# Patient Record
Sex: Female | Born: 1955
Health system: Southern US, Community
[De-identification: ages and names within clinical notes are randomized; demographics above are authoritative.]

## PROBLEM LIST (undated history)

## (undated) DIAGNOSIS — J309 Allergic rhinitis, unspecified: Secondary | ICD-10-CM

## (undated) DIAGNOSIS — E785 Hyperlipidemia, unspecified: Secondary | ICD-10-CM

## (undated) DIAGNOSIS — I213 ST elevation (STEMI) myocardial infarction of unspecified site: Secondary | ICD-10-CM

## (undated) DIAGNOSIS — K219 Gastro-esophageal reflux disease without esophagitis: Secondary | ICD-10-CM

## (undated) DIAGNOSIS — R011 Cardiac murmur, unspecified: Secondary | ICD-10-CM

## (undated) DIAGNOSIS — N390 Urinary tract infection, site not specified: Secondary | ICD-10-CM

## (undated) DIAGNOSIS — I10 Essential (primary) hypertension: Secondary | ICD-10-CM

## (undated) DIAGNOSIS — Z8489 Family history of other specified conditions: Secondary | ICD-10-CM

## (undated) DIAGNOSIS — K760 Fatty (change of) liver, not elsewhere classified: Secondary | ICD-10-CM

## (undated) DIAGNOSIS — E210 Primary hyperparathyroidism: Secondary | ICD-10-CM

## (undated) DIAGNOSIS — J45909 Unspecified asthma, uncomplicated: Secondary | ICD-10-CM

## (undated) DIAGNOSIS — I2510 Atherosclerotic heart disease of native coronary artery without angina pectoris: Secondary | ICD-10-CM

## (undated) DIAGNOSIS — E559 Vitamin D deficiency, unspecified: Secondary | ICD-10-CM

## (undated) DIAGNOSIS — J189 Pneumonia, unspecified organism: Secondary | ICD-10-CM

## (undated) DIAGNOSIS — M1990 Unspecified osteoarthritis, unspecified site: Secondary | ICD-10-CM

## (undated) DIAGNOSIS — Z87442 Personal history of urinary calculi: Secondary | ICD-10-CM

## (undated) HISTORY — DX: Hyperlipidemia, unspecified: E78.5

## (undated) HISTORY — PX: CARDIAC CATHETERIZATION: SHX172

## (undated) HISTORY — PX: TUBAL LIGATION: SHX77

## (undated) HISTORY — PX: UMBILICAL HERNIA REPAIR: SHX196

## (undated) HISTORY — PX: ROOT CANAL: SHX2363

## (undated) HISTORY — PX: CHOLECYSTECTOMY: SHX55

## (undated) HISTORY — DX: Morbid (severe) obesity due to excess calories: E66.01

## (undated) HISTORY — DX: Allergic rhinitis, unspecified: J30.9

## (undated) HISTORY — DX: Fatty (change of) liver, not elsewhere classified: K76.0

## (undated) HISTORY — DX: ST elevation (STEMI) myocardial infarction of unspecified site: I21.3

## (undated) HISTORY — DX: Urinary tract infection, site not specified: N39.0

---

## 1988-04-17 HISTORY — PX: OTHER SURGICAL HISTORY: SHX169

## 2008-03-05 DIAGNOSIS — K760 Fatty (change of) liver, not elsewhere classified: Secondary | ICD-10-CM

## 2008-03-05 HISTORY — DX: Fatty (change of) liver, not elsewhere classified: K76.0

## 2008-04-13 ENCOUNTER — Ambulatory Visit (HOSPITAL_COMMUNITY): Admission: RE | Admit: 2008-04-13 | Discharge: 2008-04-13 | Payer: Self-pay | Admitting: General Surgery

## 2008-04-13 ENCOUNTER — Encounter (INDEPENDENT_AMBULATORY_CARE_PROVIDER_SITE_OTHER): Payer: Self-pay | Admitting: General Surgery

## 2010-08-30 NOTE — Op Note (Signed)
NAME:  Alyssa Phillips, Alyssa Phillips              ACCOUNT NO.:  [id number]   MEDICAL RECORD NO.:  [id number]          PATIENT TYPE:  AMB   LOCATION:  DAY                          FACILITY:  [facility]   PHYSICIAN:  Lennie Muckle, MD      DATE OF BIRTH:  [birth year]   DATE OF PROCEDURE:  04/13/2008  DATE OF DISCHARGE:                               OPERATIVE REPORT   PREOPERATIVE DIAGNOSIS:  Cholelithiasis with history of cholecystitis.   POSTOPERATIVE DIAGNOSIS:  Cholelithiasis with history of cholecystitis.   PROCEDURE:  Laparoscopic cholecystectomy with intraoperative  cholangiogram.   SURGEON:  Amber L. Freida Busman, M.D.   No assistants.   General endotracheal anesthesia.   FINDINGS:  Patent common bile duct, right and left hepatic ducts, flow  went easily into the duodenum.  Gallbladder was sent to pathology.  Minimal amount of blood loss.  No immediate complications.   INDICATIONS FOR PROCEDURE:  Alyssa Phillips is a 55 year old female who was  seen by Dr. Almond Lint due to epigastric right upper quadrant pain.  Symptoms did seem consistent with biliary colic.  She had also had what  sounded like an acute cholecystitis episode.  She was placed on  antibiotics.  Due to Dr. Arita Miss pregnancy and delivery, I was asked to  see the patient for cholecystectomy.  Informed consent was obtained  prior to the procedure.   DETAILS OF THE PROCEDURE:  Alyssa Phillips was identified in the  preoperative holding area.  She received 2 grams of cefoxitin and was  taken to the operating room.  Once in the operating room, placed in  supine position.  After administration of general endotracheal  anesthesia, her abdomen was prepped and draped in the usual sterile  fashion.  Time-out procedure indicating the patient and procedure was  performed.  I placed an incision above the umbilicus.  Using the #10-mm  camera and 11-mm trocar, I placed the camera into the abdominal cavity  using the Optiview.  All layers of the  abdominal wall were visualized  upon entry.  After obtaining adequate pneumoperitoneum, I inspected the  abdominal cavity.  There appeared to be no evidence of injury upon  placement of the trocar.  The patient was placed in reverse  Trendelenburg, right side up.  I placed a trocar at the epigastric  region.  This was placed under visualization with the camera.  Two  additional 5-mm trocars were placed in the right side of the abdomen  under visualization with the camera.  The gallbladder was identified in  normal anatomic position.  There were omental adhesions to the  gallbladder.  This was bluntly dissected with the Maryland forceps as  well as electrocautery.  The gallbladder was grasped at the fundus and  retracted to the head of the patient.  I continued dissecting the  omentum away from the peritoneum.  I was able to fully dissect down in  the infundibulum of the gallbladder.  I identified the cystic node.  I  then carefully dissected peritoneum around the infundibulum and  identified the cystic duct and the cystic artery which was immediately  posterior medial to the cystic duct.  There appeared to be a posterior  branch of the cystic artery as well.  I clipped and divided the cystic  artery to have further length on the cystic duct.  I placed a clip  proximally on the cystic duct and partially transected with laparoscopic  scissors.  I placed a cholangiogram catheter into the cystic duct.  This  was secured with a clip.  Cholangiogram was reviewed and revealed flow  into the duodenum, right and left hepatic ducts, no evidence of  obstruction.  I then clipped the cystic duct proximally and I also  placed an Endoloop PDS around the cystic duct due to the size.  The  remaining peritoneal attachments were divided with electrocautery.  The  gallbladder was placed in an EndoCatch bag and removed through the  umbilical area.  I irrigated the abdomen with 1 liter of saline.  A  minimal  amount of oozing from the peritoneum was controlled with  electrocautery.  Final inspection revealed no bleeding.  Clips were in  place and no evidence of injury.  I closed the fascial defect above the  umbilicus with 0 Vicryl suture using a figure-of-eight fashion.  Palpation revealed good closure.  Pneumoperitoneum was released.  Trocars were removed.  Skin was closed with 4-0 Monocryl.  Dermabond was  placed as final dressing.  The patient was extubated and transferred to  the postanesthesia care unit in stable condition.  She will be  discharged home if her pain is under control and if she is keeping oral  intake.  She will follow up with me in approximately 2-3 weeks.      Lennie Muckle, MD  Electronically Signed     ALA/MEDQ  D:  04/13/2008  T:  04/14/2008  Job:  [id number]   cc:   Almond Lint, MD  [address]  [place] [address]

## 2010-10-16 HISTORY — PX: REPLACEMENT TOTAL KNEE: SUR1224

## 2010-10-25 ENCOUNTER — Other Ambulatory Visit: Payer: Self-pay | Admitting: Orthopedic Surgery

## 2010-10-25 ENCOUNTER — Other Ambulatory Visit (HOSPITAL_COMMUNITY): Payer: Self-pay | Admitting: Orthopedic Surgery

## 2010-10-25 ENCOUNTER — Encounter (HOSPITAL_COMMUNITY): Payer: Managed Care, Other (non HMO)

## 2010-10-25 ENCOUNTER — Ambulatory Visit (HOSPITAL_COMMUNITY)
Admission: RE | Admit: 2010-10-25 | Discharge: 2010-10-25 | Disposition: A | Discharge status: Home / Self Care | Payer: Managed Care, Other (non HMO) | Source: Ambulatory Visit | Attending: Orthopedic Surgery | Admitting: Orthopedic Surgery

## 2010-10-25 DIAGNOSIS — M1710 Unilateral primary osteoarthritis, unspecified knee: Secondary | ICD-10-CM | POA: Insufficient documentation

## 2010-10-25 DIAGNOSIS — IMO0002 Reserved for concepts with insufficient information to code with codable children: Secondary | ICD-10-CM | POA: Insufficient documentation

## 2010-10-25 DIAGNOSIS — J45909 Unspecified asthma, uncomplicated: Secondary | ICD-10-CM | POA: Insufficient documentation

## 2010-10-25 DIAGNOSIS — M1711 Unilateral primary osteoarthritis, right knee: Secondary | ICD-10-CM

## 2010-10-25 DIAGNOSIS — I10 Essential (primary) hypertension: Secondary | ICD-10-CM | POA: Insufficient documentation

## 2010-10-25 DIAGNOSIS — Z01810 Encounter for preprocedural cardiovascular examination: Secondary | ICD-10-CM | POA: Insufficient documentation

## 2010-10-25 DIAGNOSIS — Z01811 Encounter for preprocedural respiratory examination: Secondary | ICD-10-CM | POA: Insufficient documentation

## 2010-10-25 DIAGNOSIS — Z01812 Encounter for preprocedural laboratory examination: Secondary | ICD-10-CM | POA: Insufficient documentation

## 2010-10-25 LAB — COMPREHENSIVE METABOLIC PANEL
AST: 16 U/L (ref 0–37)
Albumin: 4 g/dL (ref 3.5–5.2)
BUN: 16 mg/dL (ref 6–23)
Calcium: 9.5 mg/dL (ref 8.4–10.5)
Creatinine, Ser: 0.65 mg/dL (ref 0.50–1.10)
Total Protein: 7.4 g/dL (ref 6.0–8.3)

## 2010-10-25 LAB — URINE MICROSCOPIC-ADD ON

## 2010-10-25 LAB — CBC
HCT: 45.1 % (ref 36.0–46.0)
MCH: 29.4 pg (ref 26.0–34.0)
MCV: 90.9 fL (ref 78.0–100.0)
Platelets: 250 10*3/uL (ref 150–400)
RDW: 13.3 % (ref 11.5–15.5)
WBC: 7.2 10*3/uL (ref 4.0–10.5)

## 2010-10-25 LAB — URINALYSIS, ROUTINE W REFLEX MICROSCOPIC
Bilirubin Urine: NEGATIVE
Glucose, UA: NEGATIVE mg/dL
Ketones, ur: NEGATIVE mg/dL
Nitrite: NEGATIVE
Specific Gravity, Urine: 1.021 (ref 1.005–1.030)
pH: 6 (ref 5.0–8.0)

## 2010-10-25 LAB — SURGICAL PCR SCREEN
MRSA, PCR: NEGATIVE
Staphylococcus aureus: POSITIVE — AB

## 2010-10-25 LAB — APTT: aPTT: 31 seconds (ref 24–37)

## 2010-10-31 ENCOUNTER — Inpatient Hospital Stay (HOSPITAL_COMMUNITY)
Admission: RE | Admit: 2010-10-31 | Discharge: 2010-11-02 | DRG: 470 | Disposition: A | Discharge status: Home Health | Payer: Managed Care, Other (non HMO) | Source: Ambulatory Visit | Attending: Orthopedic Surgery | Admitting: Orthopedic Surgery

## 2010-10-31 DIAGNOSIS — I10 Essential (primary) hypertension: Secondary | ICD-10-CM | POA: Diagnosis present

## 2010-10-31 DIAGNOSIS — J45909 Unspecified asthma, uncomplicated: Secondary | ICD-10-CM | POA: Diagnosis present

## 2010-10-31 DIAGNOSIS — Z01812 Encounter for preprocedural laboratory examination: Secondary | ICD-10-CM

## 2010-10-31 DIAGNOSIS — M1710 Unilateral primary osteoarthritis, unspecified knee: Principal | ICD-10-CM | POA: Diagnosis present

## 2010-10-31 LAB — ABO/RH: ABO/RH(D): A POS

## 2010-10-31 LAB — TYPE AND SCREEN
ABO/RH(D): A POS
Antibody Screen: NEGATIVE

## 2010-11-01 LAB — CBC
Hemoglobin: 12.7 g/dL (ref 12.0–15.0)
MCH: 30 pg (ref 26.0–34.0)
MCHC: 32.6 g/dL (ref 30.0–36.0)
Platelets: 237 10*3/uL (ref 150–400)
RDW: 13.6 % (ref 11.5–15.5)

## 2010-11-01 LAB — BASIC METABOLIC PANEL
Calcium: 8.8 mg/dL (ref 8.4–10.5)
GFR calc Af Amer: >60 mL/min (ref >60)
GFR calc non Af Amer: >60 mL/min (ref >60)
Glucose, Bld: 118 mg/dL — ABNORMAL HIGH (ref 70–99)
Sodium: 135 mEq/L (ref 135–145)

## 2010-11-02 LAB — BASIC METABOLIC PANEL
CO2: 27 mEq/L (ref 19–32)
Calcium: 9.2 mg/dL (ref 8.4–10.5)
GFR calc non Af Amer: >60 mL/min (ref >60)
Potassium: 3.7 mEq/L (ref 3.5–5.1)
Sodium: 131 mEq/L — ABNORMAL LOW (ref 135–145)

## 2010-11-02 LAB — CBC
MCH: 29.9 pg (ref 26.0–34.0)
Platelets: 215 10*3/uL (ref 150–400)
RBC: 4.11 MIL/uL (ref 3.87–5.11)

## 2010-11-04 NOTE — H&P (Signed)
NAME:  Alyssa Phillips, Alyssa Phillips              ACCOUNT NO.:  [id number]  MEDICAL RECORD NO.:  [id number]  LOCATION:                                 FACILITY:  PHYSICIAN:  Ollen Gross, M.D.    DATE OF BIRTH:  Jun 03, 1955  DATE OF ADMISSION:  10/31/2010 DATE OF DISCHARGE:                             HISTORY & PHYSICAL   CHIEF COMPLAINT:  Right knee pain.  HISTORY OF PRESENT ILLNESS:  The patient is a 55 year old female who has been seen by Dr. Lequita Halt for ongoing right knee pain.  She actually has bilateral knee arthritis, but the right is more problematic and symptomatic.  She has been treated in the past with injections including cortisone.  The right knee continues to be progressive in nature.  It is felt she would benefit from undergoing surgical intervention.  Risks and benefits have been discussed.  She elected to proceed with surgery.  She has been seen preoperatively by Dr. Foy Guadalajara and felt to be stable for surgery, did recommend monitoring her for exacerbations of her underlying hypertension and/or her asthma.  ALLERGIES/INTOLERANCES: 1. ADVIL causes hives. 2. ASPIRIN causes GI upset. 3. COMPAZINE caused her to "eyes rolled in the back of my head".  CURRENT MEDICATIONS:  Vitamin D, Tylenol, and seasonal allergy medication.  PAST MEDICAL HISTORY: 1. Asthma. 2. Past history of bronchitis. 3. Past history of pneumonia x1. 4. Hypertension. 5. Mitral valve prolapse. 6. Hiatal hernia. 7. Chronic allergies. 8. Hemorrhoids. 9. Varicose veins. 10.Urinary incontinence. 11.Osteoarthritis. 12.Postmenopausal.  PAST SURGICAL HISTORY:  Knee arthroscopy x3, gallbladder surgery, right small finger surgery, hernia repair, tubal ligation.  FAMILY HISTORY:  Father living at 31, mother living at 12, and 4 sisters all in good health.  SOCIAL HISTORY:  Married, Associate Professor.  Nonsmoker.  One glass of wine, possibly 1-2 times a month.  She does have caregivers lined up. She has 10  steps entering her home.  She does have a living will.  REVIEW OF SYSTEMS:  GENERAL:  No fever, chills, night sweats. NEUROLOGIC:  No seizures, syncope, or paralysis.  RESPIRATORY:  She gets little shortness of breath on exertion, but no shortness of breath at rest.  She does have some chronic allergies.  CARDIOVASCULAR:  No chest pain, orthopnea.  GI:  Little bit of constipation.  No nausea, vomiting, diarrhea.  GU:  Little bit of frequency, nocturia.  She does have some incontinence.  No dysuria, hematuria.  MUSCULOSKELETAL:  Knee pain.  PHYSICAL EXAMINATION:  VITAL SIGNS:  Pulse 84, respirations 14, blood pressure 158/82. GENERAL:  A 55 year old white female well nourished, well developed, overweight, obese; alert, oriented, cooperative, pleasant.  HEENT: Normocephalic, atraumatic.  Pupils round and reactive.  EOMs intact. NECK:  Supple.  No carotid bruits. CHEST:  Clear anterior and posterior chest wall.  No rhonchi, rales, or wheezing. HEART:  Regular rate and rhythm without murmur, S1 and S2 noted. ABDOMEN:  Soft, protuberant abdomen, bowel sounds present. RECTAL/BREAST/GENITALIA:  Not done, not pertinent to present illness. EXTREMITIES:  Right knee medial joint line tenderness.  There is 20 degrees, motor function intact.  Left knee, marked crepitus, no instability, no effusion.  IMPRESSION:  Osteoarthritis, right knee greater  than left knee.  PLAN:  The patient will be admitted to [facility] to undergo a right total knee replacement arthroplasty.  Surgery will be performed by Dr. Ollen Gross.     Alexzandrew L. Julien Girt, P.A.C.   ______________________________ Ollen Gross, M.D.    ALP/MEDQ  D:  10/30/2010  T:  10/31/2010  Job:  [id number]  cc:   Dr. Lenard Forth L. Foy Guadalajara, M.D. Fax: [phone]  Electronically Signed by Patrica Duel P.A.C. on 11/02/2010 10:44:17 AM Electronically Signed by Ollen Gross M.D. on 11/04/2010 06:04:42 PM

## 2010-11-04 NOTE — Op Note (Signed)
NAME:  Alyssa Phillips, Alyssa Phillips              ACCOUNT NO.:  [id number]  MEDICAL RECORD NO.:  [id number]  LOCATION:  0004                         FACILITY:  [facility]  PHYSICIAN:  Ollen Gross, M.D.    DATE OF BIRTH:  10-Mar-1956  DATE OF PROCEDURE:  10/31/2010 DATE OF DISCHARGE:                              OPERATIVE REPORT   PREOPERATIVE DIAGNOSIS:  Osteoarthritis, right knee.  POSTOPERATIVE DIAGNOSIS:  Osteoarthritis, right knee.  PROCEDURE:  Right total knee arthroplasty.  SURGEON:  Ollen Gross, M.D.  ASSISTANT:  Alexzandrew L. Perkins, P.A.C.  ANESTHESIA:  Spinal.  ESTIMATED BLOOD LOSS:  Minimal.  DRAINS:  Hemovac x1.  TOURNIQUET TIME:  39 minutes at 300 mmHg.  COMPLICATIONS:  None.  CONDITION:  Stable to recovery.  BRIEF CLINICAL NOTE:  Alyssa Phillips is a 55 year old female with advanced end- stage arthritis of the right knee with progressively worsening pain and dysfunction.  She has failed nonoperative management and presents now for right total knee arthroplasty.  PROCEDURE IN DETAIL:  After successful administration of spinal anesthetic, a tourniquet is placed high on her right thigh and her right lower extremity prepped and draped in the usual sterile fashion. Extremities wrapped in Esmarch, knee flexed, tourniquet inflated to 300 mmHg.  Midline incision was made with a 10 blade through subcutaneous tissue to the level of the extensor mechanism.  A fresh blade is used to make a medial parapatellar arthrotomy.  Soft tissue over the proximal medial tibia is subperiosteally elevated to the joint line with the knife and into the semimembranosus bursa with a Cobb elevator.  Soft tissue laterally is elevated with attention being paid to avoiding patellar tendon on tibial tubercle.  The patella was everted, knee flexed to 90 degrees and ACL and PCL removed.  Drill was used to create a starting hole in the distal femur.  The canal was thoroughly irrigated.  The 5-degree right valgus  alignment guide was placed and the distal femoral cutting block pinned to remove 11 mm off the distal femur.  Resection is made with an oscillating saw.  The tibia subluxed forward and the menisci are removed.  Extramedullary tibial alignment guide is placed, referencing proximally at the medial aspect of the tibial tubercle and distally along the second metatarsal axis and tibial crest.  Block is pinned to remove 2 mm off the more deficient medial side.  Tibial resection is made with an oscillating saw.  Size 3 is most appropriate tibial component and the proximal tibia is prepared to modular drill and keel punch for the size 3.  Femoral sizing guide is placed and size 3 is the most appropriate femoral component.  The rotation was marked off the epicondylar axis and confirmed by creating rectangular flexion gap at 90 degrees.  Size 3 cutting block is placed in this rotation and the anterior, posterior and chamfer cuts were made.  The intercondylar block is placed and that cut is made.  Trial size 3 posterior stabilized femur was placed.  10-mm posterior stabilized rotating platform insert trial was placed.  With a 10, full extension was achieved with excellent varus-valgus and anterior- posterior balance throughout.  Full range of motion.  The patella was everted and  thickness measured to be 22 mm.  Freehand resection was taken to 12 mm, 38 template is placed, lug holes were drilled, trial patella was placed and it tracks normally.  Osteophytes were removed off the posterior femur with the trial in place.  All trials are removed and the cut bone surfaces are prepared with pulsatile lavage.  Cement was mixed and once ready for implantation, the size 3 mobile bearing tibial tray, size 3 posterior stabilized femur and 38 patella are cemented into place.  The patella was held with a clamp.  Trial 10-mm inserts placed, knee held in full extension and all extruded cement removed.  When  the cement is fully hardened, then the permanent 10-mm posterior stabilized rotating platform insert was placed in the tibial tray.  Wound was copiously irrigated with saline solution and the arthrotomy closed over Hemovac drain with interrupted #1 PDS.  Flexion against gravity is 135 degrees and patella tracks normally.  The tourniquet was then released for a total time of 39 minutes.  Subcu was then closed with interrupted 2-0 Vicryl and subcuticular running 4-0 Monocryl.  The catheter for Marcaine pain pump is placed and pumps initiated.  The incisions cleaned and dried and Steri-Strips and a bulky sterile dressing were applied. She is then placed into a knee immobilizer, awakened and transported to recovery in stable condition.     Ollen Gross, M.D.     FA/MEDQ  D:  10/31/2010  T:  10/31/2010  Job:  [id number]  Electronically Signed by Ollen Gross M.D. on 11/04/2010 06:04:40 PM

## 2010-11-24 NOTE — Discharge Summary (Addendum)
Alyssa Phillips, Alyssa Phillips              ACCOUNT NO.:  [id number]  MEDICAL RECORD NO.:  [id number]  LOCATION:  1618                         FACILITY:  [facility]  PHYSICIAN:  Ollen Gross, M.D.    DATE OF BIRTH:  Sep 15, 1955  DATE OF ADMISSION:  10/31/2010 DATE OF DISCHARGE:  11/02/2010                              DISCHARGE SUMMARY   DATE OF ADMISSION:  October 31, 2010  DATE OF DISCHARGE:  November 02, 2010  ADMITTING DIAGNOSES: 1. Osteoarthritis, right knee greater than left knee. 2. Asthma. 3. History of bronchitis. 4. History of pneumonia. 5. Hypertension. 6. Mitral valve prolapse. 7. Hiatal hernia. 8. Chronic allergies. 9. Hemorrhoids. 10.Varicose veins. 11.Urinary incontinence. 12.Osteoarthritis. 13.Postmenopausal.  DISCHARGE DIAGNOSIS: 1. Osteoarthritis, right knee status post right total knee replacement     arthroplasty. 2. Asthma. 3. History of bronchitis. 4. History of pneumonia. 5. Hypertension. 6. Mitral valve prolapse. 7. Hiatal hernia. 8. Chronic allergies. 9. Hemorrhoids. 10.Varicose veins. 11.Urinary incontinence. 12.Osteoarthritis. 13.Postmenopausal. 14.Hyponatremia.  PROCEDURE:  October 31, 2010, right total knee.  SURGEON:  Ollen Gross, M.D.  ASSISTANT:  Alexzandrew L. Perkins, P.A.C.  ANESTHESIA:  Spinal anesthesia.  TOURNIQUET TIME:  39 minutes.  CONSULTS:  None.  BRIEF HISTORY:  The patient is a 55 year old female with advanced arthritis of the right knee with progressive worsening pain, dysfunction, failed nonoperative management and now presents for total knee arthroplasty.  LABORATORY DATA:  The admission CBC not scanned into the chart but preop hemoglobin was 12.3.  Serial CBCs were followed.  Hemoglobin was stable, 12.3 at time of discharge.  Chem panel on admission, all within normal limits.  Serial BMPs followed, drop in sodium from 135-131.  Admission Chem panel not scanned in the chart, but the follow-up BMET showed a drop in sodium  from 135-131.  Blood group type A+.  HOSPITAL COURSE:  The patient was admitted to the hospital, taken to OR, underwent above-stated procedure without complication.  The patient tolerated the procedure well, later transferred to the recovery room, orthopedic floor.  She had a rough night during surgery or following surgery, had some indigestion, but it was relieved with oral meds. Doing a little bit better on the morning of day one, encouraged to p.m. meds, decent output.  Hemovac drain was pulled.  Hemoglobin looked good. Encouraged incentive spirometer with a history of bronchitis, history of pneumonia in the past.  Started doing better by day two.  Back was sore, but encouraged her to get up out of bed.  Incision looked good.  She actually progressed very well with her therapy and she was seen later that afternoon and since she was meeting her goals, Dr. Lequita Halt saw her and it was decided that she would be discharged at that time.  DISCHARGE/PLAN: 1. Discharge home on 11/02/2010. 2. Discharge diagnoses, please see above. 3. Discharge meds, Robaxin, OxyIR, Xarelto.  Continue Bepreve, Colace,     hydrochlorothiazide, Nasonex Singulair, Symbicort.  And Xopenex.  DIET:  Heart-healthy diet.  ACTIVITY:  Weight-bear as tolerated, total knee protocol.  Home health PT.  FOLLOWUP:  Follow-up 2 weeks.  DISPOSITION:  Home.  CONDITION ON DISCHARGE:  Improved.     Alexzandrew L. Julien Girt, P.A.C.  ______________________________ Ollen Gross, M.D.    ALP/MEDQ  D:  11/17/2010  T:  11/18/2010  Job:  [id number]  cc:   Dr. Lenard Forth L. Foy Guadalajara, M.D. Fax: [phone]  Electronically Signed by Patrica Duel P.A.C. on 11/24/2010 07:40:34 AM Electronically Signed by Ollen Gross M.D. on 11/28/2010 11:35:09 AM

## 2011-01-20 LAB — COMPREHENSIVE METABOLIC PANEL
ALT: 20 U/L (ref 0–35)
AST: 17 U/L (ref 0–37)
Alkaline Phosphatase: 53 U/L (ref 39–117)
CO2: 29 mEq/L (ref 19–32)
Chloride: 106 mEq/L (ref 96–112)
GFR calc Af Amer: >60 mL/min (ref >60)
GFR calc non Af Amer: >60 mL/min (ref >60)
Sodium: 141 mEq/L (ref 135–145)
Total Bilirubin: 0.7 mg/dL (ref 0.3–1.2)

## 2011-01-20 LAB — DIFFERENTIAL
Basophils Absolute: 0.1 10*3/uL (ref 0.0–0.1)
Eosinophils Absolute: 0.2 10*3/uL (ref 0.0–0.7)
Eosinophils Relative: 3 % (ref 0–5)

## 2011-01-20 LAB — CBC
MCV: 88.6 fL (ref 78.0–100.0)
RBC: 4.98 MIL/uL (ref 3.87–5.11)
WBC: 8.1 10*3/uL (ref 4.0–10.5)

## 2011-09-12 ENCOUNTER — Other Ambulatory Visit (HOSPITAL_COMMUNITY)
Admission: RE | Admit: 2011-09-12 | Discharge: 2011-09-12 | Disposition: A | Discharge status: Home / Self Care | Payer: Managed Care, Other (non HMO) | Source: Ambulatory Visit | Attending: Obstetrics and Gynecology | Admitting: Obstetrics and Gynecology

## 2011-09-12 ENCOUNTER — Other Ambulatory Visit: Payer: Self-pay | Admitting: Obstetrics and Gynecology

## 2011-09-12 DIAGNOSIS — Z01419 Encounter for gynecological examination (general) (routine) without abnormal findings: Secondary | ICD-10-CM | POA: Insufficient documentation

## 2011-09-12 DIAGNOSIS — Z1159 Encounter for screening for other viral diseases: Secondary | ICD-10-CM | POA: Insufficient documentation

## 2011-09-20 ENCOUNTER — Other Ambulatory Visit: Payer: Self-pay | Admitting: Obstetrics and Gynecology

## 2011-09-20 DIAGNOSIS — Z1231 Encounter for screening mammogram for malignant neoplasm of breast: Secondary | ICD-10-CM

## 2011-10-02 ENCOUNTER — Ambulatory Visit
Admission: RE | Admit: 2011-10-02 | Discharge: 2011-10-02 | Disposition: A | Discharge status: Home / Self Care | Payer: Managed Care, Other (non HMO) | Source: Ambulatory Visit | Attending: Obstetrics and Gynecology | Admitting: Obstetrics and Gynecology

## 2011-10-02 DIAGNOSIS — Z1231 Encounter for screening mammogram for malignant neoplasm of breast: Secondary | ICD-10-CM

## 2012-04-08 ENCOUNTER — Other Ambulatory Visit: Payer: Self-pay | Admitting: Orthopedic Surgery

## 2012-04-08 MED ORDER — BUPIVACAINE LIPOSOME 1.3 % IJ SUSP: 20.0000 mL | Freq: Once | INTRAMUSCULAR | Status: DC

## 2012-04-08 MED ORDER — DEXAMETHASONE SODIUM PHOSPHATE 10 MG/ML IJ SOLN: 10.0000 mg | Freq: Once | INTRAMUSCULAR | Status: DC

## 2012-04-08 NOTE — Progress Notes (Signed)
Preoperative surgical orders have been place into the Epic hospital system for Alyssa Phillips on 04/08/2012, 1:07 PM  by Patrica Duel for surgery on 05/10/2012.  Preop Total Knee orders including Experal, IV Tylenol, and IV Decadron as long as there are no contraindications to the above medications. Avel Peace, PA-C

## 2012-04-29 ENCOUNTER — Encounter (HOSPITAL_COMMUNITY): Payer: Self-pay | Admitting: Pharmacy Technician

## 2012-05-02 ENCOUNTER — Encounter (HOSPITAL_COMMUNITY)
Admission: RE | Admit: 2012-05-02 | Discharge: 2012-05-02 | Disposition: A | Discharge status: Home / Self Care | Payer: Managed Care, Other (non HMO) | Source: Ambulatory Visit | Attending: Orthopedic Surgery | Admitting: Orthopedic Surgery

## 2012-05-02 ENCOUNTER — Encounter (HOSPITAL_COMMUNITY): Payer: Self-pay

## 2012-05-02 ENCOUNTER — Ambulatory Visit (HOSPITAL_COMMUNITY)
Admission: RE | Admit: 2012-05-02 | Discharge: 2012-05-02 | Disposition: A | Discharge status: Home / Self Care | Payer: Managed Care, Other (non HMO) | Source: Ambulatory Visit | Attending: Orthopedic Surgery | Admitting: Orthopedic Surgery

## 2012-05-02 DIAGNOSIS — Z01810 Encounter for preprocedural cardiovascular examination: Secondary | ICD-10-CM | POA: Insufficient documentation

## 2012-05-02 DIAGNOSIS — Z01812 Encounter for preprocedural laboratory examination: Secondary | ICD-10-CM | POA: Insufficient documentation

## 2012-05-02 DIAGNOSIS — M1710 Unilateral primary osteoarthritis, unspecified knee: Secondary | ICD-10-CM | POA: Insufficient documentation

## 2012-05-02 DIAGNOSIS — J45909 Unspecified asthma, uncomplicated: Secondary | ICD-10-CM | POA: Insufficient documentation

## 2012-05-02 HISTORY — PX: KNEE ARTHROSCOPY: SHX127

## 2012-05-02 HISTORY — DX: Unspecified osteoarthritis, unspecified site: M19.90

## 2012-05-02 HISTORY — DX: Cardiac murmur, unspecified: R01.1

## 2012-05-02 HISTORY — DX: Essential (primary) hypertension: I10

## 2012-05-02 HISTORY — DX: Unspecified asthma, uncomplicated: J45.909

## 2012-05-02 LAB — URINALYSIS, ROUTINE W REFLEX MICROSCOPIC
Bilirubin Urine: NEGATIVE
Glucose, UA: NEGATIVE mg/dL
Hgb urine dipstick: NEGATIVE
Ketones, ur: NEGATIVE mg/dL
Protein, ur: NEGATIVE mg/dL

## 2012-05-02 LAB — CBC
HCT: 45 % (ref 36.0–46.0)
Hemoglobin: 15 g/dL (ref 12.0–15.0)
RBC: 4.98 MIL/uL (ref 3.87–5.11)

## 2012-05-02 LAB — APTT: aPTT: 29 seconds (ref 24–37)

## 2012-05-02 LAB — COMPREHENSIVE METABOLIC PANEL
ALT: 15 U/L (ref 0–35)
Alkaline Phosphatase: 61 U/L (ref 39–117)
CO2: 27 mEq/L (ref 19–32)
GFR calc Af Amer: >90 mL/min (ref >90)
GFR calc non Af Amer: >90 mL/min (ref >90)
Glucose, Bld: 85 mg/dL (ref 70–99)
Potassium: 3.6 mEq/L (ref 3.5–5.1)
Sodium: 139 mEq/L (ref 135–145)
Total Bilirubin: 0.3 mg/dL (ref 0.3–1.2)

## 2012-05-02 LAB — SURGICAL PCR SCREEN: Staphylococcus aureus: POSITIVE — AB

## 2012-05-02 NOTE — Pre-Procedure Instructions (Signed)
1`-16-14 EKG/ CXR done today. 05-02-12 1615 Pt. Notified per phone of Positive Staph aureus PCR screen, will use Mupirocin as directed. W. Kennon Portela

## 2012-05-02 NOTE — Patient Instructions (Addendum)
[address]  05/02/2012   Your procedure is scheduled on: 1-24  -2014  Report to [facility] at  1130      AM.  Call this number if you have problems the morning of surgery: [phone]  Or Presurgical Testing [phone](Adwoa Axe)   Remember: Follow any bowel prep instructions per MD office. For Cpap use: Bring mask and tubing only.   Do not eat food:After Midnight.  May have clear liquids:up to 6 Hours before arrival. Nothing after : 0700 AM  Clear liquids include soda, tea, black coffee, apple or grape juice, broth.  Take these medicines the morning of surgery with A SIP OF WATER: Tylenol. Tramadol. Use and bring Symbicort, Nasonex. Bepreve eye drops.   Do not wear jewelry, make-up or nail polish.  Do not wear lotions, powders, or perfumes. You may wear deodorant.  Do not shave 12 hours prior to first CHG shower(legs and under arms).(face and neck okay.)  Do not bring valuables to the hospital.  Contacts, dentures or bridgework,body piercing,  may not be worn into surgery.  Leave suitcase in the car. After surgery it may be brought to your room.  For patients admitted to the hospital, checkout time is 11:00 AM the day of discharge.   Patients discharged the day of surgery will not be allowed to drive home. Must have responsible person with you x 24 hours once discharged.  Name and phone number of your driver: Miliyah Luper, spouse [phone] cell  Special Instructions: CHG Shower Use Special Wash: see special instructions.(avoid face and genitals)   Please read over the following fact sheets that you were given: MRSA Information, Blood Transfusion fact sheet, Incentive Spirometry Instruction.    Failure to follow these instructions may result in Cancellation of your surgery.   Patient signature_______________________________________________________

## 2012-05-09 ENCOUNTER — Other Ambulatory Visit: Payer: Self-pay | Admitting: Orthopedic Surgery

## 2012-05-09 NOTE — H&P (Signed)
Alyssa Phillips  DOB: 06-Feb-1956 Married / Language: English / Race: White Female  Date of Admission:  05/10/2012  Chief Complaint:  Left Knee Pain  History of Present Illness The patient is a 57 year old female who comes in for a preoperative History and Physical. The patient is scheduled for a left total knee arthroplasty to be performed by Dr. Gus Rankin. Aluisio, MD at [facility] on 05/10/2012. The patient is a 57 year old female who presents for follow up of their knee. The patient is being followed for their left knee pain. They are out from a cortisone injection. Symptoms reported today include: pain (on the anterior side. She also has burning.). The patient feels that they are doing poorly (Patient did not get any relief from the injection. The patient states the left knee is getting progressively worse.The right knee continues to do great. She is not having any problems with that unless she bumps it against something. She is now ready to get the knee replaced. They have been treated conservatively in the past for the above stated problem and despite conservative measures, they continue to have progressive pain and severe functional limitations and dysfunction. They have failed non-operative management including home exercise, medications, and injections. It is felt that they would benefit from undergoing total joint replacement. Risks and benefits of the procedure have been discussed with the patient and they elect to proceed with surgery. There are no active contraindications to surgery such as ongoing infection or rapidly progressive neurological disease.  Problem List Primary osteoarthritis of one knee (715.16) S/P Right total knee arthroplasty (V43.65)   Allergies COMPAZINE. 02/02/2009 Aspirin Low Dose *ANALGESICS - NonNarcotic*. GI Upset Advil *ANALGESICS - ANTI-INFLAMMATORY*. Hives.   Family History Osteoarthritis. grandmother mothers side and  grandfather mothers side Hypertension. mother Severe allergy. child Cerebrovascular Accident. grandfather mothers side Cancer. mother, grandmother mothers side and grandfather mothers side Diabetes Mellitus. child Heart Disease. grandfather mothers side Drug / Alcohol Addiction. father Father. Living, Dementia. age 69 Mother. Living, In stable health. 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 married Number of flights of stairs before winded. 2-3 Previously in rehab. no Pain Contract. no Drug/Alcohol Rehab (Previously). no Drug/Alcohol Rehab (Currently). no Exercise. Exercises daily; does running / walking, other and gym / weights Living situation. live with spouse Illicit drug use. no Tobacco / smoke exposure. no Tobacco use. Never smoker. never smoker Current work status. working full time Children. 3 Alcohol use. current drinker; drinks wine; only occasionally per week Post-Surgical Plans. Plan is to go home.   Medication History Montelukast Sodium ( Oral) Specific dose unknown - Active. TraMADol HCl (50MG  Tablet, Oral) Active. Methocarbamol ( Oral) Specific dose unknown - Active. Tylenol Extra Strength (500MG  Tablet, Oral) Active. Hydrochlorothiazide ( Oral) Specific dose unknown - Active. Vitamin D (50000UNIT Capsule, Oral) Active. Symbicort ( Inhalation) Specific dose unknown - Active. Nasonex (50MCG/ACT Suspension, Nasal) Active. ZyrTEC Childrens Allergy (5MG  Tablet Chewable, Oral) Active.   Past Surgical History Arthroscopy of Knee. bilateral Inguinal Hernia Repair. laparoscopic: left Gallbladder Surgery. laporoscopic Tubal Ligation Total Knee Replacement. 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 Past History Hiatal Hernia Hemorrhoids Varicose veins Urinary  Incontinence Menopause Mitral Valve Prolapse   Review of Systems General:Not Present- Chills, Fever, Night Sweats, Fatigue, Weight Gain, Weight Loss and Memory Loss. Skin:Not Present- Hives, Itching, Rash, Eczema and Lesions. HEENT:Not  Present- Tinnitus, Headache, Double Vision, Visual Loss, Hearing Loss and Dentures. Respiratory:Not Present- Shortness of breath with exertion, Shortness of breath at rest, Allergies, Coughing up blood and Chronic Cough. Cardiovascular:Not Present- Chest Pain, Racing/skipping heartbeats, Difficulty Breathing Lying Down, Murmur, Swelling and Palpitations. Gastrointestinal:Not Present- Bloody Stool, Heartburn, Abdominal Pain, Vomiting, Nausea, Constipation, Diarrhea, Difficulty Swallowing, Jaundice and Loss of appetitie. Female Genitourinary:Present- Urinating at Night. Not Present- Blood in Urine, Urinary frequency, Weak urinary stream, Discharge, Flank Pain, Incontinence, Painful Urination, Urgency and Urinary Retention. Musculoskeletal:Present- Joint Pain. Not Present- Muscle Weakness, Muscle Pain, Joint Swelling, Back Pain, Morning Stiffness and Spasms. Neurological:Not Present- Tremor, Dizziness, Blackout spells, Paralysis, Difficulty with balance and Weakness. Psychiatric:Not Present- Insomnia.   Vitals Weight: 300 lb Height: 69 in Weight was reported by patient. Height was reported by patient. Body Surface Area: 2.57 m Body Mass Index: 44.3 kg/m Pulse: 68 (Regular) Resp.: 16 (Unlabored) BP: 132/84 (Sitting, Left Arm, Standard)   Physical Exam The physical exam findings are as follows:  Note: Patient is a a 57 year old female with continued knee pain.   General Mental Status - Alert, cooperative and good historian. General Appearance- pleasant. Not in acute distress. Orientation- Oriented X3. Build & Nutrition- Well nourished and Well developed.   Head and Neck Head- normocephalic, atraumatic . Neck Global  Assessment- supple. no bruit auscultated on the right and no bruit auscultated on the left.   Eye Pupil- Bilateral- Regular and Round. Motion- Bilateral- EOMI.   Chest and Lung Exam Auscultation: Breath sounds:- clear at anterior chest wall and - clear at posterior chest wall. Adventitious sounds:- No Adventitious sounds.   Cardiovascular Auscultation:Rhythm- Regular rate and rhythm. Heart Sounds- S1 WNL and S2 WNL. Murmurs & Other Heart Sounds:Auscultation of the heart reveals - No Murmurs.   Abdomen Inspection:Contour- Generalized moderate distention. Palpation/Percussion:Tenderness- Abdomen is non-tender to palpation. Rigidity (guarding)- Abdomen is soft. Auscultation:Auscultation of the abdomen reveals - Bowel sounds normal.   Female Genitourinary Not done, not pertinent to present illness  Musculoskeletal  On exam well developed female, alert and oriented in no apparent distress. Evaluation of her left knee shows range about 5 to 125. Marked crepitus on range of motion. Tender medial greater than lateral. No instability. The right knee looks fantastic. Range 0 to 130. No swelling, tenderness, or instability.  RADIOGRAPHS: Radiographs show the right total knee is in excellent position. No periprosthetic abnormalities. The left knee shows bone on bone arthritis of the medial and patellofemoral compartments.  Assessment & Plan Primary osteoarthritis of one knee (715.16) Impression: Left Knee  Note: Plan is for a Left Total Knee Replacement by Dr. Lequita Halt.  Plan is to go home.  PCP - Dr. Marinda Elk - Patient has been seen preoperatively and felt to be stable for surgery. Preop clearance letter stated that airway management my be complicated by her allergies.  Signed electronically by Roberts Gaudy, PA-C

## 2012-05-10 ENCOUNTER — Encounter (HOSPITAL_COMMUNITY)
Admission: RE | Disposition: A | Discharge status: Home Health | Payer: Self-pay | Source: Ambulatory Visit | Attending: Orthopedic Surgery

## 2012-05-10 ENCOUNTER — Inpatient Hospital Stay (HOSPITAL_COMMUNITY)
Admission: RE | Admit: 2012-05-10 | Discharge: 2012-05-12 | DRG: 470 | Disposition: A | Discharge status: Home Health | Payer: Managed Care, Other (non HMO) | Source: Ambulatory Visit | Attending: Orthopedic Surgery | Admitting: Orthopedic Surgery

## 2012-05-10 ENCOUNTER — Encounter (HOSPITAL_COMMUNITY): Payer: Self-pay | Admitting: Anesthesiology

## 2012-05-10 ENCOUNTER — Ambulatory Visit (HOSPITAL_COMMUNITY): Payer: Managed Care, Other (non HMO) | Admitting: Anesthesiology

## 2012-05-10 ENCOUNTER — Encounter (HOSPITAL_COMMUNITY): Payer: Self-pay | Admitting: *Deleted

## 2012-05-10 DIAGNOSIS — Z8719 Personal history of other diseases of the digestive system: Secondary | ICD-10-CM

## 2012-05-10 DIAGNOSIS — Z886 Allergy status to analgesic agent status: Secondary | ICD-10-CM

## 2012-05-10 DIAGNOSIS — Z96659 Presence of unspecified artificial knee joint: Secondary | ICD-10-CM

## 2012-05-10 DIAGNOSIS — M1710 Unilateral primary osteoarthritis, unspecified knee: Principal | ICD-10-CM | POA: Diagnosis present

## 2012-05-10 DIAGNOSIS — Z6841 Body Mass Index (BMI) 40.0 and over, adult: Secondary | ICD-10-CM

## 2012-05-10 DIAGNOSIS — Z888 Allergy status to other drugs, medicaments and biological substances status: Secondary | ICD-10-CM

## 2012-05-10 DIAGNOSIS — IMO0002 Reserved for concepts with insufficient information to code with codable children: Secondary | ICD-10-CM | POA: Diagnosis present

## 2012-05-10 DIAGNOSIS — J45909 Unspecified asthma, uncomplicated: Secondary | ICD-10-CM | POA: Diagnosis present

## 2012-05-10 DIAGNOSIS — I059 Rheumatic mitral valve disease, unspecified: Secondary | ICD-10-CM | POA: Diagnosis present

## 2012-05-10 DIAGNOSIS — Z9089 Acquired absence of other organs: Secondary | ICD-10-CM

## 2012-05-10 DIAGNOSIS — K449 Diaphragmatic hernia without obstruction or gangrene: Secondary | ICD-10-CM | POA: Diagnosis present

## 2012-05-10 DIAGNOSIS — I10 Essential (primary) hypertension: Secondary | ICD-10-CM | POA: Diagnosis present

## 2012-05-10 HISTORY — PX: TOTAL KNEE ARTHROPLASTY: SHX125

## 2012-05-10 LAB — TYPE AND SCREEN: ABO/RH(D): A POS

## 2012-05-10 SURGERY — ARTHROPLASTY, KNEE, TOTAL
Anesthesia: Spinal | Site: Knee | Laterality: Left | Wound class: Clean

## 2012-05-10 MED ORDER — ACETAMINOPHEN 10 MG/ML IV SOLN
1000.0000 mg | Freq: Four times a day (QID) | INTRAVENOUS | Status: AC
  Administered 2012-05-10 – 2012-05-11 (×4): 1000 mg via INTRAVENOUS
  Filled 2012-05-10 (×6): qty 100

## 2012-05-10 MED ORDER — BISACODYL 10 MG RE SUPP: 10.0000 mg | Freq: Every day | RECTAL | Status: DC | PRN

## 2012-05-10 MED ORDER — ACETAMINOPHEN 10 MG/ML IV SOLN
INTRAVENOUS | Status: AC
  Filled 2012-05-10: qty 100

## 2012-05-10 MED ORDER — HYDROCHLOROTHIAZIDE 25 MG PO TABS
25.0000 mg | ORAL_TABLET | Freq: Every day | ORAL | Status: DC
  Administered 2012-05-10 – 2012-05-12 (×3): 25 mg via ORAL
  Filled 2012-05-10 (×3): qty 1

## 2012-05-10 MED ORDER — DOCUSATE SODIUM 100 MG PO CAPS
100.0000 mg | ORAL_CAPSULE | Freq: Two times a day (BID) | ORAL | Status: DC
  Administered 2012-05-10 – 2012-05-12 (×4): 100 mg via ORAL

## 2012-05-10 MED ORDER — PHENOL 1.4 % MT LIQD: 1.0000 | OROMUCOSAL | Status: DC | PRN

## 2012-05-10 MED ORDER — BUPIVACAINE LIPOSOME 1.3 % IJ SUSP
20.0000 mL | Freq: Once | INTRAMUSCULAR | Status: DC
  Filled 2012-05-10: qty 20

## 2012-05-10 MED ORDER — ACETAMINOPHEN 325 MG PO TABS: 650.0000 mg | ORAL_TABLET | Freq: Four times a day (QID) | ORAL | Status: DC | PRN

## 2012-05-10 MED ORDER — METOCLOPRAMIDE HCL 5 MG/ML IJ SOLN: 5.0000 mg | Freq: Three times a day (TID) | INTRAMUSCULAR | Status: DC | PRN

## 2012-05-10 MED ORDER — FLUTICASONE PROPIONATE 50 MCG/ACT NA SUSP
2.0000 | Freq: Every day | NASAL | Status: DC
  Administered 2012-05-11 – 2012-05-12 (×2): 2 via NASAL
  Filled 2012-05-10: qty 16

## 2012-05-10 MED ORDER — LACTATED RINGERS IV SOLN
INTRAVENOUS | Status: DC
  Administered 2012-05-10: 15:00:00 via INTRAVENOUS
  Administered 2012-05-10: 1000 mL via INTRAVENOUS
  Administered 2012-05-10: 13:00:00 via INTRAVENOUS

## 2012-05-10 MED ORDER — TRAMADOL HCL 50 MG PO TABS: 50.0000 mg | ORAL_TABLET | Freq: Four times a day (QID) | ORAL | Status: DC | PRN

## 2012-05-10 MED ORDER — SODIUM CHLORIDE 0.9 % IJ SOLN
INTRAMUSCULAR | Status: DC | PRN
  Administered 2012-05-10: 14:00:00

## 2012-05-10 MED ORDER — LORATADINE 10 MG PO TABS
10.0000 mg | ORAL_TABLET | Freq: Every day | ORAL | Status: DC
  Administered 2012-05-11: 10 mg via ORAL
  Filled 2012-05-10 (×2): qty 1

## 2012-05-10 MED ORDER — PHENYLEPHRINE HCL 10 MG/ML IJ SOLN
10.0000 mg | INTRAVENOUS | Status: DC | PRN
  Administered 2012-05-10: 15 ug/min via INTRAVENOUS

## 2012-05-10 MED ORDER — DEXAMETHASONE SODIUM PHOSPHATE 10 MG/ML IJ SOLN: 10.0000 mg | Freq: Once | INTRAMUSCULAR | Status: AC

## 2012-05-10 MED ORDER — BEPOTASTINE BESILATE 1.5 % OP SOLN: 1.0000 [drp] | Freq: Every day | OPHTHALMIC | Status: DC | PRN

## 2012-05-10 MED ORDER — RIVAROXABAN 10 MG PO TABS
10.0000 mg | ORAL_TABLET | Freq: Every day | ORAL | Status: DC
  Administered 2012-05-11 – 2012-05-12 (×2): 10 mg via ORAL
  Filled 2012-05-10 (×3): qty 1

## 2012-05-10 MED ORDER — FLEET ENEMA 7-19 GM/118ML RE ENEM: 1.0000 | ENEMA | Freq: Once | RECTAL | Status: AC | PRN

## 2012-05-10 MED ORDER — FENTANYL CITRATE 0.05 MG/ML IJ SOLN
INTRAMUSCULAR | Status: DC | PRN
  Administered 2012-05-10: 100 ug via INTRAVENOUS

## 2012-05-10 MED ORDER — DEXTROSE 5 % IV SOLN
3.0000 g | INTRAVENOUS | Status: AC
  Administered 2012-05-10: 3 g via INTRAVENOUS
  Filled 2012-05-10: qty 3000

## 2012-05-10 MED ORDER — ONDANSETRON HCL 4 MG PO TABS: 4.0000 mg | ORAL_TABLET | Freq: Four times a day (QID) | ORAL | Status: DC | PRN

## 2012-05-10 MED ORDER — PROPOFOL 10 MG/ML IV BOLUS
INTRAVENOUS | Status: DC | PRN
  Administered 2012-05-10: 50 mg via INTRAVENOUS
  Administered 2012-05-10: 30 mg via INTRAVENOUS

## 2012-05-10 MED ORDER — LEVOCETIRIZINE DIHYDROCHLORIDE 5 MG PO TABS: 5.0000 mg | ORAL_TABLET | Freq: Every evening | ORAL | Status: DC

## 2012-05-10 MED ORDER — DEXAMETHASONE 6 MG PO TABS
10.0000 mg | ORAL_TABLET | Freq: Once | ORAL | Status: AC
  Administered 2012-05-11: 10 mg via ORAL
  Filled 2012-05-10: qty 1

## 2012-05-10 MED ORDER — CEFAZOLIN SODIUM 1-5 GM-% IV SOLN
INTRAVENOUS | Status: AC
  Filled 2012-05-10: qty 50

## 2012-05-10 MED ORDER — PROPOFOL 10 MG/ML IV EMUL
INTRAVENOUS | Status: DC | PRN
  Administered 2012-05-10: 75 ug/kg/min via INTRAVENOUS

## 2012-05-10 MED ORDER — SODIUM CHLORIDE 0.9 % IV SOLN: INTRAVENOUS | Status: DC

## 2012-05-10 MED ORDER — CHLORHEXIDINE GLUCONATE 4 % EX LIQD: 60.0000 mL | Freq: Once | CUTANEOUS | Status: DC

## 2012-05-10 MED ORDER — METOCLOPRAMIDE HCL 5 MG/ML IJ SOLN: 10.0000 mg | Freq: Once | INTRAMUSCULAR | Status: DC | PRN

## 2012-05-10 MED ORDER — CEFAZOLIN SODIUM-DEXTROSE 2-3 GM-% IV SOLR
INTRAVENOUS | Status: AC
  Filled 2012-05-10: qty 50

## 2012-05-10 MED ORDER — BUDESONIDE-FORMOTEROL FUMARATE 160-4.5 MCG/ACT IN AERO
2.0000 | INHALATION_SPRAY | Freq: Two times a day (BID) | RESPIRATORY_TRACT | Status: DC
  Administered 2012-05-10 – 2012-05-12 (×4): 2 via RESPIRATORY_TRACT
  Filled 2012-05-10: qty 6

## 2012-05-10 MED ORDER — KCL IN DEXTROSE-NACL 20-5-0.9 MEQ/L-%-% IV SOLN
INTRAVENOUS | Status: DC
  Administered 2012-05-10: 20:00:00 via INTRAVENOUS
  Filled 2012-05-10 (×5): qty 1000

## 2012-05-10 MED ORDER — MEPERIDINE HCL 50 MG/ML IJ SOLN: 6.2500 mg | INTRAMUSCULAR | Status: DC | PRN

## 2012-05-10 MED ORDER — METHOCARBAMOL 500 MG PO TABS
500.0000 mg | ORAL_TABLET | Freq: Four times a day (QID) | ORAL | Status: DC | PRN
  Administered 2012-05-11 – 2012-05-12 (×5): 500 mg via ORAL
  Filled 2012-05-10 (×5): qty 1

## 2012-05-10 MED ORDER — ALBUTEROL SULFATE HFA 108 (90 BASE) MCG/ACT IN AERS
INHALATION_SPRAY | RESPIRATORY_TRACT | Status: AC
  Filled 2012-05-10: qty 6.7

## 2012-05-10 MED ORDER — MIDAZOLAM HCL 5 MG/5ML IJ SOLN
INTRAMUSCULAR | Status: DC | PRN
  Administered 2012-05-10: 2 mg via INTRAVENOUS

## 2012-05-10 MED ORDER — HYDROMORPHONE HCL PF 1 MG/ML IJ SOLN: 0.2500 mg | INTRAMUSCULAR | Status: DC | PRN

## 2012-05-10 MED ORDER — OXYCODONE HCL 5 MG PO TABS
5.0000 mg | ORAL_TABLET | ORAL | Status: DC | PRN
Start: 2012-05-10 — End: 2012-05-12
  Administered 2012-05-10 (×2): 5 mg via ORAL
  Administered 2012-05-10 – 2012-05-12 (×13): 10 mg via ORAL
  Filled 2012-05-10 (×6): qty 2
  Filled 2012-05-10 (×2): qty 1
  Filled 2012-05-10 (×7): qty 2

## 2012-05-10 MED ORDER — ACETAMINOPHEN 650 MG RE SUPP: 650.0000 mg | Freq: Four times a day (QID) | RECTAL | Status: DC | PRN

## 2012-05-10 MED ORDER — ONDANSETRON HCL 4 MG/2ML IJ SOLN
4.0000 mg | Freq: Four times a day (QID) | INTRAMUSCULAR | Status: DC | PRN
  Administered 2012-05-10 – 2012-05-11 (×2): 4 mg via INTRAVENOUS
  Filled 2012-05-10 (×2): qty 2

## 2012-05-10 MED ORDER — STERILE WATER FOR IRRIGATION IR SOLN
Status: DC | PRN
  Administered 2012-05-10: 3000 mL

## 2012-05-10 MED ORDER — MONTELUKAST SODIUM 10 MG PO TABS
10.0000 mg | ORAL_TABLET | Freq: Every day | ORAL | Status: DC
  Administered 2012-05-10 – 2012-05-11 (×2): 10 mg via ORAL
  Filled 2012-05-10 (×3): qty 1

## 2012-05-10 MED ORDER — DIPHENHYDRAMINE HCL 12.5 MG/5ML PO ELIX: 12.5000 mg | ORAL_SOLUTION | ORAL | Status: DC | PRN

## 2012-05-10 MED ORDER — LACTATED RINGERS IV SOLN: INTRAVENOUS | Status: DC

## 2012-05-10 MED ORDER — 0.9 % SODIUM CHLORIDE (POUR BTL) OPTIME
TOPICAL | Status: DC | PRN
  Administered 2012-05-10: 1000 mL

## 2012-05-10 MED ORDER — MENTHOL 3 MG MT LOZG: 1.0000 | LOZENGE | OROMUCOSAL | Status: DC | PRN

## 2012-05-10 MED ORDER — MORPHINE SULFATE 2 MG/ML IJ SOLN
1.0000 mg | INTRAMUSCULAR | Status: DC | PRN
  Administered 2012-05-10 (×4): 2 mg via INTRAVENOUS
  Administered 2012-05-10: 1 mg via INTRAVENOUS
  Administered 2012-05-10 – 2012-05-11 (×7): 2 mg via INTRAVENOUS
  Filled 2012-05-10 (×12): qty 1

## 2012-05-10 MED ORDER — CEFAZOLIN SODIUM-DEXTROSE 2-3 GM-% IV SOLR
2.0000 g | Freq: Four times a day (QID) | INTRAVENOUS | Status: AC
  Administered 2012-05-10 – 2012-05-11 (×2): 2 g via INTRAVENOUS
  Filled 2012-05-10 (×2): qty 50

## 2012-05-10 MED ORDER — ACETAMINOPHEN 10 MG/ML IV SOLN
1000.0000 mg | Freq: Once | INTRAVENOUS | Status: AC
  Administered 2012-05-10: 1000 mg via INTRAVENOUS

## 2012-05-10 MED ORDER — SODIUM CHLORIDE 0.9 % IR SOLN
Status: DC | PRN
  Administered 2012-05-10: 1000 mL

## 2012-05-10 MED ORDER — METHOCARBAMOL 100 MG/ML IJ SOLN
500.0000 mg | Freq: Four times a day (QID) | INTRAMUSCULAR | Status: DC | PRN
  Administered 2012-05-10 (×2): 500 mg via INTRAVENOUS
  Filled 2012-05-10 (×3): qty 5

## 2012-05-10 MED ORDER — POLYETHYLENE GLYCOL 3350 17 G PO PACK: 17.0000 g | PACK | Freq: Every day | ORAL | Status: DC | PRN

## 2012-05-10 MED ORDER — METOCLOPRAMIDE HCL 10 MG PO TABS: 5.0000 mg | ORAL_TABLET | Freq: Three times a day (TID) | ORAL | Status: DC | PRN

## 2012-05-10 MED ORDER — BUPIVACAINE ON-Q PAIN PUMP (FOR ORDER SET NO CHG)
INJECTION | Status: DC
  Filled 2012-05-10: qty 1

## 2012-05-10 SURGICAL SUPPLY — 56 items
BAG SPEC THK2 15X12 ZIP CLS (MISCELLANEOUS) ×1
BAG ZIPLOCK 12X15 (MISCELLANEOUS) ×2 IMPLANT
BANDAGE ELASTIC 6 VELCRO ST LF (GAUZE/BANDAGES/DRESSINGS) ×2 IMPLANT
BANDAGE ESMARK 6X9 LF (GAUZE/BANDAGES/DRESSINGS) ×1 IMPLANT
BLADE SAG 18X100X1.27 (BLADE) ×2 IMPLANT
BLADE SAW SGTL 11.0X1.19X90.0M (BLADE) ×2 IMPLANT
BNDG CMPR 9X6 STRL LF SNTH (GAUZE/BANDAGES/DRESSINGS) ×1
BNDG ESMARK 6X9 LF (GAUZE/BANDAGES/DRESSINGS) ×2
BOWL SMART MIX CTS (DISPOSABLE) ×2 IMPLANT
CATH KIT ON-Q SILVERSOAK 5 (CATHETERS) ×1 IMPLANT
CATH KIT ON-Q SILVERSOAK 5IN (CATHETERS) ×2 IMPLANT
CEMENT HV SMART SET (Cement) ×3 IMPLANT
CLOTH BEACON ORANGE TIMEOUT ST (SAFETY) ×2 IMPLANT
CUFF TOURN SGL QUICK 34 (TOURNIQUET CUFF) ×2
CUFF TRNQT CYL 34X4X40X1 (TOURNIQUET CUFF) ×1 IMPLANT
DRAPE EXTREMITY T 121X128X90 (DRAPE) ×2 IMPLANT
DRAPE POUCH INSTRU U-SHP 10X18 (DRAPES) ×2 IMPLANT
DRAPE U-SHAPE 47X51 STRL (DRAPES) ×2 IMPLANT
DRSG ADAPTIC 3X8 NADH LF (GAUZE/BANDAGES/DRESSINGS) ×2 IMPLANT
DRSG PAD ABDOMINAL 8X10 ST (GAUZE/BANDAGES/DRESSINGS) ×2 IMPLANT
DURAPREP 26ML APPLICATOR (WOUND CARE) ×2 IMPLANT
ELECT REM PT RETURN 9FT ADLT (ELECTROSURGICAL) ×2
ELECTRODE REM PT RTRN 9FT ADLT (ELECTROSURGICAL) ×1 IMPLANT
EVACUATOR 1/8 PVC DRAIN (DRAIN) ×2 IMPLANT
FACESHIELD LNG OPTICON STERILE (SAFETY) ×10 IMPLANT
GLOVE BIO SURGEON STRL SZ8 (GLOVE) ×2 IMPLANT
GLOVE BIOGEL PI IND STRL 8 (GLOVE) ×2 IMPLANT
GLOVE BIOGEL PI INDICATOR 8 (GLOVE) ×2
GLOVE ECLIPSE 8.0 STRL XLNG CF (GLOVE) ×2 IMPLANT
GLOVE SURG SS PI 6.5 STRL IVOR (GLOVE) ×4 IMPLANT
GOWN STRL NON-REIN LRG LVL3 (GOWN DISPOSABLE) ×4 IMPLANT
GOWN STRL REIN XL XLG (GOWN DISPOSABLE) ×2 IMPLANT
HANDPIECE INTERPULSE COAX TIP (DISPOSABLE) ×2
IMMOBILIZER KNEE 20 (SOFTGOODS) ×2
IMMOBILIZER KNEE 20 THIGH 36 (SOFTGOODS) ×1 IMPLANT
KIT BASIN OR (CUSTOM PROCEDURE TRAY) ×2 IMPLANT
MANIFOLD NEPTUNE II (INSTRUMENTS) ×2 IMPLANT
NS IRRIG 1000ML POUR BTL (IV SOLUTION) ×2 IMPLANT
PACK TOTAL JOINT (CUSTOM PROCEDURE TRAY) ×2 IMPLANT
PAD ABD 7.5X8 STRL (GAUZE/BANDAGES/DRESSINGS) ×2 IMPLANT
PADDING CAST ABS 6INX4YD NS (CAST SUPPLIES) ×1
PADDING CAST ABS COTTON 6X4 NS (CAST SUPPLIES) IMPLANT
PADDING CAST COTTON 6X4 STRL (CAST SUPPLIES) ×6 IMPLANT
POSITIONER SURGICAL ARM (MISCELLANEOUS) ×2 IMPLANT
SET HNDPC FAN SPRY TIP SCT (DISPOSABLE) ×1 IMPLANT
SPONGE GAUZE 4X4 12PLY (GAUZE/BANDAGES/DRESSINGS) ×2 IMPLANT
STRIP CLOSURE SKIN 1/2X4 (GAUZE/BANDAGES/DRESSINGS) ×4 IMPLANT
SUCTION FRAZIER 12FR DISP (SUCTIONS) ×2 IMPLANT
SUT MNCRL AB 4-0 PS2 18 (SUTURE) ×2 IMPLANT
SUT VIC AB 2-0 CT1 27 (SUTURE) ×6
SUT VIC AB 2-0 CT1 TAPERPNT 27 (SUTURE) ×3 IMPLANT
SUT VLOC 180 0 24IN GS25 (SUTURE) ×2 IMPLANT
TOWEL OR 17X26 10 PK STRL BLUE (TOWEL DISPOSABLE) ×4 IMPLANT
TRAY FOLEY CATH 14FRSI W/METER (CATHETERS) ×2 IMPLANT
WATER STERILE IRR 1500ML POUR (IV SOLUTION) ×2 IMPLANT
WRAP KNEE MAXI GEL POST OP (GAUZE/BANDAGES/DRESSINGS) ×4 IMPLANT

## 2012-05-10 NOTE — H&P (View-Only) (Signed)
Alyssa Phillips  DOB: 12/06/1955 Married / Language: English / Race: White Female  Date of Admission:  05/10/2012  Chief Complaint:  Left Knee Pain  History of Present Illness The patient is a 57 year old female who comes in for a preoperative History and Physical. The patient is scheduled for a left total knee arthroplasty to be performed by Dr. Frank V. Aluisio, MD at [facility] on 05/10/2012. The patient is a 57 year old female who presents for follow up of their knee. The patient is being followed for their left knee pain. They are out from a cortisone injection. Symptoms reported today include: pain (on the anterior side. She also has burning.). The patient feels that they are doing poorly (Patient did not get any relief from the injection. The patient states the left knee is getting progressively worse.The right knee continues to do great. She is not having any problems with that unless she bumps it against something. She is now ready to get the knee replaced. They have been treated conservatively in the past for the above stated problem and despite conservative measures, they continue to have progressive pain and severe functional limitations and dysfunction. They have failed non-operative management including home exercise, medications, and injections. It is felt that they would benefit from undergoing total joint replacement. Risks and benefits of the procedure have been discussed with the patient and they elect to proceed with surgery. There are no active contraindications to surgery such as ongoing infection or rapidly progressive neurological disease.  Problem List Primary osteoarthritis of one knee (715.16) S/P Right total knee arthroplasty (V43.65)   Allergies COMPAZINE. 02/02/2009 Aspirin Low Dose *ANALGESICS - NonNarcotic*. GI Upset Advil *ANALGESICS - ANTI-INFLAMMATORY*. Hives.   Family History Osteoarthritis. grandmother mothers side and  grandfather mothers side Hypertension. mother Severe allergy. child Cerebrovascular Accident. grandfather mothers side Cancer. mother, grandmother mothers side and grandfather mothers side Diabetes Mellitus. child Heart Disease. grandfather mothers side Drug / Alcohol Addiction. father Father. Living, Dementia. age 78 Mother. Living, In stable health. 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 married Number of flights of stairs before winded. 2-3 Previously in rehab. no Pain Contract. no Drug/Alcohol Rehab (Previously). no Drug/Alcohol Rehab (Currently). no Exercise. Exercises daily; does running / walking, other and gym / weights Living situation. live with spouse Illicit drug use. no Tobacco / smoke exposure. no Tobacco use. Never smoker. never smoker Current work status. working full time Children. 3 Alcohol use. current drinker; drinks wine; only occasionally per week Post-Surgical Plans. Plan is to go home.   Medication History Montelukast Sodium ( Oral) Specific dose unknown - Active. TraMADol HCl (50MG Tablet, Oral) Active. Methocarbamol ( Oral) Specific dose unknown - Active. Tylenol Extra Strength (500MG Tablet, Oral) Active. Hydrochlorothiazide ( Oral) Specific dose unknown - Active. Vitamin D (50000UNIT Capsule, Oral) Active. Symbicort ( Inhalation) Specific dose unknown - Active. Nasonex (50MCG/ACT Suspension, Nasal) Active. ZyrTEC Childrens Allergy (5MG Tablet Chewable, Oral) Active.   Past Surgical History Arthroscopy of Knee. bilateral Inguinal Hernia Repair. laparoscopic: left Gallbladder Surgery. laporoscopic Tubal Ligation Total Knee Replacement. 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 Past History Hiatal Hernia Hemorrhoids Varicose veins Urinary  Incontinence Menopause Mitral Valve Prolapse   Review of Systems General:Not Present- Chills, Fever, Night Sweats, Fatigue, Weight Gain, Weight Loss and Memory Loss. Skin:Not Present- Hives, Itching, Rash, Eczema and Lesions. HEENT:Not   Present- Tinnitus, Headache, Double Vision, Visual Loss, Hearing Loss and Dentures. Respiratory:Not Present- Shortness of breath with exertion, Shortness of breath at rest, Allergies, Coughing up blood and Chronic Cough. Cardiovascular:Not Present- Chest Pain, Racing/skipping heartbeats, Difficulty Breathing Lying Down, Murmur, Swelling and Palpitations. Gastrointestinal:Not Present- Bloody Stool, Heartburn, Abdominal Pain, Vomiting, Nausea, Constipation, Diarrhea, Difficulty Swallowing, Jaundice and Loss of appetitie. Female Genitourinary:Present- Urinating at Night. Not Present- Blood in Urine, Urinary frequency, Weak urinary stream, Discharge, Flank Pain, Incontinence, Painful Urination, Urgency and Urinary Retention. Musculoskeletal:Present- Joint Pain. Not Present- Muscle Weakness, Muscle Pain, Joint Swelling, Back Pain, Morning Stiffness and Spasms. Neurological:Not Present- Tremor, Dizziness, Blackout spells, Paralysis, Difficulty with balance and Weakness. Psychiatric:Not Present- Insomnia.   Vitals Weight: 300 lb Height: 69 in Weight was reported by patient. Height was reported by patient. Body Surface Area: 2.57 m Body Mass Index: 44.3 kg/m Pulse: 68 (Regular) Resp.: 16 (Unlabored) BP: 132/84 (Sitting, Left Arm, Standard)   Physical Exam The physical exam findings are as follows:  Note: Patient is a a 57 year old female with continued knee pain.   General Mental Status - Alert, cooperative and good historian. General Appearance- pleasant. Not in acute distress. Orientation- Oriented X3. Build & Nutrition- Well nourished and Well developed.   Head and Neck Head- normocephalic, atraumatic . Neck Global  Assessment- supple. no bruit auscultated on the right and no bruit auscultated on the left.   Eye Pupil- Bilateral- Regular and Round. Motion- Bilateral- EOMI.   Chest and Lung Exam Auscultation: Breath sounds:- clear at anterior chest wall and - clear at posterior chest wall. Adventitious sounds:- No Adventitious sounds.   Cardiovascular Auscultation:Rhythm- Regular rate and rhythm. Heart Sounds- S1 WNL and S2 WNL. Murmurs & Other Heart Sounds:Auscultation of the heart reveals - No Murmurs.   Abdomen Inspection:Contour- Generalized moderate distention. Palpation/Percussion:Tenderness- Abdomen is non-tender to palpation. Rigidity (guarding)- Abdomen is soft. Auscultation:Auscultation of the abdomen reveals - Bowel sounds normal.   Female Genitourinary Not done, not pertinent to present illness  Musculoskeletal  On exam well developed female, alert and oriented in no apparent distress. Evaluation of her left knee shows range about 5 to 125. Marked crepitus on range of motion. Tender medial greater than lateral. No instability. The right knee looks fantastic. Range 0 to 130. No swelling, tenderness, or instability.  RADIOGRAPHS: Radiographs show the right total knee is in excellent position. No periprosthetic abnormalities. The left knee shows bone on bone arthritis of the medial and patellofemoral compartments.  Assessment & Plan Primary osteoarthritis of one knee (715.16) Impression: Left Knee  Note: Plan is for a Left Total Knee Replacement by Dr. Aluisio.  Plan is to go home.  PCP - Dr. Robert Fried - Patient has been seen preoperatively and felt to be stable for surgery. Preop clearance letter stated that airway management my be complicated by her allergies.  Signed electronically by DREW L PERKINS, PA-C  

## 2012-05-10 NOTE — Op Note (Signed)
Pre-operative diagnosis- Osteoarthritis  Left knee(s)  Post-operative diagnosis- Osteoarthritis Left knee(s)  Procedure-  Left Total Knee Arthroplasty  Surgeon- Gus Rankin. Alysa Duca, MD  Assistant- Avel Peace, PA-C   Anesthesia-  Spinal EBL-* No blood loss amount entered *  Drains Hemovac  Tourniquet time- 34 minutes @ 300 mm Hg  Complications- None  Condition-PACU - hemodynamically stable.   Brief Clinical Note  Alyssa Phillips is a 57 y.o. year old female with end stage OA of her left knee with progressively worsening pain and dysfunction. She has constant pain, with activity and at rest and significant functional deficits with difficulties even with ADLs. She has had extensive non-op management including analgesics, injections of cortisone and viscosupplements, and home exercise program, but remains in significant pain with significant dysfunction. Radiographs show bone on bone arthritis medial and patellofemoral. She presents now for left Total Knee Arthroplasty.    Procedure in detail---   The patient is brought into the operating room and positioned supine on the operating table. After successful administration of  Spinal,   a tourniquet is placed high on the  Left thigh(s) and the lower extremity is prepped and draped in the usual sterile fashion. Time out is performed by the operating team and then the  Left lower extremity is wrapped in Esmarch, knee flexed and the tourniquet inflated to 300 mmHg.       A midline incision is made with a ten blade through the subcutaneous tissue to the level of the extensor mechanism. A fresh blade is used to make a medial parapatellar arthrotomy. Soft tissue over the proximal medial tibia is subperiosteally elevated to the joint line with a knife and into the semimembranosus bursa with a Cobb elevator. Soft tissue over the proximal lateral tibia is elevated with attention being paid to avoiding the patellar tendon on the tibial tubercle. The patella  is everted, knee flexed 90 degrees and the ACL and PCL are removed. Findings are bone on bone medial and patellofemoral with large medial osteophytes.        The drill is used to create a starting hole in the distal femur and the canal is thoroughly irrigated with sterile saline to remove the fatty contents. The 5 degree Left  valgus alignment guide is placed into the femoral canal and the distal femoral cutting block is pinned to remove 10 mm off the distal femur. Resection is made with an oscillating saw.      The tibia is subluxed forward and the menisci are removed. The extramedullary alignment guide is placed referencing proximally at the medial aspect of the tibial tubercle and distally along the second metatarsal axis and tibial crest. The block is pinned to remove 2mm off the more deficient meial  side. Resection is made with an oscillating saw. Size 3is the most appropriate size for the tibia and the proximal tibia is prepared with the modular drill and keel punch for that size.      The femoral sizing guide is placed and size 3 is most appropriate. Rotation is marked off the epicondylar axis and confirmed by creating a rectangular flexion gap at 90 degrees. The size 3 cutting block is pinned in this rotation and the anterior, posterior and chamfer cuts are made with the oscillating saw. The intercondylar block is then placed and that cut is made.      Trial size 3 tibial component, trial size 3 posterior stabilized femur and a 15  mm posterior stabilized rotating platform insert  trial is placed. Full extension is achieved with excellent varus/valgus and anterior/posterior balance throughout full range of motion. The patella is everted and thickness measured to be 24  mm. Free hand resection is taken to 14 mm, a 38 template is placed, lug holes are drilled, trial patella is placed, and it tracks normally. Osteophytes are removed off the posterior femur with the trial in place. All trials are removed and  the cut bone surfaces prepared with pulsatile lavage. Cement is mixed and once ready for implantation, the size 3 tibial implant, size  3 posterior stabilized femoral component, and the size 38 patella are cemented in place and the patella is held with the clamp. The trial insert is placed and the knee held in full extension. The Exparel (20 ml mixed with 50 ml saline) is injected into the extensor mechanism, posterior capsule, medial and lateral gutters and subcutaneous tissues.  All extruded cement is removed and once the cement is hard the permanent 15 mm posterior stabilized rotating platform insert is placed into the tibial tray.      The wound is copiously irrigated with saline solution and the extensor mechanism closed over a hemovac drain with #1 PDS suture. The tourniquet is released for a total tourniquet time of 34  minutes. Flexion against gravity is 140 degrees and the patella tracks normally. Subcutaneous tissue is closed with 2.0 vicryl and subcuticular with running 4.0 Monocryl. The incision is cleaned and dried and steri-strips and a bulky sterile dressing are applied. The limb is placed into a knee immobilizer and the patient is awakened and transported to recovery in stable condition.      Please note that a surgical assistant was a medical necessity for this procedure in order to perform it in a safe and expeditious manner. Surgical assistant was necessary to retract the ligaments and vital neurovascular structures to prevent injury to them and also necessary for proper positioning of the limb to allow for anatomic placement of the prosthesis.   Gus Rankin Ledford Goodson, MD    05/10/2012, 2:13 PM

## 2012-05-10 NOTE — Anesthesia Preprocedure Evaluation (Addendum)
Anesthesia Evaluation  Patient identified by MRN, date of birth, ID band Patient awake    Reviewed: Allergy & Precautions, H&P , NPO status , Patient's Chart, lab work & pertinent test results  Airway Mallampati: I TM Distance: >3 FB Neck ROM: Full    Dental No notable dental hx.    Pulmonary asthma ,  breath sounds clear to auscultation  Pulmonary exam normal       Cardiovascular hypertension, Pt. on medications + Valvular Problems/Murmurs MVP Rhythm:Regular Rate:Normal     Neuro/Psych negative neurological ROS  negative psych ROS   GI/Hepatic negative GI ROS, Neg liver ROS,   Endo/Other  Morbid obesity  Renal/GU negative Renal ROS  negative genitourinary   Musculoskeletal negative musculoskeletal ROS (+)   Abdominal   Peds negative pediatric ROS (+)  Hematology negative hematology ROS (+)   Anesthesia Other Findings   Reproductive/Obstetrics negative OB ROS                           Anesthesia Physical Anesthesia Plan  ASA: III  Anesthesia Plan: Spinal   Post-op Pain Management:    Induction:   Airway Management Planned: Simple Face Mask  Additional Equipment:   Intra-op Plan:   Post-operative Plan:   Informed Consent: I have reviewed the patients History and Physical, chart, labs and discussed the procedure including the risks, benefits and alternatives for the proposed anesthesia with the patient or authorized representative who has indicated his/her understanding and acceptance.   Dental advisory given  Plan Discussed with: CRNA  Anesthesia Plan Comments:         Anesthesia Quick Evaluation

## 2012-05-10 NOTE — Transfer of Care (Signed)
Immediate Anesthesia Transfer of Care Note  Patient: Alyssa Phillips  Procedure(s) Performed: Procedure(s) (LRB) with comments: TOTAL KNEE ARTHROPLASTY (Left)  Patient Location: PACU  Anesthesia Type:Spinal  Level of Consciousness: awake, alert , oriented and patient cooperative  Airway & Oxygen Therapy: Patient Spontanous Breathing and Patient connected to face mask oxygen  Post-op Assessment: Report given to PACU RN and Post -op Vital signs reviewed and stable  Post vital signs: stable  Complications: No apparent anesthesia complications  Left side L2, right side moving slightly

## 2012-05-10 NOTE — Interval H&P Note (Signed)
History and Physical Interval Note:  05/10/2012 12:23 PM  Alyssa Phillips  has presented today for surgery, with the diagnosis of oa left knee   The various methods of treatment have been discussed with the patient and family. After consideration of risks, benefits and other options for treatment, the patient has consented to  Procedure(s) (LRB) with comments: TOTAL KNEE ARTHROPLASTY (Left) as a surgical intervention .  The patient's history has been reviewed, patient examined, no change in status, stable for surgery.  I have reviewed the patient's chart and labs.  Questions were answered to the patient's satisfaction.     Loanne Drilling

## 2012-05-10 NOTE — Anesthesia Procedure Notes (Addendum)
Spinal  Patient location during procedure: OR Start time: 05/10/2012 1:10 PM End time: 05/10/2012 1:15 PM Staffing CRNA/Resident: Sincere Liuzzi E Performed by: resident/CRNA  Preanesthetic Checklist Completed: patient identified, site marked, surgical consent, pre-op evaluation, IV checked, risks and benefits discussed and monitors and equipment checked Spinal Block Patient position: sitting Prep: Betadine Patient monitoring: continuous pulse ox Approach: midline Location: L3-4 Injection technique: single-shot Needle Needle gauge: 22 G Additional Notes CSF x 3, negative heme and negative paresthesia. Pt tolerated well Bupivacine .75%. 2cc's. Attempted with 24 sprotte x1 but successful with #22

## 2012-05-10 NOTE — Anesthesia Postprocedure Evaluation (Signed)
  Anesthesia Post-op Note  Patient: Alyssa Phillips  Procedure(s) Performed: Procedure(s) (LRB): TOTAL KNEE ARTHROPLASTY (Left)  Patient Location: PACU  Anesthesia Type: Spinal  Level of Consciousness: awake and alert   Airway and Oxygen Therapy: Patient Spontanous Breathing  Post-op Pain: mild  Post-op Assessment: Post-op Vital signs reviewed, Patient's Cardiovascular Status Stable, Respiratory Function Stable, Patent Airway and No signs of Nausea or vomiting  Last Vitals:  Filed Vitals:   05/10/12 1640  BP: 119/78  Pulse: 66  Temp: 36.8 C  Resp: 16    Post-op Vital Signs: stable   Complications: No apparent anesthesia complications

## 2012-05-10 NOTE — Plan of Care (Signed)
Problem: Consults Goal: Diagnosis- Total Joint Replacement Primary Total Knee     

## 2012-05-11 LAB — BASIC METABOLIC PANEL
BUN: 9 mg/dL (ref 6–23)
Calcium: 8.4 mg/dL (ref 8.4–10.5)
GFR calc Af Amer: >90 mL/min (ref >90)
GFR calc non Af Amer: >90 mL/min (ref >90)
Glucose, Bld: 114 mg/dL — ABNORMAL HIGH (ref 70–99)
Potassium: 3.6 mEq/L (ref 3.5–5.1)
Sodium: 135 mEq/L (ref 135–145)

## 2012-05-11 LAB — CBC
Hemoglobin: 12.7 g/dL (ref 12.0–15.0)
MCH: 29.7 pg (ref 26.0–34.0)
MCHC: 32.2 g/dL (ref 30.0–36.0)
RDW: 13.1 % (ref 11.5–15.5)

## 2012-05-11 MED ORDER — TRAMADOL HCL 50 MG PO TABS: 50.0000 mg | ORAL_TABLET | Freq: Four times a day (QID) | ORAL | Status: DC | PRN

## 2012-05-11 MED ORDER — RIVAROXABAN 10 MG PO TABS: 10.0000 mg | ORAL_TABLET | Freq: Every day | ORAL | Status: DC

## 2012-05-11 MED ORDER — OXYCODONE HCL 5 MG PO TABS: 5.0000 mg | ORAL_TABLET | ORAL | Status: DC | PRN

## 2012-05-11 NOTE — Progress Notes (Signed)
   Subjective: 1 Day Post-Op Procedure(s) (LRB): TOTAL KNEE ARTHROPLASTY (Left) Patient reports pain as moderate. Had significant pain last night knee and lower back but it is improved this AM . We will start therapy today.  Plan is to go Home after hospital stay.  Objective: Vital signs in last 24 hours: Temp:  [97.5 F (36.4 C)-99 F (37.2 C)] 98.6 F (37 C) (01/25 0981) Pulse Rate:  [59-88] 81  (01/25 0638) Resp:  [10-20] 16  (01/25 0638) BP: (112-164)/(65-98) 133/84 mmHg (01/25 0638) SpO2:  [94 %-100 %] 97 % (01/25 1914) Weight:  [315 lb 4.1 oz (143 kg)] 315 lb 4.1 oz (143 kg) (01/25 0203)  Intake/Output from previous day:  Intake/Output Summary (Last 24 hours) at 05/11/12 0818 Last data filed at 05/11/12 0640  Gross per 24 hour  Intake 3692.5 ml  Output   2380 ml  Net 1312.5 ml    Intake/Output this shift:    Labs:  Basename 05/11/12 0502  HGB 12.7    Basename 05/11/12 0502  WBC 8.9  RBC 4.27  HCT 39.5  PLT 229    Basename 05/11/12 0502  NA 135  K 3.6  CL 102  CO2 28  BUN 9  CREATININE 0.67  GLUCOSE 114*  CALCIUM 8.4   No results found for this basename: LABPT:2,INR:2 in the last 72 hours  EXAM General - Patient is Alert, Appropriate and Oriented Extremity - Neurologically intact Neurovascular intact No cellulitis present Compartment soft Dressing - dressing C/D/I Motor Function - intact, moving foot and toes well on exam.  Hemovac pulled without difficulty.  Past Medical History  Diagnosis Date  . Hypertension   . Heart murmur     hx. mitral valve proplapse-mostly asymptomatic  . Asthma     environmental agents induced asthma  . Arthritis     Osteoarthrits-knees-hx. RTKA    Assessment/Plan: 1 Day Post-Op Procedure(s) (LRB): TOTAL KNEE ARTHROPLASTY (Left) Principal Problem:  *OA (osteoarthritis) of knee   Advance diet Up with therapy D/C IV fluids Plan for discharge tomorrow  DVT Prophylaxis - Xarelto Weight-Bearing as  tolerated to left leg No vaccines. D/C O2 and Pulse OX and try on Room Air  Tristyn Pharris V 05/11/2012, 8:18 AM

## 2012-05-11 NOTE — Evaluation (Signed)
Occupational Therapy Evaluation Patient Details Name: Alyssa Phillips MRN: [id number] DOB: Oct 08, 1955 Today's Date: 05/11/2012 Time: 4098-1191 OT Time Calculation (min): 15 min  OT Assessment / Plan / Recommendation Clinical Impression  Pt is recovering from L TKA.  Pt underwent a R TKA a year and a half ago.  Pt to stay on one level at home with assist of her family.  Will not immediately have access to shower.  Educated pt in use of AE for LB ADL.  Pt will rely on family to assist until she is able to perform independently.  Pt is moving well post op day one.  No further OT needs.    OT Assessment  Patient does not need any further OT services    Follow Up Recommendations  No OT follow up;Supervision/Assistance - 24 hour    Barriers to Discharge      Equipment Recommendations  None recommended by OT    Recommendations for Other Services    Frequency       Precautions / Restrictions Precautions Precautions: Knee;Fall Required Braces or Orthoses: Knee Immobilizer - Left Knee Immobilizer - Left: Discontinue once straight leg raise with < 10 degree lag   Pertinent Vitals/Pain     ADL  Eating/Feeding: Independent Where Assessed - Eating/Feeding: Chair Grooming: Wash/dry hands;Min guard Where Assessed - Grooming: Unsupported standing Upper Body Bathing: Set up Where Assessed - Upper Body Bathing: Unsupported sitting Lower Body Bathing: Moderate assistance Where Assessed - Lower Body Bathing: Unsupported sitting;Supported sit to stand Upper Body Dressing: Set up Where Assessed - Upper Body Dressing: Unsupported sitting Lower Body Dressing: Moderate assistance Where Assessed - Lower Body Dressing: Unsupported sitting;Supported sit to stand Toilet Transfer: Minimal assistance Toilet Transfer Method: Sit to stand Toilet Transfer Equipment: Extra wide bedside commode Toileting - Clothing Manipulation and Hygiene: Min guard Where Assessed - Toileting Clothing Manipulation and  Hygiene: Sit to stand from 3-in-1 or toilet Equipment Used: Long-handled shoe horn;Long-handled sponge;Knee Immobilizer;Rolling walker;Reacher;Sock aid Transfers/Ambulation Related to ADLs: min guard assist with RW to and from bathroom ADL Comments: Educated pt in availability and use of AE.      OT Diagnosis:    OT Problem List:   OT Treatment Interventions:     OT Goals    Visit Information  Last OT Received On: 05/11/12 Assistance Needed: +1    Subjective Data  Subjective: "My sister helped me last time I had a knee replacement." Patient Stated Goal: Home with help from her family   Prior Functioning     Home Living Lives With: Spouse Available Help at Discharge: Family Type of Home: House Home Access: Stairs to enter Secretary/administrator of Steps: 6 + 5 Entrance Stairs-Rails: Right Home Layout: Two level;1/2 bath on main level Alternate Level Stairs-Number of Steps: 5 Bathroom Shower/Tub:  (no tub or shower on level in which pt will stay) Bathroom Toilet: Standard Bathroom Accessibility: Yes How Accessible: Accessible via walker Home Adaptive Equipment: Wheelchair - manual;Walker - rolling;Bedside commode/3-in-1 Prior Function Level of Independence: Independent Able to Take Stairs?: Yes 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 for tasks assessed   Mobility Bed Mobility Bed Mobility: Not assessed  Transfers Sit to Stand: 4: Min  assist;With upper extremity assist;From chair/3-in-1 Stand to Sit: 4: Min guard;To chair/3-in-1 Details for Transfer Assistance: cues to place LLE forward prior to sitting.     Shoulder Instructions     Exercise   Balance     End of Session OT - End of Session Activity Tolerance: Patient tolerated treatment well Patient left: in chair;with call bell/phone within reach;with nursing in room  GO     Evern Bio 05/11/2012, 4:08 PM

## 2012-05-11 NOTE — Evaluation (Signed)
Physical Therapy Evaluation Patient Details Name: Alyssa Phillips MRN: [id number] DOB: August 03, 1955 Today's Date: 05/11/2012 Time: 4098-1191 PT Time Calculation (min): 35 min  PT Assessment / Plan / Recommendation Clinical Impression  Pt. is s/p L tka., had had RTK. Pt tolerated well will benefit fromPT. Pt. plans to DC to home tomorrow and should certainly be able to as well as she did first time OOB>    PT Assessment  Patient needs continued PT services    Follow Up Recommendations  Home health PT    Does the patient have the potential to tolerate intense rehabilitation      Barriers to Discharge        Equipment Recommendations  None recommended by PT    Recommendations for Other Services     Frequency 7X/week    Precautions / Restrictions Precautions Precautions: Knee Required Braces or Orthoses: Knee Immobilizer - Left Knee Immobilizer - Left: Discontinue once straight leg raise with < 10 degree lag   Pertinent Vitals/Pain 5/10 pain L knee, had meds.ice applied      Mobility  Bed Mobility Bed Mobility: Supine to Sit;Sit to Supine Supine to Sit: 4: Min assist;With rails;HOB elevated Details for Bed Mobility Assistance: support of LLE to lower tofloor. Transfers Transfers: Sit to Stand;Stand to [employer] Transfers Sit to Stand: 4: Min assist;From bed;With upper extremity assist Stand to Sit: To chair/3-in-1;With upper extremity assist;4: Min assist Stand Pivot Transfers: 3: Mod assist Details for Transfer Assistance: cues to place LLE forwar, reach to recliner/ BSC. pivot bed to [facility]. Ambulation/Gait Ambulation/Gait Assistance: 4: Min assist Ambulation Distance (Feet): 160 Feet Assistive device: Rolling walker Ambulation/Gait Assistance Details: cues for sequence, although pt knows  from previousTK Gait Pattern: Step-to pattern;Decreased stance time - left;Antalgic Gait velocity: decr    Shoulder Instructions     Exercises Total Joint Exercises Ankle  Circles/Pumps: AROM;Left;10 reps;Supine Quad Sets: AROM;Left;10 reps;Supine Heel Slides: AAROM;Left;10 reps;Supine Hip ABduction/ADduction: AAROM;Left;10 reps;Supine Straight Leg Raises: AAROM;Left;Supine   PT Diagnosis: Difficulty walking;Acute pain  PT Problem List: Decreased strength;Decreased range of motion;Decreased activity tolerance;Decreased balance;Decreased mobility;Decreased knowledge of use of DME;Decreased knowledge of precautions;Pain PT Treatment Interventions: DME instruction;Gait training;Stair training;Functional mobility training;Therapeutic activities;Therapeutic exercise;Patient/family education   PT Goals Acute Rehab PT Goals PT Goal Formulation: With patient Time For Goal Achievement: 05/18/12 Potential to Achieve Goals: Good Pt will go Supine/Side to Sit: with supervision Pt will go Sit to Supine/Side: with supervision PT Goal: Sit to Supine/Side - Progress: Goal set today Pt will go Sit to Stand: with supervision PT Goal: Sit to Stand - Progress: Goal set today Pt will go Stand to Sit: with supervision PT Goal: Stand to Sit - Progress: Goal set today Pt will Ambulate: with rolling walker;with supervision PT Goal: Ambulate - Progress: Goal set today Pt will Go Up / Down Stairs: 3-5 stairs;with min assist;with least restrictive assistive device PT Goal: Up/Down Stairs - Progress: Goal set today Pt will Perform Home Exercise Program: with supervision, verbal cues required/provided PT Goal: Perform Home Exercise Program - Progress: Goal set today  Visit Information  Last PT Received On: 05/11/12 Assistance Needed: +2    Subjective Data  Subjective: I want to walk Patient Stated Goal:  to go home tomorrow.   Prior Functioning  Home Living Lives With: Spouse Available Help at Discharge: Family Type of Home: House Home Access: Stairs to enter [employer] of Steps: 6 + 5 Entrance Stairs-Rails: Right Home Layout: Two level;1/2 bath on main  level Alternate  Level Stairs-Number of Steps: 5 Home Adaptive Equipment: Wheelchair - manual;Walker - rolling;Bedside commode/3-in-1 Prior Function Level of Independence: Independent Able to Take Stairs?: Yes Vocation: Full time employment Communication Communication: No difficulties    Cognition  Overall Cognitive Status: Appears within functional limits for tasks assessed/performed    Extremity/Trunk Assessment Right Upper Extremity Assessment RUE ROM/Strength/Tone: [facility] for tasks assessed Left Upper Extremity Assessment LUE ROM/Strength/Tone: WFL for tasks assessed Right Lower Extremity Assessment RLE ROM/Strength/Tone: WFL for tasks assessed RLE Sensation: WFL - Light Touch Left Lower Extremity Assessment LLE ROM/Strength/Tone: Deficits LLE ROM/Strength/Tone Deficits: knee flexed to 40 degrees. SLR with min assist, LLE Sensation: WFL - Light Touch;Deficits LLE Sensation Deficits: states top of foot is painful.   Balance    End of Session PT - End of Session Equipment Utilized During Treatment: Left knee immobilizer Activity Tolerance: Patient tolerated treatment well Patient left: with call bell/phone within reach;with family/visitor present Nurse Communication: Mobility status  GP     Rada Hay 05/11/2012, 3:57 PM

## 2012-05-11 NOTE — Progress Notes (Signed)
Physical Therapy Treatment Patient Details Name: Alyssa Phillips MRN: [id number] DOB: 10-18-1955 Today's Date: 05/11/2012 Time: 1191-4782 PT Time Calculation (min): 24 min  PT Assessment / Plan / Recommendation Comments on Treatment Session  Pt. is improving well. PLan for DC  after practice of steps w/ PT if MD oK's.    Follow Up Recommendations  Home health PT     Does the patient have the potential to tolerate intense rehabilitation     Barriers to Discharge        Equipment Recommendations  None recommended by PT    Recommendations for Other Services    Frequency 7X/week   Plan Discharge plan remains appropriate;Frequency remains appropriate    Precautions / Restrictions Precautions Precautions: Knee Required Braces or Orthoses: Knee Immobilizer - Left Knee Immobilizer - Left: Discontinue once straight leg raise with < 10 degree lag   Pertinent Vitals/Pain 5/10 pain.    Mobility  Bed Mobility Bed Mobility: Supine to Sit;Sit to Supine Sit to Supine: 4: Min assist Details for Bed Mobility Assistance: support LLE onto bed. Transfers Transfers: Sit to Stand;Stand to [employer] Transfers Sit to Stand: 4: Min assist;From bed;With upper extremity assist Stand to Sit: To bed;4: Min guard Stand Pivot Transfers: 3: Mod assist Details for Transfer Assistance: cues to place LLE forward prior to sitting. Ambulation/Gait Ambulation/Gait Assistance: 4: Min guard Ambulation Distance (Feet): 200 Feet Assistive device: Rolling walker Ambulation/Gait Assistance Details: pt is improving, has been up to BR several times. Gait Pattern: Step-through pattern;Antalgic Gait velocity: decr    Exercises Total Joint Exercises Ankle Circles/Pumps: AROM;Left;10 reps;Supine Quad Sets: AROM;Left;10 reps;Supine Heel Slides: AAROM;Left;10 reps;Supine Hip ABduction/ADduction: AAROM;Left;10 reps;Supine Straight Leg Raises: AAROM;Left;Supine   PT Diagnosis: Difficulty walking;Acute  pain  PT Problem List: Decreased strength;Decreased range of motion;Decreased activity tolerance;Decreased balance;Decreased mobility;Decreased knowledge of use of DME;Decreased knowledge of precautions;Pain PT Treatment Interventions: DME instruction;Gait training;Stair training;Functional mobility training;Therapeutic activities;Therapeutic exercise;Patient/family education   PT Goals Acute Rehab PT Goals PT Goal Formulation: With patient Time For Goal Achievement: 05/18/12 Potential to Achieve Goals: Good Pt will go Supine/Side to Sit: with supervision Pt will go Sit to Supine/Side: with supervision PT Goal: Sit to Supine/Side - Progress: Progressing toward goal Pt will go Sit to Stand: with supervision PT Goal: Sit to Stand - Progress: Goal set today Pt will go Stand to Sit: with supervision PT Goal: Stand to Sit - Progress: Progressing toward goal Pt will Ambulate: >150 feet;with supervision;with rolling walker PT Goal: Ambulate - Progress: Progressing toward goal Pt will Go Up / Down Stairs: 3-5 stairs;with min assist;with least restrictive assistive device PT Goal: Up/Down Stairs - Progress: Goal set today Pt will Perform Home Exercise Program: with supervision, verbal cues required/provided PT Goal: Perform Home Exercise Program - Progress: Progressing toward goal  Visit Information  Last PT Received On: 05/11/12 Assistance Needed: +1    Subjective Data  Subjective: I am doing good. Patient Stated Goal:  to go home tomorrow.   Cognition  Overall Cognitive Status: Appears within functional limits for tasks assessed/performed    Balance     End of Session PT - End of Session Equipment Utilized During Treatment: Left knee immobilizer Activity Tolerance: Patient tolerated treatment well Patient left: with call bell/phone within reach;with family/visitor present Nurse Communication: Mobility status   GP     Rada Hay 05/11/2012, 4:02 PM

## 2012-05-12 LAB — BASIC METABOLIC PANEL
Calcium: 9 mg/dL (ref 8.4–10.5)
GFR calc Af Amer: >90 mL/min (ref >90)
GFR calc non Af Amer: >90 mL/min (ref >90)
Potassium: 3.4 mEq/L — ABNORMAL LOW (ref 3.5–5.1)
Sodium: 136 mEq/L (ref 135–145)

## 2012-05-12 LAB — CBC
Hemoglobin: 12.6 g/dL (ref 12.0–15.0)
MCH: 29.5 pg (ref 26.0–34.0)
MCHC: 32.5 g/dL (ref 30.0–36.0)
Platelets: 230 10*3/uL (ref 150–400)

## 2012-05-12 NOTE — Progress Notes (Signed)
Physical Therapy Treatment Patient Details Name: ALEINA BURGIO MRN: [id number] DOB: 04-13-1956 Today's Date: 05/12/2012 Time: 8657-8469 PT Time Calculation (min): 10 min  PT Assessment / Plan / Recommendation Comments on Treatment Session  PA in to remove dressing. Pt. is mobilizing well.    Follow Up Recommendations  Home health PT     Does the patient have the potential to tolerate intense rehabilitation     Barriers to Discharge        Equipment Recommendations  None recommended by PT    Recommendations for Other Services    Frequency 7X/week   Plan Discharge plan remains appropriate;Frequency remains appropriate    Precautions / Restrictions Precautions Precautions: Knee   Pertinent Vitals/Pain    Mobility  Transfers Sit to Stand: 5: Supervision Stand to Sit: To chair/3-in-1;With upper extremity assist;5: Supervision Ambulation/Gait Ambulation/Gait Assistance: 5: Supervision Ambulation Distance (Feet): 20 Feet (x2) Assistive device: Rolling walker Ambulation/Gait Assistance Details: safe with no KI Gait Pattern: Step-through pattern;Antalgic    Exercises    PT Diagnosis:    PT Problem List:   PT Treatment Interventions:     PT Goals Acute Rehab PT Goals Pt will go Sit to Stand: with supervision PT Goal: Sit to Stand - Progress: Met Pt will go Stand to Sit: with supervision PT Goal: Stand to Sit - Progress: Met Pt will Ambulate: >150 feet;with supervision;with rolling walker PT Goal: Ambulate - Progress: Progressing toward goal  Visit Information  Last PT Received On: 05/12/12 Assistance Needed: +1    Subjective Data  Subjective: I am ready to go.   Cognition  Overall Cognitive Status: Appears within functional limits for tasks assessed/performed    Balance     End of Session     GP     Rada Hay 05/12/2012, 3:52 PM

## 2012-05-12 NOTE — Progress Notes (Signed)
Discharged from floor via w/c, spouse with pt. No changes in assessment. Alyssa Phillips   

## 2012-05-12 NOTE — Progress Notes (Addendum)
Pt plan for d/c today and [facility] liaison Donna is in the hospital helping the pt to get set up with disposition needs. No further needs from CM at this time. Thanks Graves-Bigelow, Melicia Esqueda Kaye, RN,BSN [phone]   

## 2012-05-12 NOTE — Plan of Care (Signed)
Problem: Discharge Progression Outcomes Goal: Anticoagulant follow-up in place Outcome: Not Applicable Date Met:  05/12/12 xarelto     

## 2012-05-12 NOTE — Progress Notes (Signed)
Physical Therapy Treatment Patient Details Name: Alyssa Phillips MRN: [id number] DOB: [birth year] Today's Date: 05/12/2012 Time: 4098-1191 PT Time Calculation (min): 19 min  PT Assessment / Plan / Recommendation Comments on Treatment Session  will practice steps when spouse arrives.    Follow Up Recommendations  Home health PT     Does the patient have the potential to tolerate intense rehabilitation     Barriers to Discharge        Equipment Recommendations  None recommended by PT    Recommendations for Other Services    Frequency 7X/week   Plan Discharge plan remains appropriate;Frequency remains appropriate    Precautions / Restrictions Precautions Precautions: Knee   Pertinent Vitals/Pain     Mobility  Transfers Sit to Stand: 5: Supervision Stand to Sit: To chair/3-in-1;With upper extremity assist;5: Supervision Ambulation/Gait Ambulation/Gait Assistance: 5: Supervision Ambulation Distance (Feet): 200 Feet Assistive device: Rolling walker Ambulation/Gait Assistance Details: safe with no KI Gait Pattern: Step-through pattern;Antalgic    Exercises Total Joint Exercises Quad Sets: AROM;10 reps;Left Heel Slides: AROM;Left;10 reps Straight Leg Raises: AAROM;Left;10 reps   PT Diagnosis:    PT Problem List:   PT Treatment Interventions:     PT Goals Acute Rehab PT Goals Pt will go Sit to Stand: with supervision PT Goal: Sit to Stand - Progress: Met Pt will go Stand to Sit: with supervision PT Goal: Stand to Sit - Progress: Met Pt will Ambulate: >150 feet;with supervision;with rolling walker PT Goal: Ambulate - Progress: Progressing toward goal Pt will Perform Home Exercise Program: with supervision, verbal cues required/provided PT Goal: Perform Home Exercise Program - Progress: Progressing toward goal  Visit Information  Last PT Received On: 05/12/12 Assistance Needed: +1    Subjective Data  Subjective: i'll walk   Cognition  Overall Cognitive  Status: Appears within functional limits for tasks assessed/performed    Balance     End of Session PT - End of Session Activity Tolerance: Patient tolerated treatment well Patient left: in chair Nurse Communication: Mobility status   GP     Rada Hay 05/12/2012, 3:55 PM

## 2012-05-12 NOTE — Care Management Note (Signed)
    Page 1 of 1   05/12/2012     12:31:43 PM   CARE MANAGEMENT NOTE 05/12/2012  Patient:  Alyssa Phillips, Alyssa Phillips   Account Number:  [id number]  Date Initiated:  05/12/2012  Documentation initiated by:  GRAVES-BIGELOW,Eladio [place]  Subjective/Objective Assessment:   Pt. is s/p L tka. Plan for d/c today.     Action/Plan:   Pt needs HH PT servcies.   Anticipated DC Date:     Anticipated DC Plan:        DC Planning Services  CM consult      [facility] Choice  HOME HEALTH   Choice offered to / List presented to:  C-1 Patient        HH arranged  HH-2 PT      [facility] agency  [facility]   Status of service:  Completed, signed off Medicare Important Message given?   (If response is "NO", the following Medicare IM given date fields will be blank) Date Medicare IM given:   Date Additional Medicare IM given:    Discharge Disposition:  HOME W HOME HEALTH SERVICES  Per UR Regulation:  Reviewed for med. necessity/level of care/duration of stay  If discussed at Long Length of Stay Meetings, dates discussed:    Comments:  Pt plan for d/c today and [facility] liaison Lupita Leash is in the hospital helping the pt to get set up with disposition needs. No further needs from CM at this time. Thanks Gala Lewandowsky. [phone]

## 2012-05-12 NOTE — Progress Notes (Signed)
Physical Therapy Treatment Patient Details Name: Alyssa Phillips MRN: [id number] DOB: 06-15-1955 Today's Date: 05/12/2012 Time: 1100-1113 PT Time Calculation (min): 13 min  PT Assessment / Plan / Recommendation Comments on Treatment Session  pt is ready for DC    Follow Up Recommendations  Home health PT     Does the patient have the potential to tolerate intense rehabilitation     Barriers to Discharge        Equipment Recommendations  None recommended by PT    Recommendations for Other Services    Frequency 7X/week   Plan Discharge plan remains appropriate;Frequency remains appropriate    Precautions / Restrictions Precautions Precautions: Knee   Pertinent Vitals/Pain     Mobility  Bed Mobility Supine to Sit: 6: Modified independent (Device/Increase time) Sit to Supine: 6: Modified independent (Device/Increase time) Details for Bed Mobility Assistance: pt used sheet Transfers Sit to Stand: 5: Supervision Stand to Sit: To chair/3-in-1;With upper extremity assist;5: Supervision Ambulation/Gait Ambulation/Gait Assistance: 5: Supervision Ambulation Distance (Feet): 50 Feet Assistive device: Rolling walker Ambulation/Gait Assistance Details: safe with no KI Gait Pattern: Step-through pattern;Antalgic Stairs: Yes Stairs Assistance: 4: Min assist Stairs Assistance Details (indicate cue type and reason): crutch and rail. Pt will need KI to negotiate multiple steps at home. Souse aware. Stair Management Technique: One rail Right;With crutches;Forwards Number of Stairs: 2     Exercises Total Joint Exercises Quad Sets: AROM;10 reps;Left Heel Slides: AROM;Left;10 reps Straight Leg Raises: AAROM;Left;10 reps   PT Diagnosis:    PT Problem List:   PT Treatment Interventions:     PT Goals Acute Rehab PT Goals Pt will go Supine/Side to Sit: with supervision PT Goal: Supine/Side to Sit - Progress: Met Pt will go Sit to Supine/Side: with supervision PT Goal: Sit to  Supine/Side - Progress: Met Pt will go Sit to Stand: with supervision PT Goal: Sit to Stand - Progress: Met Pt will go Stand to Sit: with supervision PT Goal: Stand to Sit - Progress: Met Pt will Ambulate: >150 feet;with supervision;with rolling walker PT Goal: Ambulate - Progress: Progressing toward goal Pt will Go Up / Down Stairs: 3-5 stairs;with min assist;with least restrictive assistive device PT Goal: Up/Down Stairs - Progress: Met Pt will Perform Home Exercise Program: with supervision, verbal cues required/provided PT Goal: Perform Home Exercise Program - Progress: Progressing toward goal  Visit Information  Last PT Received On: 05/12/12 Assistance Needed: +1    Subjective Data  Subjective: i'll walk   Cognition  Overall Cognitive Status: Appears within functional limits for tasks assessed/performed    Balance     End of Session PT - End of Session Activity Tolerance: Patient tolerated treatment well Patient left: in chair Nurse Communication: Mobility status   GP     Rada Hay 05/12/2012, 3:58 PM

## 2012-05-12 NOTE — Progress Notes (Signed)
   Subjective: 2 Days Post-Op Procedure(s) (LRB): TOTAL KNEE ARTHROPLASTY (Left)   Patient reports pain as mild, pain well controlled. Doing really well with PT. No events throughout the night. Ready to be discharged home.  Objective:   VITALS:   Filed Vitals:   05/12/12 0800  BP: 128/80  Pulse: 83  Temp: 99.2 F (37.3 C)   Resp: 18    Neurovascular intact Dorsiflexion/Plantar flexion intact Incision: dressing C/D/I No cellulitis present Compartment soft  LABS  Basename 05/12/12 0423 05/11/12 0502  HGB 12.6 12.7  HCT 38.8 39.5  WBC 11.0* 8.9  PLT 230 229     Basename 05/12/12 0423 05/11/12 0502  NA 136 135  K 3.4* 3.6  BUN 10 9  CREATININE 0.71 0.67  GLUCOSE 121* 114*     Assessment/Plan: 2 Days Post-Op Procedure(s) (LRB): TOTAL KNEE ARTHROPLASTY (Left)  Dressing changed Up with therapy Discharge home with home health   Anastasio Auerbach. Orrin Yurkovich   PAC  05/12/2012, 9:12 AM

## 2012-05-13 ENCOUNTER — Encounter (HOSPITAL_COMMUNITY): Payer: Self-pay | Admitting: Orthopedic Surgery

## 2012-05-16 NOTE — Discharge Summary (Signed)
Physician Discharge Summary   Patient ID: Alyssa Phillips MRN: [id number] DOB/AGE: [birth year] 57 y.o.  Admit date: 05/10/2012 Discharge date: 05/12/2012  Primary Diagnosis:  Osteoarthritis Left knee  Admission Diagnoses:  Past Medical History  Diagnosis Date  . Hypertension   . Heart murmur     hx. mitral valve proplapse-mostly asymptomatic  . Asthma     environmental agents induced asthma  . Arthritis     Osteoarthrits-knees-hx. RTKA   Discharge Diagnoses:   Principal Problem:  *OA (osteoarthritis) of knee  Estimated Body mass index is 46.56 kg/(m^2) as calculated from the following:   Height as of this encounter: 5\' 9" (1.753 m).   Weight as of this encounter: 315 lb 4.1 oz(143 kg).  Classification of overweight in adults according to BMI (WHO, 1998)   Procedure:  Procedure(s) (LRB): TOTAL KNEE ARTHROPLASTY (Left)   Consults: None  HPI: Alyssa Phillips is a 57 y.o. year old female with end stage OA of her left knee with progressively worsening pain and dysfunction. She has constant pain, with activity and at rest and significant functional deficits with difficulties even with ADLs. She has had extensive non-op management including analgesics, injections of cortisone and viscosupplements, and home exercise program, but remains in significant pain with significant dysfunction. Radiographs show bone on bone arthritis medial and patellofemoral. She presents now for left Total Knee Arthroplasty.   Laboratory Data: Admission on 05/10/2012, Discharged on 05/12/2012  Component Date Value Range Status  . ABO/RH(D) 05/10/2012 A POS   Final  . Antibody Screen 05/10/2012 NEG   Final  . Sample Expiration 05/10/2012 05/13/2012   Final  . WBC 05/11/2012 8.9  4.0 - 10.5 K/uL Final  . RBC 05/11/2012 4.27  3.87 - 5.11 MIL/uL Final  . Hemoglobin 05/11/2012 12.7  12.0 - 15.0 g/dL Final  . HCT 40/98/1191 39.5  36.0 - 46.0 % Final  . MCV 05/11/2012 92.5  78.0 - 100.0 fL Final  . MCH  05/11/2012 29.7  26.0 - 34.0 pg Final  . MCHC 05/11/2012 32.2  30.0 - 36.0 g/dL Final  . RDW 47/82/9562 13.1  11.5 - 15.5 % Final  . Platelets 05/11/2012 229  150 - 400 K/uL Final  . Sodium 05/11/2012 135  135 - 145 mEq/L Final  . Potassium 05/11/2012 3.6  3.5 - 5.1 mEq/L Final  . Chloride 05/11/2012 102  96 - 112 mEq/L Final  . CO2 05/11/2012 28  19 - 32 mEq/L Final  . Glucose, Bld 05/11/2012 114* 70 - 99 mg/dL Final  . BUN 13/11/6576 9  6 - 23 mg/dL Final  . Creatinine, Ser 05/11/2012 0.67  0.50 - 1.10 mg/dL Final  . Calcium 46/96/2952 8.4  8.4 - 10.5 mg/dL Final  . GFR calc non Af Amer 05/11/2012 >90  >90 mL/min Final  . GFR calc Af Amer 05/11/2012 >90  >90 mL/min Final   Comment:                                 The eGFR has been calculated                          using the CKD EPI equation.                          This calculation has not been  validated in all clinical                          situations.                          eGFR's persistently                          <90 mL/min signify                          possible Chronic Kidney Disease.  . WBC 05/12/2012 11.0* 4.0 - 10.5 K/uL Final  . RBC 05/12/2012 4.27  3.87 - 5.11 MIL/uL Final  . Hemoglobin 05/12/2012 12.6  12.0 - 15.0 g/dL Final  . HCT 84/69/6295 38.8  36.0 - 46.0 % Final  . MCV 05/12/2012 90.9  78.0 - 100.0 fL Final  . MCH 05/12/2012 29.5  26.0 - 34.0 pg Final  . MCHC 05/12/2012 32.5  30.0 - 36.0 g/dL Final  . RDW 28/41/3244 12.6  11.5 - 15.5 % Final  . Platelets 05/12/2012 230  150 - 400 K/uL Final  . Sodium 05/12/2012 136  135 - 145 mEq/L Final  . Potassium 05/12/2012 3.4* 3.5 - 5.1 mEq/L Final  . Chloride 05/12/2012 100  96 - 112 mEq/L Final  . CO2 05/12/2012 27  19 - 32 mEq/L Final  . Glucose, Bld 05/12/2012 121* 70 - 99 mg/dL Final  . BUN 04/19/7251 10  6 - 23 mg/dL Final  . Creatinine, Ser 05/12/2012 0.71  0.50 - 1.10 mg/dL Final  . Calcium 66/44/0347 9.0  8.4 - 10.5 mg/dL  Final  . GFR calc non Af Amer 05/12/2012 >90  >90 mL/min Final  . GFR calc Af Amer 05/12/2012 >90  >90 mL/min Final   Comment:                                 The eGFR has been calculated                          using the CKD EPI equation.                          This calculation has not been                          validated in all clinical                          situations.                          eGFR's persistently                          <90 mL/min signify                          possible Chronic Kidney Disease.  Hospital Outpatient Visit on 05/02/2012  Component Date Value Range Status  . MRSA, PCR 05/02/2012 NEGATIVE  NEGATIVE Final  . Staphylococcus aureus 05/02/2012 POSITIVE* NEGATIVE Final   Comment:  The Xpert SA Assay (FDA                          approved for NASAL specimens                          in patients over 60 years of age),                          is one component of                          a comprehensive surveillance                          program.  Test performance has                          been validated by [employer] for patients greater                          than or equal to 57 year old.                          It is not intended                          to diagnose infection nor to                          guide or monitor treatment.  Marland Kitchen aPTT 05/02/2012 29  24 - 37 seconds Final  . WBC 05/02/2012 7.0  4.0 - 10.5 K/uL Final  . RBC 05/02/2012 4.98  3.87 - 5.11 MIL/uL Final  . Hemoglobin 05/02/2012 15.0  12.0 - 15.0 g/dL Final  . HCT 16/01/9603 45.0  36.0 - 46.0 % Final  . MCV 05/02/2012 90.4  78.0 - 100.0 fL Final  . MCH 05/02/2012 30.1  26.0 - 34.0 pg Final  . MCHC 05/02/2012 33.3  30.0 - 36.0 g/dL Final  . RDW 54/12/8117 12.4  11.5 - 15.5 % Final  . Platelets 05/02/2012 261  150 - 400 K/uL Final  . Sodium 05/02/2012 139  135 - 145 mEq/L Final  . Potassium 05/02/2012 3.6   3.5 - 5.1 mEq/L Final  . Chloride 05/02/2012 102  96 - 112 mEq/L Final  . CO2 05/02/2012 27  19 - 32 mEq/L Final  . Glucose, Bld 05/02/2012 85  70 - 99 mg/dL Final  . BUN 14/78/2956 15  6 - 23 mg/dL Final  . Creatinine, Ser 05/02/2012 0.66  0.50 - 1.10 mg/dL Final  . Calcium 21/30/8657 9.6  8.4 - 10.5 mg/dL Final  . Total Protein 05/02/2012 7.2  6.0 - 8.3 g/dL Final  . Albumin 84/69/6295 3.8  3.5 - 5.2 g/dL Final  . AST 28/41/3244 12  0 - 37 U/L Final  . ALT 05/02/2012 15  0 - 35 U/L Final  . Alkaline Phosphatase 05/02/2012 61  39 - 117 U/L Final  . Total Bilirubin 05/02/2012 0.3  0.3 - 1.2 mg/dL  Final  . GFR calc non Af Amer 05/02/2012 >90  >90 mL/min Final  . GFR calc Af Amer 05/02/2012 >90  >90 mL/min Final   Comment:                                 The eGFR has been calculated                          using the CKD EPI equation.                          This calculation has not been                          validated in all clinical                          situations.                          eGFR's persistently                          <90 mL/min signify                          possible Chronic Kidney Disease.  Marland Kitchen Prothrombin Time 05/02/2012 12.6  11.6 - 15.2 seconds Final  . INR 05/02/2012 0.95  0.00 - 1.49 Final  . Color, Urine 05/02/2012 YELLOW  YELLOW Final  . APPearance 05/02/2012 CLEAR  CLEAR Final  . Specific Gravity, Urine 05/02/2012 1.011  1.005 - 1.030 Final  . pH 05/02/2012 6.0  5.0 - 8.0 Final  . Glucose, UA 05/02/2012 NEGATIVE  NEGATIVE mg/dL Final  . Hgb urine dipstick 05/02/2012 NEGATIVE  NEGATIVE Final  . Bilirubin Urine 05/02/2012 NEGATIVE  NEGATIVE Final  . Ketones, ur 05/02/2012 NEGATIVE  NEGATIVE mg/dL Final  . Protein, ur 16/01/9603 NEGATIVE  NEGATIVE mg/dL Final  . Urobilinogen, UA 05/02/2012 0.2  0.0 - 1.0 mg/dL Final  . Nitrite 54/12/8117 NEGATIVE  NEGATIVE Final  . Leukocytes, UA 05/02/2012 NEGATIVE  NEGATIVE Final   MICROSCOPIC NOT DONE ON URINES  WITH NEGATIVE PROTEIN, BLOOD, LEUKOCYTES, NITRITE, OR GLUCOSE <1000 mg/dL.     X-Rays:Dg Chest 2 View  05/02/2012  *RADIOLOGY REPORT*  Clinical Data: Preop for left total knee replacement, history of asthma  CHEST - 2 VIEW  Comparison: Chest x-ray of 10/25/2010  Findings: No active infiltrate or effusion is seen.  Mild linear scarring remains at the left lung base.  Mediastinal contours are stable.  The heart is within upper limits normal.  There does appear to be a small hiatal hernia present.  No acute bony abnormality is seen.  IMPRESSION: Stable chest x-ray.  No active lung disease.  Probable small hiatal hernia.   Original Report Authenticated By: Dwyane Dee, M.D.     EKG: Orders placed during the hospital encounter of 05/10/12  . EKG     Hospital Course: Alyssa Phillips is a 57 y.o. who was admitted to [facility]. They were brought to the operating room on 05/10/2012 and underwent Procedure(s): TOTAL KNEE ARTHROPLASTY.  Patient tolerated the procedure well and was later transferred to the recovery room and then to  the orthopaedic floor for postoperative care.  They were given PO and IV analgesics for pain control following their surgery.  They were given 24 hours of postoperative antibiotics of  Anti-infectives     Start     Dose/Rate Route Frequency Ordered Stop   05/10/12 1900   ceFAZolin (ANCEF) IVPB 2 g/50 mL premix        2 g 100 mL/hr over 30 Minutes Intravenous Every 6 hours 05/10/12 1554 05/11/12 0054   05/10/12 1105   ceFAZolin (ANCEF) 3 g in dextrose 5 % 50 mL IVPB        3 g 160 mL/hr over 30 Minutes Intravenous 60 min pre-op 05/10/12 1105 05/10/12 1258         and started on DVT prophylaxis in the form of Xarelto.   PT and OT were ordered for total joint protocol.  Discharge planning consulted to help with postop disposition and equipment needs.  Patient had a rough night on the evening of surgery due to pain but started to get up OOB with therapy on day one and  walked 160 and then 200 feet. Hemovac drain was pulled without difficulty.  Continued to work with therapy into day two.  Dressing was changed on day two and the incision was healing well.  Patient was seen in rounds and was ready to go home later that same day.   Discharge Medications: Prior to Admission medications   Medication Sig Start Date End Date Taking? Authorizing Provider  Bepotastine Besilate (BEPREVE) 1.5 % SOLN Place 1 drop into both eyes daily as needed. For itchy eyes   Yes Historical Provider, MD  budesonide-formoterol (SYMBICORT) 160-4.5 MCG/ACT inhaler Inhale 2 puffs into the lungs 2 (two) times daily.   Yes Historical Provider, MD  hydrochlorothiazide (HYDRODIURIL) 25 MG tablet Take 25 mg by mouth daily.   Yes Historical Provider, MD  levocetirizine (XYZAL) 5 MG tablet Take 5 mg by mouth every evening.   Yes Historical Provider, MD  methocarbamol (ROBAXIN) 500 MG tablet Take 500 mg by mouth 3 (three) times daily.   Yes Historical Provider, MD  mometasone (NASONEX) 50 MCG/ACT nasal spray Place 2 sprays into the nose every morning.   Yes Historical Provider, MD  montelukast (SINGULAIR) 10 MG tablet Take 10 mg by mouth at bedtime.   Yes Historical Provider, MD  traMADol (ULTRAM) 50 MG tablet Take 50-100 mg by mouth every 6 (six) hours as needed. For pain   Yes Historical Provider, MD  oxyCODONE (OXY IR/ROXICODONE) 5 MG immediate release tablet Take 1-2 tablets (5-10 mg total) by mouth every 3 (three) hours as needed. 05/11/12   Loanne Drilling, MD  rivaroxaban (XARELTO) 10 MG TABS tablet Take 1 tablet (10 mg total) by mouth daily with breakfast. 05/11/12   Loanne Drilling, MD  traMADol (ULTRAM) 50 MG tablet Take 1-2 tablets (50-100 mg total) by mouth every 6 (six) hours as needed. 05/11/12   Loanne Drilling, MD  Vitamin D, Ergocalciferol, (DRISDOL) 50000 UNITS CAPS Take 50,000 Units by mouth every Saturday at 6 PM.    Historical Provider, MD    Diet: Cardiac  diet Activity:WBAT Follow-up:in 2 weeks Disposition - Home Discharged Condition: good      Medication List     As of 05/16/2012 10:01 AM    STOP taking these medications         acetaminophen 500 MG tablet   Commonly known as: TYLENOL      TAKE these medications  BEPREVE 1.5 % Soln   Generic drug: Bepotastine Besilate   Place 1 drop into both eyes daily as needed. For itchy eyes      budesonide-formoterol 160-4.5 MCG/ACT inhaler   Commonly known as: SYMBICORT   Inhale 2 puffs into the lungs 2 (two) times daily.      hydrochlorothiazide 25 MG tablet   Commonly known as: HYDRODIURIL   Take 25 mg by mouth daily.      levocetirizine 5 MG tablet   Commonly known as: XYZAL   Take 5 mg by mouth every evening.      methocarbamol 500 MG tablet   Commonly known as: ROBAXIN   Take 500 mg by mouth 3 (three) times daily.      mometasone 50 MCG/ACT nasal spray   Commonly known as: NASONEX   Place 2 sprays into the nose every morning.      montelukast 10 MG tablet   Commonly known as: SINGULAIR   Take 10 mg by mouth at bedtime.      oxyCODONE 5 MG immediate release tablet   Commonly known as: Oxy IR/ROXICODONE   Take 1-2 tablets (5-10 mg total) by mouth every 3 (three) hours as needed.      rivaroxaban 10 MG Tabs tablet   Commonly known as: XARELTO   Take 1 tablet (10 mg total) by mouth daily with breakfast.      traMADol 50 MG tablet   Commonly known as: ULTRAM   Take 1-2 tablets (50-100 mg total) by mouth every 6 (six) hours as needed.      traMADol 50 MG tablet   Commonly known as: ULTRAM   Take 50-100 mg by mouth every 6 (six) hours as needed. For pain      Vitamin D (Ergocalciferol) 50000 UNITS Caps   Commonly known as: DRISDOL   Take 50,000 Units by mouth every Saturday at 6 PM.           Follow-up Information    Follow up with Loanne Drilling, MD. Schedule an appointment as soon as possible for a visit on 05/23/2012. (Call [phone] [place] to make  the appointment)    Contact information:   [address] [place] [address] [phone]          Signed: Patrica Duel 05/16/2012, 10:01 AM

## 2012-11-15 ENCOUNTER — Other Ambulatory Visit: Payer: Self-pay | Admitting: Obstetrics and Gynecology

## 2012-11-15 ENCOUNTER — Other Ambulatory Visit (HOSPITAL_COMMUNITY)
Admission: RE | Admit: 2012-11-15 | Discharge: 2012-11-15 | Disposition: A | Discharge status: Home / Self Care | Payer: Managed Care, Other (non HMO) | Source: Ambulatory Visit | Attending: Obstetrics and Gynecology | Admitting: Obstetrics and Gynecology

## 2012-11-15 DIAGNOSIS — Z01419 Encounter for gynecological examination (general) (routine) without abnormal findings: Secondary | ICD-10-CM | POA: Insufficient documentation

## 2012-11-25 ENCOUNTER — Other Ambulatory Visit: Payer: Self-pay

## 2012-11-25 DIAGNOSIS — Z1231 Encounter for screening mammogram for malignant neoplasm of breast: Secondary | ICD-10-CM

## 2012-12-05 ENCOUNTER — Ambulatory Visit
Admission: RE | Admit: 2012-12-05 | Discharge: 2012-12-05 | Disposition: A | Discharge status: Home / Self Care | Payer: Managed Care, Other (non HMO) | Source: Ambulatory Visit

## 2012-12-05 DIAGNOSIS — Z1231 Encounter for screening mammogram for malignant neoplasm of breast: Secondary | ICD-10-CM

## 2013-10-19 ENCOUNTER — Encounter (HOSPITAL_BASED_OUTPATIENT_CLINIC_OR_DEPARTMENT_OTHER): Payer: Self-pay | Admitting: Emergency Medicine

## 2013-10-19 ENCOUNTER — Emergency Department (HOSPITAL_BASED_OUTPATIENT_CLINIC_OR_DEPARTMENT_OTHER)
Admission: EM | Admit: 2013-10-19 | Discharge: 2013-10-19 | Disposition: A | Discharge status: Home / Self Care | Payer: Managed Care, Other (non HMO) | Attending: Emergency Medicine | Admitting: Emergency Medicine

## 2013-10-19 ENCOUNTER — Emergency Department (HOSPITAL_BASED_OUTPATIENT_CLINIC_OR_DEPARTMENT_OTHER): Payer: Managed Care, Other (non HMO)

## 2013-10-19 DIAGNOSIS — I10 Essential (primary) hypertension: Secondary | ICD-10-CM | POA: Insufficient documentation

## 2013-10-19 DIAGNOSIS — Y9289 Other specified places as the place of occurrence of the external cause: Secondary | ICD-10-CM | POA: Insufficient documentation

## 2013-10-19 DIAGNOSIS — J45909 Unspecified asthma, uncomplicated: Secondary | ICD-10-CM | POA: Insufficient documentation

## 2013-10-19 DIAGNOSIS — M1710 Unilateral primary osteoarthritis, unspecified knee: Secondary | ICD-10-CM | POA: Insufficient documentation

## 2013-10-19 DIAGNOSIS — S82899A Other fracture of unspecified lower leg, initial encounter for closed fracture: Secondary | ICD-10-CM | POA: Insufficient documentation

## 2013-10-19 DIAGNOSIS — Y9389 Activity, other specified: Secondary | ICD-10-CM | POA: Insufficient documentation

## 2013-10-19 DIAGNOSIS — Z79899 Other long term (current) drug therapy: Secondary | ICD-10-CM | POA: Insufficient documentation

## 2013-10-19 DIAGNOSIS — R011 Cardiac murmur, unspecified: Secondary | ICD-10-CM | POA: Insufficient documentation

## 2013-10-19 DIAGNOSIS — W010XXA Fall on same level from slipping, tripping and stumbling without subsequent striking against object, initial encounter: Secondary | ICD-10-CM | POA: Insufficient documentation

## 2013-10-19 DIAGNOSIS — IMO0002 Reserved for concepts with insufficient information to code with codable children: Secondary | ICD-10-CM | POA: Insufficient documentation

## 2013-10-19 DIAGNOSIS — Z96659 Presence of unspecified artificial knee joint: Secondary | ICD-10-CM | POA: Insufficient documentation

## 2013-10-19 DIAGNOSIS — S82402A Unspecified fracture of shaft of left fibula, initial encounter for closed fracture: Secondary | ICD-10-CM

## 2013-10-19 MED ORDER — HYDROCODONE-ACETAMINOPHEN 5-325 MG PO TABS: 2.0000 | ORAL_TABLET | ORAL | Status: DC | PRN

## 2013-10-19 MED ORDER — TRAMADOL HCL 50 MG PO TABS: 50.0000 mg | ORAL_TABLET | Freq: Four times a day (QID) | ORAL | Status: DC | PRN

## 2013-10-19 NOTE — Discharge Instructions (Signed)
Fibular Fracture, Ankle, Adult, Undisplaced, Treated With Immobilization °A simple fracture of the bone below the knee on the outside of your leg (fibula) usually heals without problems. °CAUSES °Typically, a fibular fracture occurs as a result of trauma. A blow to the side of your leg or a powerful twisting movement can cause a fracture. Fibular fractures are often seen as a result of football, soccer, or skiing injuries. °SYMPTOMS °Symptoms of a fibular fracture can include: °· Pain. °· Shortening or abnormal alignment of your lower leg (angulation). °DIAGNOSIS °A health care provider will need to examine the leg. X-ray exams will be ordered for further to confirm the fracture and evaluate the extent and of the injury. °TREATMENT  °Typically, a cast or immobilizer is applied. Sometimes a splint is placed on these fractures if it is needed for comfort or if the bones are badly out of place. Crutches may be needed to help you get around.  °HOME CARE INSTRUCTIONS  °· Apply ice to the injured area: °¨ Put ice in a plastic bag. °¨ Place a towel between your skin and the bag. °¨ Leave the ice on for 20 minutes, 2-3 times a day. °· Use crutches as directed. Resume walking without crutches as directed by your health care provider or when comfortable doing so. °· Only take over-the-counter or prescription medicines for pain, discomfort, or fever as directed by your health care provider. °· Keeping your leg raised may lessen swelling. °· If you have a removable splint or boot, do not remove the boot unless directed by your health care provider. °· Do not not drive a car or operate a motor vehicle until your health care provider specifically tells you it is safe to do so. °SEEK IMMEDIATE MEDICAL CARE IF:  °· Your cast gets damaged or breaks. °· You have continued severe pain or more swelling than you did before the cast was put on, or the pain is not controlled with medications. °· Your skin or nails below the injury turn  blue or grey, or feel cold or numb. °· There is a bad smell or pus coming from under the cast. °· You develop severe pain in ankle or foot. °MAKE SURE YOU:  °· Understand these instructions. °· Will watch your condition. °· Will get help right away if you are not doing well or get worse. °Document Released: 12/24/2001 Document Revised: 01/22/2013 Document Reviewed: 11/13/2012 °[employer]® Patient Information ©2015 [employer]. This information is not intended to replace advice given to you by your health care provider. Make sure you discuss any questions you have with your health care provider. ° °

## 2013-10-19 NOTE — ED Provider Notes (Signed)
CSN: [id number]     Arrival date & time 10/19/13  [id number] History   First MD Initiated Contact with Patient 10/19/13 1032     Chief Complaint  Patient presents with  . Fall      HPI Patient slipped on the floor while getting out of the bathtub this morning.  His chief complaint pain and swelling to the left ankle and left lateral limits.  The patient in with complaints. Past Medical History  Diagnosis Date  . Hypertension   . Heart murmur     hx. mitral valve proplapse-mostly asymptomatic  . Asthma     environmental agents induced asthma  . Arthritis     Osteoarthrits-knees-hx. RTKA   Past Surgical History  Procedure Laterality Date  . Joint replacement  05-02-12    7'12 -s/p RTKA  . Cholecystectomy    . Tubal ligation    . Umbilical hernia repair    . Knee arthroscopy  05-02-12    x2 right/ x1 left  . Total knee arthroplasty  05/10/2012    Procedure: TOTAL KNEE ARTHROPLASTY;  Surgeon: Loanne DrillingFrank V Aluisio, MD;  Location: [facility];  Service: Orthopedics;  Laterality: Left;   No family history on file. History  Substance Use Topics  . Smoking status: Never Smoker   . Smokeless tobacco: Not on file  . Alcohol Use: Yes     Comment: occ. rare   OB History   Grav Para Term Preterm Abortions TAB SAB Ect Mult Living                 Review of Systems  All other systems reviewed and are negative  Allergies  Advil; Aspirin; and Compazine  Home Medications   Prior to Admission medications   Medication Sig Start Date End Date Taking? Authorizing Provider  Bepotastine Besilate (BEPREVE) 1.5 % SOLN Place 1 drop into both eyes daily as needed. For itchy eyes    Historical Provider, MD  budesonide-formoterol (SYMBICORT) 160-4.5 MCG/ACT inhaler Inhale 2 puffs into the lungs 2 (two) times daily.    Historical Provider, MD  hydrochlorothiazide (HYDRODIURIL) 25 MG tablet Take 25 mg by mouth daily.    Historical Provider, MD  HYDROcodone-acetaminophen (NORCO/VICODIN) 5-325 MG per tablet Take  2 tablets by mouth every 4 (four) hours as needed. 10/19/13   Nelia Shiobert L Jonah Gingras, MD  levocetirizine (XYZAL) 5 MG tablet Take 5 mg by mouth every evening.    Historical Provider, MD  methocarbamol (ROBAXIN) 500 MG tablet Take 500 mg by mouth 3 (three) times daily.    Historical Provider, MD  mometasone (NASONEX) 50 MCG/ACT nasal spray Place 2 sprays into the nose every morning.    Historical Provider, MD  montelukast (SINGULAIR) 10 MG tablet Take 10 mg by mouth at bedtime.    Historical Provider, MD  traMADol (ULTRAM) 50 MG tablet Take 50-100 mg by mouth every 6 (six) hours as needed. For pain    Historical Provider, MD  Vitamin D, Ergocalciferol, (DRISDOL) 50000 UNITS CAPS Take 50,000 Units by mouth every Saturday at 6 PM.    Historical Provider, MD   BP 133/112  Pulse 92  Temp(Src) 97.8 F (36.6 C) (Oral)  Resp 20  SpO2 94% Physical Exam  Nursing note and vitals reviewed. Constitutional: She is oriented to person, place, and time. She appears well-developed and well-nourished. No distress.  HENT:  Head: Normocephalic and atraumatic.  Eyes: Pupils are equal, round, and reactive to light.  Neck: Normal range of motion.  Cardiovascular: Normal  rate and intact distal pulses.   Pulmonary/Chest: No respiratory distress.  Abdominal: Normal appearance. She exhibits no distension.  Musculoskeletal:       Left ankle: She exhibits decreased range of motion and swelling. She exhibits no deformity. Tenderness. Lateral malleolus tenderness found. No medial malleolus tenderness found.  Neurological: She is alert and oriented to person, place, and time. No cranial nerve deficit.  Skin: Skin is warm and dry. No rash noted.  Psychiatric: She has a normal mood and affect. Her behavior is normal.    ED Course  Procedures (including critical care time) Plan is to place the patient in a Cam Walker boot she has crutches at home and has an orthopedic doctor for followup. Labs Review Labs Reviewed - No data  to display  Imaging Review Dg Ankle Complete Left  10/19/2013   CLINICAL DATA:  Status post fall now with ankle pain  EXAM: LEFT ANKLE COMPLETE - 3+ VIEW  COMPARISON:  None.  FINDINGS: There is a subtle minimally displaced spiral fracture of the distal left fibular meta diaphysis. No adjacent tibial fracture is demonstrated. The ankle joint mortise is preserved. The talar dome is intact. There is no acute malleolar fracture. Minimal degenerative spurring of the tips of the medial and lateral malleoli is demonstrated. There is no talar or calcaneal fracture. There is a plantar calcaneal spur. There is mild soft tissue swelling diffusely.  IMPRESSION: There is a minimally displaced spiral fracture of the distal left fibular metadiaphysis. There is no acute bony abnormality of the left ankle.   Electronically Signed   By: David  [place]   On: 10/19/2013 10:28   Dg Knee Complete 4 Views Left  10/19/2013   CLINICAL DATA:  Status post fall. Now with lateral knee pain ; history of left knee replacement 18 months ago  EXAM: LEFT KNEE - COMPLETE 4+ VIEW  COMPARISON:  None.  FINDINGS: The native bone is adequately mineralized for age. The prosthesis is in normal position. The interface with the native bone is normal. There is no joint effusion.  IMPRESSION: There is no acute bony abnormality nor abnormality of the prosthetic left knee joint.   Electronically Signed   By: David  [place]   On: 10/19/2013 10:26     EKG Interpretation None      MDM   Final diagnoses:  Closed fibular fracture, left, initial encounter        Nelia Shiobert L Detria Cummings, MD 10/19/13 1043

## 2013-10-19 NOTE — ED Notes (Signed)
Patient here with left ankle and knee pain after slipping and falling out of shower. No loc. Pain with ambulation, hx of knee replacement

## 2014-01-29 ENCOUNTER — Other Ambulatory Visit: Payer: Self-pay | Admitting: Obstetrics and Gynecology

## 2014-01-29 ENCOUNTER — Other Ambulatory Visit (HOSPITAL_COMMUNITY)
Admission: RE | Admit: 2014-01-29 | Discharge: 2014-01-29 | Disposition: A | Discharge status: Home / Self Care | Payer: Managed Care, Other (non HMO) | Source: Ambulatory Visit | Attending: Obstetrics and Gynecology | Admitting: Obstetrics and Gynecology

## 2014-01-29 DIAGNOSIS — Z01419 Encounter for gynecological examination (general) (routine) without abnormal findings: Secondary | ICD-10-CM | POA: Insufficient documentation

## 2014-02-03 LAB — CYTOLOGY - PAP

## 2015-03-15 ENCOUNTER — Other Ambulatory Visit (HOSPITAL_COMMUNITY)
Admission: RE | Admit: 2015-03-15 | Discharge: 2015-03-15 | Disposition: A | Discharge status: Home / Self Care | Payer: Managed Care, Other (non HMO) | Source: Ambulatory Visit | Attending: Obstetrics and Gynecology | Admitting: Obstetrics and Gynecology

## 2015-03-15 ENCOUNTER — Other Ambulatory Visit: Payer: Self-pay | Admitting: Obstetrics and Gynecology

## 2015-03-15 DIAGNOSIS — Z1151 Encounter for screening for human papillomavirus (HPV): Secondary | ICD-10-CM | POA: Insufficient documentation

## 2015-03-15 DIAGNOSIS — Z01419 Encounter for gynecological examination (general) (routine) without abnormal findings: Secondary | ICD-10-CM | POA: Diagnosis present

## 2015-03-16 LAB — CYTOLOGY - PAP

## 2015-03-17 LAB — HM PAP SMEAR: HM Pap smear: NORMAL

## 2015-09-15 ENCOUNTER — Ambulatory Visit
Admission: RE | Admit: 2015-09-15 | Discharge: 2015-09-15 | Disposition: A | Discharge status: Home / Self Care | Payer: BLUE CROSS/BLUE SHIELD | Source: Ambulatory Visit | Attending: Allergy | Admitting: Allergy

## 2015-09-15 ENCOUNTER — Other Ambulatory Visit: Payer: Self-pay | Admitting: Allergy

## 2015-09-15 DIAGNOSIS — J209 Acute bronchitis, unspecified: Secondary | ICD-10-CM

## 2015-11-29 ENCOUNTER — Other Ambulatory Visit: Payer: Self-pay | Admitting: Obstetrics and Gynecology

## 2015-11-29 DIAGNOSIS — Z1231 Encounter for screening mammogram for malignant neoplasm of breast: Secondary | ICD-10-CM

## 2015-12-13 ENCOUNTER — Ambulatory Visit: Payer: BLUE CROSS/BLUE SHIELD

## 2016-04-07 LAB — HEPATIC FUNCTION PANEL
ALT: 18 (ref 7–35)
AST: 15 (ref 13–35)
Alkaline Phosphatase: 56 (ref 25–125)
Bilirubin, Total: 0.5

## 2016-04-07 LAB — BASIC METABOLIC PANEL
BUN: 14 (ref 4–21)
CREATININE: 0.8 (ref 0.5–1.1)
Glucose: 99
POTASSIUM: 4 (ref 3.4–5.3)
SODIUM: 140 (ref 137–147)

## 2016-04-07 LAB — LIPID PANEL
CHOLESTEROL: 206 — AB (ref 0–200)
HDL: 44 (ref 35–70)
LDL Cholesterol: 139
TRIGLYCERIDES: 115 (ref 40–160)

## 2016-04-07 LAB — VITAMIN D 25 HYDROXY (VIT D DEFICIENCY, FRACTURES): Vit D, 25-Hydroxy: 21.1

## 2016-04-11 ENCOUNTER — Other Ambulatory Visit: Payer: Self-pay | Admitting: Physician Assistant

## 2016-04-11 DIAGNOSIS — Z1231 Encounter for screening mammogram for malignant neoplasm of breast: Secondary | ICD-10-CM

## 2016-05-02 ENCOUNTER — Ambulatory Visit
Admission: RE | Admit: 2016-05-02 | Discharge: 2016-05-02 | Disposition: A | Discharge status: Home / Self Care | Payer: Managed Care, Other (non HMO) | Source: Ambulatory Visit | Attending: Physician Assistant | Admitting: Physician Assistant

## 2016-05-02 DIAGNOSIS — Z1231 Encounter for screening mammogram for malignant neoplasm of breast: Secondary | ICD-10-CM

## 2016-05-02 LAB — HM MAMMOGRAPHY

## 2016-06-02 ENCOUNTER — Other Ambulatory Visit: Payer: Self-pay | Admitting: Gastroenterology

## 2016-06-07 ENCOUNTER — Encounter (HOSPITAL_COMMUNITY): Payer: Self-pay | Admitting: *Deleted

## 2016-06-07 NOTE — Anesthesia Preprocedure Evaluation (Addendum)
Anesthesia Evaluation  Patient identified by MRN, date of birth, ID band Patient awake    Reviewed: Allergy & Precautions, H&P , NPO status , Patient's Chart, lab work & pertinent test results  Airway Mallampati: III  TM Distance: >3 FB Neck ROM: Full    Dental no notable dental hx. (+) Teeth Intact, Dental Advisory Given   Pulmonary asthma ,    Pulmonary exam normal breath sounds clear to auscultation       Cardiovascular Exercise Tolerance: Good hypertension, Pt. on medications  Rhythm:Regular Rate:Normal     Neuro/Psych negative neurological ROS  negative psych ROS   GI/Hepatic Neg liver ROS, GERD  Medicated and Controlled,  Endo/Other  Morbid obesity  Renal/GU negative Renal ROS  negative genitourinary   Musculoskeletal  (+) Arthritis , Osteoarthritis,    Abdominal   Peds  Hematology negative hematology ROS (+)   Anesthesia Other Findings   Reproductive/Obstetrics negative OB ROS                           Anesthesia Physical Anesthesia Plan  ASA: III  Anesthesia Plan: MAC   Post-op Pain Management:    Induction: Intravenous  Airway Management Planned: Simple Face Mask  Additional Equipment:   Intra-op Plan:   Post-operative Plan:   Informed Consent: I have reviewed the patients History and Physical, chart, labs and discussed the procedure including the risks, benefits and alternatives for the proposed anesthesia with the patient or authorized representative who has indicated his/her understanding and acceptance.   Dental advisory given  Plan Discussed with: CRNA  Anesthesia Plan Comments:         Anesthesia Quick Evaluation

## 2016-06-07 NOTE — Progress Notes (Signed)
Spoke with pt for pre-op call. Pt has hx of Mitral valve prolapse, but states she's never had any problems with it. Denies chest pain. Hx of asthma, does have sob at times.   EKG - 05/02/2012 - in EPIC

## 2016-06-08 ENCOUNTER — Encounter (HOSPITAL_COMMUNITY)
Admission: RE | Disposition: A | Discharge status: Home / Self Care | Payer: Self-pay | Source: Ambulatory Visit | Attending: Gastroenterology

## 2016-06-08 ENCOUNTER — Ambulatory Visit (HOSPITAL_COMMUNITY): Payer: Managed Care, Other (non HMO) | Admitting: Anesthesiology

## 2016-06-08 ENCOUNTER — Encounter (HOSPITAL_COMMUNITY): Payer: Self-pay | Admitting: *Deleted

## 2016-06-08 ENCOUNTER — Ambulatory Visit (HOSPITAL_COMMUNITY)
Admission: RE | Admit: 2016-06-08 | Discharge: 2016-06-08 | Disposition: A | Discharge status: Home / Self Care | Payer: Managed Care, Other (non HMO) | Source: Ambulatory Visit | Attending: Gastroenterology | Admitting: Gastroenterology

## 2016-06-08 DIAGNOSIS — I10 Essential (primary) hypertension: Secondary | ICD-10-CM | POA: Diagnosis not present

## 2016-06-08 DIAGNOSIS — K219 Gastro-esophageal reflux disease without esophagitis: Secondary | ICD-10-CM | POA: Diagnosis not present

## 2016-06-08 DIAGNOSIS — J45909 Unspecified asthma, uncomplicated: Secondary | ICD-10-CM | POA: Insufficient documentation

## 2016-06-08 DIAGNOSIS — Z79899 Other long term (current) drug therapy: Secondary | ICD-10-CM | POA: Insufficient documentation

## 2016-06-08 DIAGNOSIS — E669 Obesity, unspecified: Secondary | ICD-10-CM | POA: Insufficient documentation

## 2016-06-08 DIAGNOSIS — M1990 Unspecified osteoarthritis, unspecified site: Secondary | ICD-10-CM | POA: Insufficient documentation

## 2016-06-08 DIAGNOSIS — Z1211 Encounter for screening for malignant neoplasm of colon: Secondary | ICD-10-CM | POA: Diagnosis present

## 2016-06-08 HISTORY — DX: Gastro-esophageal reflux disease without esophagitis: K21.9

## 2016-06-08 HISTORY — DX: Family history of other specified conditions: Z84.89

## 2016-06-08 HISTORY — PX: COLONOSCOPY WITH PROPOFOL: SHX5780

## 2016-06-08 HISTORY — DX: Vitamin D deficiency, unspecified: E55.9

## 2016-06-08 LAB — POCT I-STAT 4, (NA,K, GLUC, HGB,HCT)
GLUCOSE: 93 mg/dL (ref 65–99)
HEMATOCRIT: 44 % (ref 36.0–46.0)
HEMOGLOBIN: 15 g/dL (ref 12.0–15.0)
POTASSIUM: 3.8 mmol/L (ref 3.5–5.1)
Sodium: 142 mmol/L (ref 135–145)

## 2016-06-08 LAB — HM COLONOSCOPY

## 2016-06-08 SURGERY — COLONOSCOPY WITH PROPOFOL
Anesthesia: Monitor Anesthesia Care

## 2016-06-08 MED ORDER — PROPOFOL 500 MG/50ML IV EMUL
INTRAVENOUS | Status: DC | PRN
  Administered 2016-06-08: 100 ug/kg/min via INTRAVENOUS

## 2016-06-08 MED ORDER — SODIUM CHLORIDE 0.9 % IV SOLN: INTRAVENOUS | Status: DC

## 2016-06-08 MED ORDER — LACTATED RINGERS IV SOLN
INTRAVENOUS | Status: DC | PRN
  Administered 2016-06-08: 09:00:00 via INTRAVENOUS

## 2016-06-08 MED ORDER — LACTATED RINGERS IV SOLN
INTRAVENOUS | Status: DC
  Administered 2016-06-08: 09:00:00 via INTRAVENOUS

## 2016-06-08 MED ORDER — LIDOCAINE 2% (20 MG/ML) 5 ML SYRINGE
INTRAMUSCULAR | Status: DC | PRN
  Administered 2016-06-08: 30 mg via INTRAVENOUS

## 2016-06-08 NOTE — Anesthesia Procedure Notes (Signed)
Procedure Name: MAC Performed by: Eligha Bridegroom Pre-anesthesia Checklist: Patient identified Oxygen Delivery Method: Nasal cannula Preoxygenation: Pre-oxygenation with 100% oxygen

## 2016-06-08 NOTE — H&P (View-Only) (Signed)
Spoke with pt for pre-op call. Pt has hx of Mitral valve prolapse, but states she's never had any problems with it. Denies chest pain. Hx of asthma, does have sob at times.   EKG - 05/02/2012 - in EPIC 

## 2016-06-08 NOTE — Interval H&P Note (Signed)
History and Physical Interval Note:  06/08/2016 9:17 AM  Alyssa Phillips  has presented today for surgery, with the diagnosis of screening  The various methods of treatment have been discussed with the patient and family. After consideration of risks, benefits and other options for treatment, the patient has consented to  Procedure(s): COLONOSCOPY WITH PROPOFOL (N/A) as a surgical intervention .  The patient's history has been reviewed, patient examined, no change in status, stable for surgery.  I have reviewed the patient's chart and labs.  Questions were answered to the patient's satisfaction.     Alyssa Phillips

## 2016-06-08 NOTE — Transfer of Care (Signed)
Immediate Anesthesia Transfer of Care Note  Patient: Alyssa Phillips  Procedure(s) Performed: Procedure(s): COLONOSCOPY WITH PROPOFOL (N/A)  Patient Location: PACU  Anesthesia Type:MAC  Level of Consciousness: awake, alert  and oriented  Airway & Oxygen Therapy: Patient Spontanous Breathing  Post-op Assessment: Report given to RN and Post -op Vital signs reviewed and stable  Post vital signs: Reviewed and stable  Last Vitals:  Vitals:   06/08/16 0830 06/08/16 1009  BP: (!) 169/91 (!) 153/75  Pulse: 80 74  Resp: 15 12  Temp: 36.9 C 36.4 C    Last Pain:  Vitals:   06/08/16 1009  TempSrc: Oral         Complications: No apparent anesthesia complications

## 2016-06-08 NOTE — Anesthesia Postprocedure Evaluation (Signed)
Anesthesia Post Note  Patient: Alyssa Phillips  Procedure(s) Performed: Procedure(s) (LRB): COLONOSCOPY WITH PROPOFOL (N/A)  Patient location during evaluation: PACU Anesthesia Type: MAC Level of consciousness: awake and alert Pain management: pain level controlled Vital Signs Assessment: post-procedure vital signs reviewed and stable Respiratory status: spontaneous breathing, nonlabored ventilation and respiratory function stable Cardiovascular status: stable and blood pressure returned to baseline Anesthetic complications: no       Last Vitals:  Vitals:   06/08/16 1020 06/08/16 1030  BP: 136/78 (!) 119/95  Pulse: 68 72  Resp: 18 18  Temp:      Last Pain:  Vitals:   06/08/16 1009  TempSrc: Oral                 Carleigh Buccieri,W. EDMOND

## 2016-06-08 NOTE — Op Note (Signed)
[facility] Patient Name: Alyssa ChockMitzi Caudle Procedure Date : 06/08/2016 MRN: [id number] Attending MD: Barrie FolkJohn C Alonzo Loving , MD Date of Birth: [birth year] CSN: [id number] Age: [id number] Admit Type: Outpatient Procedure:                Colonoscopy Indications:              Screening for colorectal malignant neoplasm Providers:                Everardo AllJohn C. Madilyn FiremanHayes, MD, Dwain SarnaPatricia Ford, RN, Lorenda IshiharaSam Tetteh,                            Technician, Gwenyth Allegraichard Adami CRNA, CRNA Referring MD:              Medicines:                Propofol per Anesthesia Complications:            No immediate complications. Estimated Blood Loss:     Estimated blood loss: none. Procedure:                Pre-Anesthesia Assessment:                           - Prior to the procedure, a History and Physical                            was performed, and patient medications and                            allergies were reviewed. The patient's tolerance of                            previous anesthesia was also reviewed. The risks                            and benefits of the procedure and the sedation                            options and risks were discussed with the patient.                            All questions were answered, and informed consent                            was obtained. Prior Anticoagulants: The patient has                            taken no previous anticoagulant or antiplatelet                            agents. ASA Grade Assessment: III - A patient with                            severe systemic disease. After reviewing the risks  and benefits, the patient was deemed in                            satisfactory condition to undergo the procedure.                           After obtaining informed consent, the colonoscope                            was passed under direct vision. Throughout the                            procedure, the patient's blood pressure, pulse, and               oxygen saturations were monitored continuously. The                            EC-[id number] ([id number]) scope was introduced through                            the anus and advanced to the the cecum, identified                            by appendiceal orifice and ileocecal valve. The                            colonoscopy was performed without difficulty. The                            patient tolerated the procedure well. The quality                            of the bowel preparation was good. The ileocecal                            valve, appendiceal orifice, and rectum were                            photographed. Scope In: 9:35:42 AM Scope Out: 9:56:19 AM Scope Withdrawal Time: 0 hours 10 minutes 28 seconds  Total Procedure Duration: 0 hours 20 minutes 37 seconds  Findings:      The entire examined colon appeared normal. Impression:               - The entire examined colon is normal.                           - No specimens collected. Moderate Sedation:      no moderate sedation Recommendation:           - Patient has a contact number available for                            emergencies. The signs and symptoms of potential  delayed complications were discussed with the                            patient. Return to normal activities tomorrow.                            Written discharge instructions were provided to the                            patient.                           - Resume previous diet.                           - Continue present medications.                           - Repeat colonoscopy in 10 years for screening                            purposes. Procedure Code(s):        --- Professional ---                           [phone], Colonoscopy, flexible; diagnostic, including                            collection of specimen(s) by brushing or washing,                            when performed (separate procedure) Diagnosis  Code(s):        --- Professional ---                           Z12.11, Encounter for screening for malignant                            neoplasm of colon CPT copyright 2016 [employer]. All rights reserved. The codes documented in this report are preliminary and upon coder review may  be revised to meet current compliance requirements. Barrie Folk, MD 06/08/2016 10:03:18 AM This report has been signed electronically. Number of Addenda: 0

## 2016-06-08 NOTE — Discharge Instructions (Signed)

## 2016-06-12 ENCOUNTER — Encounter (HOSPITAL_COMMUNITY): Payer: Self-pay | Admitting: Gastroenterology

## 2016-10-31 LAB — BASIC METABOLIC PANEL
BUN: 15 (ref 4–21)
CREATININE: 0.8 (ref 0.5–1.1)
Glucose: 93
Potassium: 4.4 (ref 3.4–5.3)
Sodium: 140 (ref 137–147)

## 2016-10-31 LAB — LIPID PANEL
HDL: 47 (ref 35–70)
LDL Cholesterol: 155
TRIGLYCERIDES: 141 (ref 40–160)

## 2016-10-31 LAB — HEPATIC FUNCTION PANEL
ALT: 37 — AB (ref 7–35)
AST: 25 (ref 13–35)
Alkaline Phosphatase: 54 (ref 25–125)
Bilirubin, Total: 0.4

## 2016-10-31 LAB — CBC AND DIFFERENTIAL
HEMATOCRIT: 48 — AB (ref 36–46)
HEMOGLOBIN: 15.6 (ref 12.0–16.0)
Neutrophils Absolute: 4
PLATELETS: 236 (ref 150–399)
WBC: 7.5

## 2016-10-31 LAB — VITAMIN D 25 HYDROXY (VIT D DEFICIENCY, FRACTURES): Vit D, 25-Hydroxy: 37.5

## 2017-03-16 ENCOUNTER — Other Ambulatory Visit: Payer: Self-pay

## 2017-03-16 ENCOUNTER — Encounter (HOSPITAL_BASED_OUTPATIENT_CLINIC_OR_DEPARTMENT_OTHER): Payer: Self-pay | Admitting: Emergency Medicine

## 2017-03-16 ENCOUNTER — Emergency Department (HOSPITAL_BASED_OUTPATIENT_CLINIC_OR_DEPARTMENT_OTHER)
Admission: EM | Admit: 2017-03-16 | Discharge: 2017-03-16 | Disposition: A | Discharge status: Home / Self Care | Payer: 59 | Attending: Emergency Medicine | Admitting: Emergency Medicine

## 2017-03-16 DIAGNOSIS — I10 Essential (primary) hypertension: Secondary | ICD-10-CM | POA: Diagnosis not present

## 2017-03-16 DIAGNOSIS — I8002 Phlebitis and thrombophlebitis of superficial vessels of left lower extremity: Secondary | ICD-10-CM | POA: Diagnosis not present

## 2017-03-16 DIAGNOSIS — I8000 Phlebitis and thrombophlebitis of superficial vessels of unspecified lower extremity: Secondary | ICD-10-CM

## 2017-03-16 DIAGNOSIS — J45909 Unspecified asthma, uncomplicated: Secondary | ICD-10-CM | POA: Diagnosis not present

## 2017-03-16 DIAGNOSIS — Z79899 Other long term (current) drug therapy: Secondary | ICD-10-CM | POA: Insufficient documentation

## 2017-03-16 DIAGNOSIS — M79662 Pain in left lower leg: Secondary | ICD-10-CM | POA: Diagnosis present

## 2017-03-16 MED ORDER — CEPHALEXIN 500 MG PO CAPS: 500.0000 mg | ORAL_CAPSULE | Freq: Four times a day (QID) | ORAL | 0 refills | Status: DC

## 2017-03-16 MED ORDER — CEPHALEXIN 250 MG PO CAPS
500.0000 mg | ORAL_CAPSULE | Freq: Once | ORAL | Status: AC
  Administered 2017-03-16: 500 mg via ORAL
  Filled 2017-03-16: qty 2

## 2017-03-16 MED FILL — CEPHALEXIN 500 MG CAPSULE: 500 | 7 days supply | Qty: 28 | Fill #0

## 2017-03-16 NOTE — ED Triage Notes (Signed)
Left lower leg redness, swelling and tenderness.  [place]  No sob.

## 2017-03-16 NOTE — ED Notes (Signed)
Pt refused wheelchair. Ambulatory unassisted in NAD.

## 2017-03-16 NOTE — ED Provider Notes (Addendum)
[facility] EMERGENCY DEPARTMENT Provider Note   CSN: [id number] Arrival date & time: 03/16/17  [id number]     History   Chief Complaint Chief Complaint  Patient presents with  . Leg Pain    HPI Alyssa Phillips is a 61 y.o. female.  Patient c/o small area of redness and soreness to lateral aspect of left lower leg for past couple days. Symptoms mild, persistent, gradual onset, worse this AM.  Denies fever or chills. No hx same symptoms. Denies leg swelling. No chest pain or sob. No hx dvt or pe. Denies trauma/injury to area.    The history is provided by the patient.    Past Medical History:  Diagnosis Date  . Arthritis    Osteoarthrits-knees-hx. RTKA  . Asthma    environmental agents induced asthma  . Family history of adverse reaction to anesthesia    daughter very sensitive to anesthesia  . GERD (gastroesophageal reflux disease)   . Heart murmur    hx. mitral valve proplapse-mostly asymptomatic  . Hypertension   . Mitral valve prolapse   . Vitamin D deficiency     Patient Active Problem List   Diagnosis Date Noted  . OA (osteoarthritis) of knee 05/10/2012    Past Surgical History:  Procedure Laterality Date  . CHOLECYSTECTOMY    . COLONOSCOPY WITH PROPOFOL N/A 06/08/2016   Procedure: COLONOSCOPY WITH PROPOFOL;  Surgeon: Dorena CookeyJohn Hayes, MD;  [facility];  Service: Endoscopy;  Laterality: N/A;  . HERNIA REPAIR     umbilical  . JOINT REPLACEMENT  05-02-12   7'12 -s/p RTKA  . KNEE ARTHROSCOPY  05-02-12   x2 right/ x1 left  . TOTAL KNEE ARTHROPLASTY  05/10/2012   Procedure: TOTAL KNEE ARTHROPLASTY;  Surgeon: Loanne DrillingFrank V Aluisio, MD;  Location: [facility];  Service: Orthopedics;  Laterality: Left;  . TUBAL LIGATION    . UMBILICAL HERNIA REPAIR      OB History    No data available       Home Medications    Prior to Admission medications   Medication Sig Start Date End Date Taking? Authorizing Provider  acetaminophen (TYLENOL) 325 MG tablet Take  325 mg by mouth every 6 (six) hours as needed.    [provider]  albuterol (PROVENTIL HFA;VENTOLIN HFA) 108 (90 Base) MCG/ACT inhaler Inhale 2 puffs into the lungs every 6 (six) hours as needed for wheezing or shortness of breath.    [provider]  Bepotastine Besilate (BEPREVE) 1.5 % SOLN Place 1 drop into both eyes daily as needed. For itchy eyes    [provider]  budesonide-formoterol (SYMBICORT) 160-4.5 MCG/ACT inhaler Inhale 2 puffs into the lungs 2 (two) times daily.    [provider]  cephALEXin (KEFLEX) 500 MG capsule Take 1 capsule (500 mg total) by mouth 4 (four) times daily. 03/16/17   Cathren LaineSteinl, Kessie Croston, MD  cholecalciferol (VITAMIN D) 1000 units tablet Take 1,000 Units by mouth 2 (two) times daily.    [provider]  hydrochlorothiazide (HYDRODIURIL) 25 MG tablet Take 25 mg by mouth daily.    [provider]  levalbuterol Pauline Aus(XOPENEX HFA) 45 MCG/ACT inhaler Inhale 2 puffs into the lungs every 4 (four) hours as needed for wheezing.    [provider]  levalbuterol Pauline Aus(XOPENEX) 0.63 MG/3ML nebulizer solution Take 0.63 mg by nebulization every 4 (four) hours as needed for wheezing or shortness of breath.    [provider]  levocetirizine (XYZAL) 5 MG tablet Take 5 mg  by mouth every evening.    [provider]  methocarbamol (ROBAXIN) 500 MG tablet Take 500 mg by mouth 3 (three) times daily.    [provider]  mometasone (NASONEX) 50 MCG/ACT nasal spray Place 2 sprays into the nose every morning.    [provider]  mometasone-formoterol (DULERA) 100-5 MCG/ACT AERO Inhale 2 puffs into the lungs 2 (two) times daily.    [provider]  montelukast (SINGULAIR) 10 MG tablet Take 10 mg by mouth at bedtime.    [provider]  Multiple Vitamin (MULTIVITAMIN) tablet Take 1 tablet by mouth daily.    [provider]  naproxen sodium (ANAPROX) 220 MG tablet Take 220 mg by mouth  2 (two) times daily with a meal. Pt has been taking 1 a day for past week.    [provider]  traMADol (ULTRAM) 50 MG tablet Take 50-100 mg by mouth every 6 (six) hours as needed. For pain    [provider]  traMADol (ULTRAM) 50 MG tablet Take 1 tablet (50 mg total) by mouth every 6 (six) hours as needed. 10/19/13   Nelva NayBeaton, Robert, MD  Vitamin D, Ergocalciferol, (DRISDOL) 50000 UNITS CAPS Take 50,000 Units by mouth every Saturday at 6 PM.    [provider]    Family History Family History  Problem Relation Age of Onset  . Hypertension Mother   . Arthritis Mother   . Lymphoma Mother   . COPD Father     Social History Social History   Tobacco Use  . Smoking status: Never Smoker  . Smokeless tobacco: Never Used  Substance Use Topics  . Alcohol use: Yes    Comment: occ. rare  . Drug use: No     Allergies   Advil [ibuprofen]; Aspirin; and Compazine [prochlorperazine edisylate]   Review of Systems Review of Systems  Constitutional: Negative for chills and fever.  Respiratory: Negative for shortness of breath.   Cardiovascular: Negative for chest pain and leg swelling.  Skin: Negative for wound.     Physical Exam Updated Vital Signs BP (!) 170/118 (BP Location: Left Arm)   Pulse 78   Temp 98 F (36.7 C) (Oral)   Resp 20   Ht 1.753 m (5\' 9" )   Wt (!) 142.9 kg (315 lb)   SpO2 100%   BMI 46.52 kg/m   Physical Exam  Constitutional: She appears well-developed and well-nourished. No distress.  Eyes: Conjunctivae are normal. No scleral icterus.  Neck: Neck supple. No tracheal deviation present.  Cardiovascular: Normal rate and intact distal pulses.  Pulmonary/Chest: Effort normal. No respiratory distress.  Abdominal: Normal appearance.  Musculoskeletal: She exhibits no edema.  Patient with focal area redness, increased warmth, mild tenderness to lateral aspect of left lower leg, ?small cord - area appears c/w superficial thrombophlebitis  with possible early/mild cellulitis. No lower leg/leg edema. Distal pulses palp.   Neurological: She is alert.  Skin: Skin is warm and dry. No rash noted.  Psychiatric: She has a normal mood and affect.  Nursing note and vitals reviewed.    ED Treatments / Results  Labs (all labs ordered are listed, but only abnormal results are displayed) Labs Reviewed - No data to display  EKG  EKG Interpretation None       Radiology No results found.  Procedures Procedures (including critical care time)  Medications Ordered in ED Medications  cephALEXin (KEFLEX) capsule 500 mg (not administered)     Initial Impression / Assessment and Plan /  ED Course  I have reviewed the triage vital signs and the nursing notes.  Pertinent labs & imaging results that were available during my care of the patient were reviewed by me and considered in my medical decision making (see chart for details).  Area most c/w superficial thrombophlebitis w possible superimposed mild cellulitis.   bp is high today - will f/u pcp recheck.   Keflex po.   Asa. Heat/warm compresses.  Pt appears stable for d/c.     Final Clinical Impressions(s) / ED Diagnoses   Final diagnoses:  Superficial thrombophlebitis of lower extremity, unspecified laterality    ED Discharge Orders        Ordered    cephALEXin (KEFLEX) 500 MG capsule  4 times daily     03/16/17 0935           Cathren Laine, MD 03/16/17 [phone]

## 2017-03-16 NOTE — Discharge Instructions (Addendum)
It was our pleasure to provide your ER care today - we hope that you feel better.  Warm compresses/heat to sore area.   Take antibiotic as prescribed.  Take an enteric coated aspirin a day.    Your blood pressure is high today - take your blood pressure medication as prescribed, limit salt intake, and follow up with your doctor for recheck in the coming week.   Return to ER if worse, new symptoms, fevers, spreading redness, increased leg swelling, other concern.

## 2017-06-12 DIAGNOSIS — M25512 Pain in left shoulder: Secondary | ICD-10-CM

## 2017-06-12 HISTORY — DX: Pain in left shoulder: M25.512

## 2017-07-12 DIAGNOSIS — I10 Essential (primary) hypertension: Secondary | ICD-10-CM | POA: Diagnosis not present

## 2017-07-12 DIAGNOSIS — E559 Vitamin D deficiency, unspecified: Secondary | ICD-10-CM | POA: Diagnosis not present

## 2017-10-15 DIAGNOSIS — I213 ST elevation (STEMI) myocardial infarction of unspecified site: Secondary | ICD-10-CM

## 2017-10-15 HISTORY — DX: ST elevation (STEMI) myocardial infarction of unspecified site: I21.3

## 2017-10-27 DIAGNOSIS — J45909 Unspecified asthma, uncomplicated: Secondary | ICD-10-CM | POA: Diagnosis not present

## 2017-11-01 ENCOUNTER — Encounter (HOSPITAL_COMMUNITY): Payer: Self-pay

## 2017-11-01 ENCOUNTER — Ambulatory Visit (HOSPITAL_COMMUNITY): Payer: BLUE CROSS/BLUE SHIELD

## 2017-11-01 ENCOUNTER — Other Ambulatory Visit: Payer: Self-pay

## 2017-11-01 ENCOUNTER — Inpatient Hospital Stay (HOSPITAL_COMMUNITY)
Admission: EM | Admit: 2017-11-01 | Discharge: 2017-11-02 | DRG: 246 | Disposition: A | Discharge status: Home / Self Care | Payer: BLUE CROSS/BLUE SHIELD | Attending: Cardiology | Admitting: Cardiology

## 2017-11-01 ENCOUNTER — Inpatient Hospital Stay (HOSPITAL_COMMUNITY)
Admission: EM | Disposition: A | Discharge status: Home / Self Care | Payer: Self-pay | Source: Home / Self Care | Attending: Cardiology

## 2017-11-01 DIAGNOSIS — Z886 Allergy status to analgesic agent status: Secondary | ICD-10-CM

## 2017-11-01 DIAGNOSIS — I341 Nonrheumatic mitral (valve) prolapse: Secondary | ICD-10-CM | POA: Diagnosis present

## 2017-11-01 DIAGNOSIS — Z8249 Family history of ischemic heart disease and other diseases of the circulatory system: Secondary | ICD-10-CM | POA: Diagnosis not present

## 2017-11-01 DIAGNOSIS — I2510 Atherosclerotic heart disease of native coronary artery without angina pectoris: Secondary | ICD-10-CM

## 2017-11-01 DIAGNOSIS — I110 Hypertensive heart disease with heart failure: Secondary | ICD-10-CM | POA: Diagnosis present

## 2017-11-01 DIAGNOSIS — Z7951 Long term (current) use of inhaled steroids: Secondary | ICD-10-CM | POA: Diagnosis not present

## 2017-11-01 DIAGNOSIS — R0789 Other chest pain: Secondary | ICD-10-CM | POA: Diagnosis not present

## 2017-11-01 DIAGNOSIS — Z825 Family history of asthma and other chronic lower respiratory diseases: Secondary | ICD-10-CM

## 2017-11-01 DIAGNOSIS — I2102 ST elevation (STEMI) myocardial infarction involving left anterior descending coronary artery: Secondary | ICD-10-CM | POA: Diagnosis not present

## 2017-11-01 DIAGNOSIS — Z9049 Acquired absence of other specified parts of digestive tract: Secondary | ICD-10-CM

## 2017-11-01 DIAGNOSIS — R61 Generalized hyperhidrosis: Secondary | ICD-10-CM | POA: Diagnosis not present

## 2017-11-01 DIAGNOSIS — E785 Hyperlipidemia, unspecified: Secondary | ICD-10-CM | POA: Diagnosis not present

## 2017-11-01 DIAGNOSIS — Z9851 Tubal ligation status: Secondary | ICD-10-CM | POA: Diagnosis not present

## 2017-11-01 DIAGNOSIS — I213 ST elevation (STEMI) myocardial infarction of unspecified site: Secondary | ICD-10-CM | POA: Diagnosis not present

## 2017-11-01 DIAGNOSIS — Z96653 Presence of artificial knee joint, bilateral: Secondary | ICD-10-CM | POA: Diagnosis not present

## 2017-11-01 DIAGNOSIS — Z6841 Body Mass Index (BMI) 40.0 and over, adult: Secondary | ICD-10-CM

## 2017-11-01 DIAGNOSIS — Z791 Long term (current) use of non-steroidal anti-inflammatories (NSAID): Secondary | ICD-10-CM | POA: Diagnosis not present

## 2017-11-01 DIAGNOSIS — J45909 Unspecified asthma, uncomplicated: Secondary | ICD-10-CM | POA: Diagnosis not present

## 2017-11-01 DIAGNOSIS — I5021 Acute systolic (congestive) heart failure: Secondary | ICD-10-CM | POA: Diagnosis present

## 2017-11-01 DIAGNOSIS — Z807 Family history of other malignant neoplasms of lymphoid, hematopoietic and related tissues: Secondary | ICD-10-CM

## 2017-11-01 DIAGNOSIS — Z888 Allergy status to other drugs, medicaments and biological substances status: Secondary | ICD-10-CM

## 2017-11-01 DIAGNOSIS — E782 Mixed hyperlipidemia: Secondary | ICD-10-CM | POA: Diagnosis not present

## 2017-11-01 DIAGNOSIS — R079 Chest pain, unspecified: Secondary | ICD-10-CM | POA: Diagnosis not present

## 2017-11-01 DIAGNOSIS — Z955 Presence of coronary angioplasty implant and graft: Secondary | ICD-10-CM

## 2017-11-01 HISTORY — PX: LEFT HEART CATH AND CORONARY ANGIOGRAPHY: CATH118249

## 2017-11-01 HISTORY — PX: CORONARY STENT INTERVENTION: CATH118234

## 2017-11-01 HISTORY — PX: CORONARY/GRAFT ACUTE MI REVASCULARIZATION: CATH118305

## 2017-11-01 LAB — TROPONIN I: Troponin I: 0.1 ng/mL (ref <0.03)

## 2017-11-01 LAB — COMPREHENSIVE METABOLIC PANEL
ALBUMIN: 3.8 g/dL (ref 3.5–5.0)
ALK PHOS: 61 U/L (ref 38–126)
ALT: 30 U/L (ref 0–44)
AST: 20 U/L (ref 15–41)
Anion gap: 12 (ref 5–15)
BUN: 22 mg/dL (ref 8–23)
CALCIUM: 10.3 mg/dL (ref 8.9–10.3)
CHLORIDE: 109 mmol/L (ref 98–111)
CO2: 21 mmol/L — AB (ref 22–32)
CREATININE: 0.79 mg/dL (ref 0.44–1.00)
GFR calc Af Amer: >60 mL/min (ref >60)
GFR calc non Af Amer: >60 mL/min (ref >60)
GLUCOSE: 136 mg/dL — AB (ref 70–99)
Potassium: 4 mmol/L (ref 3.5–5.1)
SODIUM: 142 mmol/L (ref 135–145)
Total Bilirubin: 0.5 mg/dL (ref 0.3–1.2)
Total Protein: 6.7 g/dL (ref 6.5–8.1)

## 2017-11-01 LAB — CBC WITH DIFFERENTIAL/PLATELET
ABS IMMATURE GRANULOCYTES: 0.1 10*3/uL (ref 0.0–0.1)
BASOS ABS: 0.1 10*3/uL (ref 0.0–0.1)
BASOS PCT: 1 %
EOS PCT: 0 %
Eosinophils Absolute: 0 10*3/uL (ref 0.0–0.7)
HCT: 50.3 % — ABNORMAL HIGH (ref 36.0–46.0)
Hemoglobin: 15.5 g/dL — ABNORMAL HIGH (ref 12.0–15.0)
Immature Granulocytes: 1 %
LYMPHS PCT: 20 %
Lymphs Abs: 2 10*3/uL (ref 0.7–4.0)
MCH: 29.4 pg (ref 26.0–34.0)
MCHC: 30.8 g/dL (ref 30.0–36.0)
MCV: 95.3 fL (ref 78.0–100.0)
Monocytes Absolute: 0.5 10*3/uL (ref 0.1–1.0)
Monocytes Relative: 5 %
NEUTROS ABS: 7.6 10*3/uL (ref 1.7–7.7)
Neutrophils Relative %: 73 %
PLATELETS: 262 10*3/uL (ref 150–400)
RBC: 5.28 MIL/uL — AB (ref 3.87–5.11)
RDW: 13.2 % (ref 11.5–15.5)
WBC: 10.3 10*3/uL (ref 4.0–10.5)

## 2017-11-01 LAB — BASIC METABOLIC PANEL
ANION GAP: 8 (ref 5–15)
BUN: 16 mg/dL (ref 8–23)
CALCIUM: 9.9 mg/dL (ref 8.9–10.3)
CO2: 23 mmol/L (ref 22–32)
CREATININE: 0.69 mg/dL (ref 0.44–1.00)
Chloride: 112 mmol/L — ABNORMAL HIGH (ref 98–111)
Glucose, Bld: 96 mg/dL (ref 70–99)
Potassium: 3.8 mmol/L (ref 3.5–5.1)
SODIUM: 143 mmol/L (ref 135–145)

## 2017-11-01 LAB — CBC
HCT: 44.6 % (ref 36.0–46.0)
HEMOGLOBIN: 14.3 g/dL (ref 12.0–15.0)
MCH: 29.9 pg (ref 26.0–34.0)
MCHC: 32.1 g/dL (ref 30.0–36.0)
MCV: 93.1 fL (ref 78.0–100.0)
PLATELETS: 261 10*3/uL (ref 150–400)
RBC: 4.79 MIL/uL (ref 3.87–5.11)
RDW: 13.1 % (ref 11.5–15.5)
WBC: 11.2 10*3/uL — AB (ref 4.0–10.5)

## 2017-11-01 LAB — LIPID PANEL
CHOL/HDL RATIO: 4.9 ratio
Cholesterol: 240 mg/dL — ABNORMAL HIGH (ref 0–200)
HDL: 49 mg/dL (ref >40)
LDL CALC: 166 mg/dL — AB (ref 0–99)
Triglycerides: 126 mg/dL (ref <150)
VLDL: 25 mg/dL (ref 0–40)

## 2017-11-01 LAB — ECHOCARDIOGRAM COMPLETE
Height: 70 in
WEIGHTICAEL: 5440 [oz_av]

## 2017-11-01 LAB — PROTIME-INR
INR: 1.04
Prothrombin Time: 13.5 seconds (ref 11.4–15.2)

## 2017-11-01 LAB — MRSA PCR SCREENING: MRSA BY PCR: NEGATIVE

## 2017-11-01 LAB — APTT: aPTT: 108 seconds — ABNORMAL HIGH (ref 24–36)

## 2017-11-01 SURGERY — CORONARY/GRAFT ACUTE MI REVASCULARIZATION
Anesthesia: LOCAL

## 2017-11-01 MED ORDER — HEPARIN SODIUM (PORCINE) 5000 UNIT/ML IJ SOLN: 60.0000 [IU]/kg | Freq: Once | INTRAMUSCULAR | Status: DC

## 2017-11-01 MED ORDER — TICAGRELOR 90 MG PO TABS
ORAL_TABLET | ORAL | Status: DC | PRN
  Administered 2017-11-01: 180 mg via ORAL

## 2017-11-01 MED ORDER — SODIUM CHLORIDE 0.9% FLUSH
3.0000 mL | Freq: Two times a day (BID) | INTRAVENOUS | Status: DC
  Administered 2017-11-01 – 2017-11-02 (×3): 3 mL via INTRAVENOUS

## 2017-11-01 MED ORDER — HEPARIN SODIUM (PORCINE) 5000 UNIT/ML IJ SOLN
4000.0000 [IU] | Freq: Once | INTRAMUSCULAR | Status: AC
  Administered 2017-11-01: 4000 [IU] via INTRAVENOUS

## 2017-11-01 MED ORDER — LIDOCAINE HCL (PF) 1 % IJ SOLN
INTRAMUSCULAR | Status: DC | PRN
  Administered 2017-11-01: 2 mL

## 2017-11-01 MED ORDER — VITAMIN D 1000 UNITS PO TABS
1000.0000 [IU] | ORAL_TABLET | Freq: Two times a day (BID) | ORAL | Status: DC
  Administered 2017-11-01 – 2017-11-02 (×3): 1000 [IU] via ORAL
  Filled 2017-11-01 (×3): qty 1

## 2017-11-01 MED ORDER — ATORVASTATIN CALCIUM 80 MG PO TABS
80.0000 mg | ORAL_TABLET | Freq: Every day | ORAL | Status: DC
  Administered 2017-11-01: 80 mg via ORAL
  Filled 2017-11-01: qty 1

## 2017-11-01 MED ORDER — PERFLUTREN LIPID MICROSPHERE
INTRAVENOUS | Status: AC
  Administered 2017-11-01: 3 mL via INTRAVENOUS
  Filled 2017-11-01: qty 10

## 2017-11-01 MED ORDER — VERAPAMIL HCL 2.5 MG/ML IV SOLN
INTRAVENOUS | Status: AC
  Filled 2017-11-01: qty 2

## 2017-11-01 MED ORDER — SODIUM CHLORIDE 0.9% FLUSH: 3.0000 mL | INTRAVENOUS | Status: DC | PRN

## 2017-11-01 MED ORDER — METOPROLOL TARTRATE 25 MG PO TABS
25.0000 mg | ORAL_TABLET | Freq: Two times a day (BID) | ORAL | Status: DC
  Administered 2017-11-01 (×3): 25 mg via ORAL
  Filled 2017-11-01 (×4): qty 1

## 2017-11-01 MED ORDER — MONTELUKAST SODIUM 10 MG PO TABS
10.0000 mg | ORAL_TABLET | Freq: Every day | ORAL | Status: DC
  Administered 2017-11-01: 10 mg via ORAL
  Filled 2017-11-01: qty 1

## 2017-11-01 MED ORDER — SODIUM CHLORIDE 0.9 % IV SOLN: 250.0000 mL | INTRAVENOUS | Status: DC | PRN

## 2017-11-01 MED ORDER — HYDRALAZINE HCL 20 MG/ML IJ SOLN: 5.0000 mg | INTRAMUSCULAR | Status: AC | PRN

## 2017-11-01 MED ORDER — CETIRIZINE HCL 10 MG PO TABS
10.0000 mg | ORAL_TABLET | Freq: Every evening | ORAL | Status: DC
  Administered 2017-11-01: 10 mg via ORAL
  Filled 2017-11-01 (×2): qty 1

## 2017-11-01 MED ORDER — LIDOCAINE HCL (PF) 1 % IJ SOLN
INTRAMUSCULAR | Status: AC
  Filled 2017-11-01: qty 30

## 2017-11-01 MED ORDER — NITROGLYCERIN 1 MG/10 ML FOR IR/CATH LAB
INTRA_ARTERIAL | Status: AC
  Filled 2017-11-01: qty 10

## 2017-11-01 MED ORDER — ADULT MULTIVITAMIN W/MINERALS CH
1.0000 | ORAL_TABLET | Freq: Every day | ORAL | Status: DC
Start: 2017-11-01 — End: 2017-11-02
  Administered 2017-11-01 – 2017-11-02 (×2): 1 via ORAL
  Filled 2017-11-01 (×2): qty 1

## 2017-11-01 MED ORDER — LABETALOL HCL 5 MG/ML IV SOLN: 10.0000 mg | INTRAVENOUS | Status: AC | PRN

## 2017-11-01 MED ORDER — ASPIRIN EC 81 MG PO TBEC
81.0000 mg | DELAYED_RELEASE_TABLET | Freq: Every day | ORAL | Status: DC
  Administered 2017-11-01 – 2017-11-02 (×2): 81 mg via ORAL
  Filled 2017-11-01 (×2): qty 1

## 2017-11-01 MED ORDER — MOMETASONE FURO-FORMOTEROL FUM 200-5 MCG/ACT IN AERO
2.0000 | INHALATION_SPRAY | Freq: Two times a day (BID) | RESPIRATORY_TRACT | Status: DC
  Administered 2017-11-01 – 2017-11-02 (×3): 2 via RESPIRATORY_TRACT
  Filled 2017-11-01: qty 8.8

## 2017-11-01 MED ORDER — IOPAMIDOL (ISOVUE-370) INJECTION 76%
INTRAVENOUS | Status: DC | PRN
  Administered 2017-11-01: 125 mL via INTRA_ARTERIAL

## 2017-11-01 MED ORDER — HEPARIN (PORCINE) IN NACL 1000-0.9 UT/500ML-% IV SOLN
INTRAVENOUS | Status: DC | PRN
  Administered 2017-11-01 (×2): 500 mL

## 2017-11-01 MED ORDER — SODIUM CHLORIDE 0.9 % WEIGHT BASED INFUSION
1.0000 mL/kg/h | INTRAVENOUS | Status: AC
  Administered 2017-11-01: 1 mL/kg/h via INTRAVENOUS

## 2017-11-01 MED ORDER — HEPARIN (PORCINE) IN NACL 1000-0.9 UT/500ML-% IV SOLN
INTRAVENOUS | Status: AC
  Filled 2017-11-01: qty 1500

## 2017-11-01 MED ORDER — HEPARIN SODIUM (PORCINE) 1000 UNIT/ML IJ SOLN
INTRAMUSCULAR | Status: AC
  Filled 2017-11-01: qty 2

## 2017-11-01 MED ORDER — SODIUM CHLORIDE 0.9 % IV SOLN: INTRAVENOUS | Status: DC

## 2017-11-01 MED ORDER — TICAGRELOR 90 MG PO TABS
90.0000 mg | ORAL_TABLET | Freq: Two times a day (BID) | ORAL | Status: DC
  Administered 2017-11-01 – 2017-11-02 (×3): 90 mg via ORAL
  Filled 2017-11-01 (×3): qty 1

## 2017-11-01 MED ORDER — HEPARIN SODIUM (PORCINE) 5000 UNIT/ML IJ SOLN
INTRAMUSCULAR | Status: AC
  Filled 2017-11-01: qty 1

## 2017-11-01 MED ORDER — HEPARIN (PORCINE) IN NACL 2000-0.9 UNIT/L-% IV SOLN: INTRAVENOUS | Status: DC | PRN

## 2017-11-01 MED ORDER — VERAPAMIL HCL 2.5 MG/ML IV SOLN
INTRAVENOUS | Status: DC | PRN
  Administered 2017-11-01: 10 mL via INTRA_ARTERIAL

## 2017-11-01 MED ORDER — LISINOPRIL 5 MG PO TABS
5.0000 mg | ORAL_TABLET | Freq: Every day | ORAL | Status: DC
  Administered 2017-11-01 – 2017-11-02 (×2): 5 mg via ORAL
  Filled 2017-11-01 (×2): qty 1

## 2017-11-01 MED ORDER — IOHEXOL 350 MG/ML SOLN
INTRAVENOUS | Status: DC | PRN
  Administered 2017-11-01: 30 mL via INTRA_ARTERIAL

## 2017-11-01 MED ORDER — ENOXAPARIN SODIUM 40 MG/0.4ML ~~LOC~~ SOLN
40.0000 mg | SUBCUTANEOUS | Status: DC
  Administered 2017-11-02: 40 mg via SUBCUTANEOUS
  Filled 2017-11-01: qty 0.4

## 2017-11-01 MED ORDER — IOPAMIDOL (ISOVUE-370) INJECTION 76%
INTRAVENOUS | Status: AC
  Filled 2017-11-01: qty 125

## 2017-11-01 MED ORDER — PERFLUTREN LIPID MICROSPHERE
1.0000 mL | INTRAVENOUS | Status: AC | PRN
  Administered 2017-11-01: 3 mL via INTRAVENOUS
  Filled 2017-11-01: qty 10

## 2017-11-01 MED ORDER — ONDANSETRON HCL 4 MG/2ML IJ SOLN: 4.0000 mg | Freq: Four times a day (QID) | INTRAMUSCULAR | Status: DC | PRN

## 2017-11-01 MED ORDER — NITROGLYCERIN 1 MG/10 ML FOR IR/CATH LAB
INTRA_ARTERIAL | Status: DC | PRN
  Administered 2017-11-01: 200 ug via INTRACORONARY

## 2017-11-01 MED ORDER — ACETAMINOPHEN 325 MG PO TABS
650.0000 mg | ORAL_TABLET | ORAL | Status: DC | PRN
  Administered 2017-11-01 (×2): 650 mg via ORAL
  Filled 2017-11-01 (×2): qty 2

## 2017-11-01 MED ORDER — HEPARIN SODIUM (PORCINE) 1000 UNIT/ML IJ SOLN
INTRAMUSCULAR | Status: DC | PRN
  Administered 2017-11-01: 12000 [IU] via INTRAVENOUS

## 2017-11-01 SURGICAL SUPPLY — 24 items
BALLN SAPPHIRE 2.5X12 (BALLOONS) ×2
BALLN ~~LOC~~ EUPHORA RX 4.5X8 (BALLOONS) ×2
BALLOON SAPPHIRE 2.5X12 (BALLOONS) IMPLANT
BALLOON ~~LOC~~ EUPHORA RX 4.5X8 (BALLOONS) IMPLANT
CATH 5FR JL3.5 JR4 ANG PIG MP (CATHETERS) ×1 IMPLANT
CATH EXTRAC PRONTO 5.5F 138CM (CATHETERS) IMPLANT
CATH VISTA GUIDE 6FR XBLAD3.5 (CATHETERS) ×1 IMPLANT
COVER PRB 48X5XTLSCP FOLD TPE (BAG) IMPLANT
COVER PROBE 5X48 (BAG) ×2
DEVICE RAD COMP TR BAND LRG (VASCULAR PRODUCTS) ×1 IMPLANT
GLIDESHEATH SLEND SS 6F .021 (SHEATH) ×1 IMPLANT
GUIDEWIRE INQWIRE 1.5J.035X260 (WIRE) IMPLANT
HOVERMATT SINGLE USE (MISCELLANEOUS) ×1 IMPLANT
INQWIRE 1.5J .035X260CM (WIRE) ×2
KIT ENCORE 26 ADVANTAGE (KITS) ×2 IMPLANT
KIT HEART LEFT (KITS) ×2 IMPLANT
KIT HEMO VALVE WATCHDOG (MISCELLANEOUS) ×1 IMPLANT
PACK CARDIAC CATHETERIZATION (CUSTOM PROCEDURE TRAY) ×2 IMPLANT
STENT RESOLUTE ONYX 4.5X12 (Permanent Stent) ×1 IMPLANT
SYR MEDRAD MARK V 150ML (SYRINGE) ×2 IMPLANT
TRANSDUCER W/STOPCOCK (MISCELLANEOUS) ×2 IMPLANT
TUBING CIL FLEX 10 FLL-RA (TUBING) ×2 IMPLANT
WIRE ASAHI PROWATER 180CM (WIRE) ×1 IMPLANT
WIRE HI TORQ VERSACORE-J 145CM (WIRE) ×1 IMPLANT

## 2017-11-01 NOTE — H&P (Addendum)
Cardiology History & Physical    Patient ID: Alyssa Phillips MRN: [id number], DOB: [birth year] Date of Encounter: 11/01/2017, 2:03 AM Primary Physician: Marinda Elk, MD  Chief Complaint: Chest pain   HPI: Alyssa Phillips is a 62 y.o. female with history of HTN, asthma, mitral valve prolapse (no TTE reports available) who presents with chest pain.  The pt was resting on her sofa, when she felt the relatively acute onset of substernal chest pain with mild associated shortness of breath.  She called EMS quite promptly after the onset of her symptoms.  Upon EMS arrival, she had profound STE in the anterior and lateral leads with reciprocal depression in the inferior leads.  She received SL nitroglycerin with near complete resolution of her CP and ECG changes.  She was transferred to [facility] for further management.  In the ED, she was normotensive and chest pain free.  She received 4000 units of IV heparin.  A code STEMI was activated.  Past Medical History:  Diagnosis Date  . Arthritis    Osteoarthrits-knees-hx. RTKA  . Asthma    environmental agents induced asthma  . Family history of adverse reaction to anesthesia    daughter very sensitive to anesthesia  . GERD (gastroesophageal reflux disease)   . Heart murmur    hx. mitral valve proplapse-mostly asymptomatic  . Hypertension   . Mitral valve prolapse   . Vitamin D deficiency      Surgical History:  Past Surgical History:  Procedure Laterality Date  . CHOLECYSTECTOMY    . COLONOSCOPY WITH PROPOFOL N/A 06/08/2016   Procedure: COLONOSCOPY WITH PROPOFOL;  Surgeon: Dorena Cookey, MD;  Location: [facility] ENDOSCOPY;  Service: Endoscopy;  Laterality: N/A;  . HERNIA REPAIR     umbilical  . JOINT REPLACEMENT  05-02-12   7'12 -s/p RTKA  . KNEE ARTHROSCOPY  05-02-12   x2 right/ x1 left  . TOTAL KNEE ARTHROPLASTY  05/10/2012   Procedure: TOTAL KNEE ARTHROPLASTY;  Surgeon: Loanne Drilling, MD;  Location: [facility] ORS;  Service: Orthopedics;   Laterality: Left;  . TUBAL LIGATION    . UMBILICAL HERNIA REPAIR       Home Meds: Prior to Admission medications   Medication Sig Start Date End Date Taking? Authorizing Provider  acetaminophen (TYLENOL) 325 MG tablet Take 325 mg by mouth every 6 (six) hours as needed.    [provider]  albuterol (PROVENTIL HFA;VENTOLIN HFA) 108 (90 Base) MCG/ACT inhaler Inhale 2 puffs into the lungs every 6 (six) hours as needed for wheezing or shortness of breath.    [provider]  Bepotastine Besilate (BEPREVE) 1.5 % SOLN Place 1 drop into both eyes daily as needed. For itchy eyes    [provider]  budesonide-formoterol (SYMBICORT) 160-4.5 MCG/ACT inhaler Inhale 2 puffs into the lungs 2 (two) times daily.    [provider]  cephALEXin (KEFLEX) 500 MG capsule Take 1 capsule (500 mg total) by mouth 4 (four) times daily. 03/16/17   Cathren Laine, MD  cholecalciferol (VITAMIN D) 1000 units tablet Take 1,000 Units by mouth 2 (two) times daily.    [provider]  hydrochlorothiazide (HYDRODIURIL) 25 MG tablet Take 25 mg by mouth daily.    [provider]  levalbuterol Pauline Aus HFA) 45 MCG/ACT inhaler Inhale 2 puffs into the lungs every 4 (four) hours as needed for wheezing.    [provider]  levalbuterol Pauline Aus) 0.63 MG/3ML nebulizer solution Take 0.63 mg by nebulization every 4 (four)  hours as needed for wheezing or shortness of breath.    [provider]  levocetirizine (XYZAL) 5 MG tablet Take 5 mg by mouth every evening.    [provider]  methocarbamol (ROBAXIN) 500 MG tablet Take 500 mg by mouth 3 (three) times daily.    [provider]  mometasone (NASONEX) 50 MCG/ACT nasal spray Place 2 sprays into the nose every morning.    [provider]  mometasone-formoterol (DULERA) 100-5 MCG/ACT AERO Inhale 2 puffs into the lungs 2 (two) times daily.    [provider]  montelukast (SINGULAIR) 10  MG tablet Take 10 mg by mouth at bedtime.    [provider]  Multiple Vitamin (MULTIVITAMIN) tablet Take 1 tablet by mouth daily.    [provider]  naproxen sodium (ANAPROX) 220 MG tablet Take 220 mg by mouth 2 (two) times daily with a meal. Pt has been taking 1 a day for past week.    [provider]  traMADol (ULTRAM) 50 MG tablet Take 50-100 mg by mouth every 6 (six) hours as needed. For pain    [provider]  traMADol (ULTRAM) 50 MG tablet Take 1 tablet (50 mg total) by mouth every 6 (six) hours as needed. 10/19/13   Nelva Nay, MD  Vitamin D, Ergocalciferol, (DRISDOL) 50000 UNITS CAPS Take 50,000 Units by mouth every Saturday at 6 PM.    [provider]    Allergies:  Allergies  Allergen Reactions  . Advil [Ibuprofen] Hives  . Aspirin     NEEDS TO EC ASPIRIN  . Compazine [Prochlorperazine Edisylate] Other (See Comments)    Eyes rolled back, tongue curled up    Social History   Socioeconomic History  . Marital status: Married    Spouse name: Not on file  . Number of children: Not on file  . Years of education: Not on file  . Highest education level: Not on file  Occupational History  . Not on file  Social Needs  . Financial resource strain: Not on file  . Food insecurity:    Worry: Not on file    Inability: Not on file  . Transportation needs:    Medical: Not on file    Non-medical: Not on file  Tobacco Use  . Smoking status: Never Smoker  . Smokeless tobacco: Never Used  Substance and Sexual Activity  . Alcohol use: Yes    Comment: occ. rare  . Drug use: No  . Sexual activity: Yes  Lifestyle  . Physical activity:    Days per week: Not on file    Minutes per session: Not on file  . Stress: Not on file  Relationships  . Social connections:    Talks on phone: Not on file    Gets together: Not on file    Attends religious service: Not on file    Active member of club or organization: Not on file    Attends  meetings of clubs or organizations: Not on file    Relationship status: Not on file  . Intimate partner violence:    Fear of current or ex partner: Not on file    Emotionally abused: Not on file    Physically abused: Not on file    Forced sexual activity: Not on file  Other Topics Concern  . Not on file  Social History Narrative  . Not on file     Family History  Problem Relation Age of Onset  . Hypertension Mother   .  Arthritis Mother   . Lymphoma Mother   . COPD Father     Review of Systems: All other systems reviewed and are otherwise negative except as noted above.  Labs:   Lab Results  Component Value Date   WBC 11.0 (H) 05/12/2012   HGB 15.0 06/08/2016   HCT 44.0 06/08/2016   MCV 90.9 05/12/2012   PLT 230 05/12/2012   No results for input(s): NA, K, CL, CO2, BUN, CREATININE, CALCIUM, PROT, BILITOT, ALKPHOS, ALT, AST, GLUCOSE in the last 168 hours.  Invalid input(s): LABALBU No results for input(s): CKTOTAL, CKMB, TROPONINI in the last 72 hours. No results found for: CHOL, HDL, LDLCALC, TRIG No results found for: DDIMER  Radiology/Studies:  No results found. Wt Readings from Last 3 Encounters:  11/01/17 (!) 154.2 kg (340 lb)  03/16/17 (!) 142.9 kg (315 lb)  06/08/16 (!) 142.9 kg (315 lb)    EKG: NSR, anterior and lateral STE.  Physical Exam: Blood pressure (!) 148/89, pulse 88, temperature 98 F (36.7 C), temperature source Oral, resp. rate 20, height 5\' 10"  (1.778 m), weight (!) 154.2 kg (340 lb), SpO2 98 %. Body mass index is 48.78 kg/m. General: Obese, pleasant lady, in no acute distress. Head: Normocephalic, atraumatic, sclera non-icteric, no xanthomas, nares are without discharge.  Neck: Negative for carotid bruits. JVD not elevated. Lungs: Clear bilaterally to auscultation without wheezes, rales, or rhonchi. Breathing is unlabored. Heart: RRR with S1 S2. No murmurs, rubs, or gallops appreciated. Abdomen: Soft, non-tender, non-distended with  normoactive bowel sounds. No hepatomegaly. No rebound/guarding. No obvious abdominal masses. Msk:  Strength and tone appear normal for age. Extremities: No clubbing or cyanosis. No edema.  Distal pedal pulses are 2+ and equal bilaterally. Neuro: Alert and oriented X 3. No focal deficit. No facial asymmetry. Moves all extremities spontaneously. Psych:  Responds to questions appropriately with a normal affect.    Assessment and Plan  39F with HTN, obesity, asthma and mitral valve prolapse, who presents with anterolateral STEMI.  We will plan to take her emergently for cardiac catheterization and possible PCI.    Signed, Esmond PlantsJaidip Lynnmarie Lovett, MD 11/01/2017, 2:03 AM

## 2017-11-01 NOTE — Progress Notes (Signed)
   11/01/17 0200  Clinical Encounter Type  Visited With Family  Visit Type Spiritual support  Referral From Nurse   Visited with patient's husband and sister in the ED waiting room. Offered support. Because they were awaiting more family members, the nurse at reception and I chose not to relocate them to [facility].

## 2017-11-01 NOTE — Progress Notes (Signed)
  Echocardiogram 2D Echocardiogram has been performed.  Alyssa Phillips 11/01/2017, 5:21 PM

## 2017-11-01 NOTE — ED Notes (Signed)
Patient transported to the Cath Lab, report given to RNs in the cath lab.  Patient belongings taken with the patient to the cath lab.

## 2017-11-01 NOTE — Plan of Care (Signed)
  Problem: Education: Goal: Knowledge of General Education information will improve Description Including pain rating scale, medication(s)/side effects and non-pharmacologic comfort measures Outcome: Progressing   

## 2017-11-01 NOTE — Plan of Care (Signed)
  Problem: Education: Goal: Knowledge of General Education information will improve Description Including pain rating scale, medication(s)/side effects and non-pharmacologic comfort measures Outcome: Progressing   Problem: Health Behavior/Discharge Planning: Goal: Ability to manage health-related needs will improve Outcome: Progressing   Problem: Clinical Measurements: Goal: Ability to maintain clinical measurements within normal limits will improve Outcome: Progressing Goal: Will remain free from infection Outcome: Progressing Goal: Diagnostic test results will improve Outcome: Progressing Goal: Respiratory complications will improve Outcome: Progressing Goal: Cardiovascular complication will be avoided Outcome: Progressing   Problem: Activity: Goal: Risk for activity intolerance will decrease Outcome: Progressing   Problem: Nutrition: Goal: Adequate nutrition will be maintained Outcome: Progressing   Problem: Coping: Goal: Level of anxiety will decrease Outcome: Progressing   Problem: Elimination: Goal: Will not experience complications related to bowel motility Outcome: Progressing Goal: Will not experience complications related to urinary retention Outcome: Progressing   Problem: Pain Managment: Goal: General experience of comfort will improve Outcome: Progressing   Problem: Safety: Goal: Ability to remain free from injury will improve Outcome: Progressing   Problem: Skin Integrity: Goal: Risk for impaired skin integrity will decrease Outcome: Progressing   Problem: Education: Goal: Understanding of cardiac disease, CV risk reduction, and recovery process will improve Outcome: Progressing Goal: Understanding of medication regimen will improve Outcome: Progressing   Problem: Activity: Goal: Ability to tolerate increased activity will improve Outcome: Progressing   Problem: Cardiac: Goal: Ability to achieve and maintain adequate cardiopulmonary  perfusion will improve Outcome: Progressing   Problem: Health Behavior/Discharge Planning: Goal: Ability to safely manage health-related needs after discharge will improve Outcome: Progressing   Problem: Cardiac: Goal: Vascular access site(s) Level 0-1 will be maintained Outcome: Completed/Met   Problem: Education: Goal: Individualized Educational Video(s) Outcome: Not Applicable

## 2017-11-01 NOTE — Progress Notes (Signed)
  Echocardiogram 2D Echocardiogram has been performed.  Alyssa SkeenVijay  Geni Phillips 11/01/2017, 2:36 PM

## 2017-11-01 NOTE — Care Management Note (Addendum)
Case Management Note  Patient Details  Name: Alyssa Phillips MRN: [id number] Date of Birth: [birth year]  Subjective/Objective:                    Action/Plan:  PTA independent from home with husband.  Pt informed Cm that she has active insurance with [employer] - husband to bring in card today.  [facility] [place].  Pt requested to contact [place] to discuss which card can be used and the amount of the deductible not yet met.  CM submitted benefit check and provide pt free 30 day card and reduced copay card for Brilinta   Expected Discharge Date:                  Expected Discharge Plan:  Home/Self Care  In-House Referral:     Discharge planning Services  CM Consult  Post Acute Care Choice:    Choice offered to:     DME Arranged:    DME Agency:     HH Arranged:    HH Agency:     Status of Service:     If discussed at [employer], dates discussed:    Additional Comments: Benefit Check BRILINTA 90 MG BID  COVER- YES  CO-PAY- $ 100.00  TIER- NO  PRIOR APPROVAL- NO  NO DEDUCTIBLE   PREFERRED PHARMACY :  [facility]  [facility]  Maryclare Labrador, RN 11/01/2017, 11:56 AM

## 2017-11-01 NOTE — ED Triage Notes (Signed)
Patient from home with chest pain that woke her from her sleep.  Given 1 nitro and 324 ASA by EMS.  Pain went from 10/10 to 1/10.  No breathing issues and no cardiac hx in the family.  A&Ox4.

## 2017-11-01 NOTE — Progress Notes (Addendum)
Progress Note  Patient Name: Alyssa Phillips Date of Encounter: 11/01/2017  Primary Cardiologist: No primary care provider on file.  Subjective   Feeling well this morning.   Inpatient Medications    Scheduled Meds: . aspirin EC  81 mg Oral Daily  . atorvastatin  80 mg Oral q1800  . cetirizine  10 mg Oral QPM  . cholecalciferol  1,000 Units Oral BID  . [START ON 11/02/2017] enoxaparin (LOVENOX) injection  40 mg Subcutaneous Q24H  . metoprolol tartrate  25 mg Oral BID  . mometasone-formoterol  2 puff Inhalation BID  . montelukast  10 mg Oral QHS  . multivitamin with minerals  1 tablet Oral Daily  . sodium chloride flush  3 mL Intravenous Q12H  . ticagrelor  90 mg Oral BID   Continuous Infusions: . sodium chloride 154 mL/hr at 11/01/17 0700  . sodium chloride     PRN Meds: sodium chloride, acetaminophen, hydrALAZINE, labetalol, ondansetron (ZOFRAN) IV, sodium chloride flush   Vital Signs    Vitals:   11/01/17 0640 11/01/17 0700 11/01/17 0740 11/01/17 0755  BP: 136/87 139/79    Pulse: 61 60    Resp: 17 11    Temp:   98.8 F (37.1 C)   TempSrc:   Oral   SpO2: 98% 98%  96%  Weight:      Height:        Intake/Output Summary (Last 24 hours) at 11/01/2017 0848 Last data filed at 11/01/2017 0700 Gross per 24 hour  Intake 742.49 ml  Output -  Net 742.49 ml   Filed Weights   11/01/17 0152  Weight: (!) 340 lb (154.2 kg)    Telemetry    SR - Personally Reviewed  ECG    SR - Personally Reviewed  Physical Exam   General: Well developed, well nourished, female appearing in no acute distress. Head: Normocephalic, atraumatic.  Neck: Supple without bruits, JVD. Lungs:  Resp regular and unlabored, CTA. Heart: RRR, S1, S2, no S3, S4, or murmur; no rub. Abdomen: Soft, non-tender, non-distended with normoactive bowel sounds. Extremities: No clubbing, cyanosis, edema. Distal pedal pulses are 2+ bilaterally. Right radial site stable, TR band in place with mild  bruising.  Neuro: Alert and oriented X 3. Moves all extremities spontaneously. Psych: Normal affect.  Labs    Chemistry Recent Labs  Lab 11/01/17 0201 11/01/17 0611  NA 142 143  K 4.0 3.8  CL 109 112*  CO2 21* 23  GLUCOSE 136* 96  BUN 22 16  CREATININE 0.79 0.69  CALCIUM 10.3 9.9  PROT 6.7  --   ALBUMIN 3.8  --   AST 20  --   ALT 30  --   ALKPHOS 61  --   BILITOT 0.5  --   GFRNONAA >60 >60  GFRAA >60 >60  ANIONGAP 12 8     Hematology Recent Labs  Lab 11/01/17 0201 11/01/17 0611  WBC 10.3 11.2*  RBC 5.28* 4.79  HGB 15.5* 14.3  HCT 50.3* 44.6  MCV 95.3 93.1  MCH 29.4 29.9  MCHC 30.8 32.1  RDW 13.2 13.1  PLT 262 261    Cardiac Enzymes Recent Labs  Lab 11/01/17 0201  TROPONINI 0.10*   No results for input(s): TROPIPOC in the last 168 hours.   BNPNo results for input(s): BNP, PROBNP in the last 168 hours.   DDimer No results for input(s): DDIMER in the last 168 hours.    Radiology    No results found.  Cardiac Studies   Cath: 11/01/17   Ost 1st Mrg lesion is 40% stenosed.  Prox LAD lesion is 95% stenosed.  Post intervention, there is a 0% residual stenosis.  A drug-eluting stent was successfully placed using a STENT RESOLUTE ONYX 4.5X12.  There is mild left ventricular systolic dysfunction.  LV end diastolic pressure is mildly elevated.  The left ventricular ejection fraction is 50-55% by visual estimate.   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 Mildly impaired LV function. EF 45-50% 3. Mildly elevated LVEDP 4. Successful PCI of the proximal LAD with DES x 1   Recommend uninterrupted dual antiplatelet therapy with Aspirin 81mg  daily and Ticagrelor 90mg  twice daily for a minimum of 12 months (ACS - Class I recommendation).  Patient Profile     62 y.o. female with history of HTN, asthma, mitral valve prolapse (no TTE reports available) who presents with chest pain.   Assessment & Plan    1. STEMI: Underwent cardiac  cath noted above with 95% pLAD, successful PCI/DES x1. Trop currently at 0.10. Plan for DAPT with ASA/Brilinta for at least one year. No further chest pain this morning. On ASA, statin, BB and Brilinta. LV gram noted around 45-50%. Cardiac rehab today. -- echo pending  2. HL: LDL 166, on statin.   3. HTN: on metoprolol. Will add low dose ACEi today.   Signed, Laverda PageLindsay Roberts, NP  11/01/2017, 8:48 AM  Pager # [phone]   I have examined the patient and reviewed assessment and plan and discussed with patient.  Agree with above as stated.  Doing well.  Possible discharge tomorrow. Meds as outlined above.  Lance MussJayadeep Hermila Millis   For questions or updates, please contact [facility] Please consult [email or web address] for contact info under Cardiology/STEMI.

## 2017-11-01 NOTE — ED Provider Notes (Signed)
[name] Provider Note  CSN: [id number] Arrival date & time: 11/01/17 0140  Chief Complaint(s) Chest Pain and Code STEMI  HPI Alyssa Phillips is a 62 y.o. female with a history of hypertension presents to the emergency department with substernal chest pain that began approximately 1-1/2 hours prior to arrival.  Patient also with mild shortness of breath.  No nausea or vomiting.  No abdominal pain.  No headache.  EMS was called and noted that patient had ST segment elevation in the EKG.  Code STEMI was initiated.  She received 1 nitroglycerin and 324 of aspirin.  Significant improvement in her chest pain.  The history is provided by the patient.    Past Medical History Past Medical History:  Diagnosis Date  . Arthritis    Osteoarthrits-knees-hx. RTKA  . Asthma    environmental agents induced asthma  . Family history of adverse reaction to anesthesia    daughter very sensitive to anesthesia  . GERD (gastroesophageal reflux disease)   . Heart murmur    hx. mitral valve proplapse-mostly asymptomatic  . Hypertension   . Mitral valve prolapse   . Vitamin D deficiency    Patient Active Problem List   Diagnosis Date Noted  . Acute ST elevation myocardial infarction (STEMI) involving left anterior descending (LAD) coronary artery (HCC) 11/01/2017  . OA (osteoarthritis) of knee 05/10/2012   Home Medication(s) Prior to Admission medications   Medication Sig Start Date End Date Taking? Authorizing Provider  acetaminophen (TYLENOL) 325 MG tablet Take 325 mg by mouth every 6 (six) hours as needed.    [provider]  albuterol (PROVENTIL HFA;VENTOLIN HFA) 108 (90 Base) MCG/ACT inhaler Inhale 2 puffs into the lungs every 6 (six) hours as needed for wheezing or shortness of breath.    [provider]  Bepotastine Besilate (BEPREVE) 1.5 % SOLN Place 1 drop into both eyes daily as needed. For itchy eyes    [provider]    budesonide-formoterol (SYMBICORT) 160-4.5 MCG/ACT inhaler Inhale 2 puffs into the lungs 2 (two) times daily.    [provider]  cephALEXin (KEFLEX) 500 MG capsule Take 1 capsule (500 mg total) by mouth 4 (four) times daily. 03/16/17   Cathren LaineSteinl, Kevin, MD  cholecalciferol (VITAMIN D) 1000 units tablet Take 1,000 Units by mouth 2 (two) times daily.    [provider]  hydrochlorothiazide (HYDRODIURIL) 25 MG tablet Take 25 mg by mouth daily.    [provider]  levalbuterol Pauline Aus(XOPENEX HFA) 45 MCG/ACT inhaler Inhale 2 puffs into the lungs every 4 (four) hours as needed for wheezing.    [provider]  levalbuterol Pauline Aus(XOPENEX) 0.63 MG/3ML nebulizer solution Take 0.63 mg by nebulization every 4 (four) hours as needed for wheezing or shortness of breath.    [provider]  levocetirizine (XYZAL) 5 MG tablet Take 5 mg by mouth every evening.    [provider]  methocarbamol (ROBAXIN) 500 MG tablet Take 500 mg by mouth 3 (three) times daily.    [provider]  mometasone (NASONEX) 50 MCG/ACT nasal spray Place 2 sprays into the nose every morning.    [provider]  mometasone-formoterol (DULERA) 100-5 MCG/ACT AERO Inhale 2 puffs into the lungs 2 (two) times daily.    [provider]  montelukast (SINGULAIR) 10 MG tablet Take 10 mg by mouth at bedtime.    [provider]  Multiple Vitamin (MULTIVITAMIN) tablet Take 1 tablet by mouth daily.    [provider]  naproxen sodium (ANAPROX) 220 MG tablet Take 220 mg by mouth 2 (two) times daily with a meal. Pt has been taking 1 a day for past week.    [provider]  traMADol (ULTRAM) 50 MG tablet Take 50-100 mg by mouth every 6 (six) hours as needed. For pain    [provider]  traMADol (ULTRAM) 50 MG tablet Take 1 tablet (50 mg total) by mouth every 6 (six) hours as needed. 10/19/13   Nelva Nay, MD  Vitamin D, Ergocalciferol, (DRISDOL) 50000  UNITS CAPS Take 50,000 Units by mouth every Saturday at 6 PM.    [provider]                                                                                                                                    Past Surgical History Past Surgical History:  Procedure Laterality Date  . CHOLECYSTECTOMY    . COLONOSCOPY WITH PROPOFOL N/A 06/08/2016   Procedure: COLONOSCOPY WITH PROPOFOL;  Surgeon: Dorena Cookey, MD;  Location: [facility] ENDOSCOPY;  Service: Endoscopy;  Laterality: N/A;  . HERNIA REPAIR     umbilical  . JOINT REPLACEMENT  05-02-12   7'12 -s/p RTKA  . KNEE ARTHROSCOPY  05-02-12   x2 right/ x1 left  . TOTAL KNEE ARTHROPLASTY  05/10/2012   Procedure: TOTAL KNEE ARTHROPLASTY;  Surgeon: Loanne Drilling, MD;  Location: [facility] ORS;  Service: Orthopedics;  Laterality: Left;  . TUBAL LIGATION    . UMBILICAL HERNIA REPAIR     Family History Family History  Problem Relation Age of Onset  . Hypertension Mother   . Arthritis Mother   . Lymphoma Mother   . COPD Father     Social History Social History   Tobacco Use  . Smoking status: Never Smoker  . Smokeless tobacco: Never Used  Substance Use Topics  . Alcohol use: Yes    Comment: occ. rare  . Drug use: No   Allergies Advil [ibuprofen]; Aspirin; and Compazine [prochlorperazine edisylate]  Review of Systems Review of Systems All other systems are reviewed and are negative for acute change except as noted in the HPI  Physical Exam Vital Signs  I have reviewed the triage vital signs BP 139/79   Pulse 60   Temp 98.8 F (37.1 C) (Oral)   Resp 11   Ht 5\' 10"  (1.778 m)   Wt (!) 154.2 kg (340 lb)   SpO2 96%   BMI 48.78 kg/m   Physical Exam  Constitutional: She is oriented to person, place, and time. She appears well-developed and well-nourished. No distress.  HENT:  Head: Normocephalic and atraumatic.  Right Ear: External ear normal.  Left Ear: External ear normal.  Nose: Nose normal.  Eyes: Conjunctivae and EOM are  normal. No scleral icterus.  Neck: Normal range of motion and phonation normal.  Cardiovascular: Normal rate and regular rhythm.  Pulmonary/Chest: Effort normal.  No stridor. No respiratory distress.  Abdominal: She exhibits no distension.  Musculoskeletal: Normal range of motion. She exhibits no edema.  Neurological: She is alert and oriented to person, place, and time.  Skin: She is not diaphoretic.  Psychiatric: She has a normal mood and affect. Her behavior is normal.  Vitals reviewed.   ED Results and Treatments Labs (all labs ordered are listed, but only abnormal results are displayed) Labs Reviewed  CBC WITH DIFFERENTIAL/PLATELET - Abnormal; Notable for the following components:      Result Value   RBC 5.28 (*)    Hemoglobin 15.5 (*)    HCT 50.3 (*)    All other components within normal limits  APTT - Abnormal; Notable for the following components:   aPTT 108 (*)    All other components within normal limits  COMPREHENSIVE METABOLIC PANEL - Abnormal; Notable for the following components:   CO2 21 (*)    Glucose, Bld 136 (*)    All other components within normal limits  TROPONIN I - Abnormal; Notable for the following components:   Troponin I 0.10 (*)    All other components within normal limits  LIPID PANEL - Abnormal; Notable for the following components:   Cholesterol 240 (*)    LDL Cholesterol 166 (*)    All other components within normal limits  BASIC METABOLIC PANEL - Abnormal; Notable for the following components:   Chloride 112 (*)    All other components within normal limits  CBC - Abnormal; Notable for the following components:   WBC 11.2 (*)    All other components within normal limits  MRSA PCR SCREENING  PROTIME-INR                                                                                                                         EKG  EKG Interpretation  Date/Time:  Thursday November 01 2017 01:46:46 EDT Ventricular Rate:  89 PR Interval:    QRS  Duration: 100 QT Interval:  371 QTC Calculation: 452 R Axis:   -16 Text Interpretation:  Sinus rhythm Probable left atrial enlargement Borderline left axis deviation Low voltage, precordial leads Abnormal R-wave progression, early transition Nonspecific T abnormalities, anterior leads Minimal ST elevation, lateral leads Confirmed by Drema Pry [phone]) on 11/01/2017 8:15:54 AM      Radiology No results found. Pertinent labs & imaging results that were available during my care of the patient were reviewed by me and considered in my medical decision making (see chart for details).  Medications Ordered in ED Medications  0.9 %  sodium chloride infusion ( Intravenous Rate/Dose Verify 11/01/17 0700)  mometasone-formoterol (DULERA) 200-5 MCG/ACT inhaler 2 puff (2 puffs Inhalation Given 11/01/17 0755)  cholecalciferol (VITAMIN D) tablet 1,000 Units (has no administration in time range)  cetirizine (ZYRTEC) tablet 10 mg (has no administration in time range)  montelukast (SINGULAIR) tablet 10 mg (has no administration in time range)  multivitamin with minerals tablet 1 tablet (has no  administration in time range)  labetalol (NORMODYNE,TRANDATE) injection 10 mg (has no administration in time range)  hydrALAZINE (APRESOLINE) injection 5 mg (has no administration in time range)  acetaminophen (TYLENOL) tablet 650 mg (has no administration in time range)  ondansetron (ZOFRAN) injection 4 mg (has no administration in time range)  enoxaparin (LOVENOX) injection 40 mg (has no administration in time range)  0.9% sodium chloride infusion (1 mL/kg/hr  154.2 kg Intravenous New Bag/Given 11/01/17 0354)  sodium chloride flush (NS) 0.9 % injection 3 mL (has no administration in time range)  sodium chloride flush (NS) 0.9 % injection 3 mL (has no administration in time range)  0.9 %  sodium chloride infusion (has no administration in time range)  atorvastatin (LIPITOR) tablet 80 mg (has no administration in  time range)  aspirin EC tablet 81 mg (has no administration in time range)  ticagrelor (BRILINTA) tablet 90 mg (has no administration in time range)  metoprolol tartrate (LOPRESSOR) tablet 25 mg (25 mg Oral Given 11/01/17 0359)  heparin injection 4,000 Units (4,000 Units Intravenous Given 11/01/17 0149)                                                                                                                                    Procedures Procedures CRITICAL CARE Performed by: Amadeo Garnet Cardama Total critical care time: 20 minutes Critical care time was exclusive of separately billable procedures and treating other patients. Critical care was necessary to treat or prevent imminent or life-threatening deterioration. Critical care was time spent personally by me on the following activities: development of treatment plan with patient and/or surrogate as well as nursing, discussions with consultants, evaluation of patient's response to treatment, examination of patient, obtaining history from patient or surrogate, ordering and performing treatments and interventions, ordering and review of laboratory studies, ordering and review of radiographic studies, pulse oximetry and re-evaluation of patient's condition.   (including critical care time)  Medical Decision Making / ED Course I have reviewed the nursing notes for this encounter and the patient's prior records (if available in EHR or on provided paperwork).    EKG from EMS is consistent with ST segment elevation in the inferior and lateral leads.  Appears to have improved on our EKG.  Cardiology at bedside and will take the patient to Cath Lab.    Final Clinical Impression(s) / ED Diagnoses Final diagnoses:  ST elevation myocardial infarction (STEMI), unspecified artery (HCC)      This chart was dictated using voice recognition software.  Despite best efforts to proofread,  errors can occur which can change the documentation  meaning.   Nira Conn, MD 11/01/17 [phone]

## 2017-11-01 NOTE — Care Management (Signed)
Brilinta Benefit check  1. S/W  SHANE @ [facility] # [phone]   BRILINTA 90 MG BID  COVER- YES  CO-PAY- $ 100.00  TIER- NO  PRIOR APPROVAL- NO  NO DEDUCTIBLE   PREFERRED PHARMACY :  YES  [facility]

## 2017-11-01 NOTE — Progress Notes (Signed)
[id number] Did not walk as pt still with wrist band. Staff can get up later. MI ed completed with pt and mother. Understanding voiced. Stressed importance of brilinta with stent. Needs to see case manager. Reviewed NTG use, MI restrictions, heart healthy food choices, ex ed and CRP 2. Referred to [place] program. Will follow up tomorrow for ambulation. Luetta NuttingCharlene Taegen Lennox RN BSN 11/01/2017 11:43 AM

## 2017-11-02 DIAGNOSIS — E782 Mixed hyperlipidemia: Secondary | ICD-10-CM

## 2017-11-02 LAB — POCT I-STAT, CHEM 8
BUN: 22 mg/dL (ref 8–23)
CHLORIDE: 108 mmol/L (ref 98–111)
Calcium, Ion: 1.39 mmol/L (ref 1.15–1.40)
Creatinine, Ser: 0.6 mg/dL (ref 0.44–1.00)
Glucose, Bld: 138 mg/dL — ABNORMAL HIGH (ref 70–99)
HEMATOCRIT: 42 % (ref 36.0–46.0)
Hemoglobin: 14.3 g/dL (ref 12.0–15.0)
POTASSIUM: 3.8 mmol/L (ref 3.5–5.1)
SODIUM: 142 mmol/L (ref 135–145)
TCO2: 22 mmol/L (ref 22–32)

## 2017-11-02 LAB — BASIC METABOLIC PANEL
ANION GAP: 7 (ref 5–15)
BUN: 14 mg/dL (ref 8–23)
CHLORIDE: 109 mmol/L (ref 98–111)
CO2: 24 mmol/L (ref 22–32)
Calcium: 10.2 mg/dL (ref 8.9–10.3)
Creatinine, Ser: 0.71 mg/dL (ref 0.44–1.00)
GFR calc non Af Amer: >60 mL/min (ref >60)
Glucose, Bld: 97 mg/dL (ref 70–99)
POTASSIUM: 4 mmol/L (ref 3.5–5.1)
SODIUM: 140 mmol/L (ref 135–145)

## 2017-11-02 LAB — POCT ACTIVATED CLOTTING TIME: Activated Clotting Time: 384 seconds

## 2017-11-02 MED ORDER — METOPROLOL SUCCINATE ER 50 MG PO TB24
50.0000 mg | ORAL_TABLET | Freq: Every day | ORAL | Status: DC
  Administered 2017-11-02: 50 mg via ORAL
  Filled 2017-11-02: qty 1

## 2017-11-02 MED ORDER — TICAGRELOR 90 MG PO TABS: 90.0000 mg | ORAL_TABLET | Freq: Two times a day (BID) | ORAL | 3 refills | Status: DC

## 2017-11-02 MED ORDER — ALBUTEROL SULFATE (2.5 MG/3ML) 0.083% IN NEBU: 2.5000 mg | INHALATION_SOLUTION | Freq: Four times a day (QID) | RESPIRATORY_TRACT | Status: DC | PRN

## 2017-11-02 MED ORDER — ASPIRIN 81 MG PO TBEC: 81.0000 mg | DELAYED_RELEASE_TABLET | Freq: Every day | ORAL | Status: AC

## 2017-11-02 MED ORDER — NITROGLYCERIN 0.4 MG SL SUBL: 0.4000 mg | SUBLINGUAL_TABLET | SUBLINGUAL | 2 refills | Status: DC | PRN

## 2017-11-02 MED ORDER — TICAGRELOR 90 MG PO TABS: 90.0000 mg | ORAL_TABLET | Freq: Two times a day (BID) | ORAL | 0 refills | Status: DC

## 2017-11-02 MED ORDER — LISINOPRIL 5 MG PO TABS: 5.0000 mg | ORAL_TABLET | Freq: Every day | ORAL | 5 refills | Status: DC

## 2017-11-02 MED ORDER — ATORVASTATIN CALCIUM 80 MG PO TABS: 80.0000 mg | ORAL_TABLET | Freq: Every day | ORAL | 5 refills | Status: DC

## 2017-11-02 MED ORDER — METOPROLOL SUCCINATE ER 50 MG PO TB24: 50.0000 mg | ORAL_TABLET | Freq: Every day | ORAL | 5 refills | Status: DC

## 2017-11-02 MED FILL — Heparin Sod (Porcine)-NaCl IV Soln 1000 Unit/500ML-0.9%: INTRAVENOUS | Qty: 500 | Status: AC

## 2017-11-02 NOTE — Progress Notes (Addendum)
Progress Note  Patient Name: Alyssa Phillips Date of Encounter: 11/02/2017  Primary Cardiologist: Peter Sw[place], MD   Subjective   Doing ok this morning. Denies CP. No dyspnea. Tolerating new meds ok w/o side effects.   Inpatient Medications    Scheduled Meds: . aspirin EC  81 mg Oral Daily  . atorvastatin  80 mg Oral q1800  . cetirizine  10 mg Oral QPM  . cholecalciferol  1,000 Units Oral BID  . enoxaparin (LOVENOX) injection  40 mg Subcutaneous Q24H  . lisinopril  5 mg Oral Daily  . metoprolol succinate  50 mg Oral Daily  . mometasone-formoterol  2 puff Inhalation BID  . montelukast  10 mg Oral QHS  . multivitamin with minerals  1 tablet Oral Daily  . sodium chloride flush  3 mL Intravenous Q12H  . ticagrelor  90 mg Oral BID   Continuous Infusions: . sodium chloride Stopped (11/01/17 0952)  . sodium chloride     PRN Meds: sodium chloride, acetaminophen, albuterol, ondansetron (ZOFRAN) IV, sodium chloride flush   Vital Signs    Vitals:   11/02/17 0747 11/02/17 0800 11/02/17 0822 11/02/17 0900  BP:  (!) 136/92  126/82  Pulse:      Resp:  (!) 21  14  Temp: 98.6 F (37 C)     TempSrc: Oral     SpO2:   95%   Weight:      Height:        Intake/Output Summary (Last 24 hours) at 11/02/2017 1011 Last data filed at 11/02/2017 0849 Gross per 24 hour  Intake 751.84 ml  Output 1800 ml  Net -1048.16 ml   Filed Weights   11/01/17 0152  Weight: (!) 340 lb (154.2 kg)    Telemetry    NSR, 70s - Personally Reviewed  ECG    NSR 69 bpm  - Personally Reviewed  Physical Exam   GEN: No acute distress. Morbidly obese WF   Neck: No JVD Cardiac: RRR, no murmurs, rubs, or gallops.  Respiratory: Clear to auscultation bilaterally. GI: Soft, nontender, non-distended  MS: No edema; No deformity. Neuro:  Nonfocal  Psych: Normal affect   Labs    Chemistry Recent Labs  Lab 11/01/17 0201 11/01/17 0219 11/01/17 0611  NA 142 142 143  K 4.0 3.8 3.8  CL 109 108  112*  CO2 21*  --  23  GLUCOSE 136* 138* 96  BUN 22 22 16   CREATININE 0.79 0.60 0.69  CALCIUM 10.3  --  9.9  PROT 6.7  --   --   ALBUMIN 3.8  --   --   AST 20  --   --   ALT 30  --   --   ALKPHOS 61  --   --   BILITOT 0.5  --   --   GFRNONAA >60  --  >60  GFRAA >60  --  >60  ANIONGAP 12  --  8     Hematology Recent Labs  Lab 11/01/17 0201 11/01/17 0219 11/01/17 0611  WBC 10.3  --  11.2*  RBC 5.28*  --  4.79  HGB 15.5* 14.3 14.3  HCT 50.3* 42.0 44.6  MCV 95.3  --  93.1  MCH 29.4  --  29.9  MCHC 30.8  --  32.1  RDW 13.2  --  13.1  PLT 262  --  261    Cardiac Enzymes Recent Labs  Lab 11/01/17 0201  TROPONINI 0.10*   No results  for input(s): TROPIPOC in the last 168 hours.   BNPNo results for input(s): BNP, PROBNP in the last 168 hours.   DDimer No results for input(s): DDIMER in the last 168 hours.   Radiology    No results found.  Cardiac Studies   Cath: 11/01/17   Ost 1st Mrg lesion is 40% stenosed.  Prox LAD lesion is 95% stenosed.  Post intervention, there is a 0% residual stenosis.  A drug-eluting stent was successfully placed using a STENT RESOLUTE ONYX 4.5X12.  There is mild left ventricular systolic dysfunction.  LV end diastolic pressure is mildly elevated.  The left ventricular ejection fraction is 50-55% by visual estimate.  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 Mildly impaired LV function. EF 45-50% 3. Mildly elevated LVEDP 4. Successful PCI of the proximal LAD with DES x 1   Recommend uninterrupted dual antiplatelet therapy with Aspirin 81mg  daily and Ticagrelor 90mg  twice daily for a minimum of 12 months (ACS - Class I recommendation).  2D echo 11/01/17 Study Conclusions  - Left ventricle: The cavity size was normal. There was mild focal   basal hypertrophy of the septum. Systolic function was mildly to   moderately reduced. The estimated ejection fraction was in the   range of 40% to 45%. Wall motion  abnormalities noted below. The   study is indeterminate for the evaluation of LV diastolic   function. Acoustic contrast opacification revealed no evidence   ofthrombus. - Regional wall motion abnormality: Akinesis of the apical anterior   and apical myocardium; hypokinesis of the mid anterior, mid   anteroseptal, apical septal, and apical lateral myocardium. - Aortic valve: There was no regurgitation. - Mitral valve: Structurally normal valve. No echocardiographic   evidence for prolapse. Transvalvular velocity was within the   normal range. There was no evidence for stenosis. There was no   regurgitation. - Tricuspid valve: There was no significant regurgitation. - Pulmonic valve: There was no significant regurgitation.  Impressions:  - -LV EF mild to moderately reduced with focal wall motion   abnormalities. Hypokinesis of anterior and anteroseptal walls and   apex. Echo contrast used to better evaluate wall motion and   exclude LV thrombus.   -No evidence of mitral valve prolapse or regurgitation.   -Technically difficult study. Right sided structures not well   visualized but appear grossly normal.  Patient Profile    62 y.o. Morbidly obese female with history ofHTN, asthma, reported history of mitral valve prolapse (no prior TTE reports available) who presented on 11/01/17 with chest pain. She called EMS quite promptly after the onset of her symptoms.  Upon EMS arrival, she had profound STE in the anterior and lateral leads with reciprocal depression in the inferior leads.  She received SL nitroglycerin with near complete resolution of her CP and ECG changes. A CODE STEMI was activated.  [facility].   Assessment & Plan    1. CAD s/p STEMI: Underwent cardiac cath noted above with 95% pLAD, successful PCI/DES x1. LV gram noted EF around 45-50%. EF by echo 40-45% with focal wall motion abnormalities. Hypokinesis of anterior and  anteroseptal walls and apex. She is CP free. No dyspnea. VSS. No arrhthymias on tele. Plan on DAPT w/ ASA + Brilinta for a minimum of 12 months, along with high intensity statin, BB and ACE-I. Advance cardiac rehab today. Possible d/c home in the next 24 hrs.   2. Systolic HF: EF 40-45% with  focal wall motion abnormalities noted on echo. Hypokinesis of anterior and anteroseptal walls and apex. Lisinopril added yesterday. BP stable. Will monitor renal function. We will change BB from Lopressor to metoprolol succinate, 50 mg daily. Monitor HR. She denies dyspnea. No LEE.   3. HLD: LDL 166 mg/dL. Lipitor 80 mg initiated. She will need a repeat FLP and HFTs in 6-8 weeks. Goal LDL is < 70 mg/dL.   3. HTN: controlled on current regimen. Tolerating ACE-I well. Changing BB from Lopressor to Toprol XL given LV dysfunction. Will monitor BP.     For questions or updates, please contact [facility] Please consult [email or web address] for contact info under Cardiology/STEMI.      Signed, Robbie Lis, PA-C  11/02/2017, 10:11 AM    I have examined the patient and reviewed assessment and plan and discussed with patient.  Agree with above as stated.  Aggressive secondary prevention including weight loss and lipid lowering therapy.BP control as well. Cardiac rehab would be beneficial.  Mild LV dysfunction.  I suspect this will improve over time and may normalize in the next few months.  Lance Muss

## 2017-11-02 NOTE — Discharge Summary (Addendum)
Discharge Summary    Patient ID: Alyssa Phillips,  MRN: [id number], DOB/AGE: 1956/03/25 62 y.o.  Admit date: 11/01/2017 Discharge date: 11/02/2017  Primary Care Provider: Marinda Elk Primary Cardiologist: Peter Swaziland, MD  Discharge Diagnoses    Active Problems:   Acute ST elevation myocardial infarction (STEMI) involving left anterior descending (LAD) coronary artery (HCC)   Allergies Allergies  Allergen Reactions  . Advil [Ibuprofen] Hives  . Aspirin     NEEDS TO EC ASPIRIN  . Compazine [Prochlorperazine Edisylate] Other (See Comments)    Eyes rolled back, tongue curled up    Diagnostic Studies/Procedures    Cath: 11/01/17   Ost 1st Mrg lesion is 40% stenosed.  Prox LAD lesion is 95% stenosed.  Post intervention, there is a 0% residual stenosis.  A drug-eluting stent was successfully placed using a STENT RESOLUTE ONYX 4.5X12.  There is mild left ventricular systolic dysfunction.  LV end diastolic pressure is mildly elevated.  The left ventricular ejection fraction is 50-55% by visual estimate.  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 Mildly impaired LV function. EF 45-50% 3. Mildly elevated LVEDP 4. Successful PCI of the proximal LAD with DES x 1   Recommend uninterrupted dual antiplatelet therapy with Aspirin 81mg  daily and Ticagrelor 90mg  twice daily for a minimum of 12 months (ACS - Class I recommendation).  2D echo 11/01/17 Study Conclusions  - Left ventricle: The cavity size was normal. There was mild focal basal hypertrophy of the septum. Systolic function was mildly to moderately reduced. The estimated ejection fraction was in the range of 40% to 45%. Wall motion abnormalities noted below. The study is indeterminate for the evaluation of LV diastolic function. Acoustic contrast opacification revealed no evidence ofthrombus. - Regional wall motion abnormality: Akinesis of the apical anterior and apical  myocardium; hypokinesis of the mid anterior, mid anteroseptal, apical septal, and apical lateral myocardium. - Aortic valve: There was no regurgitation. - Mitral valve: Structurally normal valve. No echocardiographic evidence for prolapse. Transvalvular velocity was within the normal range. There was no evidence for stenosis. There was no regurgitation. - Tricuspid valve: There was no significant regurgitation. - Pulmonic valve: There was no significant regurgitation.  Impressions:  - -LV EF mild to moderately reduced with focal wall motion abnormalities. Hypokinesis of anterior and anteroseptal walls and apex. Echo contrast used to better evaluate wall motion and exclude LV thrombus. -No evidence of mitral valve prolapse or regurgitation. -Technically difficult study. Right sided structures not well visualized but appear grossly normal.     History of Present Illness     62 y.o. Morbidly obesefemalewith history ofHTN, asthma, reported history of mitral valve prolapse (no prior TTE reports available) who presented on 11/01/17 with chest pain.She called EMS quite promptly after the onset of her symptoms. Upon EMS arrival, she had profound STE in the anterior and lateral leads with reciprocal depression in the inferior leads. She received SL nitroglycerin with near complete resolution of her CP and ECG changes. A CODE STEMI was activated. She was transferred to [facility] for further management.  Hospital Course     Pt was taken to the cath lab by Dr. Swaziland. Cath showed single vessel obstructive CAD with 95% proximal LAD, treated with PCI + DES x 1. LVEF mildly impaired by LV gram at 45-50%, however echo showed EF of 40-45%. She tolerated the procedure well and left the cath lab in stable condition. She was transferred to the ICU. CP resolved. Max troponin  0.10. She was placed on DAPT w/ ASA and Brilitna, high dose intensity statin for LDL of 166 (goal <70).  Also placed on a BB and ARB. She had no post cath complications. Radial access site with slight hematoma but stable. She ambulated with cardiac rehab w/o exertional symptoms. Vital signs remained stable. She was last seen and examined by Dr. Eldridge DaceVaranasi, who determined she was stable for discharge home. 7-10 day TOC post hospital f/u will be arranged with Dr. SwazilandJordan or an APP on his team.   Consultants: none    Discharge Vitals Blood pressure (!) 148/90, pulse (!) 58, temperature 98.6 F (37 C), temperature source Oral, resp. rate 12, height 5\' 10"  (1.778 m), weight (!) 340 lb (154.2 kg), SpO2 95 %.  Filed Weights   11/01/17 0152  Weight: (!) 340 lb (154.2 kg)    Labs & Radiologic Studies    CBC Recent Labs    11/01/17 0201 11/01/17 0219 11/01/17 0611  WBC 10.3  --  11.2*  NEUTROABS 7.6  --   --   HGB 15.5* 14.3 14.3  HCT 50.3* 42.0 44.6  MCV 95.3  --  93.1  PLT 262  --  261   Basic Metabolic Panel Recent Labs    16/01/9606/18/19 0611 11/02/17 1112  NA 143 140  K 3.8 4.0  CL 112* 109  CO2 23 24  GLUCOSE 96 97  BUN 16 14  CREATININE 0.69 0.71  CALCIUM 9.9 10.2   Liver Function Tests Recent Labs    11/01/17 0201  AST 20  ALT 30  ALKPHOS 61  BILITOT 0.5  PROT 6.7  ALBUMIN 3.8   No results for input(s): LIPASE, AMYLASE in the last 72 hours. Cardiac Enzymes Recent Labs    11/01/17 0201  TROPONINI 0.10*   BNP Invalid input(s): POCBNP D-Dimer No results for input(s): DDIMER in the last 72 hours. Hemoglobin A1C No results for input(s): HGBA1C in the last 72 hours. Fasting Lipid Panel Recent Labs    11/01/17 0201  CHOL 240*  HDL 49  LDLCALC 166*  TRIG 126  CHOLHDL 4.9   Thyroid Function Tests No results for input(s): TSH, T4TOTAL, T3FREE, THYROIDAB in the last 72 hours.  Invalid input(s): FREET3 _____________  No results found. Disposition   Pt is being discharged home today in good condition.  Follow-up Plans & Appointments    Follow-up  Information    SwazilandJordan, Peter M, MD Follow up.   Specialty:  Cardiology Why:  Our office will call you with a hospital follow-up visit.  Contact information: [address] [place] [address] [phone]          Discharge Instructions    Amb Referral to Cardiac Rehabilitation   Complete by:  As directed    Diagnosis:   STEMI Coronary Stents     Diet - low sodium heart healthy   Complete by:  As directed    Increase activity slowly   Complete by:  As directed       Discharge Medications   Allergies as of 11/02/2017      Reactions   Advil [ibuprofen] Hives   Aspirin    NEEDS TO EC ASPIRIN   Compazine [prochlorperazine Edisylate] Other (See Comments)   Eyes rolled back, tongue curled up      Medication List    STOP taking these medications   diclofenac 75 MG EC tablet Commonly known as:  VOLTAREN   hydrochlorothiazide 25 MG tablet Commonly known as:  HYDRODIURIL   traMADol 50 MG tablet Commonly known as:  ULTRAM     TAKE these medications   acetaminophen 325 MG tablet Commonly known as:  TYLENOL Take 325 mg by mouth every 6 (six) hours as needed.   albuterol 108 (90 Base) MCG/ACT inhaler Commonly known as:  PROVENTIL HFA;VENTOLIN HFA Inhale 2 puffs into the lungs every 6 (six) hours as needed for wheezing or shortness of breath.   aspirin 81 MG EC tablet Take 1 tablet (81 mg total) by mouth daily. Start taking on:  11/03/2017   atorvastatin 80 MG tablet Commonly known as:  LIPITOR Take 1 tablet (80 mg total) by mouth daily at 6 PM.   BEPREVE 1.5 % Soln Generic drug:  Bepotastine Besilate Place 1 drop into both eyes daily as needed. For itchy eyes   budesonide-formoterol 160-4.5 MCG/ACT inhaler Commonly known as:  SYMBICORT Inhale 2 puffs into the lungs 2 (two) times daily.   cholecalciferol 1000 units tablet Commonly known as:  VITAMIN D Take 1,000 Units by mouth 2 (two) times daily.   levalbuterol 0.63 MG/3ML nebulizer  solution Commonly known as:  XOPENEX Take 0.63 mg by nebulization every 4 (four) hours as needed for wheezing or shortness of breath.   levocetirizine 5 MG tablet Commonly known as:  XYZAL Take 5 mg by mouth every evening.   lisinopril 5 MG tablet Commonly known as:  PRINIVIL,ZESTRIL Take 1 tablet (5 mg total) by mouth daily. Start taking on:  11/03/2017   metoprolol succinate 50 MG 24 hr tablet Commonly known as:  TOPROL-XL Take 1 tablet (50 mg total) by mouth daily. Take with or immediately following a meal. Start taking on:  11/03/2017   mometasone 50 MCG/ACT nasal spray Commonly known as:  NASONEX Place 2 sprays into the nose every morning.   montelukast 10 MG tablet Commonly known as:  SINGULAIR Take 10 mg by mouth at bedtime.   multivitamin tablet Take 1 tablet by mouth daily.   nitroGLYCERIN 0.4 MG SL tablet Commonly known as:  NITROSTAT Place 1 tablet (0.4 mg total) under the tongue every 5 (five) minutes as needed for chest pain.   ticagrelor 90 MG Tabs tablet Commonly known as:  BRILINTA Take 1 tablet (90 mg total) by mouth 2 (two) times daily.   ticagrelor 90 MG Tabs tablet Commonly known as:  BRILINTA Take 1 tablet (90 mg total) by mouth 2 (two) times daily.   XOPENEX HFA 45 MCG/ACT inhaler Generic drug:  levalbuterol Inhale 2 puffs into the lungs every 4 (four) hours as needed for wheezing.        Acute coronary syndrome (MI, NSTEMI, STEMI, etc) this admission?: Yes.     AHA/ACC Clinical Performance & Quality Measures: 1. Aspirin prescribed? - Yes 2. ADP Receptor Inhibitor (Plavix/Clopidogrel, Brilinta/Ticagrelor or Effient/Prasugrel) prescribed (includes medically managed patients)? - Yes 3. Beta Blocker prescribed? - Yes 4. High Intensity Statin (Lipitor 40-80mg  or Crestor 20-40mg ) prescribed? - Yes 5. EF assessed during THIS hospitalization? - Yes 6. For EF <40%, was ACEI/ARB prescribed? - Yes 7. For EF <40%, Aldosterone Antagonist  (Spironolactone or Eplerenone) prescribed? - Not Applicable (EF >/= 40%) 8. Cardiac Rehab Phase II ordered (Included Medically managed Patients)? - Yes     Outstanding Labs/Studies   Repeat FLP and HFTs in 6-8 weeks; BMP (medication monitoring>> lisinopril)  Duration of Discharge Encounter   Greater than 30 minutes including physician time.  Signed, Robbie Lis, PA-C 11/02/2017, 2:34 PM  I have examined the patient  and reviewed assessment and plan and discussed with patient.  Agree with above as stated.  Aggressive secondary prevention including weight loss and lipid lowering therapy.BP control as well. Cardiac rehab would be beneficial.  Mild LV dysfunction.  I suspect this will improve over time and may normalize in the next few months.  Lance Muss

## 2017-11-02 NOTE — Progress Notes (Signed)
CARDIAC REHAB PHASE I   PRE:  Rate/Rhythm: 72 SR  BP:  Supine: 141/79  Sitting:   Standing:    SaO2: 94%RA  MODE:  Ambulation: 370 ft   POST:  Rate/Rhythm: 101 ST  BP:  Supine:   Sitting: 162/94  Standing:    SaO2: 98%RA 1035-1059 Pt walked 370 ft on RA with steady gait. Tolerated well. No complaints. To recliner after walk. No questions re ed done yesterday.   Luetta Nuttingharlene Aisley Whan, RN BSN  11/02/2017 10:54 AM

## 2017-11-05 ENCOUNTER — Telehealth (HOSPITAL_COMMUNITY): Payer: Self-pay

## 2017-11-05 NOTE — Telephone Encounter (Signed)
Called patient to see if she is interested in the Cardiac Rehab Program. Patient expressed interest. Explained scheduling process and went over insurance, patient verbalized understanding. Will contact patient for scheduling once f/u has been completed. 

## 2017-11-05 NOTE — Telephone Encounter (Signed)
Pt insurance is active and benefits verified through [employer]. Co-pay $0.00, DED $2,000.00/$2,000.00 met, out of pocket $13,100.00/$928.37 met, co-insurance 20%. No pre-authorization. Passport, 11/05/17 @ 2:55PM, IDP#[id number]  Will contact patient to see if she is interested in the Cardiac Rehab Program. If interested, patient will need to complete follow up appt. Once completed, patient will be contacted for scheduling upon review by the RN Navigator.

## 2017-11-06 ENCOUNTER — Telehealth: Payer: Self-pay | Admitting: Cardiology

## 2017-11-06 NOTE — Telephone Encounter (Signed)
Pt call on status of previous message. Please call nurse.

## 2017-11-06 NOTE — Telephone Encounter (Signed)
Patient contacted regarding discharge from  on 11/02/17.  Patient understands to follow up with provider Harrell LarkH Meng PA [facility] 11[facility] . Patient understands discharge instructions? yes Patient understands medications and regiment? yes

## 2017-11-06 NOTE — Telephone Encounter (Signed)
New Message      Patient called today asking for a f/u visit. I was not sure when the patient was to follow-up? Pls advise

## 2017-11-08 ENCOUNTER — Encounter: Payer: Self-pay | Admitting: Physician Assistant

## 2017-11-08 ENCOUNTER — Ambulatory Visit: Payer: BLUE CROSS/BLUE SHIELD | Admitting: Physician Assistant

## 2017-11-08 VITALS — BP 108/74 | HR 97 | Ht 69.0 in | Wt 328.0 lb

## 2017-11-08 DIAGNOSIS — E782 Mixed hyperlipidemia: Secondary | ICD-10-CM | POA: Diagnosis not present

## 2017-11-08 DIAGNOSIS — I10 Essential (primary) hypertension: Secondary | ICD-10-CM

## 2017-11-08 DIAGNOSIS — I2510 Atherosclerotic heart disease of native coronary artery without angina pectoris: Secondary | ICD-10-CM | POA: Diagnosis not present

## 2017-11-08 DIAGNOSIS — I2102 ST elevation (STEMI) myocardial infarction involving left anterior descending coronary artery: Secondary | ICD-10-CM | POA: Diagnosis not present

## 2017-11-08 MED ORDER — LEVALBUTEROL TARTRATE 45 MCG/ACT IN AERO: 2.0000 | INHALATION_SPRAY | RESPIRATORY_TRACT | 0 refills | Status: DC | PRN

## 2017-11-08 NOTE — Patient Instructions (Signed)
Medication Instructions:  Your physician recommends that you continue on your current medications as directed. Please refer to the Current Medication list given to you today.  Labwork: Your physician recommends that you return for lab work in: 6-8 wks FASTING LIPID, LFT  Testing/Procedures: None  Follow-Up: Your physician recommends that you schedule a follow-up appointment in: 2-243months with Dr SwazilandJordan.  Any Other Special Instructions Will Be Listed Below (If Applicable). If you need a refill on your cardiac medications before your next appointment, please call your pharmacy.

## 2017-11-08 NOTE — Progress Notes (Signed)
Cardiology Office Note    Date:  11/09/2017   ID:  Alyssa Phillips 04-05-1956, MRN [id number]  PCP:  Alyssa Scales, PA-C  Cardiologist:  Dr. Swaziland  Chief Complaint  Patient presents with  . Follow-up    TOC. Seen for Dr. Swaziland.     History of Present Illness:  Alyssa Phillips is a 62 y.o. female with past medical history of hypertension, HLD, asthma, reported history of mitral valve prolapse who was recently admitted for anterior STEMI on 11/01/2017.  Patient was taken urgently to Cath Lab which showed severe single-vessel disease but 95% proximal LAD treated with DES x1.  EF 45 to 50% by LV gram.  Echocardiogram showed EF 40 to 45%.  Postprocedure, patient was placed on aspirin and Brilinta and the high-dose statin.  She was also placed on beta-blocker and ARB as well.  Patient presents today for 1 week transition of care follow-up.  Her son has been calling to make sure she is taking her medications.  Apparently her son is currently finishing a fellowship program in cosmetic surgery and previously has a degree in maxillofacial surgery.  Patient did have a short episode of chest discomfort on Monday, this quickly resolved within 5 minutes after taking a single nitroglycerin.  She said it may be muscle spasm.  There has been no recurrence of the symptoms since.  Otherwise she has been compliant with medication.  She requested a short prescription for Xopenex as she has albuterol at home.  She will need 6 to 8 weeks fasting lipid panel and LFT.  Otherwise she has no lower extremity edema, orthopnea or PND.  She can follow-up with Dr. Swaziland in 2 to 36-month.   Past Medical History:  Diagnosis Date  . Arthritis    Osteoarthrits-knees-hx. RTKA  . Asthma    environmental agents induced asthma  . Family history of adverse reaction to anesthesia    daughter very sensitive to anesthesia  . GERD (gastroesophageal reflux disease)   . Heart murmur    hx. mitral valve  proplapse-mostly asymptomatic  . Hypertension   . Mitral valve prolapse   . Vitamin D deficiency     Past Surgical History:  Procedure Laterality Date  . CHOLECYSTECTOMY    . COLONOSCOPY WITH PROPOFOL N/A 06/08/2016   Procedure: COLONOSCOPY WITH PROPOFOL;  Surgeon: Dorena Cookey, MD;  Location: [facility] ENDOSCOPY;  Service: Endoscopy;  Laterality: N/A;  . CORONARY STENT INTERVENTION N/A 11/01/2017   Procedure: CORONARY STENT INTERVENTION;  Surgeon: Swaziland, Peter M, MD;  Location: [facility] INVASIVE CV LAB;  Service: Cardiovascular;  Laterality: N/A;  Resolute Onyx 4.5 mm x12 mm  . CORONARY/GRAFT ACUTE MI REVASCULARIZATION N/A 11/01/2017   Procedure: Coronary/Graft Acute MI Revascularization;  Surgeon: Swaziland, Peter M, MD;  Location: [facility] INVASIVE CV LAB;  Service: Cardiovascular;  Laterality: N/A;  . HERNIA REPAIR     umbilical  . JOINT REPLACEMENT  05-02-12   7'12 -s/p RTKA  . KNEE ARTHROSCOPY  05-02-12   x2 right/ x1 left  . LEFT HEART CATH AND CORONARY ANGIOGRAPHY N/A 11/01/2017   Procedure: LEFT HEART CATH AND CORONARY ANGIOGRAPHY;  Surgeon: Swaziland, Peter M, MD;  Location: [facility] INVASIVE CV LAB;  Service: Cardiovascular;  Laterality: N/A;  . TOTAL KNEE ARTHROPLASTY  05/10/2012   Procedure: TOTAL KNEE ARTHROPLASTY;  Surgeon: Loanne Drilling, MD;  Location: [facility] ORS;  Service: Orthopedics;  Laterality: Left;  . TUBAL LIGATION    . UMBILICAL HERNIA REPAIR  Current Medications: Outpatient Medications Prior to Visit  Medication Sig Dispense Refill  . acetaminophen (TYLENOL) 325 MG tablet Take 325 mg by mouth every 6 (six) hours as needed.    Marland Kitchen. albuterol (PROVENTIL HFA;VENTOLIN HFA) 108 (90 Base) MCG/ACT inhaler Inhale 2 puffs into the lungs every 6 (six) hours as needed for wheezing or shortness of breath.    Marland Kitchen. aspirin EC 81 MG EC tablet Take 1 tablet (81 mg total) by mouth daily.    Marland Kitchen. atorvastatin (LIPITOR) 80 MG tablet Take 1 tablet (80 mg total) by mouth daily at 6 PM. 30 tablet 5  . Bepotastine Besilate  (BEPREVE) 1.5 % SOLN Place 1 drop into both eyes daily as needed. For itchy eyes    . budesonide-formoterol (SYMBICORT) 160-4.5 MCG/ACT inhaler Inhale 2 puffs into the lungs 2 (two) times daily.    . cholecalciferol (VITAMIN D) 1000 units tablet Take 1,000 Units by mouth 2 (two) times daily.    Marland Kitchen. levalbuterol (XOPENEX) 0.63 MG/3ML nebulizer solution Take 0.63 mg by nebulization every 4 (four) hours as needed for wheezing or shortness of breath.    . levocetirizine (XYZAL) 5 MG tablet Take 5 mg by mouth every evening.    Marland Kitchen. lisinopril (PRINIVIL,ZESTRIL) 5 MG tablet Take 1 tablet (5 mg total) by mouth daily. 30 tablet 5  . metoprolol succinate (TOPROL-XL) 50 MG 24 hr tablet Take 1 tablet (50 mg total) by mouth daily. Take with or immediately following a meal. 30 tablet 5  . mometasone (NASONEX) 50 MCG/ACT nasal spray Place 2 sprays into the nose every morning.    . montelukast (SINGULAIR) 10 MG tablet Take 10 mg by mouth at bedtime.    . Multiple Vitamin (MULTIVITAMIN) tablet Take 1 tablet by mouth daily.    . nitroGLYCERIN (NITROSTAT) 0.4 MG SL tablet Place 1 tablet (0.4 mg total) under the tongue every 5 (five) minutes as needed for chest pain. 25 tablet 2  . ticagrelor (BRILINTA) 90 MG TABS tablet Take 1 tablet (90 mg total) by mouth 2 (two) times daily. 180 tablet 3  . levalbuterol (XOPENEX HFA) 45 MCG/ACT inhaler Inhale 2 puffs into the lungs every 4 (four) hours as needed for wheezing.    . ticagrelor (BRILINTA) 90 MG TABS tablet Take 1 tablet (90 mg total) by mouth 2 (two) times daily. 60 tablet 0   No facility-administered medications prior to visit.      Allergies:   Advil [ibuprofen]; Aspirin; and Compazine [prochlorperazine edisylate]   Social History   Socioeconomic History  . Marital status: Married    Spouse name: Not on file  . Number of children: Not on file  . Years of education: Not on file  . Highest education level: Not on file  Occupational History  . Not on file    Social Needs  . Financial resource strain: Not on file  . Food insecurity:    Worry: Not on file    Inability: Not on file  . Transportation needs:    Medical: Not on file    Non-medical: Not on file  Tobacco Use  . Smoking status: Never Smoker  . Smokeless tobacco: Never Used  Substance and Sexual Activity  . Alcohol use: Yes    Comment: occ. rare  . Drug use: No  . Sexual activity: Yes  Lifestyle  . Physical activity:    Days per week: Not on file    Minutes per session: Not on file  . Stress: Not on file  Relationships  . Social connections:    Talks on phone: Not on file    Gets together: Not on file    Attends religious service: Not on file    Active member of club or organization: Not on file    Attends meetings of clubs or organizations: Not on file    Relationship status: Not on file  Other Topics Concern  . Not on file  Social History Narrative  . Not on file     Family History:  The patient's family history includes Arthritis in her mother; COPD in her father; Hypertension in her mother; Lymphoma in her mother.   ROS:   Please see the history of present illness.    ROS All other systems reviewed and are negative.   PHYSICAL EXAM:   VS:  BP 108/74 (BP Location: Left Arm, Patient Position: Sitting, Cuff Size: Large)   Pulse 97   Ht 5\' 9"  (1.753 m)   Wt (!) 328 lb (148.8 kg)   BMI 48.44 kg/m    GEN: Well nourished, well developed, in no acute distress  HEENT: normal  Neck: no JVD, carotid bruits, or masses Cardiac: RRR; no murmurs, rubs, or gallops,no edema  Respiratory:  clear to auscultation bilaterally, normal work of breathing GI: soft, nontender, nondistended, + BS MS: no deformity or atrophy  Skin: warm and dry, no rash Neuro:  Alert and Oriented x 3, Strength and sensation are intact Psych: euthymic mood, full affect  Wt Readings from Last 3 Encounters:  11/08/17 (!) 328 lb (148.8 kg)  11/01/17 (!) 340 lb (154.2 kg)  03/16/17 (!) 315 lb  (142.9 kg)      Studies/Labs Reviewed:   EKG:  EKG is ordered today.  The ekg ordered today demonstrates normal sinus rhythm, T wave inversion in lead V1 and V2, poor R wave progression anterior leads.  Recent Labs: 11/01/2017: ALT 30; Hemoglobin 14.3; Platelets 261 11/02/2017: BUN 14; Creatinine, Ser 0.71; Potassium 4.0; Sodium 140   Lipid Panel    Component Value Date/Time   CHOL 240 (H) 11/01/2017 0201   TRIG 126 11/01/2017 0201   HDL 49 11/01/2017 0201   CHOLHDL 4.9 11/01/2017 0201   VLDL 25 11/01/2017 0201   LDLCALC 166 (H) 11/01/2017 0201    Additional studies/ records that were reviewed today include:   Cath: 11/01/17   Ost 1st Mrg lesion is 40% stenosed.  Prox LAD lesion is 95% stenosed.  Post intervention, there is a 0% residual stenosis.  A drug-eluting stent was successfully placed using a STENT RESOLUTE ONYX 4.5X12.  There is mild left ventricular systolic dysfunction.  LV end diastolic pressure is mildly elevated.  The left ventricular ejection fraction is 50-55% by visual estimate.  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 Mildly impaired LV function. EF 45-50% 3. Mildly elevated LVEDP 4. Successful PCI of the proximal LAD with DES x 1   Recommend uninterrupted dual antiplatelet therapy with Aspirin 81mg  daily and Ticagrelor 90mg  twice daily for a minimum of 12 months (ACS - Class I recommendation).  2D echo 11/01/17 Study Conclusions  - Left ventricle: The cavity size was normal. There was mild focal basal hypertrophy of the septum. Systolic function was mildly to moderately reduced. The estimated ejection fraction was in the range of 40% to 45%. Wall motion abnormalities noted below. The study is indeterminate for the evaluation of LV diastolic function. Acoustic contrast opacification revealed no evidence ofthrombus. - Regional wall motion abnormality: Akinesis of the apical  anterior and apical myocardium;  hypokinesis of the mid anterior, mid anteroseptal, apical septal, and apical lateral myocardium. - Aortic valve: There was no regurgitation. - Mitral valve: Structurally normal valve. No echocardiographic evidence for prolapse. Transvalvular velocity was within the normal range. There was no evidence for stenosis. There was no regurgitation. - Tricuspid valve: There was no significant regurgitation. - Pulmonic valve: There was no significant regurgitation.  Impressions:  - -LV EF mild to moderately reduced with focal wall motion abnormalities. Hypokinesis of anterior and anteroseptal walls and apex. Echo contrast used to better evaluate wall motion and exclude LV thrombus. -No evidence of mitral valve prolapse or regurgitation. -Technically difficult study. Right sided structures not well visualized but appear grossly normal.      ASSESSMENT:    1. Acute ST elevation myocardial infarction (STEMI) involving left anterior descending (LAD) coronary artery (HCC)   2. Mixed hyperlipidemia   3. Essential hypertension   4. Coronary artery disease involving native coronary artery of native heart without angina pectoris      PLAN:  In order of problems listed above:  1. CAD: Recently had anterior MI, underwent DES to LAD.  Continue aspirin, Brilinta and high-dose statin.  Emphasis has been placed on the compliance of dual antiplatelet therapy.  2. Hyperlipidemia: Continue Lipitor, fasting lipid panel and LFT in 6 weeks.  Recent LDL was 166  3. Hypertension: Blood pressure well controlled.  Continue lisinopril and Toprol-XL given the mild LV dysfunction that was noted on the recent echocardiogram  4. Asthma: I have given her some Xopenex inhaler to replace albuterol at home   Medication Adjustments/Labs and Tests Ordered: Current medicines are reviewed at length with the patient today.  Concerns regarding medicines are outlined above.  Medication changes,  Labs and Tests ordered today are listed in the Patient Instructions below. Patient Instructions  Medication Instructions:  Your physician recommends that you continue on your current medications as directed. Please refer to the Current Medication list given to you today.  Labwork: Your physician recommends that you return for lab work in: 6-8 wks FASTING LIPID, LFT  Testing/Procedures: None  Follow-Up: Your physician recommends that you schedule a follow-up appointment in: 2-19months with Dr Swaziland.  Any Other Special Instructions Will Be Listed Below (If Applicable). If you need a refill on your cardiac medications before your next appointment, please call your pharmacy.     Ramond Dial, [place]  11/09/2017 12:49 PM    [facility] [address] [place], [place], [place]  [address] Phone: [phone]; Fax: [phone]

## 2017-11-09 ENCOUNTER — Encounter: Payer: Self-pay | Admitting: Physician Assistant

## 2017-11-12 ENCOUNTER — Telehealth (HOSPITAL_COMMUNITY): Payer: Self-pay

## 2017-11-12 ENCOUNTER — Encounter (HOSPITAL_COMMUNITY): Payer: Self-pay

## 2017-11-12 NOTE — Telephone Encounter (Signed)
Attempted to call patient in regards to Cardiac Rehab - No voicemail  Mailed letter

## 2017-11-19 ENCOUNTER — Telehealth (HOSPITAL_COMMUNITY): Payer: Self-pay

## 2017-11-19 NOTE — Telephone Encounter (Signed)
Patient returned phone call in regards to Cardiac Rehab - Scheduled orientation on 01/01/18 at 7:30am. Patient will attend the 6:45am exc class. Mailed packet.

## 2017-11-21 ENCOUNTER — Telehealth: Payer: Self-pay | Admitting: Cardiology

## 2017-11-21 NOTE — Telephone Encounter (Signed)
Called patient and advised that it was okay to do injection. Spoke with Corine ShelterLuke Kilroy PA to clarify.   Patient verbalized understanding.

## 2017-11-21 NOTE — Telephone Encounter (Signed)
New Message         Patient would know if it's ok to do a steroid shot in left shoulder with Dr. Penni BombardKendall. Patient recv'd a stent on 11/01/2017 by Dr. SwazilandJordan.

## 2017-11-22 DIAGNOSIS — M7542 Impingement syndrome of left shoulder: Secondary | ICD-10-CM | POA: Diagnosis not present

## 2017-11-22 DIAGNOSIS — M25512 Pain in left shoulder: Secondary | ICD-10-CM | POA: Diagnosis not present

## 2017-11-27 DIAGNOSIS — B351 Tinea unguium: Secondary | ICD-10-CM | POA: Diagnosis not present

## 2017-11-27 DIAGNOSIS — I213 ST elevation (STEMI) myocardial infarction of unspecified site: Secondary | ICD-10-CM | POA: Diagnosis not present

## 2017-11-27 DIAGNOSIS — I10 Essential (primary) hypertension: Secondary | ICD-10-CM | POA: Diagnosis not present

## 2017-12-04 ENCOUNTER — Ambulatory Visit (INDEPENDENT_AMBULATORY_CARE_PROVIDER_SITE_OTHER): Payer: BLUE CROSS/BLUE SHIELD | Admitting: Family Medicine

## 2017-12-04 ENCOUNTER — Encounter: Payer: Self-pay | Admitting: Family Medicine

## 2017-12-04 VITALS — BP 105/73 | HR 80 | Temp 97.7°F | Resp 16 | Ht 67.75 in | Wt 318.0 lb

## 2017-12-04 DIAGNOSIS — E782 Mixed hyperlipidemia: Secondary | ICD-10-CM

## 2017-12-04 DIAGNOSIS — Z0000 Encounter for general adult medical examination without abnormal findings: Secondary | ICD-10-CM | POA: Diagnosis not present

## 2017-12-04 DIAGNOSIS — Z131 Encounter for screening for diabetes mellitus: Secondary | ICD-10-CM

## 2017-12-04 DIAGNOSIS — J4540 Moderate persistent asthma, uncomplicated: Secondary | ICD-10-CM | POA: Insufficient documentation

## 2017-12-04 DIAGNOSIS — Z1239 Encounter for other screening for malignant neoplasm of breast: Secondary | ICD-10-CM

## 2017-12-04 DIAGNOSIS — I2102 ST elevation (STEMI) myocardial infarction involving left anterior descending coronary artery: Secondary | ICD-10-CM | POA: Insufficient documentation

## 2017-12-04 DIAGNOSIS — Z1159 Encounter for screening for other viral diseases: Secondary | ICD-10-CM | POA: Diagnosis not present

## 2017-12-04 DIAGNOSIS — Z114 Encounter for screening for human immunodeficiency virus [HIV]: Secondary | ICD-10-CM

## 2017-12-04 DIAGNOSIS — Z0001 Encounter for general adult medical examination with abnormal findings: Secondary | ICD-10-CM | POA: Diagnosis not present

## 2017-12-04 DIAGNOSIS — I252 Old myocardial infarction: Secondary | ICD-10-CM | POA: Insufficient documentation

## 2017-12-04 DIAGNOSIS — Z23 Encounter for immunization: Secondary | ICD-10-CM | POA: Diagnosis not present

## 2017-12-04 DIAGNOSIS — E785 Hyperlipidemia, unspecified: Secondary | ICD-10-CM | POA: Insufficient documentation

## 2017-12-04 DIAGNOSIS — I10 Essential (primary) hypertension: Secondary | ICD-10-CM | POA: Diagnosis not present

## 2017-12-04 DIAGNOSIS — Z1231 Encounter for screening mammogram for malignant neoplasm of breast: Secondary | ICD-10-CM

## 2017-12-04 LAB — TSH: TSH: 2.11 u[IU]/mL (ref 0.35–4.50)

## 2017-12-04 LAB — HEMOGLOBIN A1C: HEMOGLOBIN A1C: 5.6 % (ref 4.6–6.5)

## 2017-12-04 MED ORDER — ZOSTER VAC RECOMB ADJUVANTED 50 MCG/0.5ML IM SUSR: 0.5000 mL | Freq: Once | INTRAMUSCULAR | 1 refills | Status: AC

## 2017-12-04 NOTE — Progress Notes (Signed)
Patient ID: [name], female  DOB: 1955-12-31, 62 y.o.   MRN: [id number] Patient Care Team    Relationship Specialty Notifications Start End  Alyssa Phillips, Alyssa Hardiman A, DO PCP - General Family Medicine  12/04/17   Alyssa Phillips, Alyssa M, Phillips PCP - Cardiology Cardiology  11/01/17   Alyssa Phillips, Alyssa Phillips (Inactive) Consulting Physician Gastroenterology  12/04/17   Alyssa Phillips, Frank, Phillips Consulting Physician Orthopedic Surgery  12/04/17     Chief Complaint  Patient presents with  . Establish Care    Subjective:  Alyssa Phillips is Phillips 62 y.o.  female present for new patient establishment. All past medical history, surgical history, allergies, family history, immunizations, medications and social history were updated in the electronic medical record today. All recent labs, ED visits and hospitalizations within the last year were reviewed.  Alyssa Phillips is Phillips [age] y.o. female present to establish care with preventive care/CPE desired today.   Recent STEMI s/p DES x1 LAD/HTN/HLD/morbid obesity:  Patient was admitted for anterior STEMI 11/01/2017 an dis under the care of cardiology Dr. SwazilandJordan.  She was found to have severe single-vessel disease but 95% proximal LAD treated with DES x1.  EF 45 to 50% by LV gram.  Echocardiogram showed EF 40 to 45%. Medications are managed by cardiology. She has had follow up s/p STEMI and has scheduled follow up in Phillips few weeks. She is scheduled to start cardio-rehab in 3-4 weeks. She is performing the recommended walking daily. She is due for lipid recheck in another 2-4 weeks.  Pt reports compliance with Brilinta, ASA 81, Lipitor, metoprolol, lisinopril. . Blood pressures ranges at home WNL. Patient denies chest pain, shortness of breath or lower extremity edema. She denies bleeding.  BMP: 11/02/2017 WNL CBC: 11/02/2017 WNL Diet: has changed to Phillips heart healthy diet.  Exercise: starting cardiology recommended activity s/p STEMI RF: HTN, HLD, HD, morbid obesity.    Cath:  11/01/17  Ost 1st Mrg lesion is 40% stenosed.  Prox LAD lesion is 95% stenosed.  Post intervention, there is Phillips 0% residual stenosis.  Phillips drug-eluting stent was successfully placed using Phillips STENT RESOLUTE ONYX 4.5X12.  There is mild left ventricular systolic dysfunction.  LV end diastolic pressure is mildly elevated.  The left ventricular ejection fraction is 50-55% by visual estimate. 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 Mildly impaired LV function. EF 45-50% 3. Mildly elevated LVEDP 4. Successful PCI of the proximal LAD with DES x 1 2D echo 11/01/17 Study Conclusions - Left ventricle: The cavity size was normal. There was mild focal basal hypertrophy of the septum. Systolic function was mildly to moderately reduced. The estimated ejection fraction was in the range of 40% to 45%. Wall motion abnormalities noted below. The study is indeterminate for the evaluation of LV diastolic function. Acoustic contrast opacification revealed no evidence ofthrombus. - Regional wall motion abnormality: Akinesis of the apical anterior and apical myocardium; hypokinesis of the mid anterior, mid anteroseptal, apical septal, and apical lateral myocardium. - Aortic valve: There was no regurgitation. - Mitral valve: Structurally normal valve. No echocardiographic evidence for prolapse. Transvalvular velocity was within the normal range. There was no evidence for stenosis. There was no regurgitation. - Tricuspid valve: There was no significant regurgitation. - Pulmonic valve: There was no significant regurgitation. Impressions: - -LV EF mild to moderately reduced with focal wall motion abnormalities. Hypokinesis of anterior and anteroseptal walls and apex. Echo contrast used to better evaluate wall motion and exclude LV thrombus. -  No evidence of mitral valve prolapse or regurgitation. -Technically difficult study. Right sided structures not  well visualized but appear grossly normal.  Health maintenance:  Colonoscopy: completed 06/08/2016 , by Dr. Madilyn Phillips, resutls normal. follow up 10 year. Mammogram: completed:05/02/2016, birads 1. Ordered today Cervical cancer screening: last pap: 03/15/2015, results: normal, completed by: 3 years Immunizations: tdap overdue, Influenza provided today (encouraged yearly), PNA series started today PSV23, shingles script provided today,  Infectious disease screening: HIV and Hep C collected today DEXA: await records to see if completed, if not will schedule with mammogram next year.  Assistive device: none Oxygen ZOX:WRUE Patient has [facility]. Hospitalizations/ED visits:reviewed extensive records in EMR  Depression screen [facility] 2/9 12/04/2017  Decreased Interest 0  Down, Depressed, Hopeless 0  PHQ - 2 Score 0   No flowsheet data found.    Fall Risk  12/04/2017  Falls in the past year? No    There is no immunization history on file for this patient.  No exam data present  Past Medical History:  Diagnosis Date  . Arthritis    Osteoarthrits-knees-hx. RTKA  . Asthma    environmental agents induced asthma  . Family history of adverse reaction to anesthesia    daughter very sensitive to anesthesia  . GERD (gastroesophageal reflux disease)   . Heart murmur    hx. mitral valve proplapse-mostly asymptomatic  . Hyperlipidemia   . Hypertension   . Mitral valve prolapse   . STEMI (ST elevation myocardial infarction) (HCC) 10/2017   LAD treated with DESx1  . Vitamin D deficiency    Allergies  Allergen Reactions  . Advil [Ibuprofen] Hives  . Aspirin     NEEDS TO EC ASPIRIN  . Compazine [Prochlorperazine Edisylate] Other (See Comments)    Eyes rolled back, tongue curled up   Past Surgical History:  Procedure Laterality Date  . CHOLECYSTECTOMY    . COLONOSCOPY WITH PROPOFOL N/Phillips 06/08/2016   Procedure: COLONOSCOPY WITH PROPOFOL;  Surgeon: Alyssa Cookey, Phillips;  Location: [facility] ENDOSCOPY;   Service: Endoscopy;  Laterality: N/Phillips;  . CORONARY STENT INTERVENTION N/Phillips 11/01/2017   Procedure: CORONARY STENT INTERVENTION;  Surgeon: Swaziland, Alyssa M, Phillips;  Location: [facility] INVASIVE CV LAB;  Service: Cardiovascular;  Laterality: N/Phillips;  Resolute Onyx 4.5 mm x12 mm  . CORONARY/GRAFT ACUTE MI REVASCULARIZATION N/Phillips 11/01/2017   Procedure: Coronary/Graft Acute MI Revascularization;  Surgeon: Swaziland, Alyssa M, Phillips;  Location: [facility] INVASIVE CV LAB;  Service: Cardiovascular;  Laterality: N/Phillips;  . KNEE ARTHROSCOPY  05-02-12   x2 right/ x1 left  . LEFT HEART CATH AND CORONARY ANGIOGRAPHY N/Phillips 11/01/2017   Procedure: LEFT HEART CATH AND CORONARY ANGIOGRAPHY;  Surgeon: Swaziland, Alyssa M, Phillips;  Location: [facility] INVASIVE CV LAB;  Service: Cardiovascular;  Laterality: N/Phillips;  . REPLACEMENT TOTAL KNEE Right 10/2010  . TOTAL KNEE ARTHROPLASTY  05/10/2012   Procedure: TOTAL KNEE ARTHROPLASTY;  Surgeon: Loanne Drilling, Phillips;  Location: [facility] ORS;  Service: Orthopedics;  Laterality: Left;  . TUBAL LIGATION    . UMBILICAL HERNIA REPAIR     Family History  Problem Relation Age of Onset  . Hypertension Mother   . Arthritis Mother   . Lymphoma Mother   . Asthma Mother   . Cancer Mother   . Hearing loss Mother   . Miscarriages / [place] Mother   . COPD Father   . Alcohol abuse Father   . Arthritis Father   . Hearing loss Father   . Hypertension Father   . Miscarriages /  Stillbirths Sister   . Arthritis Maternal Grandmother   . Hearing loss Maternal Grandmother   . Arthritis Maternal Grandfather   . Diabetes Maternal Grandfather   . Hypertension Maternal Grandfather   . Stroke Maternal Grandfather   . Arthritis Paternal Grandmother   . Diabetes Paternal Grandmother   . Hearing loss Paternal Grandmother   . Hearing loss Paternal Grandfather   . Learning disabilities Paternal Grandfather    Social History   Socioeconomic History  . Marital status: Married    Spouse name: Not on file  . Number of children: Not on file    . Years of education: Not on file  . Highest education level: Not on file  Occupational History  . Not on file  Social Needs  . Financial resource strain: Not on file  . Food insecurity:    Worry: Not on file    Inability: Not on file  . Transportation needs:    Medical: Not on file    Non-medical: Not on file  Tobacco Use  . Smoking status: Never Smoker  . Smokeless tobacco: Never Used  Substance and Sexual Activity  . Alcohol use: Yes    Comment: occ. rare  . Drug use: No  . Sexual activity: Yes    Partners: Male  Lifestyle  . Physical activity:    Days per week: Not on file    Minutes per session: Not on file  . Stress: Not on file  Relationships  . Social connections:    Talks on phone: Not on file    Gets together: Not on file    Attends religious service: Not on file    Active member of club or organization: Not on file    Attends meetings of clubs or organizations: Not on file    Relationship status: Not on file  . Intimate partner violence:    Fear of current or ex partner: Not on file    Emotionally abused: Not on file    Physically abused: Not on file    Forced sexual activity: Not on file  Other Topics Concern  . Not on file  Social History Narrative  . Not on file   Allergies as of 12/04/2017      Reactions   Advil [ibuprofen] Hives   Aspirin    NEEDS TO EC ASPIRIN   Compazine [prochlorperazine Edisylate] Other (See Comments)   Eyes rolled back, tongue curled up      Medication List        Accurate as of 12/04/17 11:31 AM. Always use your most recent med list.          acetaminophen 325 MG tablet Commonly known as:  TYLENOL Take 325 mg by mouth every 6 (six) hours as needed.   albuterol 108 (90 Base) MCG/ACT inhaler Commonly known as:  PROVENTIL HFA;VENTOLIN HFA Inhale 2 puffs into the lungs every 6 (six) hours as needed for wheezing or shortness of breath.   aspirin 81 MG EC tablet Take 1 tablet (81 mg total) by mouth daily.    atorvastatin 80 MG tablet Commonly known as:  LIPITOR Take 1 tablet (80 mg total) by mouth daily at 6 PM.   benzonatate 100 MG capsule Commonly known as:  TESSALON benzonatate 100 mg capsule   BEPREVE 1.5 % Soln Generic drug:  Bepotastine Besilate Place 1 drop into both eyes daily as needed. For itchy eyes   budesonide-formoterol 160-4.5 MCG/ACT inhaler Commonly known as:  SYMBICORT Inhale 2 puffs into  the lungs 2 (two) times daily.   cholecalciferol 1000 units tablet Commonly known as:  VITAMIN D Take 1,000 Units by mouth 2 (two) times daily.   diclofenac sodium 1 % Gel Commonly known as:  VOLTAREN diclofenac 1 % topical gel   levalbuterol 0.63 MG/3ML nebulizer solution Commonly known as:  XOPENEX Take 0.63 mg by nebulization every 4 (four) hours as needed for wheezing or shortness of breath.   levalbuterol 45 MCG/ACT inhaler Commonly known as:  XOPENEX HFA Inhale 2 puffs into the lungs every 4 (four) hours as needed for wheezing.   levocetirizine 5 MG tablet Commonly known as:  XYZAL Take 5 mg by mouth every evening.   lisinopril 5 MG tablet Commonly known as:  PRINIVIL,ZESTRIL Take 1 tablet (5 mg total) by mouth daily.   metoprolol succinate 50 MG 24 hr tablet Commonly known as:  TOPROL-XL Take 1 tablet (50 mg total) by mouth daily. Take with or immediately following Phillips meal.   mometasone 50 MCG/ACT nasal spray Commonly known as:  NASONEX Place 2 sprays into the nose every morning.   montelukast 10 MG tablet Commonly known as:  SINGULAIR Take 10 mg by mouth at bedtime.   multivitamin tablet Take 1 tablet by mouth daily.   nitroGLYCERIN 0.4 MG SL tablet Commonly known as:  NITROSTAT Place 1 tablet (0.4 mg total) under the tongue every 5 (five) minutes as needed for chest pain.   ticagrelor 90 MG Tabs tablet Commonly known as:  BRILINTA Take 1 tablet (90 mg total) by mouth 2 (two) times daily.   Zoster Vaccine Adjuvanted injection Commonly known as:   SHINGRIX Inject 0.5 mLs into the muscle once for 1 dose. Repeat dose in 2-6 months       All past medical history, surgical history, allergies, family history, immunizations andmedications were updated in the EMR today and reviewed under the history and medication portions of their EMR.     No results found.   ROS: 14 pt review of systems performed and negative (unless mentioned in an HPI)  Objective: BP 105/73 (BP Location: Left Arm, Patient Position: Sitting, Cuff Size: Large)   Pulse 80   Temp 97.7 F (36.5 C) (Oral)   Resp 16   Ht 5' 7.75" (1.721 m)   Wt (!) 318 lb (144.2 kg)   SpO2 95%   BMI 48.71 kg/m  Gen: Afebrile. No acute distress. Nontoxic in appearance, well-developed, well-nourished,  Morbidly obese pleasant caucasian female.  HENT: AT. [place]. Bilateral TM visualized and normal in appearance, normal external auditory canal. MMM, no oral lesions, adequate dentition. Bilateral nares within normal limits. Throat without erythema, ulcerations or exudates. no Cough on exam, no hoarseness on exam. Eyes:Pupils Equal Round Reactive to light, Extraocular movements intact,  Conjunctiva without redness, discharge or icterus. Neck/lymp/endocrine: Supple,no lymphadenopathy, no thyromegaly CV: RRR no murmur appreciated today, no edema, +2/4 P posterior tibialis pulses. no carotid bruits. No JVD. Chest: CTAB, no wheeze, rhonchi or crackles. normal Respiratory effort. good Air movement. Abd: Soft. obese. NTND. BS present. no Masses palpated. No hepatosplenomegaly. No rebound tenderness or guarding. Skin: no rashes, purpura or petechiae. Warm and well-perfused. Skin intact. Neuro/Msk:  Normal gait. PERLA. EOMi. Alert. Oriented x3.  Cranial nerves II through XII intact. Muscle strength 5/5 upper/lower extremity. DTRs equal bilaterally. Psych: Normal affect, dress and demeanor. Normal speech. Normal thought content and judgment.  Assessment/plan: Alyssa Phillips is Phillips 62 y.o. female  present for establishment of care. Encounter for hepatitis C  screening test for low risk patient - Hepatitis C Antibody Encounter for screening for HIV - HIV antibody (with reflex) Screening for diabetes mellitus - HgB A1c STEMI involving left anterior descending coronary artery (HCC)/ Mixed hyperlipidemia/Essential hypertension/Morbid obesity (HCC) - stable. Follows w/ cardiology.  TSH collected today Deferred lipid collection and rpt CMP to cardiology- too early today and she has scheduled follow up with them.  Starting card/pulrehab in Phillips few weeks.  Immunization due - Flu Vaccine QUAD 6+ mos PF IM (Fluarix Quad PF) - Pneumococcal polysaccharide vaccine 23-valent greater than or equal to 2yo subcutaneous/IM Moderate persistent asthma without complication - Controlled. Established with pulmonology. Medications provided by them.  Encounter for general adult medical examination with abnormal findings Patient was encouraged to exercise greater than 150 minutes Phillips week. Patient was encouraged to choose Phillips diet filled with fresh fruits and vegetables, and lean meats. AVS provided to patient today for education/recommendation on gender specific health and safety maintenance. Colonoscopy: Due 2028 Mammogram:  Ordered today Cervical cancer screening: last pap: Due after 02/2017--> completed with CPE next year if desired. Immunizations: tdap overdue- make nurse visit or will update next CPE, Influenza provided today (encouraged yearly), PNA series started today PSV23, shingles script provided today Infectious disease screening: HIV and Hep C collected today DEXA: await records to see if completed, if not will schedule with mammogram next year.   Return in about 1 year (around 12/05/2018) for CPE w/ PAP.  Note is dictated utilizing voice recognition software. Although note has been proof read prior to signing, occasional typographical errors still can be missed. If any questions arise, please do not  hesitate to call for verification.  Electronically signed by: Felix Pacini, DO [facility] [address]

## 2017-12-04 NOTE — Patient Instructions (Addendum)
Flu shot and first pneumonia vaccine given today.  Shingrix series for shingles prevention printed for you to take to your pharmacy.  I ordered your mammogram, they will call you to schedule.  You can schedule your tdap (tetanus by nurse visit if desired before physical next year--> otherwise we will do it next year)  Follow up up here yearly for physicals (next year schedule with pap) and sooner if needed for acute/ill visits.   If desire [place] to take over any of your medications, we will need to see you for those conditions in order to take over script.  You should keep all cardiology medications with cardiology with routine follow ups.    Health Maintenance, Female Adopting a healthy lifestyle and getting preventive care can go a long way to promote health and wellness. Talk with your health care provider about what schedule of regular examinations is right for you. This is a good chance for you to check in with your provider about disease prevention and staying healthy. In between checkups, there are plenty of things you can do on your own. Experts have done a lot of research about which lifestyle changes and preventive measures are most likely to keep you healthy. Ask your health care provider for more information. Weight and diet Eat a healthy diet  Be sure to include plenty of vegetables, fruits, low-fat dairy products, and lean protein.  Do not eat a lot of foods high in solid fats, added sugars, or salt.  Get regular exercise. This is one of the most important things you can do for your health. ? Most adults should exercise for at least 150 minutes each week. The exercise should increase your heart rate and make you sweat (moderate-intensity exercise). ? Most adults should also do strengthening exercises at least twice a week. This is in addition to the moderate-intensity exercise.  Maintain a healthy weight  Body mass index (BMI) is a measurement that can be used to identify  possible weight problems. It estimates body fat based on height and weight. Your health care provider can help determine your BMI and help you achieve or maintain a healthy weight.  For females 24 years of age and older: ? A BMI below 18.5 is considered underweight. ? A BMI of 18.5 to 24.9 is normal. ? A BMI of 25 to 29.9 is considered overweight. ? A BMI of 30 and above is considered obese.  Watch levels of cholesterol and blood lipids  You should start having your blood tested for lipids and cholesterol at 63 years of age, then have this test every 5 years.  You may need to have your cholesterol levels checked more often if: ? Your lipid or cholesterol levels are high. ? You are older than 63 years of age. ? You are at high risk for heart disease.  Cancer screening Lung Cancer  Lung cancer screening is recommended for adults 24-76 years old who are at high risk for lung cancer because of a history of smoking.  A yearly low-dose CT scan of the lungs is recommended for people who: ? Currently smoke. ? Have quit within the past 15 years. ? Have at least a 30-pack-year history of smoking. A pack year is smoking an average of one pack of cigarettes a day for 1 year.  Yearly screening should continue until it has been 15 years since you quit.  Yearly screening should stop if you develop a health problem that would prevent you from having lung  cancer treatment.  Breast Cancer  Practice breast self-awareness. This means understanding how your breasts normally appear and feel.  It also means doing regular breast self-exams. Let your health care provider know about any changes, no matter how small.  If you are in your 20s or 30s, you should have a clinical breast exam (CBE) by a health care provider every 1-3 years as part of a regular health exam.  If you are 66 or older, have a CBE every year. Also consider having a breast X-ray (mammogram) every year.  If you have a family history  of breast cancer, talk to your health care provider about genetic screening.  If you are at high risk for breast cancer, talk to your health care provider about having an MRI and a mammogram every year.  Breast cancer gene (BRCA) assessment is recommended for women who have family members with BRCA-related cancers. BRCA-related cancers include: ? Breast. ? Ovarian. ? Tubal. ? Peritoneal cancers.  Results of the assessment will determine the need for genetic counseling and BRCA1 and BRCA2 testing.  Cervical Cancer Your health care provider may recommend that you be screened regularly for cancer of the pelvic organs (ovaries, uterus, and vagina). This screening involves a pelvic examination, including checking for microscopic changes to the surface of your cervix (Pap test). You may be encouraged to have this screening done every 3 years, beginning at age 18.  For women ages 46-65, health care providers may recommend pelvic exams and Pap testing every 3 years, or they may recommend the Pap and pelvic exam, combined with testing for human papilloma virus (HPV), every 5 years. Some types of HPV increase your risk of cervical cancer. Testing for HPV may also be done on women of any age with unclear Pap test results.  Other health care providers may not recommend any screening for nonpregnant women who are considered low risk for pelvic cancer and who do not have symptoms. Ask your health care provider if a screening pelvic exam is right for you.  If you have had past treatment for cervical cancer or a condition that could lead to cancer, you need Pap tests and screening for cancer for at least 20 years after your treatment. If Pap tests have been discontinued, your risk factors (such as having a new sexual partner) need to be reassessed to determine if screening should resume. Some women have medical problems that increase the chance of getting cervical cancer. In these cases, your health care provider  may recommend more frequent screening and Pap tests.  Colorectal Cancer  This type of cancer can be detected and often prevented.  Routine colorectal cancer screening usually begins at 63 years of age and continues through 63 years of age.  Your health care provider may recommend screening at an earlier age if you have risk factors for colon cancer.  Your health care provider may also recommend using home test kits to check for hidden blood in the stool.  A small camera at the end of a tube can be used to examine your colon directly (sigmoidoscopy or colonoscopy). This is done to check for the earliest forms of colorectal cancer.  Routine screening usually begins at age 62.  Direct examination of the colon should be repeated every 5-10 years through 63 years of age. However, you may need to be screened more often if early forms of precancerous polyps or small growths are found.  Skin Cancer  Check your skin from head to toe  regularly.  Tell your health care provider about any new moles or changes in moles, especially if there is a change in a mole's shape or color.  Also tell your health care provider if you have a mole that is larger than the size of a pencil eraser.  Always use sunscreen. Apply sunscreen liberally and repeatedly throughout the day.  Protect yourself by wearing long sleeves, pants, a wide-brimmed hat, and sunglasses whenever you are outside.  Heart disease, diabetes, and high blood pressure  High blood pressure causes heart disease and increases the risk of stroke. High blood pressure is more likely to develop in: ? People who have blood pressure in the high end of the normal range (130-139/85-89 mm Hg). ? People who are overweight or obese. ? People who are African American.  If you are 57-22 years of age, have your blood pressure checked every 3-5 years. If you are 43 years of age or older, have your blood pressure checked every year. You should have your  blood pressure measured twice-once when you are at a hospital or clinic, and once when you are not at a hospital or clinic. Record the average of the two measurements. To check your blood pressure when you are not at a hospital or clinic, you can use: ? An automated blood pressure machine at a pharmacy. ? A home blood pressure monitor.  If you are between 11 years and 17 years old, ask your health care provider if you should take aspirin to prevent strokes.  Have regular diabetes screenings. This involves taking a blood sample to check your fasting blood sugar level. ? If you are at a normal weight and have a low risk for diabetes, have this test once every three years after 63 years of age. ? If you are overweight and have a high risk for diabetes, consider being tested at a younger age or more often. Preventing infection Hepatitis B  If you have a higher risk for hepatitis B, you should be screened for this virus. You are considered at high risk for hepatitis B if: ? You were born in a country where hepatitis B is common. Ask your health care provider which countries are considered high risk. ? Your parents were born in a high-risk country, and you have not been immunized against hepatitis B (hepatitis B vaccine). ? You have HIV or AIDS. ? You use needles to inject street drugs. ? You live with someone who has hepatitis B. ? You have had sex with someone who has hepatitis B. ? You get hemodialysis treatment. ? You take certain medicines for conditions, including cancer, organ transplantation, and autoimmune conditions.  Hepatitis C  Blood testing is recommended for: ? Everyone born from [birth year] through 1966. ? Anyone with known risk factors for hepatitis C.  Sexually transmitted infections (STIs)  You should be screened for sexually transmitted infections (STIs) including gonorrhea and chlamydia if: ? You are sexually active and are younger than 63 years of age. ? You are older than 63  years of age and your health care provider tells you that you are at risk for this type of infection. ? Your sexual activity has changed since you were last screened and you are at an increased risk for chlamydia or gonorrhea. Ask your health care provider if you are at risk.  If you do not have HIV, but are at risk, it may be recommended that you take a prescription medicine daily to prevent HIV infection. This  is called pre-exposure prophylaxis (PrEP). You are considered at risk if: ? You are sexually active and do not regularly use condoms or know the HIV status of your partner(s). ? You take drugs by injection. ? You are sexually active with a partner who has HIV.  Talk with your health care provider about whether you are at high risk of being infected with HIV. If you choose to begin PrEP, you should first be tested for HIV. You should then be tested every 3 months for as long as you are taking PrEP. Pregnancy  If you are premenopausal and you may become pregnant, ask your health care provider about preconception counseling.  If you may become pregnant, take 400 to 800 micrograms (mcg) of folic acid every day.  If you want to prevent pregnancy, talk to your health care provider about birth control (contraception). Osteoporosis and menopause  Osteoporosis is a disease in which the bones lose minerals and strength with aging. This can result in serious bone fractures. Your risk for osteoporosis can be identified using a bone density scan.  If you are 66 years of age or older, or if you are at risk for osteoporosis and fractures, ask your health care provider if you should be screened.  Ask your health care provider whether you should take a calcium or vitamin D supplement to lower your risk for osteoporosis.  Menopause may have certain physical symptoms and risks.  Hormone replacement therapy may reduce some of these symptoms and risks. Talk to your health care provider about whether  hormone replacement therapy is right for you. Follow these instructions at home:  Schedule regular health, dental, and eye exams.  Stay current with your immunizations.  Do not use any tobacco products including cigarettes, chewing tobacco, or electronic cigarettes.  If you are pregnant, do not drink alcohol.  If you are breastfeeding, limit how much and how often you drink alcohol.  Limit alcohol intake to no more than 1 drink per day for nonpregnant women. One drink equals 12 ounces of beer, 5 ounces of wine, or 1 ounces of hard liquor.  Do not use street drugs.  Do not share needles.  Ask your health care provider for help if you need support or information about quitting drugs.  Tell your health care provider if you often feel depressed.  Tell your health care provider if you have ever been abused or do not feel safe at home. This information is not intended to replace advice given to you by your health care provider. Make sure you discuss any questions you have with your health care provider. Document Released: 10/17/2010 Document Revised: 09/09/2015 Document Reviewed: 01/05/2015 Elsevier Interactive Patient Education  2018 [employer].   Please help [place] help you:  We are honored you have chosen [facility] for your Primary Care home. Below you will find basic instructions that you may need to access in the future. Please help [place] help you by reading the instructions, which cover many of the frequent questions we experience.   Prescription refills and request:  -In order to allow more efficient response time, please call your pharmacy for all refills. They will forward the request electronically to [place]. This allows for the quickest possible response. Request left on a nurse line can take longer to refill, since these are checked as time allows between office patients and other phone calls.  - refill request can take up to 3-5 working days to complete.  - If request  is  sent electronically and request is appropiate, it is usually completed in 1-2 business days.  - all patients will need to be seen routinely for all chronic medical conditions requiring prescription medications (see follow-up below). If you are overdue for follow up on your condition, you will be asked to make an appointment and we will call in enough medication to cover you until your appointment (up to 30 days).  - all controlled substances will require a face to face visit to request/refill.  - if you desire your prescriptions to go through a new pharmacy, and have an active script at original pharmacy, you will need to call your pharmacy and have scripts transferred to new pharmacy. This is completed between the pharmacy locations and not by your provider.    Results: If any images or labs were ordered, it can take up to 1 week to get results depending on the test ordered and the lab/facility running and resulting the test. - Normal or stable results, which do not need further discussion, may be released to your mychart immediately with attached note to you. A call may not be generated for normal results. Please make certain to sign up for mychart. If you have questions on how to activate your mychart you can call the front office.  - If your results need further discussion, our office will attempt to contact you via phone, and if unable to reach you after 2 attempts, we will release your abnormal result to your mychart with instructions.  - All results will be automatically released in mychart after 1 week.  - Your provider will provide you with explanation and instruction on all relevant material in your results. Please keep in mind, results and labs may appear confusing or abnormal to the untrained eye, but it does not mean they are actually abnormal for you personally. If you have any questions about your results that are not covered, or you desire more detailed explanation than what was provided,  you should make an appointment with your provider to do so.   Our office handles many outgoing and incoming calls daily. If we have not contacted you within 1 week about your results, please check your mychart to see if there is a message first and if not, then contact our office.  In helping with this matter, you help decrease call volume, and therefore allow [place] to be able to respond to patients needs more efficiently.   Acute office visits (sick visit):  An acute visit is intended for a new problem and are scheduled in shorter time slots to allow schedule openings for patients with new problems. This is the appropriate visit to discuss a new problem. Problems will not be addressed by phone call or Echart message. Appointment is needed if requesting treatment. In order to provide you with excellent quality medical care with proper time for you to explain your problem, have an exam and receive treatment with instructions, these appointments should be limited to one new problem per visit. If you experience a new problem, in which you desire to be addressed, please make an acute office visit, we save openings on the schedule to accommodate you. Please do not save your new problem for any other type of visit, let us take care of it properly and quickly for you.   Follow up visits:  Depending on your condition(s) your provider will need to see you routinely in order to provide you with quality care and prescribe medication(s). Most  chronic conditions (Example: hypertension, Diabetes, depression/anxiety... etc), require visits a couple times a year. Your provider will instruct you on proper follow up for your personal medical conditions and history. Please make certain to make follow up appointments for your condition as instructed. Failing to do so could result in lapse in your medication treatment/refills. If you request a refill, and are overdue to be seen on a condition, we will always provide you with a 30  day script (once) to allow you time to schedule.    Medicare wellness (well visit): - we have a wonderful Nurse Maudie Mercury), that will meet with you and provide you will yearly medicare wellness visits. These visits should occur yearly (can not be scheduled less than 1 calendar year apart) and cover preventive health, immunizations, advance directives and screenings you are entitled to yearly through your medicare benefits. Do not miss out on your entitled benefits, this is when medicare will pay for these benefits to be ordered for you.  These are strongly encouraged by your provider and is the appropriate type of visit to make certain you are up to date with all preventive health benefits. If you have not had your medicare wellness exam in the last 12 months, please make certain to schedule one by calling the office and schedule your medicare wellness with Maudie Mercury as soon as possible.   Yearly physical (well visit):  - Adults are recommended to be seen yearly for physicals. Check with your insurance and date of your last physical, most insurances require one calendar year between physicals. Physicals include all preventive health topics, screenings, medical exam and labs that are appropriate for gender/age and history. You may have fasting labs needed at this visit. This is a well visit (not a sick visit), new problems should not be covered during this visit (see acute visit).  - Pediatric patients are seen more frequently when they are younger. Your provider will advise you on well child visit timing that is appropriate for your their age. - This is not a medicare wellness visit. Medicare wellness exams do not have an exam portion to the visit. Some medicare companies allow for a physical, some do not allow a yearly physical. If your medicare allows a yearly physical you can schedule the medicare wellness with our nurse Maudie Mercury and have your physical with your provider after, on the same day. Please check with  insurance for your full benefits.   Late Policy/No Shows:  - all new patients should arrive 15-30 minutes earlier than appointment to allow [place] time  to  obtain all personal demographics,  insurance information and for you to complete office paperwork. - All established patients should arrive 10-15 minutes earlier than appointment time to update all information and be checked in .  - In our best efforts to run on time, if you are late for your appointment you will be asked to either reschedule or if able, we will work you back into the schedule. There will be a wait time to work you back in the schedule,  depending on availability.  - If you are unable to make it to your appointment as scheduled, please call 24 hours ahead of time to allow [place] to fill the time slot with someone else who needs to be seen. If you do not cancel your appointment ahead of time, you may be charged a no show fee.

## 2017-12-05 ENCOUNTER — Other Ambulatory Visit: Payer: Self-pay | Admitting: Family Medicine

## 2017-12-05 ENCOUNTER — Encounter: Payer: Self-pay | Admitting: *Deleted

## 2017-12-05 ENCOUNTER — Encounter: Payer: Self-pay | Admitting: Family Medicine

## 2017-12-05 DIAGNOSIS — Z1231 Encounter for screening mammogram for malignant neoplasm of breast: Secondary | ICD-10-CM

## 2017-12-05 LAB — HEPATITIS C ANTIBODY
Hepatitis C Ab: NONREACTIVE
SIGNAL TO CUT-OFF: 0.02 (ref <1.00)

## 2017-12-05 LAB — HIV ANTIBODY (ROUTINE TESTING W REFLEX): HIV: NONREACTIVE

## 2017-12-06 ENCOUNTER — Encounter: Payer: Self-pay | Admitting: Family Medicine

## 2017-12-10 ENCOUNTER — Telehealth: Payer: Self-pay | Admitting: Cardiology

## 2017-12-10 NOTE — Telephone Encounter (Signed)
New Message: ° ° ° °Pt is requesting a call back  °

## 2017-12-10 NOTE — Telephone Encounter (Signed)
Patient would like to be cleared to at least drive a regular vehicle, did not see anything written in last note, she has appointment with Dr.Jordan in October but wanted to know if she could drive in the meantime since that appointment is so far out.   Please advise.  Thank you!

## 2017-12-10 NOTE — Telephone Encounter (Signed)
She can resume driving now  Peter SwazilandJordan MD, [id number]

## 2017-12-10 NOTE — Telephone Encounter (Signed)
Patient called and notified.  No questions or concerns at this time.

## 2017-12-20 ENCOUNTER — Ambulatory Visit: Payer: Self-pay | Admitting: *Deleted

## 2017-12-20 NOTE — Telephone Encounter (Signed)
Pt states on yesterday around 3 pm while at work she began to feel queasy and lightheaded. Pt states when she moved her head up and down she felt like her eyesight became dark, like a bullseye and felt like she was going to faint. Pt states she did not feel like the room was spinining and would not describe the feeling as dizziness. Pt states she was picked up from work by her husband and had a difficult time getting to the garage to the couch one she was home. Pt states she also experienced some neck stiffness on yesterday but it is not present today. Pt states when she woke up this morning she felt fine and has not been lightheaded . Pt states she is experiencing some sensitivity in her left  jaw near the ear, the area under the ear where the ear meets the jaw. Pt denies any chest pain, SOB, dizziness or other symptoms at this time. Pt currently at work and states she does not get off until 6pm and will not be able to come in for an appt today. Pt scheduled for appt on tomorrow 12/21/17 and advised that if symptoms become worse or if she felt like she was going to faint again to seek treatment in the ED. Pt verbalized understanding.  Reason for Disposition . [1] MODERATE dizziness (e.g., interferes with normal activities) AND [2] has NOT been evaluated by physician for this  (Exception: dizziness caused by heat exposure, sudden standing, or poor fluid intake)  Answer Assessment - Initial Assessment Questions 1. DESCRIPTION: "Describe your dizziness."     Felt queasy on the stomach yesterday around 3pm and states that when she moved her head up and down she felt like she was going to faint and eyesight became dark like a bullseye. Pt states she was at work when this occurred and had her husband pick her up to take her home.Pt states she would not describe the feeling as dizziness or like the room was spinning. Pt states she felt this way until she went to bed. 2. LIGHTHEADED: "Do you feel lightheaded?" (e.g.,  somewhat faint, woozy, weak upon standing)     Yes on yesterday but not today. 3. VERTIGO: "Do you feel like either you or the room is spinning or tilting?" (i.e. vertigo)     No 4. SEVERITY: "How bad is it?"  "Do you feel like you are going to faint?" "Can you stand and walk?"   - MILD - walking normally   - MODERATE - interferes with normal activities (e.g., work, school)    - SEVERE - unable to stand, requires support to walk, feels like passing out now.      No dizziness at this time, but yesterday pt states she felt like she was going to faint 5. ONSET:  "When did the dizziness begin?"    Yesterday around 3pm 6. AGGRAVATING FACTORS: "Does anything make it worse?" (e.g., standing, change in head position)     Moving head up and down on yesterday 7. HEART RATE: "Can you tell me your heart rate?" "How many beats in 15 seconds?"  (Note: not all patients can do this)       No, but pt states when she checked her BP yesterday it was 115/70, pt is currently at work and unable to check BP at this time 8. CAUSE: "What do you think is causing the dizziness?"     Unknown 9. RECURRENT SYMPTOM: "Have you had dizziness before?" If so,  ask: "When was the last time?" "What happened that time?"     No 10. OTHER SYMPTOMS: "Do you have any other symptoms?" (e.g., fever, chest pain, vomiting, diarrhea, bleeding)       Always have loose stools-maybe due to a change in diet, really loose stool on yesterday after drinking lemonade from [employer] 11. PREGNANCY: "Is there any chance you are pregnant?" "When was your last menstrual period?"       n/a  Protocols used: DIZZINESS [facility]

## 2017-12-21 ENCOUNTER — Ambulatory Visit: Payer: BLUE CROSS/BLUE SHIELD | Admitting: Family Medicine

## 2017-12-21 ENCOUNTER — Encounter: Payer: Self-pay | Admitting: Family Medicine

## 2017-12-21 VITALS — BP 113/69 | HR 73 | Temp 98.1°F | Resp 20 | Ht 67.75 in | Wt 318.6 lb

## 2017-12-21 DIAGNOSIS — R42 Dizziness and giddiness: Secondary | ICD-10-CM

## 2017-12-21 DIAGNOSIS — R110 Nausea: Secondary | ICD-10-CM

## 2017-12-21 NOTE — Patient Instructions (Addendum)
No identifiable causes to your lightlessness.  Treat as possible elevated sugar. Try to avoid high sugar drinks/meals.  If occurs again, take your blood glucose and blood pressure.  Stay hydrated.    Your exam is normal today. Hopefully this was isolated.   Dizziness Dizziness is a common problem. It makes you feel unsteady or light-headed. You may feel like you are about to pass out (faint). Dizziness can lead to getting hurt if you stumble or fall. Dizziness can be caused by many things, including:  Medicines.  Not having enough water in your body (dehydration).  Illness.  Follow these instructions at home: Eating and drinking  Drink enough fluid to keep your pee (urine) clear or pale yellow. This helps to keep you from getting dehydrated. Try to drink more clear fluids, such as water.  Do not drink alcohol.  Limit how much caffeine you drink or eat, if your doctor tells you to do that.  Limit how much salt (sodium) you drink or eat, if your doctor tells you to do that. Activity  Avoid making quick movements. ? When you stand up from sitting in a chair, steady yourself until you feel okay. ? In the morning, first sit up on the side of the bed. When you feel okay, stand slowly while you hold onto something. Do this until you know that your balance is fine.  If you need to stand in one place for a long time, move your legs often. Tighten and relax the muscles in your legs while you are standing.  Do not drive or use heavy machinery if you feel dizzy.  Avoid bending down if you feel dizzy. Place items in your home so you can reach them easily without leaning over. Lifestyle  Do not use any products that contain nicotine or tobacco, such as cigarettes and e-cigarettes. If you need help quitting, ask your doctor.  Try to lower your stress level. You can do this by using methods such as yoga or meditation. Talk with your doctor if you need help. General instructions  Watch  your dizziness for any changes.  Take over-the-counter and prescription medicines only as told by your doctor. Talk with your doctor if you think that you are dizzy because of a medicine that you are taking.  Tell a friend or a family member that you are feeling dizzy. If he or she notices any changes in your behavior, have this person call your doctor.  Keep all follow-up visits as told by your doctor. This is important. Contact a doctor if:  Your dizziness does not go away.  Your dizziness or light-headedness gets worse.  You feel sick to your stomach (nauseous).  You have trouble hearing.  You have new symptoms.  You are unsteady on your feet.  You feel like the room is spinning. Get help right away if:  You throw up (vomit) or have watery poop (diarrhea), and you cannot eat or drink anything.  You have trouble: ? Talking. ? Walking. ? Swallowing. ? Using your arms, hands, or legs.  You feel generally weak.  You are not thinking clearly, or you have trouble forming sentences. A friend or family member may notice this.  You have: ? Chest pain. ? Pain in your belly (abdomen). ? Shortness of breath. ? Sweating.  Your vision changes.  You are bleeding.  You have a very bad headache.  You have neck pain or a stiff neck.  You have a fever. These symptoms may  be an emergency. Do not wait to see if the symptoms will go away. Get medical help right away. Call your local emergency services (911 in the [place] Do not drive yourself to the hospital. Summary  Dizziness makes you feel unsteady or light-headed. You may feel like you are about to pass out (faint).  Drink enough fluid to keep your pee (urine) clear or pale yellow. Do not drink alcohol.  Avoid making quick movements if you feel dizzy.  Watch your dizziness for any changes. This information is not intended to replace advice given to you by your health care provider. Make sure you discuss any questions you  have with your health care provider. Document Released: 03/23/2011 Document Revised: 04/20/2016 Document Reviewed: 04/20/2016 Elsevier Interactive Patient Education  2017 [employer].

## 2017-12-21 NOTE — Progress Notes (Signed)
Alyssa Phillips , 1956-04-03, 62 y.o., female MRN: [id number] Patient Care Team    Relationship Specialty Notifications Start End  Natalia Leatherwood, DO PCP - General Family Medicine  12/04/17   [place], Peter M, MD PCP - Cardiology Cardiology  11/01/17   Dorena Cookey, MD (Inactive) Consulting Physician Gastroenterology  12/04/17   Ollen Gross, MD Consulting Physician Orthopedic Surgery  12/04/17     Chief Complaint  Patient presents with  . Nausea    felt like she was going to faint seemed to be triggered by movement of her head     Subjective: Pt presents for an OV with complaints of lightheadedness of 1 day  duration.  Associated symptoms include mild nausea, worsening symptoms with head movement. She reports the symptoms last from about 3pm until she fell asleep about 8 pm. She reports she had eaten the day of occurrence. She denies recent illness. Her BP was normal during event. She has endorsed drinking a sugary lemonade close to time of event. She is not a diabetic. Patient denies chest pain, shortness of breath, dizziness or lower extremity edema.   Depression screen PHQ 2/9 12/04/2017  Decreased Interest 0  Down, Depressed, Hopeless 0  PHQ - 2 Score 0    Allergies  Allergen Reactions  . Advil [Ibuprofen] Hives  . Aspirin     NEEDS TO EC ASPIRIN  . Compazine [Prochlorperazine Edisylate] Other (See Comments)    Eyes rolled back, tongue curled up   Social History   Tobacco Use  . Smoking status: Never Smoker  . Smokeless tobacco: Never Used  Substance Use Topics  . Alcohol use: Yes    Comment: occ. rare   Past Medical History:  Diagnosis Date  . Allergic rhinitis   . Arthritis    Osteoarthrits-knees-hx. RTKA  . Asthma    environmental agents induced asthma  . Family history of adverse reaction to anesthesia    daughter very sensitive to anesthesia  . GERD (gastroesophageal reflux disease)   . Heart murmur    hx. mitral valve proplapse-mostly  asymptomatic-->not apprectiaed n echo  . Hepatic steatosis 03/05/2008   [place]  . Hyperlipidemia   . Hypertension   . Morbid obesity due to excess calories (HCC)   . STEMI (ST elevation myocardial infarction) (HCC) 10/2017   LAD treated with DESx1  . UTI (urinary tract infection)   . Vitamin D deficiency   . Vitamin D deficiency    Past Surgical History:  Procedure Laterality Date  . 5th finger surgery Right 1990   unknown injury  . CHOLECYSTECTOMY    . COLONOSCOPY WITH PROPOFOL N/A 06/08/2016   Procedure: COLONOSCOPY WITH PROPOFOL;  Surgeon: Dorena Cookey, MD;  Location: [facility] ENDOSCOPY;  Service: Endoscopy;  Laterality: N/A;  . CORONARY STENT INTERVENTION N/A 11/01/2017   Procedure: CORONARY STENT INTERVENTION;  Surgeon: Swaziland, Peter M, MD;  Location: [facility] INVASIVE CV LAB;  Service: Cardiovascular;  Laterality: N/A;  Resolute Onyx 4.5 mm x12 mm  . CORONARY/GRAFT ACUTE MI REVASCULARIZATION N/A 11/01/2017   Procedure: Coronary/Graft Acute MI Revascularization;  Surgeon: Swaziland, Peter M, MD;  Location: [facility] INVASIVE CV LAB;  Service: Cardiovascular;  Laterality: N/A;  . KNEE ARTHROSCOPY  05-02-12   x2 right/ x1 left  . LEFT HEART CATH AND CORONARY ANGIOGRAPHY N/A 11/01/2017   Procedure: LEFT HEART CATH AND CORONARY ANGIOGRAPHY;  Surgeon: Swaziland, Peter M, MD;  Location: [facility] INVASIVE CV LAB;  Service: Cardiovascular;  Laterality: N/A;  . REPLACEMENT TOTAL KNEE  Right 10/2010  . TOTAL KNEE ARTHROPLASTY  05/10/2012   Procedure: TOTAL KNEE ARTHROPLASTY;  Surgeon: Loanne Drilling, MD;  Location: [facility] ORS;  Service: Orthopedics;  Laterality: Left;  . TUBAL LIGATION    . UMBILICAL HERNIA REPAIR     Family History  Problem Relation Age of Onset  . Hypertension Mother   . Arthritis Mother   . Lymphoma Mother   . Asthma Mother   . Cancer Mother   . Hearing loss Mother   . Miscarriages / [place] Mother   . COPD Father   . Alcohol abuse Father   . Arthritis Father   . Hearing loss Father   .  Hypertension Father   . Miscarriages / Stillbirths Sister   . Arthritis Maternal Grandmother   . Hearing loss Maternal Grandmother   . Arthritis Maternal Grandfather   . Diabetes Maternal Grandfather   . Hypertension Maternal Grandfather   . Stroke Maternal Grandfather   . Arthritis Paternal Grandmother   . Diabetes Paternal Grandmother   . Hearing loss Paternal Grandmother   . Hearing loss Paternal Grandfather   . Learning disabilities Paternal Grandfather    Allergies as of 12/21/2017      Reactions   Advil [ibuprofen] Hives   Aspirin    NEEDS TO EC ASPIRIN   Compazine [prochlorperazine Edisylate] Other (See Comments)   Eyes rolled back, tongue curled up      Medication List        Accurate as of 12/21/17  4:09 PM. Always use your most recent med list.          acetaminophen 325 MG tablet Commonly known as:  TYLENOL Take 325 mg by mouth every 6 (six) hours as needed.   albuterol 108 (90 Base) MCG/ACT inhaler Commonly known as:  PROVENTIL HFA;VENTOLIN HFA Inhale 2 puffs into the lungs every 6 (six) hours as needed for wheezing or shortness of breath.   aspirin 81 MG EC tablet Take 1 tablet (81 mg total) by mouth daily.   atorvastatin 80 MG tablet Commonly known as:  LIPITOR Take 1 tablet (80 mg total) by mouth daily at 6 PM.   benzonatate 100 MG capsule Commonly known as:  TESSALON benzonatate 100 mg capsule   BEPREVE 1.5 % Soln Generic drug:  Bepotastine Besilate Place 1 drop into both eyes daily as needed. For itchy eyes   budesonide-formoterol 160-4.5 MCG/ACT inhaler Commonly known as:  SYMBICORT Inhale 2 puffs into the lungs 2 (two) times daily.   cholecalciferol 1000 units tablet Commonly known as:  VITAMIN D Take 1,000 Units by mouth 2 (two) times daily.   diclofenac sodium 1 % Gel Commonly known as:  VOLTAREN diclofenac 1 % topical gel   levalbuterol 0.63 MG/3ML nebulizer solution Commonly known as:  XOPENEX Take 0.63 mg by nebulization every 4  (four) hours as needed for wheezing or shortness of breath.   levalbuterol 45 MCG/ACT inhaler Commonly known as:  XOPENEX HFA Inhale 2 puffs into the lungs every 4 (four) hours as needed for wheezing.   levocetirizine 5 MG tablet Commonly known as:  XYZAL Take 5 mg by mouth every evening.   lisinopril 5 MG tablet Commonly known as:  PRINIVIL,ZESTRIL Take 1 tablet (5 mg total) by mouth daily.   metoprolol succinate 50 MG 24 hr tablet Commonly known as:  TOPROL-XL Take 1 tablet (50 mg total) by mouth daily. Take with or immediately following a meal.   mometasone 50 MCG/ACT nasal spray Commonly known as:  NASONEX Place 2 sprays into the nose every morning.   montelukast 10 MG tablet Commonly known as:  SINGULAIR Take 10 mg by mouth at bedtime.   multivitamin tablet Take 1 tablet by mouth daily.   nitroGLYCERIN 0.4 MG SL tablet Commonly known as:  NITROSTAT Place 1 tablet (0.4 mg total) under the tongue every 5 (five) minutes as needed for chest pain.   ticagrelor 90 MG Tabs tablet Commonly known as:  BRILINTA Take 1 tablet (90 mg total) by mouth 2 (two) times daily.       All past medical history, surgical history, allergies, family history, immunizations andmedications were updated in the EMR today and reviewed under the history and medication portions of their EMR.     ROS: Negative, with the exception of above mentioned in HPI   Objective:  BP 113/69 (BP Location: Left Arm, Patient Position: Sitting, Cuff Size: Normal)   Pulse 73   Temp 98.1 F (36.7 C)   Resp 20   Ht 5' 7.75" (1.721 m)   Wt (!) 318 lb 9.6 oz (144.5 kg)   SpO2 96%   BMI 48.80 kg/m  Body mass index is 48.8 kg/m. Gen: Afebrile. No acute distress. Nontoxic in appearance, well developed, well nourished.  HENT: AT. [place].  MMM. TM without fullness or erythema. Nares with redness or drainage. No cough. No erythema or exudates of throat.  Eyes:Pupils Equal Round Reactive to light, Extraocular  movements intact,  Conjunctiva without redness, discharge or icterus. Neck/lymp/endocrine: Supple,no lymphadenopathy CV: RRR no murmur, no edema Chest: CTAB, no wheeze or crackles. Good air movement, normal resp effort.  Abd: Soft. NTND. BS present. no Masses palpated. No rebound or guarding.  MSK: cervical spine neg for bone TTP, FROM, no dizziness. Neg spurlings test.  Neuro:  Normal gait. PERLA. EOMi. Alert. Oriented x3  Psych: Normal affect, dress and demeanor. Normal speech. Normal thought content and judgment.  No exam data present No results found. No results found for this or any previous visit (from the past 24 hour(s)).  Assessment/Plan: TEMAKA OSU is a 61 y.o. female present for OV for  Lightheadedness/ Nausea Uncertain etiology of nausea and lightheadedness. It has resolved. Suspect poss hyperglycemia as potential cause.  Pt encouraged to monitor BP and sugar if occurs again.  Avoid high sugar drinks/meals.  Hydrate.  F/u PRN     Reviewed expectations re: course of current medical issues.  Discussed self-management of symptoms.  Outlined signs and symptoms indicating need for more acute intervention.  Patient verbalized understanding and all questions were answered.  Patient received an After-Visit Summary.    No orders of the defined types were placed in this encounter.    Note is dictated utilizing voice recognition software. Although note has been proof read prior to signing, occasional typographical errors still can be missed. If any questions arise, please do not hesitate to call for verification.   electronically signed by:  Felix Pacini, DO  [facility] - [place]

## 2017-12-24 ENCOUNTER — Encounter: Payer: Self-pay | Admitting: Family Medicine

## 2017-12-28 ENCOUNTER — Telehealth (HOSPITAL_COMMUNITY): Payer: Self-pay | Admitting: Pharmacist

## 2017-12-28 NOTE — Progress Notes (Signed)
Alyssa Phillips 62 y.o. female DOB 08-Jun-1955 MRN [id number]       Nutrition  No diagnosis found. Past Medical History:  Diagnosis Date  . Allergic rhinitis   . Arthritis    Osteoarthrits-knees-hx. RTKA  . Asthma    environmental agents induced asthma  . Family history of adverse reaction to anesthesia    daughter very sensitive to anesthesia  . GERD (gastroesophageal reflux disease)   . Heart murmur    hx. mitral valve proplapse-mostly asymptomatic-->not apprectiaed n echo  . Hepatic steatosis 03/05/2008   [place]  . Hyperlipidemia   . Hypertension   . Morbid obesity due to excess calories (HCC)   . STEMI (ST elevation myocardial infarction) (HCC) 10/2017   LAD treated with DESx1  . UTI (urinary tract infection)   . Vitamin D deficiency   . Vitamin D deficiency    Meds reviewed.     Current Outpatient Medications (Cardiovascular):  .  atorvastatin (LIPITOR) 80 MG tablet, Take 1 tablet (80 mg total) by mouth daily at 6 PM. .  lisinopril (PRINIVIL,ZESTRIL) 5 MG tablet, Take 1 tablet (5 mg total) by mouth daily. .  metoprolol succinate (TOPROL-XL) 50 MG 24 hr tablet, Take 1 tablet (50 mg total) by mouth daily. Take with or immediately following a meal. .  nitroGLYCERIN (NITROSTAT) 0.4 MG SL tablet, Place 1 tablet (0.4 mg total) under the tongue every 5 (five) minutes as needed for chest pain.  Current Outpatient Medications (Respiratory):  .  albuterol (PROVENTIL HFA;VENTOLIN HFA) 108 (90 Base) MCG/ACT inhaler, Inhale 2 puffs into the lungs every 6 (six) hours as needed for wheezing or shortness of breath. .  benzonatate (TESSALON) 100 MG capsule, benzonatate 100 mg capsule .  budesonide-formoterol (SYMBICORT) 160-4.5 MCG/ACT inhaler, Inhale 2 puffs into the lungs 2 (two) times daily. Marland Kitchen  levalbuterol (XOPENEX HFA) 45 MCG/ACT inhaler, Inhale 2 puffs into the lungs every 4 (four) hours as needed for wheezing. .  levalbuterol (XOPENEX) 0.63 MG/3ML nebulizer solution, Take 0.63 mg  by nebulization every 4 (four) hours as needed for wheezing or shortness of breath. .  levocetirizine (XYZAL) 5 MG tablet, Take 5 mg by mouth every evening. .  mometasone (NASONEX) 50 MCG/ACT nasal spray, Place 2 sprays into the nose every morning. .  montelukast (SINGULAIR) 10 MG tablet, Take 10 mg by mouth at bedtime.  Current Outpatient Medications (Analgesics):  .  acetaminophen (TYLENOL) 325 MG tablet, Take 325 mg by mouth every 6 (six) hours as needed. Marland Kitchen  aspirin EC 81 MG EC tablet, Take 1 tablet (81 mg total) by mouth daily.  Current Outpatient Medications (Hematological):  .  ticagrelor (BRILINTA) 90 MG TABS tablet, Take 1 tablet (90 mg total) by mouth 2 (two) times daily.  Current Outpatient Medications (Other):  Marland Kitchen  Bepotastine Besilate (BEPREVE) 1.5 % SOLN, Place 1 drop into both eyes daily as needed. For itchy eyes .  cholecalciferol (VITAMIN D) 1000 units tablet, Take 1,000 Units by mouth 2 (two) times daily. .  diclofenac sodium (VOLTAREN) 1 % GEL, diclofenac 1 % topical gel .  Multiple Vitamin (MULTIVITAMIN) tablet, Take 1 tablet by mouth daily.   HT: Ht Readings from Last 1 Encounters:  12/21/17 5' 7.75" (1.721 m)    WT: Wt Readings from Last 5 Encounters:  12/21/17 (!) 318 lb 9.6 oz (144.5 kg)  12/04/17 (!) 318 lb (144.2 kg)  11/08/17 (!) 328 lb (148.8 kg)  11/01/17 (!) 340 lb (154.2 kg)  03/16/17 (!) 315 lb (142.9  kg)     BMI 48.8   Current tobacco use? No       Labs:  Lipid Panel     Component Value Date/Time   CHOL 240 (H) 11/01/2017 0201   TRIG 126 11/01/2017 0201   HDL 49 11/01/2017 0201   CHOLHDL 4.9 11/01/2017 0201   VLDL 25 11/01/2017 0201   LDLCALC 166 (H) 11/01/2017 0201    Lab Results  Component Value Date   HGBA1C 5.6 12/04/2017   CBG (last 3)  No results for input(s): GLUCAP in the last 72 hours.  Nutrition Diagnosis ? Food-and nutrition-related knowledge deficit related to lack of exposure to information as related to diagnosis of:  ? CVD  ? Obesity related to excessive energy intake as evidenced by a BMI of 48.8  Nutrition Goal(s):  ? To be determined  Plan:  Pt to attend nutrition classes ? Nutrition I ? Nutrition II ? Portion Distortion  Will provide client-centered nutrition education as part of interdisciplinary care.   Monitor and evaluate progress toward nutrition goal with team.  Ross MarcusAubrey Burklin, MS, RD, LDN 12/28/2017 12:22 PM

## 2017-12-31 ENCOUNTER — Telehealth (HOSPITAL_COMMUNITY): Payer: Self-pay | Admitting: Pharmacist

## 2017-12-31 NOTE — Telephone Encounter (Signed)
Cardiac Rehab - Pharmacy Resident Documentation   Patient unable to be reached after three call attempts. Please complete allergy verification and medication review during patient's cardiac rehab appointment.    Alyssa Phillips, PharmD PGY1 Pharmacy Resident

## 2018-01-01 ENCOUNTER — Encounter (HOSPITAL_COMMUNITY): Payer: Self-pay

## 2018-01-01 ENCOUNTER — Encounter (HOSPITAL_COMMUNITY)
Admission: RE | Admit: 2018-01-01 | Discharge: 2018-01-01 | Disposition: A | Discharge status: Home / Self Care | Payer: BLUE CROSS/BLUE SHIELD | Source: Ambulatory Visit | Attending: Cardiology | Admitting: Cardiology

## 2018-01-01 VITALS — BP 98/60 | HR 100 | Ht 68.25 in | Wt 312.8 lb

## 2018-01-01 DIAGNOSIS — K219 Gastro-esophageal reflux disease without esophagitis: Secondary | ICD-10-CM | POA: Insufficient documentation

## 2018-01-01 DIAGNOSIS — Z955 Presence of coronary angioplasty implant and graft: Secondary | ICD-10-CM | POA: Diagnosis not present

## 2018-01-01 DIAGNOSIS — Z7902 Long term (current) use of antithrombotics/antiplatelets: Secondary | ICD-10-CM | POA: Insufficient documentation

## 2018-01-01 DIAGNOSIS — I252 Old myocardial infarction: Secondary | ICD-10-CM | POA: Diagnosis not present

## 2018-01-01 DIAGNOSIS — I10 Essential (primary) hypertension: Secondary | ICD-10-CM | POA: Insufficient documentation

## 2018-01-01 DIAGNOSIS — E785 Hyperlipidemia, unspecified: Secondary | ICD-10-CM | POA: Insufficient documentation

## 2018-01-01 DIAGNOSIS — J45909 Unspecified asthma, uncomplicated: Secondary | ICD-10-CM | POA: Insufficient documentation

## 2018-01-01 DIAGNOSIS — Z96651 Presence of right artificial knee joint: Secondary | ICD-10-CM | POA: Diagnosis not present

## 2018-01-01 DIAGNOSIS — E559 Vitamin D deficiency, unspecified: Secondary | ICD-10-CM | POA: Diagnosis not present

## 2018-01-01 DIAGNOSIS — Z7951 Long term (current) use of inhaled steroids: Secondary | ICD-10-CM | POA: Insufficient documentation

## 2018-01-01 DIAGNOSIS — Z7982 Long term (current) use of aspirin: Secondary | ICD-10-CM | POA: Insufficient documentation

## 2018-01-01 DIAGNOSIS — Z6841 Body Mass Index (BMI) 40.0 and over, adult: Secondary | ICD-10-CM | POA: Insufficient documentation

## 2018-01-01 DIAGNOSIS — K760 Fatty (change of) liver, not elsewhere classified: Secondary | ICD-10-CM | POA: Insufficient documentation

## 2018-01-01 DIAGNOSIS — I2510 Atherosclerotic heart disease of native coronary artery without angina pectoris: Secondary | ICD-10-CM | POA: Insufficient documentation

## 2018-01-01 DIAGNOSIS — I2102 ST elevation (STEMI) myocardial infarction involving left anterior descending coronary artery: Secondary | ICD-10-CM

## 2018-01-01 DIAGNOSIS — Z79899 Other long term (current) drug therapy: Secondary | ICD-10-CM | POA: Diagnosis not present

## 2018-01-01 HISTORY — DX: Atherosclerotic heart disease of native coronary artery without angina pectoris: I25.10

## 2018-01-01 NOTE — Progress Notes (Signed)
Cardiac Individual Treatment Plan  Patient Details  Name: Alyssa Phillips MRN: [id number] Date of Birth: 04-17-1956 Referring Provider:     CARDIAC REHAB PHASE II ORIENTATION from 01/01/2018 in [facility] CARDIAC REHAB  Referring Provider  [place], Peter, MD      Initial Encounter Date:    CARDIAC REHAB PHASE II ORIENTATION from 01/01/2018 in [facility] CARDIAC REHAB  Date  01/01/18      Visit Diagnosis: 10/31/17 STEMI  10/31/17 DES LAD  Patient's Home Medications on Admission:  Current Outpatient Medications:  .  acetaminophen (TYLENOL) 325 MG tablet, Take 325 mg by mouth every 6 (six) hours as needed., Disp: , Rfl:  .  albuterol (PROVENTIL HFA;VENTOLIN HFA) 108 (90 Base) MCG/ACT inhaler, Inhale 2 puffs into the lungs every 6 (six) hours as needed for wheezing or shortness of breath., Disp: , Rfl:  .  aspirin EC 81 MG EC tablet, Take 1 tablet (81 mg total) by mouth daily., Disp: , Rfl:  .  atorvastatin (LIPITOR) 80 MG tablet, Take 1 tablet (80 mg total) by mouth daily at 6 PM., Disp: 30 tablet, Rfl: 5 .  benzonatate (TESSALON) 100 MG capsule, benzonatate 100 mg capsule, Disp: , Rfl:  .  Bepotastine Besilate (BEPREVE) 1.5 % SOLN, Place 1 drop into both eyes daily as needed. For itchy eyes, Disp: , Rfl:  .  budesonide-formoterol (SYMBICORT) 160-4.5 MCG/ACT inhaler, Inhale 2 puffs into the lungs 2 (two) times daily., Disp: , Rfl:  .  cholecalciferol (VITAMIN D) 1000 units tablet, Take 1,000 Units by mouth 2 (two) times daily., Disp: , Rfl:  .  diclofenac sodium (VOLTAREN) 1 % GEL, diclofenac 1 % topical gel, Disp: , Rfl:  .  levalbuterol (XOPENEX HFA) 45 MCG/ACT inhaler, Inhale 2 puffs into the lungs every 4 (four) hours as needed for wheezing., Disp: 1 Inhaler, Rfl: 0 .  levalbuterol (XOPENEX) 0.63 MG/3ML nebulizer solution, Take 0.63 mg by nebulization every 4 (four) hours as needed for wheezing or shortness of breath., Disp: , Rfl:  .   levocetirizine (XYZAL) 5 MG tablet, Take 5 mg by mouth every evening., Disp: , Rfl:  .  lisinopril (PRINIVIL,ZESTRIL) 5 MG tablet, Take 1 tablet (5 mg total) by mouth daily., Disp: 30 tablet, Rfl: 5 .  metoprolol succinate (TOPROL-XL) 50 MG 24 hr tablet, Take 1 tablet (50 mg total) by mouth daily. Take with or immediately following a meal., Disp: 30 tablet, Rfl: 5 .  mometasone (NASONEX) 50 MCG/ACT nasal spray, Place 2 sprays into the nose every morning., Disp: , Rfl:  .  montelukast (SINGULAIR) 10 MG tablet, Take 10 mg by mouth at bedtime., Disp: , Rfl:  .  Multiple Vitamin (MULTIVITAMIN) tablet, Take 1 tablet by mouth daily., Disp: , Rfl:  .  nitroGLYCERIN (NITROSTAT) 0.4 MG SL tablet, Place 1 tablet (0.4 mg total) under the tongue every 5 (five) minutes as needed for chest pain., Disp: 25 tablet, Rfl: 2 .  ticagrelor (BRILINTA) 90 MG TABS tablet, Take 1 tablet (90 mg total) by mouth 2 (two) times daily., Disp: 180 tablet, Rfl: 3  Past Medical History: Past Medical History:  Diagnosis Date  . Allergic rhinitis   . Arthritis    Osteoarthrits-knees-hx. RTKA  . Asthma    environmental agents induced asthma  . Coronary artery disease   . Family history of adverse reaction to anesthesia    daughter very sensitive to anesthesia  . GERD (gastroesophageal reflux disease)   . Heart murmur  hx. mitral valve proplapse-mostly asymptomatic-->not apprectiaed n echo  . Hepatic steatosis 03/05/2008   [place]  . Hyperlipidemia   . Hypertension   . Morbid obesity due to excess calories (HCC)   . STEMI (ST elevation myocardial infarction) (HCC) 10/2017   LAD treated with DESx1  . UTI (urinary tract infection)   . Vitamin D deficiency   . Vitamin D deficiency     Tobacco Use: Social History   Tobacco Use  Smoking Status Never Smoker  Smokeless Tobacco Never Used    Labs: Recent Review Flowsheet Data    Labs for ITP Cardiac and Pulmonary Rehab Latest Ref Rng & Units 04/07/2016 10/31/2016  11/01/2017 12/04/2017   Cholestrol 0 - 200 mg/dL 161(W) - 960(A) -   LDLCALC 0 - 99 mg/dL 540 981 191(Y) -   HDL >40 mg/dL 44 47 49 -   Trlycerides <150 mg/dL 782 956 213 -   Hemoglobin A1c 4.6 - 6.5 % - - - 5.6   TCO2 22 - 32 mmol/L - - 22 -      Capillary Blood Glucose: No results found for: GLUCAP   Exercise Target Goals: Exercise Program Goal: Individual exercise prescription set using results from initial 6 min walk test and THRR while considering  patient's activity barriers and safety.   Exercise Prescription Goal: Initial exercise prescription builds to 30-45 minutes a day of aerobic activity, 2-3 days per week.  Home exercise guidelines will be given to patient during program as part of exercise prescription that the participant will acknowledge.  Activity Barriers & Risk Stratification: Activity Barriers & Cardiac Risk Stratification - 01/01/18 0816      Activity Barriers & Cardiac Risk Stratification   Activity Barriers  Left Knee Replacement;Right Knee Replacement;Arthritis;Other (comment)    Comments  Bursitis left shoulder, arthritis top of left foot, bilateral heel spurs    Cardiac Risk Stratification  High       6 Minute Walk: 6 Minute Walk    Row Name 01/01/18 0829         6 Minute Walk   Phase  Initial     Distance  1204 feet     Walk Time  6 minutes     # of Rest Breaks  0     MPH  2.28     METS  2.24     RPE  11     VO2 Peak  7.84     Symptoms  Yes (comment)     Comments  Patient c/o feeling "breathy" after walk test.     Resting HR  100 bpm     Resting BP  98/60     Resting Oxygen Saturation   96 %     Exercise Oxygen Saturation  during 6 min walk  97 %     Max Ex. HR  131 bpm     Max Ex. BP  124/80     2 Minute Post BP  104/60        Oxygen Initial Assessment:   Oxygen Re-Evaluation:   Oxygen Discharge (Final Oxygen Re-Evaluation):   Initial Exercise Prescription: Initial Exercise Prescription - 01/01/18 1100      Date of  Initial Exercise RX and Referring Provider   Date  01/01/18    Referring Provider  [place], Peter, MD      NuStep   Level  3    SPM  85    Minutes  10    METs  2.5  Arm Ergometer   Level  2    Minutes  10    METs  2.5      Track   Laps  10    Minutes  10    METs  2.74      Prescription Details   Frequency (times per week)  3    Duration  Progress to 30 minutes of continuous aerobic without signs/symptoms of physical distress      Intensity   THRR 40-80% of Max Heartrate  63-126    Ratings of Perceived Exertion  11-13    Perceived Dyspnea  0-4      Progression   Progression  Continue to progress workloads to maintain intensity without signs/symptoms of physical distress.      Resistance Training   Training Prescription  Yes    Weight  3lbs    Reps  10-15       Perform Capillary Blood Glucose checks as needed.  Exercise Prescription Changes:   Exercise Comments:   Exercise Goals and Review:  Exercise Goals    Row Name 01/01/18 0820             Exercise Goals   Increase Physical Activity  Yes       Intervention  Provide advice, education, support and counseling about physical activity/exercise needs.;Develop an individualized exercise prescription for aerobic and resistive training based on initial evaluation findings, risk stratification, comorbidities and participant's personal goals.       Expected Outcomes  Short Term: Attend rehab on a regular basis to increase amount of physical activity.;Long Term: Exercising regularly at least 3-5 days a week.;Long Term: Add in home exercise to make exercise part of routine and to increase amount of physical activity.       Increase Strength and Stamina  Yes       Intervention  Provide advice, education, support and counseling about physical activity/exercise needs.;Develop an individualized exercise prescription for aerobic and resistive training based on initial evaluation findings, risk stratification,  comorbidities and participant's personal goals.       Expected Outcomes  Short Term: Increase workloads from initial exercise prescription for resistance, speed, and METs.;Short Term: Perform resistance training exercises routinely during rehab and add in resistance training at home;Long Term: Improve cardiorespiratory fitness, muscular endurance and strength as measured by increased METs and functional capacity (6MWT)       Able to understand and use rate of perceived exertion (RPE) scale  Yes       Intervention  Provide education and explanation on how to use RPE scale       Expected Outcomes  Short Term: Able to use RPE daily in rehab to express subjective intensity level;Long Term:  Able to use RPE to guide intensity level when exercising independently       Knowledge and understanding of Target Heart Rate Range (THRR)  Yes       Intervention  Provide education and explanation of THRR including how the numbers were predicted and where they are located for reference       Expected Outcomes  Short Term: Able to state/look up THRR;Long Term: Able to use THRR to govern intensity when exercising independently;Short Term: Able to use daily as guideline for intensity in rehab       Able to check pulse independently  Yes       Intervention  Provide education and demonstration on how to check pulse in carotid and radial arteries.;Review the importance of being able  to check your own pulse for safety during independent exercise       Expected Outcomes  Short Term: Able to explain why pulse checking is important during independent exercise;Long Term: Able to check pulse independently and accurately       Understanding of Exercise Prescription  Yes       Intervention  Provide education, explanation, and written materials on patient's individual exercise prescription       Expected Outcomes  Short Term: Able to explain program exercise prescription;Long Term: Able to explain home exercise prescription to  exercise independently          Exercise Goals Re-Evaluation :   Discharge Exercise Prescription (Final Exercise Prescription Changes):   Nutrition:  Target Goals: Understanding of nutrition guidelines, daily intake of sodium 1500mg , cholesterol 200mg , calories 30% from fat and 7% or less from saturated fats, daily to have 5 or more servings of fruits and vegetables.  Biometrics: Pre Biometrics - 01/01/18 1155      Pre Biometrics   Height  5' 8.25" (1.734 m)    Weight  (!) 141.9 kg    Waist Circumference  50.5 inches    Hip Circumference  60 inches    Waist to Hip Ratio  0.84 %    BMI (Calculated)  47.19    Triceps Skinfold  60 mm    % Body Fat  57.7 %    Grip Strength  36 kg    Flexibility  14 in    Single Leg Stand  4.18 seconds        Nutrition Therapy Plan and Nutrition Goals: Nutrition Therapy & Goals - 01/01/18 0916      Nutrition Therapy   Diet  heart healthy, carb modified      Personal Nutrition Goals   Nutrition Goal  Pt to identify and limit food sources of saturated fat, trans fat, refined carbohydrates and sodium    Personal Goal #2  Pt to identify food quantities necessary to achieve weight loss of 6-24 lbs. at graduation from cardiac rehab.    Personal Goal #3  Pt able to name foods that affect blood glucose      Intervention Plan   Intervention  Prescribe, educate and counsel regarding individualized specific dietary modifications aiming towards targeted core components such as weight, hypertension, lipid management, diabetes, heart failure and other comorbidities.    Expected Outcomes  Short Term Goal: Understand basic principles of dietary content, such as calories, fat, sodium, cholesterol and nutrients.;Long Term Goal: Adherence to prescribed nutrition plan.       Nutrition Assessments: Nutrition Assessments - 01/01/18 0917      MEDFICTS Scores   Pre Score  12       Nutrition Goals Re-Evaluation:   Nutrition Goals  Re-Evaluation:   Nutrition Goals Discharge (Final Nutrition Goals Re-Evaluation):   Psychosocial: Target Goals: Acknowledge presence or absence of significant depression and/or stress, maximize coping skills, provide positive support system. Participant is able to verbalize types and ability to use techniques and skills needed for reducing stress and depression.  Initial Review & Psychosocial Screening: Initial Psych Review & Screening - 01/01/18 1221      Initial Review   Current issues with  None Identified      Family Dynamics   Good Support System?  Yes      Barriers   Psychosocial barriers to participate in program  There are no identifiable barriers or psychosocial needs.      Screening Interventions  Interventions  Encouraged to exercise       Quality of Life Scores: Quality of Life - 01/01/18 1030      Quality of Life   Select  Quality of Life      Quality of Life Scores   Health/Function Pre  23.6 %    Socioeconomic Pre  29.25 %    Psych/Spiritual Pre  26.57 %    Family Pre  28.8 %    GLOBAL Pre  26.23 %      Scores of 19 and below usually indicate a poorer quality of life in these areas.  A difference of  2-3 points is a clinically meaningful difference.  A difference of 2-3 points in the total score of the Quality of Life Index has been associated with significant improvement in overall quality of life, self-image, physical symptoms, and general health in studies assessing change in quality of life.  PHQ-9: Recent Review Flowsheet Data    Depression screen [facility] 2/9 12/04/2017   Decreased Interest 0   Down, Depressed, Hopeless 0   PHQ - 2 Score 0     Interpretation of Total Score  Total Score Depression Severity:  1-4 = Minimal depression, 5-9 = Mild depression, 10-14 = Moderate depression, 15-19 = Moderately severe depression, 20-27 = Severe depression   Psychosocial Evaluation and Intervention:   Psychosocial Re-Evaluation:   Psychosocial  Discharge (Final Psychosocial Re-Evaluation):   Vocational Rehabilitation: Provide vocational rehab assistance to qualifying candidates.   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 Participant will state understanding/return demonstration of topics presented.  Learning Barriers/Preferences: Learning Barriers/Preferences - 01/01/18 1225      Learning Barriers/Preferences   Learning Barriers  Sight   Wears glasses   Learning Preferences  Pictoral;Skilled Demonstration       Education Topics: Count Your Pulse:  -Group instruction provided by verbal instruction, demonstration, patient participation and written materials to support subject.  Instructors address importance of being able to find your pulse and how to count your pulse when at home without a heart monitor.  Patients get hands on experience counting their pulse with staff help and individually.   Heart Attack, Angina, and Risk Factor Modification:  -Group instruction provided by verbal instruction, video, and written materials to support subject.  Instructors address signs and symptoms of angina and heart attacks.    Also discuss risk factors for heart disease and how to make changes to improve heart health risk factors.   Functional Fitness:  -Group instruction provided by verbal instruction, demonstration, patient participation, and written materials to support subject.  Instructors address safety measures for doing things around the house.  Discuss how to get up and down off the floor, how to pick things up properly, how to safely get out of a chair without assistance, and balance training.   Meditation and Mindfulness:  -Group instruction  provided by verbal instruction, patient participation, and written materials to support subject.  Instructor addresses importance of mindfulness and meditation practice to help reduce stress and improve awareness.  Instructor also leads participants through a meditation exercise.    Stretching for Flexibility and Mobility:  -Group instruction provided by verbal instruction, patient participation, and written materials to support  subject.  Instructors lead participants through series of stretches that are designed to increase flexibility thus improving mobility.  These stretches are additional exercise for major muscle groups that are typically performed during regular warm up and cool down.   Hands Only CPR:  -Group verbal, video, and participation provides a basic overview of AHA guidelines for community CPR. Role-play of emergencies allow participants the opportunity to practice calling for help and chest compression technique with discussion of AED use.   Hypertension: -Group verbal and written instruction that provides a basic overview of hypertension including the most recent diagnostic guidelines, risk factor reduction with self-care instructions and medication management.    Nutrition I class: Heart Healthy Eating:  -Group instruction provided by PowerPoint slides, verbal discussion, and written materials to support subject matter. The instructor gives an explanation and review of the Therapeutic Lifestyle Changes diet recommendations, which includes a discussion on lipid goals, dietary fat, sodium, fiber, plant stanol/sterol esters, sugar, and the components of a well-balanced, healthy diet.   Nutrition II class: Lifestyle Skills:  -Group instruction provided by PowerPoint slides, verbal discussion, and written materials to support subject matter. The instructor gives an explanation and review of label reading, grocery shopping for heart health, heart healthy recipe modifications, and  ways to make healthier choices when eating out.   Diabetes Question & Answer:  -Group instruction provided by PowerPoint slides, verbal discussion, and written materials to support subject matter. The instructor gives an explanation and review of diabetes co-morbidities, pre- and post-prandial blood glucose goals, pre-exercise blood glucose goals, signs, symptoms, and treatment of hypoglycemia and hyperglycemia, and foot care basics.   Diabetes Blitz:  -Group instruction provided by PowerPoint slides, verbal discussion, and written materials to support subject matter. The instructor gives an explanation and review of the physiology behind type 1 and type 2 diabetes, diabetes medications and rational behind using different medications, pre- and post-prandial blood glucose recommendations and Hemoglobin A1c goals, diabetes diet, and exercise including blood glucose guidelines for exercising safely.    Portion Distortion:  -Group instruction provided by PowerPoint slides, verbal discussion, written materials, and food models to support subject matter. The instructor gives an explanation of serving size versus portion size, changes in portions sizes over the last 20 years, and what consists of a serving from each food group.   Stress Management:  -Group instruction provided by verbal instruction, video, and written materials to support subject matter.  Instructors review role of stress in heart disease and how to cope with stress positively.     Exercising on Your Own:  -Group instruction provided by verbal instruction, power point, and written materials to support subject.  Instructors discuss benefits of exercise, components of exercise, frequency and intensity of exercise, and end points for exercise.  Also discuss use of nitroglycerin and activating EMS.  Review options of places to exercise outside of rehab.  Review guidelines for sex with heart disease.   Cardiac Drugs I:  -Group  instruction provided by verbal instruction and written materials to support subject.  Instructor reviews cardiac drug classes: antiplatelets, anticoagulants, beta blockers, and statins.  Instructor discusses reasons, side effects, and lifestyle considerations for each drug class.   Cardiac Drugs II:  -Group instruction provided by verbal instruction and written materials to support subject.  Instructor reviews cardiac drug classes: angiotensin converting enzyme inhibitors (ACE-I), angiotensin II receptor blockers (ARBs), nitrates, and calcium channel blockers.  Instructor discusses reasons, side effects, and lifestyle considerations for each drug class.  Anatomy and Physiology of the Circulatory System:  Group verbal and written instruction and models provide basic cardiac anatomy and physiology, with the coronary electrical and arterial systems. Review of: AMI, Angina, Valve disease, Heart Failure, Peripheral Artery Disease, Cardiac Arrhythmia, Pacemakers, and the ICD.   Other Education:  -Group or individual verbal, written, or video instructions that support the educational goals of the cardiac rehab program.   Holiday Eating Survival Tips:  -Group instruction provided by PowerPoint slides, verbal discussion, and written materials to support subject matter. The instructor gives patients tips, tricks, and techniques to help them not only survive but enjoy the holidays despite the onslaught of food that accompanies the holidays.   Knowledge Questionnaire Score: Knowledge Questionnaire Score - 01/01/18 1027      Knowledge Questionnaire Score   Pre Score  21/24        Core Components/Risk Factors/Patient Goals at Admission: Personal Goals and Risk Factors at Admission - 01/01/18 1228      Core Components/Risk Factors/Patient Goals on Admission    Weight Management  Obesity;Yes    Intervention  Weight Management: Develop a combined nutrition and exercise program designed to reach  desired caloric intake, while maintaining appropriate intake of nutrient and fiber, sodium and fats, and appropriate energy expenditure required for the weight goal.;Weight Management: Provide education and appropriate resources to help participant work on and attain dietary goals.;Weight Management/Obesity: Establish reasonable short term and long term weight goals.;Obesity: Provide education and appropriate resources to help participant work on and attain dietary goals.    Admit Weight  312 lb 13.3 oz (141.9 kg)    Expected Outcomes  Short Term: Continue to assess and modify interventions until short term weight is achieved;Long Term: Adherence to nutrition and physical activity/exercise program aimed toward attainment of established weight goal;Weight Maintenance: Understanding of the daily nutrition guidelines, which includes 25-35% calories from fat, 7% or less cal from saturated fats, less than 200mg  cholesterol, less than 1.5gm of sodium, & 5 or more servings of fruits and vegetables daily;Weight Loss: Understanding of general recommendations for a balanced deficit meal plan, which promotes 1-2 lb weight loss per week and includes a negative energy balance of [id number] kcal/d;Understanding recommendations for meals to include 15-35% energy as protein, 25-35% energy from fat, 35-60% energy from carbohydrates, less than 200mg  of dietary cholesterol, 20-35 gm of total fiber daily;Understanding of distribution of calorie intake throughout the day with the consumption of 4-5 meals/snacks    Hypertension  Yes    Intervention  Provide education on lifestyle modifcations including regular physical activity/exercise, weight management, moderate sodium restriction and increased consumption of fresh fruit, vegetables, and low fat dairy, alcohol moderation, and smoking cessation.;Monitor prescription use compliance.    Expected Outcomes  Short Term: Continued assessment and intervention until BP is < 140/88mm HG in  hypertensive participants. < 130/33mm HG in hypertensive participants with diabetes, heart failure or chronic kidney disease.;Long Term: Maintenance of blood pressure at goal levels.    Lipids  Yes    Intervention  Provide education and support for participant on nutrition & aerobic/resistive exercise along with prescribed medications to achieve LDL 70mg , HDL >40mg .    Expected Outcomes  Short Term: Participant states understanding of desired cholesterol values and is compliant with medications prescribed. Participant is following exercise prescription and nutrition guidelines.;Long Term: Cholesterol controlled with medications as prescribed, with individualized exercise RX and with personalized nutrition plan. Value goals: LDL < 70mg , HDL > 40 mg.    Stress  Yes  Intervention  Offer individual and/or small group education and counseling on adjustment to heart disease, stress management and health-related lifestyle change. Teach and support self-help strategies.;Refer participants experiencing significant psychosocial distress to appropriate mental health specialists for further evaluation and treatment. When possible, include family members and significant others in education/counseling sessions.    Expected Outcomes  Short Term: Participant demonstrates changes in health-related behavior, relaxation and other stress management skills, ability to obtain effective social support, and compliance with psychotropic medications if prescribed.;Long Term: Emotional wellbeing is indicated by absence of clinically significant psychosocial distress or social isolation.       Core Components/Risk Factors/Patient Goals Review:    Core Components/Risk Factors/Patient Goals at Discharge (Final Review):    ITP Comments: ITP Comments    Row Name 01/01/18 0744           ITP Comments  Medical Director- Dr. Armanda Magic, MD          Comments: Jazmaine attended orientation from [phone] to [phone] to review rules  and guidelines for program. Completed 6 minute walk test, Intitial ITP, and exercise prescription.  VSS. Telemetry-Sinus rhythm with an occasional PVC. Shireen did report feeling "breathy" towards the end of her walk test otherwise patient asymptomatic.Gladstone Lighter, RN,BSN 01/01/2018 12:42 PM

## 2018-01-01 NOTE — Progress Notes (Signed)
Tawnya CrookMitzi B Bourbon 62 y.o. female DOB: 1955/05/18 MRN: [id number]      Nutrition Note  1. 10/31/17 STEMI   2. 10/31/17 DES LAD    Past Medical History:  Diagnosis Date  . Allergic rhinitis   . Arthritis    Osteoarthrits-knees-hx. RTKA  . Asthma    environmental agents induced asthma  . Family history of adverse reaction to anesthesia    daughter very sensitive to anesthesia  . GERD (gastroesophageal reflux disease)   . Heart murmur    hx. mitral valve proplapse-mostly asymptomatic-->not apprectiaed n echo  . Hepatic steatosis 03/05/2008   US  . Hyperlipidemia   . Hypertension   . Morbid obesity due to excess calories (HCC)   . STEMI (ST elevation myocardial infarction) (HCC) 10/2017   LAD treated with DESx1  . UTI (urinary tract infection)   . Vitamin D deficiency   . Vitamin D deficiency    Meds reviewed.    Current Outpatient Medications (Cardiovascular):  .  atorvastatin (LIPITOR) 80 MG tablet, Take 1 tablet (80 mg total) by mouth daily at 6 PM. .  lisinopril (PRINIVIL,ZESTRIL) 5 MG tablet, Take 1 tablet (5 mg total) by mouth daily. .  metoprolol succinate (TOPROL-XL) 50 MG 24 hr tablet, Take 1 tablet (50 mg total) by mouth daily. Take with or immediately following a meal. .  nitroGLYCERIN (NITROSTAT) 0.4 MG SL tablet, Place 1 tablet (0.4 mg total) under the tongue every 5 (five) minutes as needed for chest pain.  Current Outpatient Medications (Respiratory):  .  albuterol (PROVENTIL HFA;VENTOLIN HFA) 108 (90 Base) MCG/ACT inhaler, Inhale 2 puffs into the lungs every 6 (six) hours as needed for wheezing or shortness of breath. .  benzonatate (TESSALON) 100 MG capsule, benzonatate 100 mg capsule .  budesonide-formoterol (SYMBICORT) 160-4.5 MCG/ACT inhaler, Inhale 2 puffs into the lungs 2 (two) times daily. Marland Kitchen.  levalbuterol (XOPENEX HFA) 45 MCG/ACT inhaler, Inhale 2 puffs into the lungs every 4 (four) hours as needed for wheezing. .  levalbuterol (XOPENEX) 0.63 MG/3ML  nebulizer solution, Take 0.63 mg by nebulization every 4 (four) hours as needed for wheezing or shortness of breath. .  levocetirizine (XYZAL) 5 MG tablet, Take 5 mg by mouth every evening. .  mometasone (NASONEX) 50 MCG/ACT nasal spray, Place 2 sprays into the nose every morning. .  montelukast (SINGULAIR) 10 MG tablet, Take 10 mg by mouth at bedtime.  Current Outpatient Medications (Analgesics):  .  acetaminophen (TYLENOL) 325 MG tablet, Take 325 mg by mouth every 6 (six) hours as needed. Marland Kitchen.  aspirin EC 81 MG EC tablet, Take 1 tablet (81 mg total) by mouth daily.  Current Outpatient Medications (Hematological):  .  ticagrelor (BRILINTA) 90 MG TABS tablet, Take 1 tablet (90 mg total) by mouth 2 (two) times daily.  Current Outpatient Medications (Other):  Marland Kitchen.  Bepotastine Besilate (BEPREVE) 1.5 % SOLN, Place 1 drop into both eyes daily as needed. For itchy eyes .  cholecalciferol (VITAMIN D) 1000 units tablet, Take 1,000 Units by mouth 2 (two) times daily. .  diclofenac sodium (VOLTAREN) 1 % GEL, diclofenac 1 % topical gel .  Multiple Vitamin (MULTIVITAMIN) tablet, Take 1 tablet by mouth daily.   HT: Ht Readings from Last 1 Encounters:  12/21/17 5' 7.75" (1.721 m)    WT: Wt Readings from Last 5 Encounters:  12/21/17 (!) 318 lb 9.6 oz (144.5 kg)  12/04/17 (!) 318 lb (144.2 kg)  11/08/17 (!) 328 lb (148.8 kg)  11/01/17 (!) 340 lb (  154.2 kg)  03/16/17 (!) 315 lb (142.9 kg)     There is no height or weight on file to calculate BMI.   Current tobacco use? No  Labs:  Lipid Panel     Component Value Date/Time   CHOL 240 (H) 11/01/2017 0201   TRIG 126 11/01/2017 0201   HDL 49 11/01/2017 0201   CHOLHDL 4.9 11/01/2017 0201   VLDL 25 11/01/2017 0201   LDLCALC 166 (H) 11/01/2017 0201    Lab Results  Component Value Date   HGBA1C 5.6 12/04/2017   CBG (last 3)  No results for input(s): GLUCAP in the last 72 hours.  Nutrition Note Spoke with pt. Nutrition plan and goals reviewed  with pt. Pt is following Step 2 of the Therapeutic Lifestyle Changes diet. Pt wants to lose wt, 30-40 lbs overall as she was told a small 10% weight loss may help change blood lipids, blood glucose, and blood pressure. Pt has not actively been trying to lose wt. Pt has made several lifestyle changes with nutrition, consumes only lean protein, avoids most dairy except one yogurt daily, has changed her sodium intake to less than 1500mg  daily. Praised pt for these changes. Wt loss tips reviewed (label reading, how to build a healthy plate, portion sizes, eating frequently across the day). Per discussion, pt does not use canned/convenience foods often. Pt rarely adds salt to food. Pt eats out infrequently. Pts last A1C 5.6, cusp of pre-diabetes, discussed replacing refined carbohydrates with complex carbohydrates, pt agreed to try RD recommendations. Pt expressed understanding of the information reviewed. Pt aware of nutrition education classes offered and plans on attending nutrition classes.  Nutrition Diagnosis  ? Obesity related to excessive energy intake as evidenced by a BMI 48.8 kg/m2  Nutrition Intervention ? Pt's individual nutrition plan and goals reviewed with pt.  Nutrition Goal(s):   ? Pt to identify and limit food sources of saturated fat, trans fat, refined carbohydrates and sodium ? Pt to identify food quantities necessary to achieve weight loss of 6-24 lbs. at graduation from cardiac rehab. Pt would like to lose 30-40 lbs overall.   ? Pt able to name foods that affect blood glucose    Plan:  ? Pt to attend nutrition classes ? Nutrition I ? Nutrition II ? Portion Distortion  ? Diabetes Blitz ? Diabetes Q & Ae determined ? Will provide client-centered nutrition education as part of interdisciplinary care ? Monitor and evaluate progress toward nutrition goal with team.   Ross Marcus, MS, RD, LDN 01/01/2018 9:08 AM

## 2018-01-01 NOTE — Progress Notes (Addendum)
Cardiac Rehab Medication Review by a Registered Nurse   Does the patient  feel that his/her medications are working for him/her?  yes  Has the patient been experiencing any side effects to the medications prescribed?  no  Does the patient measure his/her own blood pressure or blood glucose at home?  yes   Does the patient have any problems obtaining medications due to transportation or finances?   no  Understanding of regimen: excellent Understanding of indications: excellent Potential of compliance: excellent    RN comments: pt verbalizes good understanding of her medication regimen.  Pt does check BP at home occasionally.      Alyssa CivatteJoann Phillips Alyssa Phillips 01/01/2018 11:55 AM

## 2018-01-02 ENCOUNTER — Telehealth: Payer: Self-pay | Admitting: Emergency Medicine

## 2018-01-02 DIAGNOSIS — I2102 ST elevation (STEMI) myocardial infarction involving left anterior descending coronary artery: Secondary | ICD-10-CM | POA: Diagnosis not present

## 2018-01-02 DIAGNOSIS — E782 Mixed hyperlipidemia: Secondary | ICD-10-CM | POA: Diagnosis not present

## 2018-01-02 LAB — HEPATIC FUNCTION PANEL
ALT: 20 IU/L (ref 0–32)
AST: 14 IU/L (ref 0–40)
Albumin: 4.2 g/dL (ref 3.6–4.8)
Alkaline Phosphatase: 78 IU/L (ref 39–117)
BILIRUBIN TOTAL: 0.7 mg/dL (ref 0.0–1.2)
BILIRUBIN, DIRECT: 0.21 mg/dL (ref 0.00–0.40)
TOTAL PROTEIN: 6.4 g/dL (ref 6.0–8.5)

## 2018-01-02 LAB — LIPID PANEL
CHOLESTEROL TOTAL: 100 mg/dL (ref 100–199)
Chol/HDL Ratio: 2.8 ratio (ref 0.0–4.4)
HDL: 36 mg/dL — ABNORMAL LOW (ref >39)
LDL CALC: 44 mg/dL (ref 0–99)
Triglycerides: 101 mg/dL (ref 0–149)
VLDL CHOLESTEROL CAL: 20 mg/dL (ref 5–40)

## 2018-01-02 NOTE — Telephone Encounter (Signed)
Patient reports "knots" 1 inch away from right radial site that was accessed during stent placement on 11/01/17. Patient states they have been there since 2 weeks after stent placement. They become tender at times however patient denies pain at this time. Patient has seen pcp for this and they believe it is only scar tissue however patient wants to be sure. Will route to Dr. Swaz[place]Jordan.

## 2018-01-02 NOTE — Telephone Encounter (Signed)
Most likely scar at this point. Unless it is causing her a lot of pain I would just observe and we can look at it on her visit in October.  Peter SwazilandJordan MD, [facility]

## 2018-01-03 NOTE — Telephone Encounter (Signed)
Patient informed to monitor site and unless causing severe pain to just discuss during next office visit in October. Dr. SwazilandJordan reports it is most likely scar tissue. She will notify us if worsens or becomes more painful. Patient verbally understands. No questions at this time.

## 2018-01-07 ENCOUNTER — Encounter (HOSPITAL_COMMUNITY): Payer: Self-pay

## 2018-01-07 ENCOUNTER — Encounter (HOSPITAL_COMMUNITY)
Admission: RE | Admit: 2018-01-07 | Discharge: 2018-01-07 | Disposition: A | Discharge status: Home / Self Care | Payer: BLUE CROSS/BLUE SHIELD | Source: Ambulatory Visit | Attending: Cardiology | Admitting: Cardiology

## 2018-01-07 ENCOUNTER — Encounter (HOSPITAL_COMMUNITY): Payer: BLUE CROSS/BLUE SHIELD

## 2018-01-07 DIAGNOSIS — Z7951 Long term (current) use of inhaled steroids: Secondary | ICD-10-CM | POA: Diagnosis not present

## 2018-01-07 DIAGNOSIS — K760 Fatty (change of) liver, not elsewhere classified: Secondary | ICD-10-CM | POA: Diagnosis not present

## 2018-01-07 DIAGNOSIS — Z6841 Body Mass Index (BMI) 40.0 and over, adult: Secondary | ICD-10-CM | POA: Diagnosis not present

## 2018-01-07 DIAGNOSIS — I252 Old myocardial infarction: Secondary | ICD-10-CM | POA: Diagnosis not present

## 2018-01-07 DIAGNOSIS — I10 Essential (primary) hypertension: Secondary | ICD-10-CM | POA: Diagnosis not present

## 2018-01-07 DIAGNOSIS — Z96651 Presence of right artificial knee joint: Secondary | ICD-10-CM | POA: Diagnosis not present

## 2018-01-07 DIAGNOSIS — I2102 ST elevation (STEMI) myocardial infarction involving left anterior descending coronary artery: Secondary | ICD-10-CM

## 2018-01-07 DIAGNOSIS — Z7902 Long term (current) use of antithrombotics/antiplatelets: Secondary | ICD-10-CM | POA: Diagnosis not present

## 2018-01-07 DIAGNOSIS — J45909 Unspecified asthma, uncomplicated: Secondary | ICD-10-CM | POA: Diagnosis not present

## 2018-01-07 DIAGNOSIS — I2510 Atherosclerotic heart disease of native coronary artery without angina pectoris: Secondary | ICD-10-CM | POA: Diagnosis not present

## 2018-01-07 DIAGNOSIS — Z955 Presence of coronary angioplasty implant and graft: Secondary | ICD-10-CM

## 2018-01-07 DIAGNOSIS — Z7982 Long term (current) use of aspirin: Secondary | ICD-10-CM | POA: Diagnosis not present

## 2018-01-07 DIAGNOSIS — E785 Hyperlipidemia, unspecified: Secondary | ICD-10-CM | POA: Diagnosis not present

## 2018-01-07 DIAGNOSIS — Z79899 Other long term (current) drug therapy: Secondary | ICD-10-CM | POA: Diagnosis not present

## 2018-01-07 DIAGNOSIS — K219 Gastro-esophageal reflux disease without esophagitis: Secondary | ICD-10-CM | POA: Diagnosis not present

## 2018-01-07 DIAGNOSIS — E559 Vitamin D deficiency, unspecified: Secondary | ICD-10-CM | POA: Diagnosis not present

## 2018-01-07 NOTE — Progress Notes (Signed)
Daily Session Note  Patient Details  Name: Alyssa Phillips MRN: [id number] Date of Birth: [birth year] Referring Provider:     CARDIAC REHAB PHASE II ORIENTATION from 01/01/2018 in [place]  Referring Provider  [place], Peter, MD      Encounter Date: 01/07/2018  Check In: Session Check In - 01/07/18 0744      Check-In   Supervising physician immediately available to respond to emergencies  [facility] immediately available    Physician(s)  Dr. Loleta Books    Location  [facility]    Staff Present  Seward Carol, MS, ACSM CEP, Exercise Physiologist;Joann Rion, RN, BSN;Molly DiVincenzo, MS, ACSM RCEP, Exercise Physiologist    Medication changes reported      No    Fall or balance concerns reported     No    Tobacco Cessation  No Change    Warm-up and Cool-down  Performed as group-led instruction    Resistance Training Performed  Yes    VAD Patient?  No    PAD/SET Patient?  No      Pain Assessment   Currently in Pain?  No/denies    Multiple Pain Sites  No       Capillary Blood Glucose: No results found for this or any previous visit (from the past 24 hour(s)).    Social History   Tobacco Use  Smoking Status Never Smoker  Smokeless Tobacco Never Used    Goals Met:  Exercise tolerated well  Goals Unmet:  Not Applicable  Comments: Pt started cardiac rehab today.  Pt tolerated light exercise without difficulty. VSS, telemetry-ST, asymptomatic.  Medication list reconciled. Pt denies barriers to medicaiton compliance.  PSYCHOSOCIAL ASSESSMENT:  PHQ-0. Pt exhibits positive coping skills, hopeful outlook with supportive family. No psychosocial needs identified at this time, no psychosocial interventions necessary.   Pt oriented to exercise equipment and routine.    Understanding verbalized.    Dr. Fransico Him is Medical Director for Cardiac Rehab at [facility]

## 2018-01-09 ENCOUNTER — Encounter (HOSPITAL_COMMUNITY)
Admission: RE | Admit: 2018-01-09 | Discharge: 2018-01-09 | Disposition: A | Discharge status: Home / Self Care | Payer: BLUE CROSS/BLUE SHIELD | Source: Ambulatory Visit | Attending: Cardiology | Admitting: Cardiology

## 2018-01-09 ENCOUNTER — Encounter (HOSPITAL_COMMUNITY): Payer: BLUE CROSS/BLUE SHIELD

## 2018-01-09 DIAGNOSIS — I2510 Atherosclerotic heart disease of native coronary artery without angina pectoris: Secondary | ICD-10-CM | POA: Diagnosis not present

## 2018-01-09 DIAGNOSIS — I10 Essential (primary) hypertension: Secondary | ICD-10-CM | POA: Diagnosis not present

## 2018-01-09 DIAGNOSIS — Z7951 Long term (current) use of inhaled steroids: Secondary | ICD-10-CM | POA: Diagnosis not present

## 2018-01-09 DIAGNOSIS — Z955 Presence of coronary angioplasty implant and graft: Secondary | ICD-10-CM | POA: Diagnosis not present

## 2018-01-09 DIAGNOSIS — J45909 Unspecified asthma, uncomplicated: Secondary | ICD-10-CM | POA: Diagnosis not present

## 2018-01-09 DIAGNOSIS — I2102 ST elevation (STEMI) myocardial infarction involving left anterior descending coronary artery: Secondary | ICD-10-CM

## 2018-01-09 DIAGNOSIS — Z96651 Presence of right artificial knee joint: Secondary | ICD-10-CM | POA: Diagnosis not present

## 2018-01-09 DIAGNOSIS — I252 Old myocardial infarction: Secondary | ICD-10-CM | POA: Diagnosis not present

## 2018-01-09 DIAGNOSIS — K760 Fatty (change of) liver, not elsewhere classified: Secondary | ICD-10-CM | POA: Diagnosis not present

## 2018-01-09 DIAGNOSIS — Z7902 Long term (current) use of antithrombotics/antiplatelets: Secondary | ICD-10-CM | POA: Diagnosis not present

## 2018-01-09 DIAGNOSIS — E785 Hyperlipidemia, unspecified: Secondary | ICD-10-CM | POA: Diagnosis not present

## 2018-01-09 DIAGNOSIS — K219 Gastro-esophageal reflux disease without esophagitis: Secondary | ICD-10-CM | POA: Diagnosis not present

## 2018-01-09 DIAGNOSIS — Z7982 Long term (current) use of aspirin: Secondary | ICD-10-CM | POA: Diagnosis not present

## 2018-01-09 DIAGNOSIS — E559 Vitamin D deficiency, unspecified: Secondary | ICD-10-CM | POA: Diagnosis not present

## 2018-01-09 DIAGNOSIS — Z79899 Other long term (current) drug therapy: Secondary | ICD-10-CM | POA: Diagnosis not present

## 2018-01-09 DIAGNOSIS — Z6841 Body Mass Index (BMI) 40.0 and over, adult: Secondary | ICD-10-CM | POA: Diagnosis not present

## 2018-01-11 ENCOUNTER — Encounter (HOSPITAL_COMMUNITY): Payer: BLUE CROSS/BLUE SHIELD

## 2018-01-11 ENCOUNTER — Encounter (HOSPITAL_COMMUNITY)
Admission: RE | Admit: 2018-01-11 | Discharge: 2018-01-11 | Disposition: A | Discharge status: Home / Self Care | Payer: BLUE CROSS/BLUE SHIELD | Source: Ambulatory Visit | Attending: Cardiology | Admitting: Cardiology

## 2018-01-11 DIAGNOSIS — Z955 Presence of coronary angioplasty implant and graft: Secondary | ICD-10-CM

## 2018-01-11 DIAGNOSIS — Z7902 Long term (current) use of antithrombotics/antiplatelets: Secondary | ICD-10-CM | POA: Diagnosis not present

## 2018-01-11 DIAGNOSIS — I2102 ST elevation (STEMI) myocardial infarction involving left anterior descending coronary artery: Secondary | ICD-10-CM

## 2018-01-11 DIAGNOSIS — K760 Fatty (change of) liver, not elsewhere classified: Secondary | ICD-10-CM | POA: Diagnosis not present

## 2018-01-11 DIAGNOSIS — Z7951 Long term (current) use of inhaled steroids: Secondary | ICD-10-CM | POA: Diagnosis not present

## 2018-01-11 DIAGNOSIS — K219 Gastro-esophageal reflux disease without esophagitis: Secondary | ICD-10-CM | POA: Diagnosis not present

## 2018-01-11 DIAGNOSIS — Z96651 Presence of right artificial knee joint: Secondary | ICD-10-CM | POA: Diagnosis not present

## 2018-01-11 DIAGNOSIS — J45909 Unspecified asthma, uncomplicated: Secondary | ICD-10-CM | POA: Diagnosis not present

## 2018-01-11 DIAGNOSIS — E785 Hyperlipidemia, unspecified: Secondary | ICD-10-CM | POA: Diagnosis not present

## 2018-01-11 DIAGNOSIS — I10 Essential (primary) hypertension: Secondary | ICD-10-CM | POA: Diagnosis not present

## 2018-01-11 DIAGNOSIS — Z79899 Other long term (current) drug therapy: Secondary | ICD-10-CM | POA: Diagnosis not present

## 2018-01-11 DIAGNOSIS — Z7982 Long term (current) use of aspirin: Secondary | ICD-10-CM | POA: Diagnosis not present

## 2018-01-11 DIAGNOSIS — E559 Vitamin D deficiency, unspecified: Secondary | ICD-10-CM | POA: Diagnosis not present

## 2018-01-11 DIAGNOSIS — Z6841 Body Mass Index (BMI) 40.0 and over, adult: Secondary | ICD-10-CM | POA: Diagnosis not present

## 2018-01-11 DIAGNOSIS — I252 Old myocardial infarction: Secondary | ICD-10-CM | POA: Diagnosis not present

## 2018-01-11 DIAGNOSIS — I2510 Atherosclerotic heart disease of native coronary artery without angina pectoris: Secondary | ICD-10-CM | POA: Diagnosis not present

## 2018-01-14 ENCOUNTER — Encounter (HOSPITAL_COMMUNITY): Payer: BLUE CROSS/BLUE SHIELD

## 2018-01-14 ENCOUNTER — Encounter (HOSPITAL_COMMUNITY)
Admission: RE | Admit: 2018-01-14 | Discharge: 2018-01-14 | Disposition: A | Discharge status: Home / Self Care | Payer: BLUE CROSS/BLUE SHIELD | Source: Ambulatory Visit | Attending: Cardiology | Admitting: Cardiology

## 2018-01-14 DIAGNOSIS — J45909 Unspecified asthma, uncomplicated: Secondary | ICD-10-CM | POA: Diagnosis not present

## 2018-01-14 DIAGNOSIS — I2510 Atherosclerotic heart disease of native coronary artery without angina pectoris: Secondary | ICD-10-CM | POA: Diagnosis not present

## 2018-01-14 DIAGNOSIS — Z955 Presence of coronary angioplasty implant and graft: Secondary | ICD-10-CM | POA: Diagnosis not present

## 2018-01-14 DIAGNOSIS — Z7951 Long term (current) use of inhaled steroids: Secondary | ICD-10-CM | POA: Diagnosis not present

## 2018-01-14 DIAGNOSIS — I252 Old myocardial infarction: Secondary | ICD-10-CM | POA: Diagnosis not present

## 2018-01-14 DIAGNOSIS — K760 Fatty (change of) liver, not elsewhere classified: Secondary | ICD-10-CM | POA: Diagnosis not present

## 2018-01-14 DIAGNOSIS — Z79899 Other long term (current) drug therapy: Secondary | ICD-10-CM | POA: Diagnosis not present

## 2018-01-14 DIAGNOSIS — E785 Hyperlipidemia, unspecified: Secondary | ICD-10-CM | POA: Diagnosis not present

## 2018-01-14 DIAGNOSIS — Z96651 Presence of right artificial knee joint: Secondary | ICD-10-CM | POA: Diagnosis not present

## 2018-01-14 DIAGNOSIS — K219 Gastro-esophageal reflux disease without esophagitis: Secondary | ICD-10-CM | POA: Diagnosis not present

## 2018-01-14 DIAGNOSIS — E559 Vitamin D deficiency, unspecified: Secondary | ICD-10-CM | POA: Diagnosis not present

## 2018-01-14 DIAGNOSIS — I10 Essential (primary) hypertension: Secondary | ICD-10-CM | POA: Diagnosis not present

## 2018-01-14 DIAGNOSIS — Z6841 Body Mass Index (BMI) 40.0 and over, adult: Secondary | ICD-10-CM | POA: Diagnosis not present

## 2018-01-14 DIAGNOSIS — Z7902 Long term (current) use of antithrombotics/antiplatelets: Secondary | ICD-10-CM | POA: Diagnosis not present

## 2018-01-14 DIAGNOSIS — Z7982 Long term (current) use of aspirin: Secondary | ICD-10-CM | POA: Diagnosis not present

## 2018-01-14 DIAGNOSIS — I2102 ST elevation (STEMI) myocardial infarction involving left anterior descending coronary artery: Secondary | ICD-10-CM

## 2018-01-16 ENCOUNTER — Other Ambulatory Visit: Payer: Self-pay | Admitting: Family Medicine

## 2018-01-16 ENCOUNTER — Encounter (HOSPITAL_COMMUNITY): Payer: BLUE CROSS/BLUE SHIELD

## 2018-01-16 ENCOUNTER — Encounter (HOSPITAL_COMMUNITY)
Admission: RE | Admit: 2018-01-16 | Discharge: 2018-01-16 | Disposition: A | Discharge status: Home / Self Care | Payer: BLUE CROSS/BLUE SHIELD | Source: Ambulatory Visit | Attending: Cardiology | Admitting: Cardiology

## 2018-01-16 DIAGNOSIS — J45909 Unspecified asthma, uncomplicated: Secondary | ICD-10-CM | POA: Insufficient documentation

## 2018-01-16 DIAGNOSIS — E785 Hyperlipidemia, unspecified: Secondary | ICD-10-CM | POA: Diagnosis not present

## 2018-01-16 DIAGNOSIS — Z6841 Body Mass Index (BMI) 40.0 and over, adult: Secondary | ICD-10-CM | POA: Diagnosis not present

## 2018-01-16 DIAGNOSIS — Z7951 Long term (current) use of inhaled steroids: Secondary | ICD-10-CM | POA: Diagnosis not present

## 2018-01-16 DIAGNOSIS — K219 Gastro-esophageal reflux disease without esophagitis: Secondary | ICD-10-CM | POA: Insufficient documentation

## 2018-01-16 DIAGNOSIS — I252 Old myocardial infarction: Secondary | ICD-10-CM | POA: Insufficient documentation

## 2018-01-16 DIAGNOSIS — K760 Fatty (change of) liver, not elsewhere classified: Secondary | ICD-10-CM | POA: Diagnosis not present

## 2018-01-16 DIAGNOSIS — I2102 ST elevation (STEMI) myocardial infarction involving left anterior descending coronary artery: Secondary | ICD-10-CM

## 2018-01-16 DIAGNOSIS — Z7982 Long term (current) use of aspirin: Secondary | ICD-10-CM | POA: Diagnosis not present

## 2018-01-16 DIAGNOSIS — I10 Essential (primary) hypertension: Secondary | ICD-10-CM | POA: Insufficient documentation

## 2018-01-16 DIAGNOSIS — E559 Vitamin D deficiency, unspecified: Secondary | ICD-10-CM | POA: Diagnosis not present

## 2018-01-16 DIAGNOSIS — Z79899 Other long term (current) drug therapy: Secondary | ICD-10-CM | POA: Insufficient documentation

## 2018-01-16 DIAGNOSIS — Z96651 Presence of right artificial knee joint: Secondary | ICD-10-CM | POA: Insufficient documentation

## 2018-01-16 DIAGNOSIS — Z7902 Long term (current) use of antithrombotics/antiplatelets: Secondary | ICD-10-CM | POA: Diagnosis not present

## 2018-01-16 DIAGNOSIS — Z955 Presence of coronary angioplasty implant and graft: Secondary | ICD-10-CM | POA: Insufficient documentation

## 2018-01-16 DIAGNOSIS — I2510 Atherosclerotic heart disease of native coronary artery without angina pectoris: Secondary | ICD-10-CM | POA: Diagnosis not present

## 2018-01-16 DIAGNOSIS — Z1231 Encounter for screening mammogram for malignant neoplasm of breast: Secondary | ICD-10-CM

## 2018-01-17 ENCOUNTER — Encounter (HOSPITAL_COMMUNITY): Payer: Self-pay

## 2018-01-17 NOTE — Progress Notes (Signed)
Cardiac Individual Treatment Plan  Patient Details  Name: Alyssa Phillips MRN: [id number] Date of Birth: 03-17-1956 Referring Provider:     CARDIAC REHAB PHASE II ORIENTATION from 01/01/2018 in [facility] CARDIAC REHAB  Referring Provider  [place], Peter, MD      Initial Encounter Date:    CARDIAC REHAB PHASE II ORIENTATION from 01/01/2018 in [facility] CARDIAC REHAB  Date  01/01/18      Visit Diagnosis: 10/31/17 STEMI  10/31/17 DES LAD  Patient's Home Medications on Admission:  Current Outpatient Medications:  .  acetaminophen (TYLENOL) 325 MG tablet, Take 325 mg by mouth every 6 (six) hours as needed., Disp: , Rfl:  .  albuterol (PROVENTIL HFA;VENTOLIN HFA) 108 (90 Base) MCG/ACT inhaler, Inhale 2 puffs into the lungs every 6 (six) hours as needed for wheezing or shortness of breath., Disp: , Rfl:  .  aspirin EC 81 MG EC tablet, Take 1 tablet (81 mg total) by mouth daily., Disp: , Rfl:  .  atorvastatin (LIPITOR) 80 MG tablet, Take 1 tablet (80 mg total) by mouth daily at 6 PM., Disp: 30 tablet, Rfl: 5 .  benzonatate (TESSALON) 100 MG capsule, benzonatate 100 mg capsule, Disp: , Rfl:  .  Bepotastine Besilate (BEPREVE) 1.5 % SOLN, Place 1 drop into both eyes daily as needed. For itchy eyes, Disp: , Rfl:  .  budesonide-formoterol (SYMBICORT) 160-4.5 MCG/ACT inhaler, Inhale 2 puffs into the lungs 2 (two) times daily., Disp: , Rfl:  .  cholecalciferol (VITAMIN D) 1000 units tablet, Take 1,000 Units by mouth 2 (two) times daily., Disp: , Rfl:  .  diclofenac sodium (VOLTAREN) 1 % GEL, diclofenac 1 % topical gel, Disp: , Rfl:  .  levalbuterol (XOPENEX HFA) 45 MCG/ACT inhaler, Inhale 2 puffs into the lungs every 4 (four) hours as needed for wheezing., Disp: 1 Inhaler, Rfl: 0 .  levalbuterol (XOPENEX) 0.63 MG/3ML nebulizer solution, Take 0.63 mg by nebulization every 4 (four) hours as needed for wheezing or shortness of breath., Disp: , Rfl:  .   levocetirizine (XYZAL) 5 MG tablet, Take 5 mg by mouth every evening., Disp: , Rfl:  .  lisinopril (PRINIVIL,ZESTRIL) 5 MG tablet, Take 1 tablet (5 mg total) by mouth daily., Disp: 30 tablet, Rfl: 5 .  metoprolol succinate (TOPROL-XL) 50 MG 24 hr tablet, Take 1 tablet (50 mg total) by mouth daily. Take with or immediately following a meal., Disp: 30 tablet, Rfl: 5 .  mometasone (NASONEX) 50 MCG/ACT nasal spray, Place 2 sprays into the nose every morning., Disp: , Rfl:  .  montelukast (SINGULAIR) 10 MG tablet, Take 10 mg by mouth at bedtime., Disp: , Rfl:  .  Multiple Vitamin (MULTIVITAMIN) tablet, Take 1 tablet by mouth daily., Disp: , Rfl:  .  nitroGLYCERIN (NITROSTAT) 0.4 MG SL tablet, Place 1 tablet (0.4 mg total) under the tongue every 5 (five) minutes as needed for chest pain., Disp: 25 tablet, Rfl: 2 .  ticagrelor (BRILINTA) 90 MG TABS tablet, Take 1 tablet (90 mg total) by mouth 2 (two) times daily., Disp: 180 tablet, Rfl: 3  Past Medical History: Past Medical History:  Diagnosis Date  . Allergic rhinitis   . Arthritis    Osteoarthrits-knees-hx. RTKA  . Asthma    environmental agents induced asthma  . Coronary artery disease   . Family history of adverse reaction to anesthesia    daughter very sensitive to anesthesia  . GERD (gastroesophageal reflux disease)   . Heart murmur  hx. mitral valve proplapse-mostly asymptomatic-->not apprectiaed n echo  . Hepatic steatosis 03/05/2008   [place]  . Hyperlipidemia   . Hypertension   . Morbid obesity due to excess calories (HCC)   . STEMI (ST elevation myocardial infarction) (HCC) 10/2017   LAD treated with DESx1  . UTI (urinary tract infection)   . Vitamin D deficiency   . Vitamin D deficiency     Tobacco Use: Social History   Tobacco Use  Smoking Status Never Smoker  Smokeless Tobacco Never Used    Labs: Recent Review Flowsheet Data    Labs for ITP Cardiac and Pulmonary Rehab Latest Ref Rng & Units 04/07/2016 10/31/2016  11/01/2017 12/04/2017 01/02/2018   Cholestrol 100 - 199 mg/dL 161(W) - 960(A) - 540   LDLCALC 0 - 99 mg/dL 981 191 478(G) - 44   HDL >39 mg/dL 44 47 49 - 95(A)   Trlycerides 0 - 149 mg/dL 213 086 578 - 469   Hemoglobin A1c 4.6 - 6.5 % - - - 5.6 -   TCO2 22 - 32 mmol/L - - 22 - -      Capillary Blood Glucose: No results found for: GLUCAP   Exercise Target Goals: Exercise Program Goal: Individual exercise prescription set using results from initial 6 min walk test and THRR while considering  patient's activity barriers and safety.   Exercise Prescription Goal: Initial exercise prescription builds to 30-45 minutes a day of aerobic activity, 2-3 days per week.  Home exercise guidelines will be given to patient during program as part of exercise prescription that the participant will acknowledge.  Activity Barriers & Risk Stratification: Activity Barriers & Cardiac Risk Stratification - 01/01/18 0816      Activity Barriers & Cardiac Risk Stratification   Activity Barriers  Left Knee Replacement;Right Knee Replacement;Arthritis;Other (comment)    Comments  Bursitis left shoulder, arthritis top of left foot, bilateral heel spurs    Cardiac Risk Stratification  High       6 Minute Walk: 6 Minute Walk    Row Name 01/01/18 0829         6 Minute Walk   Phase  Initial     Distance  1204 feet     Walk Time  6 minutes     # of Rest Breaks  0     MPH  2.28     METS  2.24     RPE  11     VO2 Peak  7.84     Symptoms  Yes (comment)     Comments  Patient c/o feeling "breathy" after walk test.     Resting HR  100 bpm     Resting BP  98/60     Resting Oxygen Saturation   96 %     Exercise Oxygen Saturation  during 6 min walk  97 %     Max Ex. HR  131 bpm     Max Ex. BP  124/80     2 Minute Post BP  104/60        Oxygen Initial Assessment:   Oxygen Re-Evaluation:   Oxygen Discharge (Final Oxygen Re-Evaluation):   Initial Exercise Prescription: Initial Exercise Prescription  - 01/01/18 1100      Date of Initial Exercise RX and Referring Provider   Date  01/01/18    Referring Provider  [place], Peter, MD      NuStep   Level  3    SPM  85    Minutes  10    METs  2.5      Arm Ergometer   Level  2    Minutes  10    METs  2.5      Track   Laps  10    Minutes  10    METs  2.74      Prescription Details   Frequency (times per week)  3    Duration  Progress to 30 minutes of continuous aerobic without signs/symptoms of physical distress      Intensity   THRR 40-80% of Max Heartrate  63-126    Ratings of Perceived Exertion  11-13    Perceived Dyspnea  0-4      Progression   Progression  Continue to progress workloads to maintain intensity without signs/symptoms of physical distress.      Resistance Training   Training Prescription  Yes    Weight  3lbs    Reps  10-15       Perform Capillary Blood Glucose checks as needed.  Exercise Prescription Changes: Exercise Prescription Changes    Row Name 01/07/18 1600 01/16/18 1600           Response to Exercise   Blood Pressure (Admit)  100/60  102/76      Blood Pressure (Exercise)  114/70  108/76      Blood Pressure (Exit)  104/60  92/60      Heart Rate (Admit)  99 bpm  103 bpm      Heart Rate (Exercise)  129 bpm  129 bpm      Heart Rate (Exit)  97 bpm  90 bpm      Rating of Perceived Exertion (Exercise)  11  11      Perceived Dyspnea (Exercise)  0  0      Symptoms  None  None      Comments  Pt's first day of exercise   -      Duration  Progress to 30 minutes of  aerobic without signs/symptoms of physical distress  Continue with 30 min of aerobic exercise without signs/symptoms of physical distress.      Intensity  THRR unchanged  THRR unchanged        Progression   Progression  Continue to progress workloads to maintain intensity without signs/symptoms of physical distress.  Continue to progress workloads to maintain intensity without signs/symptoms of physical distress.      Average METs   2.3  3.13        Resistance Training   Training Prescription  Yes  No      Weight  3lbs  -      Reps  10-15  -      Time  10 Minutes  -        Interval Training   Interval Training  No  No        NuStep   Level  3  3      SPM  85  100      Minutes  10  10      METs  1.6  4.2        Arm Ergometer   Level  2  2      Minutes  10  10      METs  2.7  2.2        Track   Laps  11  13      Minutes  10  10  METs  2.91  3.3         Exercise Comments: Exercise Comments    Row Name 01/07/18 1622 01/07/18 1626         Exercise Comments  Today, was pt's first day of exercise. Pt responded well to exercise workloads. Will continue to monitor and progress pt as tolerated.   Today, was pt's first day of exercise. Pt responded well to exercise workloads. Will continue to monitor and progress pt as tolerated.          Exercise Goals and Review: Exercise Goals    Row Name 01/01/18 0820             Exercise Goals   Increase Physical Activity  Yes       Intervention  Provide advice, education, support and counseling about physical activity/exercise needs.;Develop an individualized exercise prescription for aerobic and resistive training based on initial evaluation findings, risk stratification, comorbidities and participant's personal goals.       Expected Outcomes  Short Term: Attend rehab on a regular basis to increase amount of physical activity.;Long Term: Exercising regularly at least 3-5 days a week.;Long Term: Add in home exercise to make exercise part of routine and to increase amount of physical activity.       Increase Strength and Stamina  Yes       Intervention  Provide advice, education, support and counseling about physical activity/exercise needs.;Develop an individualized exercise prescription for aerobic and resistive training based on initial evaluation findings, risk stratification, comorbidities and participant's personal goals.       Expected Outcomes  Short  Term: Increase workloads from initial exercise prescription for resistance, speed, and METs.;Short Term: Perform resistance training exercises routinely during rehab and add in resistance training at home;Long Term: Improve cardiorespiratory fitness, muscular endurance and strength as measured by increased METs and functional capacity ( )       Able to understand and use rate of perceived exertion (RPE) scale  Yes       Intervention  Provide education and explanation on how to use RPE scale       Expected Outcomes  Short Term: Able to use RPE daily in rehab to express subjective intensity level;Long Term:  Able to use RPE to guide intensity level when exercising independently       Knowledge and understanding of Target Heart Rate Range (THRR)  Yes       Intervention  Provide education and explanation of THRR including how the numbers were predicted and where they are located for reference       Expected Outcomes  Short Term: Able to state/look up THRR;Long Term: Able to use THRR to govern intensity when exercising independently;Short Term: Able to use daily as guideline for intensity in rehab       Able to check pulse independently  Yes       Intervention  Provide education and demonstration on how to check pulse in carotid and radial arteries.;Review the importance of being able to check your own pulse for safety during independent exercise       Expected Outcomes  Short Term: Able to explain why pulse checking is important during independent exercise;Long Term: Able to check pulse independently and accurately       Understanding of Exercise Prescription  Yes       Intervention  Provide education, explanation, and written materials on patient's individual exercise prescription       Expected Outcomes  Short Term: Able to  explain program exercise prescription;Long Term: Able to explain home exercise prescription to exercise independently          Exercise Goals Re-Evaluation :   Discharge  Exercise Prescription (Final Exercise Prescription Changes): Exercise Prescription Changes - 01/16/18 1600      Response to Exercise   Blood Pressure (Admit)  102/76    Blood Pressure (Exercise)  108/76    Blood Pressure (Exit)  92/60    Heart Rate (Admit)  103 bpm    Heart Rate (Exercise)  129 bpm    Heart Rate (Exit)  90 bpm    Rating of Perceived Exertion (Exercise)  11    Perceived Dyspnea (Exercise)  0    Symptoms  None    Duration  Continue with 30 min of aerobic exercise without signs/symptoms of physical distress.    Intensity  THRR unchanged      Progression   Progression  Continue to progress workloads to maintain intensity without signs/symptoms of physical distress.    Average METs  3.13      Resistance Training   Training Prescription  No      Interval Training   Interval Training  No      NuStep   Level  3    SPM  100    Minutes  10    METs  4.2      Arm Ergometer   Level  2    Minutes  10    METs  2.2      Track   Laps  13    Minutes  10    METs  3.3       Nutrition:  Target Goals: Understanding of nutrition guidelines, daily intake of sodium 1500mg , cholesterol 200mg , calories 30% from fat and 7% or less from saturated fats, daily to have 5 or more servings of fruits and vegetables.  Biometrics: Pre Biometrics - 01/01/18 1155      Pre Biometrics   Height  5' 8.25" (1.734 m)    Weight  (!) 141.9 kg    Waist Circumference  50.5 inches    Hip Circumference  60 inches    Waist to Hip Ratio  0.84 %    BMI (Calculated)  47.19    Triceps Skinfold  60 mm    % Body Fat  57.7 %    Grip Strength  36 kg    Flexibility  14 in    Single Leg Stand  4.18 seconds        Nutrition Therapy Plan and Nutrition Goals: Nutrition Therapy & Goals - 01/01/18 0916      Nutrition Therapy   Diet  heart healthy, carb modified      Personal Nutrition Goals   Nutrition Goal  Pt to identify and limit food sources of saturated fat, trans fat, refined  carbohydrates and sodium    Personal Goal #2  Pt to identify food quantities necessary to achieve weight loss of 6-24 lbs. at graduation from cardiac rehab.    Personal Goal #3  Pt able to name foods that affect blood glucose      Intervention Plan   Intervention  Prescribe, educate and counsel regarding individualized specific dietary modifications aiming towards targeted core components such as weight, hypertension, lipid management, diabetes, heart failure and other comorbidities.    Expected Outcomes  Short Term Goal: Understand basic principles of dietary content, such as calories, fat, sodium, cholesterol and nutrients.;Long Term Goal: Adherence to prescribed nutrition plan.  Nutrition Assessments: Nutrition Assessments - 01/01/18 0917      MEDFICTS Scores   Pre Score  12       Nutrition Goals Re-Evaluation:   Nutrition Goals Re-Evaluation:   Nutrition Goals Discharge (Final Nutrition Goals Re-Evaluation):   Psychosocial: Target Goals: Acknowledge presence or absence of significant depression and/or stress, maximize coping skills, provide positive support system. Participant is able to verbalize types and ability to use techniques and skills needed for reducing stress and depression.  Initial Review & Psychosocial Screening: Initial Psych Review & Screening - 01/01/18 1221      Initial Review   Current issues with  None Identified      Family Dynamics   Good Support System?  Yes      Barriers   Psychosocial barriers to participate in program  There are no identifiable barriers or psychosocial needs.      Screening Interventions   Interventions  Encouraged to exercise       Quality of Life Scores: Quality of Life - 01/01/18 1030      Quality of Life   Select  Quality of Life      Quality of Life Scores   Health/Function Pre  23.6 %    Socioeconomic Pre  29.25 %    Psych/Spiritual Pre  26.57 %    Family Pre  28.8 %    GLOBAL Pre  26.23 %       Scores of 19 and below usually indicate a poorer quality of life in these areas.  A difference of  2-3 points is a clinically meaningful difference.  A difference of 2-3 points in the total score of the Quality of Life Index has been associated with significant improvement in overall quality of life, self-image, physical symptoms, and general health in studies assessing change in quality of life.  PHQ-9: Recent Review Flowsheet Data    Depression screen [facility] 2/9 01/07/2018 12/04/2017   Decreased Interest 0 0   Down, Depressed, Hopeless 0 0   PHQ - 2 Score 0 0     Interpretation of Total Score  Total Score Depression Severity:  1-4 = Minimal depression, 5-9 = Mild depression, 10-14 = Moderate depression, 15-19 = Moderately severe depression, 20-27 = Severe depression   Psychosocial Evaluation and Intervention: Psychosocial Evaluation - 01/07/18 0900      Psychosocial Evaluation & Interventions   Interventions  Encouraged to exercise with the program and follow exercise prescription    Comments  No psychosocial needs identified. Pt enjoys drawing, painting, and playing with her grandchildren.     Expected Outcomes  Lateya will continue to exhibit a positive outlook.     Continue Psychosocial Services   No Follow up required       Psychosocial Re-Evaluation: Psychosocial Re-Evaluation    Row Name 01/17/18 0732             Psychosocial Re-Evaluation   Current issues with  None Identified       Comments  No interventions necessary.       Expected Outcomes  Lexxi will maintain a positive outlook.       Interventions  Encouraged to attend Cardiac Rehabilitation for the exercise       Continue Psychosocial Services   No Follow up required          Psychosocial Discharge (Final Psychosocial Re-Evaluation): Psychosocial Re-Evaluation - 01/17/18 0732      Psychosocial Re-Evaluation   Current issues with  None Identified  Comments  No interventions necessary.    Expected  Outcomes  Masayo will maintain a positive outlook.    Interventions  Encouraged to attend Cardiac Rehabilitation for the exercise    Continue Psychosocial Services   No Follow up required       Vocational Rehabilitation: Provide vocational rehab assistance to qualifying candidates.   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 Participant will state understanding/return demonstration of topics presented.  Learning Barriers/Preferences: Learning Barriers/Preferences - 01/01/18 1225      Learning Barriers/Preferences   Learning Barriers  Sight   Wears glasses   Learning Preferences  Pictoral;Skilled Demonstration       Education Topics: Count Your Pulse:  -Group instruction provided by verbal instruction, demonstration, patient participation and written materials to support subject.  Instructors address importance of being able to find your pulse and how to count your pulse when at home without a heart monitor.  Patients get hands on experience counting their pulse with staff help and individually.   Heart Attack, Angina, and Risk Factor Modification:  -Group instruction provided by verbal instruction, video, and written materials to support subject.  Instructors address signs and symptoms of angina and heart attacks.    Also discuss risk factors for heart disease and how to make changes to improve heart health risk factors.   CARDIAC REHAB PHASE II EXERCISE from 01/16/2018 in [facility] CARDIAC REHAB  Date  01/16/18  Instruction Review Code  2- Demonstrated Understanding      Functional Fitness:  -Group instruction provided by verbal instruction, demonstration, patient participation,  and written materials to support subject.  Instructors address safety measures for doing things around the house.  Discuss how to get up and down off the floor, how to pick things up properly, how to safely get out of a chair without assistance, and balance training.   Meditation and Mindfulness:  -Group instruction provided by verbal instruction, patient participation, and written materials to support subject.  Instructor addresses importance of mindfulness and meditation practice to help reduce stress and improve awareness.  Instructor also leads participants through a meditation exercise.    Stretching for Flexibility and Mobility:  -Group instruction provided by verbal instruction, patient participation, and written materials to support subject.  Instructors lead participants through series of stretches that are designed to increase flexibility thus improving mobility.  These stretches are additional exercise for major muscle groups that are typically performed during regular warm up and cool down.   Hands Only CPR:  -Group verbal, video, and participation provides a basic overview of AHA guidelines for community CPR. Role-play of emergencies allow participants the opportunity to practice calling for help and chest compression technique with discussion of AED use.   Hypertension: -Group verbal and written instruction that provides a basic overview of hypertension including the most recent diagnostic guidelines, risk factor reduction with self-care instructions and medication management.    Nutrition I class: Heart Healthy Eating:  -Group instruction provided by PowerPoint slides, verbal discussion, and written materials to support subject matter. The instructor gives an explanation and review of the Therapeutic Lifestyle Changes diet recommendations, which includes a discussion on lipid goals, dietary fat, sodium, fiber, plant stanol/sterol esters,  sugar, and the components of a  well-balanced, healthy diet.   Nutrition II class: Lifestyle Skills:  -Group instruction provided by PowerPoint slides, verbal discussion, and written materials to support subject matter. The instructor gives an explanation and review of label reading, grocery shopping for heart health, heart healthy recipe modifications, and ways to make healthier choices when eating out.   Diabetes Question & Answer:  -Group instruction provided by PowerPoint slides, verbal discussion, and written materials to support subject matter. The instructor gives an explanation and review of diabetes co-morbidities, pre- and post-prandial blood glucose goals, pre-exercise blood glucose goals, signs, symptoms, and treatment of hypoglycemia and hyperglycemia, and foot care basics.   Diabetes Blitz:  -Group instruction provided by PowerPoint slides, verbal discussion, and written materials to support subject matter. The instructor gives an explanation and review of the physiology behind type 1 and type 2 diabetes, diabetes medications and rational behind using different medications, pre- and post-prandial blood glucose recommendations and Hemoglobin A1c goals, diabetes diet, and exercise including blood glucose guidelines for exercising safely.    Portion Distortion:  -Group instruction provided by PowerPoint slides, verbal discussion, written materials, and food models to support subject matter. The instructor gives an explanation of serving size versus portion size, changes in portions sizes over the last 20 years, and what consists of a serving from each food group.   Stress Management:  -Group instruction provided by verbal instruction, video, and written materials to support subject matter.  Instructors review role of stress in heart disease and how to cope with stress positively.     Exercising on Your Own:  -Group instruction provided by verbal instruction, power point, and written materials to support subject.   Instructors discuss benefits of exercise, components of exercise, frequency and intensity of exercise, and end points for exercise.  Also discuss use of nitroglycerin and activating EMS.  Review options of places to exercise outside of rehab.  Review guidelines for sex with heart disease.   Cardiac Drugs I:  -Group instruction provided by verbal instruction and written materials to support subject.  Instructor reviews cardiac drug classes: antiplatelets, anticoagulants, beta blockers, and statins.  Instructor discusses reasons, side effects, and lifestyle considerations for each drug class.   Cardiac Drugs II:  -Group instruction provided by verbal instruction and written materials to support subject.  Instructor reviews cardiac drug classes: angiotensin converting enzyme inhibitors (ACE-I), angiotensin II receptor blockers (ARBs), nitrates, and calcium channel blockers.  Instructor discusses reasons, side effects, and lifestyle considerations for each drug class.   Anatomy and Physiology of the Circulatory System:  Group verbal and written instruction and models provide basic cardiac anatomy and physiology, with the coronary electrical and arterial systems. Review of: AMI, Angina, Valve disease, Heart Failure, Peripheral Artery Disease, Cardiac Arrhythmia, Pacemakers, and the ICD.   CARDIAC REHAB PHASE II EXERCISE from 01/09/2018 in [facility] CARDIAC REHAB  Date  01/09/18  Instruction Review Code  2- Demonstrated Understanding      Other Education:  -Group or individual verbal, written, or video instructions that support the educational goals of the cardiac rehab program.   Holiday Eating Survival Tips:  -Group instruction provided by PowerPoint slides, verbal discussion, and written materials to support subject matter. The instructor gives patients tips, tricks, and techniques to help them not only survive but enjoy the holidays despite the onslaught of food that  accompanies the holidays.   Knowledge Questionnaire Score: Knowledge Questionnaire Score - 01/01/18 1027  Knowledge Questionnaire Score   Pre Score  21/24        Core Components/Risk Factors/Patient Goals at Admission: Personal Goals and Risk Factors at Admission - 01/01/18 1228      Core Components/Risk Factors/Patient Goals on Admission    Weight Management  Obesity;Yes    Intervention  Weight Management: Develop a combined nutrition and exercise program designed to reach desired caloric intake, while maintaining appropriate intake of nutrient and fiber, sodium and fats, and appropriate energy expenditure required for the weight goal.;Weight Management: Provide education and appropriate resources to help participant work on and attain dietary goals.;Weight Management/Obesity: Establish reasonable short term and long term weight goals.;Obesity: Provide education and appropriate resources to help participant work on and attain dietary goals.    Admit Weight  312 lb 13.3 oz (141.9 kg)    Expected Outcomes  Short Term: Continue to assess and modify interventions until short term weight is achieved;Long Term: Adherence to nutrition and physical activity/exercise program aimed toward attainment of established weight goal;Weight Maintenance: Understanding of the daily nutrition guidelines, which includes 25-35% calories from fat, 7% or less cal from saturated fats, less than 200mg  cholesterol, less than 1.5gm of sodium, & 5 or more servings of fruits and vegetables daily;Weight Loss: Understanding of general recommendations for a balanced deficit meal plan, which promotes 1-2 lb weight loss per week and includes a negative energy balance of [phone] kcal/d;Understanding recommendations for meals to include 15-35% energy as protein, 25-35% energy from fat, 35-60% energy from carbohydrates, less than 200mg  of dietary cholesterol, 20-35 gm of total fiber daily;Understanding of distribution of calorie  intake throughout the day with the consumption of 4-5 meals/snacks    Hypertension  Yes    Intervention  Provide education on lifestyle modifcations including regular physical activity/exercise, weight management, moderate sodium restriction and increased consumption of fresh fruit, vegetables, and low fat dairy, alcohol moderation, and smoking cessation.;Monitor prescription use compliance.    Expected Outcomes  Short Term: Continued assessment and intervention until BP is < 140/10mm HG in hypertensive participants. < 130/41mm HG in hypertensive participants with diabetes, heart failure or chronic kidney disease.;Long Term: Maintenance of blood pressure at goal levels.    Lipids  Yes    Intervention  Provide education and support for participant on nutrition & aerobic/resistive exercise along with prescribed medications to achieve LDL 70mg , HDL >40mg .    Expected Outcomes  Short Term: Participant states understanding of desired cholesterol values and is compliant with medications prescribed. Participant is following exercise prescription and nutrition guidelines.;Long Term: Cholesterol controlled with medications as prescribed, with individualized exercise RX and with personalized nutrition plan. Value goals: LDL < 70mg , HDL > 40 mg.    Stress  Yes    Intervention  Offer individual and/or small group education and counseling on adjustment to heart disease, stress management and health-related lifestyle change. Teach and support self-help strategies.;Refer participants experiencing significant psychosocial distress to appropriate mental health specialists for further evaluation and treatment. When possible, include family members and significant others in education/counseling sessions.    Expected Outcomes  Short Term: Participant demonstrates changes in health-related behavior, relaxation and other stress management skills, ability to obtain effective social support, and compliance with psychotropic  medications if prescribed.;Long Term: Emotional wellbeing is indicated by absence of clinically significant psychosocial distress or social isolation.       Core Components/Risk Factors/Patient Goals Review:  Goals and Risk Factor Review    Row Name 01/07/18 0901 01/17/18 1610  Core Components/Risk Factors/Patient Goals Review   Personal Goals Review  Weight Management/Obesity;Lipids;Hypertension;Stress  Weight Management/Obesity;Lipids;Hypertension;Stress      Review  Pt with multiple CAD RFs willing to participate in CR exercise.  Pt would like to lose weight and get stronger.   Pt with multiple CAD RFs willing to participate in CR exercise.  Mtizi is off to a great start. She is tolerating exercise well.       Expected Outcomes  Pt will continue to participate in CR exercise, nutrition, and lifestyle modification opportunities.   Pt will continue to participate in CR exercise, nutrition, and lifestyle modification opportunities.          Core Components/Risk Factors/Patient Goals at Discharge (Final Review):  Goals and Risk Factor Review - 01/17/18 0734      Core Components/Risk Factors/Patient Goals Review   Personal Goals Review  Weight Management/Obesity;Lipids;Hypertension;Stress    Review  Pt with multiple CAD RFs willing to participate in CR exercise.  Mtizi is off to a great start. She is tolerating exercise well.     Expected Outcomes  Pt will continue to participate in CR exercise, nutrition, and lifestyle modification opportunities.        ITP Comments: ITP Comments    Row Name 01/01/18 0744 01/07/18 0858 01/17/18 0730       ITP Comments  Medical Director- Dr. Armanda Magic, MD  Pt started exercise today and tolerated it well.   30 Day ITP Review.  Kaylor is off to a great start with exercise. VSS.        Comments: See ITP Comments.

## 2018-01-18 ENCOUNTER — Encounter (HOSPITAL_COMMUNITY)
Admission: RE | Admit: 2018-01-18 | Discharge: 2018-01-18 | Disposition: A | Discharge status: Home / Self Care | Payer: BLUE CROSS/BLUE SHIELD | Source: Ambulatory Visit | Attending: Cardiology | Admitting: Cardiology

## 2018-01-18 ENCOUNTER — Encounter (HOSPITAL_COMMUNITY): Payer: BLUE CROSS/BLUE SHIELD

## 2018-01-18 DIAGNOSIS — Z955 Presence of coronary angioplasty implant and graft: Secondary | ICD-10-CM

## 2018-01-18 DIAGNOSIS — J45909 Unspecified asthma, uncomplicated: Secondary | ICD-10-CM | POA: Diagnosis not present

## 2018-01-18 DIAGNOSIS — Z6841 Body Mass Index (BMI) 40.0 and over, adult: Secondary | ICD-10-CM | POA: Diagnosis not present

## 2018-01-18 DIAGNOSIS — I2102 ST elevation (STEMI) myocardial infarction involving left anterior descending coronary artery: Secondary | ICD-10-CM

## 2018-01-18 DIAGNOSIS — Z96651 Presence of right artificial knee joint: Secondary | ICD-10-CM | POA: Diagnosis not present

## 2018-01-18 DIAGNOSIS — E559 Vitamin D deficiency, unspecified: Secondary | ICD-10-CM | POA: Diagnosis not present

## 2018-01-18 DIAGNOSIS — K219 Gastro-esophageal reflux disease without esophagitis: Secondary | ICD-10-CM | POA: Diagnosis not present

## 2018-01-18 DIAGNOSIS — K760 Fatty (change of) liver, not elsewhere classified: Secondary | ICD-10-CM | POA: Diagnosis not present

## 2018-01-18 DIAGNOSIS — I2510 Atherosclerotic heart disease of native coronary artery without angina pectoris: Secondary | ICD-10-CM | POA: Diagnosis not present

## 2018-01-18 DIAGNOSIS — I10 Essential (primary) hypertension: Secondary | ICD-10-CM | POA: Diagnosis not present

## 2018-01-18 DIAGNOSIS — Z7902 Long term (current) use of antithrombotics/antiplatelets: Secondary | ICD-10-CM | POA: Diagnosis not present

## 2018-01-18 DIAGNOSIS — Z7982 Long term (current) use of aspirin: Secondary | ICD-10-CM | POA: Diagnosis not present

## 2018-01-18 DIAGNOSIS — I252 Old myocardial infarction: Secondary | ICD-10-CM | POA: Diagnosis not present

## 2018-01-18 DIAGNOSIS — E785 Hyperlipidemia, unspecified: Secondary | ICD-10-CM | POA: Diagnosis not present

## 2018-01-18 DIAGNOSIS — Z7951 Long term (current) use of inhaled steroids: Secondary | ICD-10-CM | POA: Diagnosis not present

## 2018-01-18 DIAGNOSIS — Z79899 Other long term (current) drug therapy: Secondary | ICD-10-CM | POA: Diagnosis not present

## 2018-01-21 ENCOUNTER — Encounter (HOSPITAL_COMMUNITY): Payer: BLUE CROSS/BLUE SHIELD

## 2018-01-21 ENCOUNTER — Encounter (HOSPITAL_COMMUNITY)
Admission: RE | Admit: 2018-01-21 | Discharge: 2018-01-21 | Disposition: A | Discharge status: Home / Self Care | Payer: BLUE CROSS/BLUE SHIELD | Source: Ambulatory Visit | Attending: Cardiology | Admitting: Cardiology

## 2018-01-21 DIAGNOSIS — J45909 Unspecified asthma, uncomplicated: Secondary | ICD-10-CM | POA: Diagnosis not present

## 2018-01-21 DIAGNOSIS — Z79899 Other long term (current) drug therapy: Secondary | ICD-10-CM | POA: Diagnosis not present

## 2018-01-21 DIAGNOSIS — I252 Old myocardial infarction: Secondary | ICD-10-CM | POA: Diagnosis not present

## 2018-01-21 DIAGNOSIS — Z7902 Long term (current) use of antithrombotics/antiplatelets: Secondary | ICD-10-CM | POA: Diagnosis not present

## 2018-01-21 DIAGNOSIS — Z7982 Long term (current) use of aspirin: Secondary | ICD-10-CM | POA: Diagnosis not present

## 2018-01-21 DIAGNOSIS — E785 Hyperlipidemia, unspecified: Secondary | ICD-10-CM | POA: Diagnosis not present

## 2018-01-21 DIAGNOSIS — I10 Essential (primary) hypertension: Secondary | ICD-10-CM | POA: Diagnosis not present

## 2018-01-21 DIAGNOSIS — I2510 Atherosclerotic heart disease of native coronary artery without angina pectoris: Secondary | ICD-10-CM | POA: Diagnosis not present

## 2018-01-21 DIAGNOSIS — Z955 Presence of coronary angioplasty implant and graft: Secondary | ICD-10-CM | POA: Diagnosis not present

## 2018-01-21 DIAGNOSIS — Z6841 Body Mass Index (BMI) 40.0 and over, adult: Secondary | ICD-10-CM | POA: Diagnosis not present

## 2018-01-21 DIAGNOSIS — I2102 ST elevation (STEMI) myocardial infarction involving left anterior descending coronary artery: Secondary | ICD-10-CM

## 2018-01-21 DIAGNOSIS — K219 Gastro-esophageal reflux disease without esophagitis: Secondary | ICD-10-CM | POA: Diagnosis not present

## 2018-01-21 DIAGNOSIS — E559 Vitamin D deficiency, unspecified: Secondary | ICD-10-CM | POA: Diagnosis not present

## 2018-01-21 DIAGNOSIS — Z96651 Presence of right artificial knee joint: Secondary | ICD-10-CM | POA: Diagnosis not present

## 2018-01-21 DIAGNOSIS — Z7951 Long term (current) use of inhaled steroids: Secondary | ICD-10-CM | POA: Diagnosis not present

## 2018-01-21 DIAGNOSIS — K760 Fatty (change of) liver, not elsewhere classified: Secondary | ICD-10-CM | POA: Diagnosis not present

## 2018-01-23 ENCOUNTER — Encounter (HOSPITAL_COMMUNITY)
Admission: RE | Admit: 2018-01-23 | Discharge: 2018-01-23 | Disposition: A | Discharge status: Home / Self Care | Payer: BLUE CROSS/BLUE SHIELD | Source: Ambulatory Visit | Attending: Cardiology | Admitting: Cardiology

## 2018-01-23 ENCOUNTER — Encounter (HOSPITAL_COMMUNITY): Payer: BLUE CROSS/BLUE SHIELD

## 2018-01-23 DIAGNOSIS — Z7951 Long term (current) use of inhaled steroids: Secondary | ICD-10-CM | POA: Diagnosis not present

## 2018-01-23 DIAGNOSIS — E559 Vitamin D deficiency, unspecified: Secondary | ICD-10-CM | POA: Diagnosis not present

## 2018-01-23 DIAGNOSIS — I10 Essential (primary) hypertension: Secondary | ICD-10-CM | POA: Diagnosis not present

## 2018-01-23 DIAGNOSIS — Z955 Presence of coronary angioplasty implant and graft: Secondary | ICD-10-CM | POA: Diagnosis not present

## 2018-01-23 DIAGNOSIS — I2102 ST elevation (STEMI) myocardial infarction involving left anterior descending coronary artery: Secondary | ICD-10-CM

## 2018-01-23 DIAGNOSIS — K219 Gastro-esophageal reflux disease without esophagitis: Secondary | ICD-10-CM | POA: Diagnosis not present

## 2018-01-23 DIAGNOSIS — I2510 Atherosclerotic heart disease of native coronary artery without angina pectoris: Secondary | ICD-10-CM | POA: Diagnosis not present

## 2018-01-23 DIAGNOSIS — Z79899 Other long term (current) drug therapy: Secondary | ICD-10-CM | POA: Diagnosis not present

## 2018-01-23 DIAGNOSIS — E785 Hyperlipidemia, unspecified: Secondary | ICD-10-CM | POA: Diagnosis not present

## 2018-01-23 DIAGNOSIS — Z6841 Body Mass Index (BMI) 40.0 and over, adult: Secondary | ICD-10-CM | POA: Diagnosis not present

## 2018-01-23 DIAGNOSIS — Z96651 Presence of right artificial knee joint: Secondary | ICD-10-CM | POA: Diagnosis not present

## 2018-01-23 DIAGNOSIS — J45909 Unspecified asthma, uncomplicated: Secondary | ICD-10-CM | POA: Diagnosis not present

## 2018-01-23 DIAGNOSIS — K760 Fatty (change of) liver, not elsewhere classified: Secondary | ICD-10-CM | POA: Diagnosis not present

## 2018-01-23 DIAGNOSIS — Z7982 Long term (current) use of aspirin: Secondary | ICD-10-CM | POA: Diagnosis not present

## 2018-01-23 DIAGNOSIS — Z7902 Long term (current) use of antithrombotics/antiplatelets: Secondary | ICD-10-CM | POA: Diagnosis not present

## 2018-01-23 DIAGNOSIS — I252 Old myocardial infarction: Secondary | ICD-10-CM | POA: Diagnosis not present

## 2018-01-25 ENCOUNTER — Encounter (HOSPITAL_COMMUNITY)
Admission: RE | Admit: 2018-01-25 | Discharge: 2018-01-25 | Disposition: A | Discharge status: Home / Self Care | Payer: BLUE CROSS/BLUE SHIELD | Source: Ambulatory Visit | Attending: Cardiology | Admitting: Cardiology

## 2018-01-25 ENCOUNTER — Encounter (HOSPITAL_COMMUNITY): Payer: BLUE CROSS/BLUE SHIELD

## 2018-01-25 DIAGNOSIS — K760 Fatty (change of) liver, not elsewhere classified: Secondary | ICD-10-CM | POA: Diagnosis not present

## 2018-01-25 DIAGNOSIS — Z7902 Long term (current) use of antithrombotics/antiplatelets: Secondary | ICD-10-CM | POA: Diagnosis not present

## 2018-01-25 DIAGNOSIS — I2510 Atherosclerotic heart disease of native coronary artery without angina pectoris: Secondary | ICD-10-CM | POA: Diagnosis not present

## 2018-01-25 DIAGNOSIS — Z955 Presence of coronary angioplasty implant and graft: Secondary | ICD-10-CM | POA: Diagnosis not present

## 2018-01-25 DIAGNOSIS — Z6841 Body Mass Index (BMI) 40.0 and over, adult: Secondary | ICD-10-CM | POA: Diagnosis not present

## 2018-01-25 DIAGNOSIS — Z7951 Long term (current) use of inhaled steroids: Secondary | ICD-10-CM | POA: Diagnosis not present

## 2018-01-25 DIAGNOSIS — Z96651 Presence of right artificial knee joint: Secondary | ICD-10-CM | POA: Diagnosis not present

## 2018-01-25 DIAGNOSIS — K219 Gastro-esophageal reflux disease without esophagitis: Secondary | ICD-10-CM | POA: Diagnosis not present

## 2018-01-25 DIAGNOSIS — E785 Hyperlipidemia, unspecified: Secondary | ICD-10-CM | POA: Diagnosis not present

## 2018-01-25 DIAGNOSIS — I252 Old myocardial infarction: Secondary | ICD-10-CM | POA: Diagnosis not present

## 2018-01-25 DIAGNOSIS — E559 Vitamin D deficiency, unspecified: Secondary | ICD-10-CM | POA: Diagnosis not present

## 2018-01-25 DIAGNOSIS — Z79899 Other long term (current) drug therapy: Secondary | ICD-10-CM | POA: Diagnosis not present

## 2018-01-25 DIAGNOSIS — J45909 Unspecified asthma, uncomplicated: Secondary | ICD-10-CM | POA: Diagnosis not present

## 2018-01-25 DIAGNOSIS — I10 Essential (primary) hypertension: Secondary | ICD-10-CM | POA: Diagnosis not present

## 2018-01-25 DIAGNOSIS — I2102 ST elevation (STEMI) myocardial infarction involving left anterior descending coronary artery: Secondary | ICD-10-CM

## 2018-01-25 DIAGNOSIS — Z7982 Long term (current) use of aspirin: Secondary | ICD-10-CM | POA: Diagnosis not present

## 2018-01-28 ENCOUNTER — Encounter (HOSPITAL_COMMUNITY)
Admission: RE | Admit: 2018-01-28 | Discharge: 2018-01-28 | Disposition: A | Discharge status: Home / Self Care | Payer: BLUE CROSS/BLUE SHIELD | Source: Ambulatory Visit | Attending: Cardiology | Admitting: Cardiology

## 2018-01-28 ENCOUNTER — Encounter (HOSPITAL_COMMUNITY): Payer: BLUE CROSS/BLUE SHIELD

## 2018-01-28 DIAGNOSIS — Z96651 Presence of right artificial knee joint: Secondary | ICD-10-CM | POA: Diagnosis not present

## 2018-01-28 DIAGNOSIS — J45909 Unspecified asthma, uncomplicated: Secondary | ICD-10-CM | POA: Diagnosis not present

## 2018-01-28 DIAGNOSIS — Z7902 Long term (current) use of antithrombotics/antiplatelets: Secondary | ICD-10-CM | POA: Diagnosis not present

## 2018-01-28 DIAGNOSIS — E785 Hyperlipidemia, unspecified: Secondary | ICD-10-CM | POA: Diagnosis not present

## 2018-01-28 DIAGNOSIS — Z7982 Long term (current) use of aspirin: Secondary | ICD-10-CM | POA: Diagnosis not present

## 2018-01-28 DIAGNOSIS — I2102 ST elevation (STEMI) myocardial infarction involving left anterior descending coronary artery: Secondary | ICD-10-CM

## 2018-01-28 DIAGNOSIS — Z955 Presence of coronary angioplasty implant and graft: Secondary | ICD-10-CM | POA: Diagnosis not present

## 2018-01-28 DIAGNOSIS — K219 Gastro-esophageal reflux disease without esophagitis: Secondary | ICD-10-CM | POA: Diagnosis not present

## 2018-01-28 DIAGNOSIS — Z7951 Long term (current) use of inhaled steroids: Secondary | ICD-10-CM | POA: Diagnosis not present

## 2018-01-28 DIAGNOSIS — K760 Fatty (change of) liver, not elsewhere classified: Secondary | ICD-10-CM | POA: Diagnosis not present

## 2018-01-28 DIAGNOSIS — I252 Old myocardial infarction: Secondary | ICD-10-CM | POA: Diagnosis not present

## 2018-01-28 DIAGNOSIS — Z79899 Other long term (current) drug therapy: Secondary | ICD-10-CM | POA: Diagnosis not present

## 2018-01-28 DIAGNOSIS — Z6841 Body Mass Index (BMI) 40.0 and over, adult: Secondary | ICD-10-CM | POA: Diagnosis not present

## 2018-01-28 DIAGNOSIS — I10 Essential (primary) hypertension: Secondary | ICD-10-CM | POA: Diagnosis not present

## 2018-01-28 DIAGNOSIS — I2510 Atherosclerotic heart disease of native coronary artery without angina pectoris: Secondary | ICD-10-CM | POA: Diagnosis not present

## 2018-01-28 DIAGNOSIS — E559 Vitamin D deficiency, unspecified: Secondary | ICD-10-CM | POA: Diagnosis not present

## 2018-01-28 NOTE — Progress Notes (Signed)
Alyssa Phillips 62 y.o. female Nutrition Note Spoke with pt. Nutrition Plan and Nutrition Survey goals reviewed with pt. Pt is following a Heart Healthy diet. Pt would still like to lose 30-40 lbs. Is motivated by previous medical provider sharing that a small 10% weight loss may help change blood lipids, blood glucose, and blood pressure. Pt continues to maintain several lifestyle changes with nutrition (consumes lean protein, sodium intake to 1500mg  daily, eats frequently across the day, has started to weigh and measure portions since last visit). Praised pt for these changes. Additional wt loss tips reviewed (label reading, how to build a healthy plate, portion sizes, eating frequently across the day). Per discussion, pt does not use canned/convenience foods often. Pt rarely adds salt to food. Pt eats out infrequently. Pts last A1C 5.6, cusp of pre-diabetes. Reviewed the differences between refined carbohydrates and complex carbohydrates. Recommended replacing refined carbohydrates with complex carbohydrates, pt agreed to try RD recommendations. Pt expressed understanding of the information reviewed. Pt aware of nutrition education classes offered and plans on attending nutrition classes.  Lab Results  Component Value Date   HGBA1C 5.6 12/04/2017    Wt Readings from Last 3 Encounters:  01/01/18 (!) 312 lb 13.3 oz (141.9 kg)  12/21/17 (!) 318 lb 9.6 oz (144.5 kg)  12/04/17 (!) 318 lb (144.2 kg)    Nutrition Diagnosis  Obesity related to excessive energy intake as evidenced by a BMI 48.8 kg/m2  Nutrition Intervention ? Pt's individual nutrition plan reviewed with pt. ? Benefits of continuing to maintain a Heart Healthy diet discussed when Medficts reviewed.    Goal(s)  Pt to identify and limit food sources of saturated fat, trans fat, refined carbohydrates and sodium  Pt to identify food quantities necessary to achieve weight loss of 6-24 lbs. at graduation from cardiac rehab. Pt would  like to lose 30-40 lbs overall.    Pt able to name foods that affect blood glucose   Plan:   Pt to attend nutrition classes ? Nutrition I ? Nutrition II ? Portion Distortion   Will provide client-centered nutrition education as part of interdisciplinary care  Monitor and evaluate progress toward nutrition goal with team.    Ross Marcus, MS, RD, LDN 01/28/2018 8:14 AM

## 2018-01-29 DIAGNOSIS — H1045 Other chronic allergic conjunctivitis: Secondary | ICD-10-CM | POA: Diagnosis not present

## 2018-01-29 DIAGNOSIS — J300 Vasomotor rhinitis: Secondary | ICD-10-CM | POA: Diagnosis not present

## 2018-01-29 DIAGNOSIS — J4530 Mild persistent asthma, uncomplicated: Secondary | ICD-10-CM | POA: Diagnosis not present

## 2018-01-30 ENCOUNTER — Encounter (HOSPITAL_COMMUNITY): Payer: BLUE CROSS/BLUE SHIELD

## 2018-01-30 ENCOUNTER — Encounter (HOSPITAL_COMMUNITY)
Admission: RE | Admit: 2018-01-30 | Discharge: 2018-01-30 | Disposition: A | Discharge status: Home / Self Care | Payer: BLUE CROSS/BLUE SHIELD | Source: Ambulatory Visit | Attending: Cardiology | Admitting: Cardiology

## 2018-01-30 DIAGNOSIS — E559 Vitamin D deficiency, unspecified: Secondary | ICD-10-CM | POA: Diagnosis not present

## 2018-01-30 DIAGNOSIS — I252 Old myocardial infarction: Secondary | ICD-10-CM | POA: Diagnosis not present

## 2018-01-30 DIAGNOSIS — Z6841 Body Mass Index (BMI) 40.0 and over, adult: Secondary | ICD-10-CM | POA: Diagnosis not present

## 2018-01-30 DIAGNOSIS — K760 Fatty (change of) liver, not elsewhere classified: Secondary | ICD-10-CM | POA: Diagnosis not present

## 2018-01-30 DIAGNOSIS — Z7951 Long term (current) use of inhaled steroids: Secondary | ICD-10-CM | POA: Diagnosis not present

## 2018-01-30 DIAGNOSIS — J45909 Unspecified asthma, uncomplicated: Secondary | ICD-10-CM | POA: Diagnosis not present

## 2018-01-30 DIAGNOSIS — I10 Essential (primary) hypertension: Secondary | ICD-10-CM | POA: Diagnosis not present

## 2018-01-30 DIAGNOSIS — Z96651 Presence of right artificial knee joint: Secondary | ICD-10-CM | POA: Diagnosis not present

## 2018-01-30 DIAGNOSIS — I2102 ST elevation (STEMI) myocardial infarction involving left anterior descending coronary artery: Secondary | ICD-10-CM

## 2018-01-30 DIAGNOSIS — E785 Hyperlipidemia, unspecified: Secondary | ICD-10-CM | POA: Diagnosis not present

## 2018-01-30 DIAGNOSIS — K219 Gastro-esophageal reflux disease without esophagitis: Secondary | ICD-10-CM | POA: Diagnosis not present

## 2018-01-30 DIAGNOSIS — Z7982 Long term (current) use of aspirin: Secondary | ICD-10-CM | POA: Diagnosis not present

## 2018-01-30 DIAGNOSIS — I2510 Atherosclerotic heart disease of native coronary artery without angina pectoris: Secondary | ICD-10-CM | POA: Diagnosis not present

## 2018-01-30 DIAGNOSIS — Z79899 Other long term (current) drug therapy: Secondary | ICD-10-CM | POA: Diagnosis not present

## 2018-01-30 DIAGNOSIS — Z7902 Long term (current) use of antithrombotics/antiplatelets: Secondary | ICD-10-CM | POA: Diagnosis not present

## 2018-01-30 DIAGNOSIS — Z955 Presence of coronary angioplasty implant and graft: Secondary | ICD-10-CM | POA: Diagnosis not present

## 2018-01-30 NOTE — Progress Notes (Signed)
I have reviewed a Home Exercise Prescription with Demetric B Sosnowski . Alyssa Phillips is currently exercising at home. The patient was advised to continue to walk 2-3 days a week for 15 minutes.  Niki and I discussed how to progress their exercise prescription. Pt will gradually add a minute till she reaches a total of 30 minutes. The patient stated that they understand the exercise prescription. We reviewed exercise guidelines, target heart rate during exercise, RPE Scale, weather conditions, NTG use, endpoints for exercise, warmup and cool down. Patient is encouraged to come to me with any questions. I will continue to follow up with the patient to assist them with progression and safety.    York Cerise MS, ACSM CEP 2:14 PM 01/30/2018

## 2018-02-01 ENCOUNTER — Encounter (HOSPITAL_COMMUNITY)
Admission: RE | Admit: 2018-02-01 | Discharge: 2018-02-01 | Disposition: A | Discharge status: Home / Self Care | Payer: BLUE CROSS/BLUE SHIELD | Source: Ambulatory Visit | Attending: Cardiology | Admitting: Cardiology

## 2018-02-01 ENCOUNTER — Encounter (HOSPITAL_COMMUNITY): Payer: BLUE CROSS/BLUE SHIELD

## 2018-02-01 DIAGNOSIS — E785 Hyperlipidemia, unspecified: Secondary | ICD-10-CM | POA: Diagnosis not present

## 2018-02-01 DIAGNOSIS — Z955 Presence of coronary angioplasty implant and graft: Secondary | ICD-10-CM

## 2018-02-01 DIAGNOSIS — Z96651 Presence of right artificial knee joint: Secondary | ICD-10-CM | POA: Diagnosis not present

## 2018-02-01 DIAGNOSIS — Z6841 Body Mass Index (BMI) 40.0 and over, adult: Secondary | ICD-10-CM | POA: Diagnosis not present

## 2018-02-01 DIAGNOSIS — Z7982 Long term (current) use of aspirin: Secondary | ICD-10-CM | POA: Diagnosis not present

## 2018-02-01 DIAGNOSIS — Z79899 Other long term (current) drug therapy: Secondary | ICD-10-CM | POA: Diagnosis not present

## 2018-02-01 DIAGNOSIS — Z7951 Long term (current) use of inhaled steroids: Secondary | ICD-10-CM | POA: Diagnosis not present

## 2018-02-01 DIAGNOSIS — I2102 ST elevation (STEMI) myocardial infarction involving left anterior descending coronary artery: Secondary | ICD-10-CM

## 2018-02-01 DIAGNOSIS — I2510 Atherosclerotic heart disease of native coronary artery without angina pectoris: Secondary | ICD-10-CM | POA: Diagnosis not present

## 2018-02-01 DIAGNOSIS — K760 Fatty (change of) liver, not elsewhere classified: Secondary | ICD-10-CM | POA: Diagnosis not present

## 2018-02-01 DIAGNOSIS — I252 Old myocardial infarction: Secondary | ICD-10-CM | POA: Diagnosis not present

## 2018-02-01 DIAGNOSIS — Z7902 Long term (current) use of antithrombotics/antiplatelets: Secondary | ICD-10-CM | POA: Diagnosis not present

## 2018-02-01 DIAGNOSIS — I10 Essential (primary) hypertension: Secondary | ICD-10-CM | POA: Diagnosis not present

## 2018-02-01 DIAGNOSIS — K219 Gastro-esophageal reflux disease without esophagitis: Secondary | ICD-10-CM | POA: Diagnosis not present

## 2018-02-01 DIAGNOSIS — J45909 Unspecified asthma, uncomplicated: Secondary | ICD-10-CM | POA: Diagnosis not present

## 2018-02-01 DIAGNOSIS — E559 Vitamin D deficiency, unspecified: Secondary | ICD-10-CM | POA: Diagnosis not present

## 2018-02-04 ENCOUNTER — Encounter (HOSPITAL_COMMUNITY)
Admission: RE | Admit: 2018-02-04 | Discharge: 2018-02-04 | Disposition: A | Discharge status: Home / Self Care | Payer: BLUE CROSS/BLUE SHIELD | Source: Ambulatory Visit | Attending: Cardiology | Admitting: Cardiology

## 2018-02-04 ENCOUNTER — Encounter (HOSPITAL_COMMUNITY): Payer: BLUE CROSS/BLUE SHIELD

## 2018-02-04 DIAGNOSIS — E785 Hyperlipidemia, unspecified: Secondary | ICD-10-CM | POA: Diagnosis not present

## 2018-02-04 DIAGNOSIS — K760 Fatty (change of) liver, not elsewhere classified: Secondary | ICD-10-CM | POA: Diagnosis not present

## 2018-02-04 DIAGNOSIS — Z79899 Other long term (current) drug therapy: Secondary | ICD-10-CM | POA: Diagnosis not present

## 2018-02-04 DIAGNOSIS — Z96651 Presence of right artificial knee joint: Secondary | ICD-10-CM | POA: Diagnosis not present

## 2018-02-04 DIAGNOSIS — J45909 Unspecified asthma, uncomplicated: Secondary | ICD-10-CM | POA: Diagnosis not present

## 2018-02-04 DIAGNOSIS — Z955 Presence of coronary angioplasty implant and graft: Secondary | ICD-10-CM | POA: Diagnosis not present

## 2018-02-04 DIAGNOSIS — K219 Gastro-esophageal reflux disease without esophagitis: Secondary | ICD-10-CM | POA: Diagnosis not present

## 2018-02-04 DIAGNOSIS — Z7982 Long term (current) use of aspirin: Secondary | ICD-10-CM | POA: Diagnosis not present

## 2018-02-04 DIAGNOSIS — I2510 Atherosclerotic heart disease of native coronary artery without angina pectoris: Secondary | ICD-10-CM | POA: Diagnosis not present

## 2018-02-04 DIAGNOSIS — I2102 ST elevation (STEMI) myocardial infarction involving left anterior descending coronary artery: Secondary | ICD-10-CM

## 2018-02-04 DIAGNOSIS — I252 Old myocardial infarction: Secondary | ICD-10-CM | POA: Diagnosis not present

## 2018-02-04 DIAGNOSIS — Z7902 Long term (current) use of antithrombotics/antiplatelets: Secondary | ICD-10-CM | POA: Diagnosis not present

## 2018-02-04 DIAGNOSIS — I10 Essential (primary) hypertension: Secondary | ICD-10-CM | POA: Diagnosis not present

## 2018-02-04 DIAGNOSIS — Z7951 Long term (current) use of inhaled steroids: Secondary | ICD-10-CM | POA: Diagnosis not present

## 2018-02-04 DIAGNOSIS — E559 Vitamin D deficiency, unspecified: Secondary | ICD-10-CM | POA: Diagnosis not present

## 2018-02-04 DIAGNOSIS — Z6841 Body Mass Index (BMI) 40.0 and over, adult: Secondary | ICD-10-CM | POA: Diagnosis not present

## 2018-02-06 ENCOUNTER — Encounter (HOSPITAL_COMMUNITY)
Admission: RE | Admit: 2018-02-06 | Discharge: 2018-02-06 | Disposition: A | Discharge status: Home / Self Care | Payer: BLUE CROSS/BLUE SHIELD | Source: Ambulatory Visit | Attending: Cardiology | Admitting: Cardiology

## 2018-02-06 ENCOUNTER — Encounter (HOSPITAL_COMMUNITY): Payer: BLUE CROSS/BLUE SHIELD

## 2018-02-06 DIAGNOSIS — Z6841 Body Mass Index (BMI) 40.0 and over, adult: Secondary | ICD-10-CM | POA: Diagnosis not present

## 2018-02-06 DIAGNOSIS — I10 Essential (primary) hypertension: Secondary | ICD-10-CM | POA: Diagnosis not present

## 2018-02-06 DIAGNOSIS — E559 Vitamin D deficiency, unspecified: Secondary | ICD-10-CM | POA: Diagnosis not present

## 2018-02-06 DIAGNOSIS — Z7902 Long term (current) use of antithrombotics/antiplatelets: Secondary | ICD-10-CM | POA: Diagnosis not present

## 2018-02-06 DIAGNOSIS — J45909 Unspecified asthma, uncomplicated: Secondary | ICD-10-CM | POA: Diagnosis not present

## 2018-02-06 DIAGNOSIS — K219 Gastro-esophageal reflux disease without esophagitis: Secondary | ICD-10-CM | POA: Diagnosis not present

## 2018-02-06 DIAGNOSIS — I2510 Atherosclerotic heart disease of native coronary artery without angina pectoris: Secondary | ICD-10-CM | POA: Diagnosis not present

## 2018-02-06 DIAGNOSIS — E785 Hyperlipidemia, unspecified: Secondary | ICD-10-CM | POA: Diagnosis not present

## 2018-02-06 DIAGNOSIS — I2102 ST elevation (STEMI) myocardial infarction involving left anterior descending coronary artery: Secondary | ICD-10-CM

## 2018-02-06 DIAGNOSIS — I252 Old myocardial infarction: Secondary | ICD-10-CM | POA: Diagnosis not present

## 2018-02-06 DIAGNOSIS — Z7982 Long term (current) use of aspirin: Secondary | ICD-10-CM | POA: Diagnosis not present

## 2018-02-06 DIAGNOSIS — Z7951 Long term (current) use of inhaled steroids: Secondary | ICD-10-CM | POA: Diagnosis not present

## 2018-02-06 DIAGNOSIS — K760 Fatty (change of) liver, not elsewhere classified: Secondary | ICD-10-CM | POA: Diagnosis not present

## 2018-02-06 DIAGNOSIS — Z79899 Other long term (current) drug therapy: Secondary | ICD-10-CM | POA: Diagnosis not present

## 2018-02-06 DIAGNOSIS — Z955 Presence of coronary angioplasty implant and graft: Secondary | ICD-10-CM | POA: Diagnosis not present

## 2018-02-06 DIAGNOSIS — Z96651 Presence of right artificial knee joint: Secondary | ICD-10-CM | POA: Diagnosis not present

## 2018-02-06 NOTE — Progress Notes (Signed)
Cardiology Office Note    Date:  02/08/2018   ID:  Alyssa, Phillips [birth year], MRN [id number]  PCP:  Alyssa Leatherwood, DO  Cardiologist:  Alyssa Phillips  Chief Complaint  Patient presents with  . Coronary Artery Disease    History of Present Illness:  Alyssa Phillips is a 62 y.o. female with past medical history of hypertension, HLD, asthma, reported history of mitral valve prolapse who was  admitted for anterior STEMI on 11/01/2017.  Patient was taken urgently to Cath Lab which showed severe single-vessel disease but 95% proximal LAD treated with DES x1.  EF 45 to 50% by LV gram.  Echocardiogram showed EF 40 to 45%. There was no evidence of MV prolapse.  Postprocedure, patient was placed on aspirin and Brilinta and the high-dose statin.  She was also placed on beta-blocker and ARB as well.   Apparently her son is currently finishing a fellowship program in cosmetic surgery and previously has a degree in maxillofacial surgery.  She was seen in July by Alyssa Course PA-C and was doing well. She has been participating regularly in Cardiac Rehab. She has been doing well with her diet and has lost 18 lbs. Wants to get her weight down for her son's wedding in May. Only fleeting episodes of chest pain. No dyspnea. Tolerating medication well.    Past Medical History:  Diagnosis Date  . Allergic rhinitis   . Arthritis    Osteoarthrits-knees-hx. RTKA  . Asthma    environmental agents induced asthma  . Coronary artery disease   . Family history of adverse reaction to anesthesia    daughter very sensitive to anesthesia  . GERD (gastroesophageal reflux disease)   . Heart murmur    hx. mitral valve proplapse-mostly asymptomatic-->not apprectiaed n echo  . Hepatic steatosis 03/05/2008   [place]  . Hyperlipidemia   . Hypertension   . Morbid obesity due to excess calories (HCC)   . STEMI (ST elevation myocardial infarction) (HCC) 10/2017   LAD treated with DESx1  . UTI (urinary tract infection)     . Vitamin D deficiency   . Vitamin D deficiency     Past Surgical History:  Procedure Laterality Date  . 5th finger surgery Right 1990   unknown injury  . CARDIAC CATHETERIZATION    . CHOLECYSTECTOMY    . COLONOSCOPY WITH PROPOFOL N/A 06/08/2016   Procedure: COLONOSCOPY WITH PROPOFOL;  Surgeon: Alyssa Cookey, MD;  Location: [facility] ENDOSCOPY;  Service: Endoscopy;  Laterality: N/A;  . CORONARY STENT INTERVENTION N/A 11/01/2017   Procedure: CORONARY STENT INTERVENTION;  Surgeon: Phillips, Alyssa M, MD;  Location: [facility] INVASIVE CV LAB;  Service: Cardiovascular;  Laterality: N/A;  Resolute Onyx 4.5 mm x12 mm  . CORONARY/GRAFT ACUTE MI REVASCULARIZATION N/A 11/01/2017   Procedure: Coronary/Graft Acute MI Revascularization;  Surgeon: Phillips, Alyssa M, MD;  Location: [facility] INVASIVE CV LAB;  Service: Cardiovascular;  Laterality: N/A;  . KNEE ARTHROSCOPY  05-02-12   x2 right/ x1 left  . LEFT HEART CATH AND CORONARY ANGIOGRAPHY N/A 11/01/2017   Procedure: LEFT HEART CATH AND CORONARY ANGIOGRAPHY;  Surgeon: Phillips, Alyssa M, MD;  Location: [facility] INVASIVE CV LAB;  Service: Cardiovascular;  Laterality: N/A;  . REPLACEMENT TOTAL KNEE Right 10/2010  . TOTAL KNEE ARTHROPLASTY  05/10/2012   Procedure: TOTAL KNEE ARTHROPLASTY;  Surgeon: Alyssa Drilling, MD;  Location: [facility] ORS;  Service: Orthopedics;  Laterality: Left;  . TUBAL LIGATION    . UMBILICAL HERNIA REPAIR  Current Medications: Outpatient Medications Prior to Visit  Medication Sig Dispense Refill  . acetaminophen (TYLENOL) 325 MG tablet Take 325 mg by mouth every 6 (six) hours as needed.    Marland Kitchen aspirin EC 81 MG EC tablet Take 1 tablet (81 mg total) by mouth daily.    Marland Kitchen atorvastatin (LIPITOR) 80 MG tablet Take 1 tablet (80 mg total) by mouth daily at 6 PM. 30 tablet 5  . benzonatate (TESSALON) 100 MG capsule benzonatate 100 mg capsule    . budesonide-formoterol (SYMBICORT) 160-4.5 MCG/ACT inhaler Inhale 2 puffs into the lungs 2 (two) times daily.    .  cholecalciferol (VITAMIN D) 1000 units tablet Take 1,000 Units by mouth 2 (two) times daily.    . diclofenac sodium (VOLTAREN) 1 % GEL diclofenac 1 % topical gel    . levalbuterol (XOPENEX HFA) 45 MCG/ACT inhaler Inhale 2 puffs into the lungs every 4 (four) hours as needed for wheezing. 1 Inhaler 0  . levalbuterol (XOPENEX) 0.63 MG/3ML nebulizer solution Take 0.63 mg by nebulization every 4 (four) hours as needed for wheezing or shortness of breath.    . levocetirizine (XYZAL) 5 MG tablet Take 5 mg by mouth every evening.    Marland Kitchen lisinopril (PRINIVIL,ZESTRIL) 5 MG tablet Take 1 tablet (5 mg total) by mouth daily. 30 tablet 5  . metoprolol succinate (TOPROL-XL) 50 MG 24 hr tablet Take 1 tablet (50 mg total) by mouth daily. Take with or immediately following a meal. 30 tablet 5  . mometasone (NASONEX) 50 MCG/ACT nasal spray Place 2 sprays into the nose every morning.    . montelukast (SINGULAIR) 10 MG tablet Take 10 mg by mouth at bedtime.    . Multiple Vitamin (MULTIVITAMIN) tablet Take 1 tablet by mouth daily.    . nitroGLYCERIN (NITROSTAT) 0.4 MG SL tablet Place 1 tablet (0.4 mg total) under the tongue every 5 (five) minutes as needed for chest pain. 25 tablet 2  . ticagrelor (BRILINTA) 90 MG TABS tablet Take 1 tablet (90 mg total) by mouth 2 (two) times daily. 180 tablet 3  . Bepotastine Besilate (BEPREVE) 1.5 % SOLN Place 1 drop into both eyes daily as needed. For itchy eyes    . albuterol (PROVENTIL HFA;VENTOLIN HFA) 108 (90 Base) MCG/ACT inhaler Inhale 2 puffs into the lungs every 6 (six) hours as needed for wheezing or shortness of breath.     No facility-administered medications prior to visit.      Allergies:   Advil [ibuprofen]; Aspirin; and Compazine [prochlorperazine edisylate]   Social History   Socioeconomic History  . Marital status: Married    Spouse name: Alyssa Phillips  . Number of children: Not on file  . Years of education: 47  . Highest education level: Bachelor's degree (e.g., BA,  AB, BS)  Occupational History  . Not on file  Social Needs  . Financial resource strain: Not hard at all  . Food insecurity:    Worry: Never true    Inability: Never true  . Transportation needs:    Medical: No    Non-medical: No  Tobacco Use  . Smoking status: Never Smoker  . Smokeless tobacco: Never Used  Substance and Sexual Activity  . Alcohol use: Yes    Comment: occ. rare  . Drug use: No  . Sexual activity: Yes    Partners: Male  Lifestyle  . Physical activity:    Days per week: 0 days    Minutes per session: 0 min  . Stress: Only a  little  Relationships  . Social connections:    Talks on phone: Not on file    Gets together: Not on file    Attends religious service: Not on file    Active member of club or organization: Not on file    Attends meetings of clubs or organizations: Not on file    Relationship status: Not on file  Other Topics Concern  . 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[employer], Engineer, agricultural:      -smoke alarm in the home:Yes     - wears seatbelt: Yes     - Feels safe in their relationships: Yes     Family History:  The patient's family history includes Alcohol abuse in her father; Arthritis in her father, maternal grandfather, maternal grandmother, mother, and paternal grandmother; Asthma in her mother; COPD in her father; Cancer in her mother; Diabetes in her maternal grandfather and paternal grandmother; Hearing loss in her father, maternal grandmother, mother, paternal grandfather, and paternal grandmother; Hypertension in her father, maternal grandfather, and mother; Learning disabilities in her paternal grandfather; Lymphoma in her mother; Miscarriages / Stillbirths in her mother and sister; Stroke in her maternal grandfather.   ROS:   Please see the history of present illness.    ROS All other systems reviewed and are negative.   PHYSICAL EXAM:   VS:  BP 100/60    Pulse (!) 114   Ht 5\' 8"  (1.727 Phillips)   Wt (!) 300 lb 12.8 oz (136.4 kg)   BMI 45.74 kg/Phillips    GEN: Well nourished, obesity, in no acute distress  HEENT: normal  Neck: no JVD, carotid bruits, or masses Cardiac: RRR; no murmurs, rubs, or gallops,no edema  Respiratory:  clear to auscultation bilaterally, normal work of breathing GI: soft, nontender, nondistended, + BS MS: no deformity or atrophy  Skin: warm and dry, no rash Neuro:  Alert and Oriented x 3, Strength and sensation are intact Psych: euthymic mood, full affect  Wt Readings from Last 3 Encounters:  02/08/18 (!) 300 lb 12.8 oz (136.4 kg)  01/01/18 (!) 312 lb 13.3 oz (141.9 kg)  12/21/17 (!) 318 lb 9.6 oz (144.5 kg)      Studies/Labs Reviewed:   EKG:  EKG is not ordered today.    Recent Labs: 11/01/2017: Hemoglobin 14.3; Platelets 261 11/02/2017: BUN 14; Creatinine, Ser 0.71; Potassium 4.0; Sodium 140 12/04/2017: TSH 2.11 01/02/2018: ALT 20   Lipid Panel    Component Value Date/Time   CHOL 100 01/02/2018 0805   TRIG 101 01/02/2018 0805   HDL 36 (L) 01/02/2018 0805   CHOLHDL 2.8 01/02/2018 0805   CHOLHDL 4.9 11/01/2017 0201   VLDL 25 11/01/2017 0201   LDLCALC 44 01/02/2018 0805    Additional studies/ records that were reviewed today include:   Cath: 11/01/17   Ost 1st Mrg lesion is 40% stenosed.  Prox LAD lesion is 95% stenosed.  Post intervention, there is a 0% residual stenosis.  A drug-eluting stent was successfully placed using a STENT RESOLUTE ONYX 4.5X12.  There is mild left ventricular systolic dysfunction.  LV end diastolic pressure is mildly elevated.  The left ventricular ejection fraction is 50-55% by visual estimate.  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 Mildly impaired LV function. EF 45-50% 3. Mildly elevated LVEDP 4. Successful PCI of the proximal LAD with DES x 1   Recommend uninterrupted dual antiplatelet therapy with Aspirin 81mg   daily and Ticagrelor 90mg  twice  daily for a minimum of 12 months (ACS - Class I recommendation).  2D echo 11/01/17 Study Conclusions  - Left ventricle: The cavity size was normal. There was mild focal basal hypertrophy of the septum. Systolic function was mildly to moderately reduced. The estimated ejection fraction was in the range of 40% to 45%. Wall motion abnormalities noted below. The study is indeterminate for the evaluation of LV diastolic function. Acoustic contrast opacification revealed no evidence ofthrombus. - Regional wall motion abnormality: Akinesis of the apical anterior and apical myocardium; hypokinesis of the mid anterior, mid anteroseptal, apical septal, and apical lateral myocardium. - Aortic valve: There was no regurgitation. - Mitral valve: Structurally normal valve. No echocardiographic evidence for prolapse. Transvalvular velocity was within the normal range. There was no evidence for stenosis. There was no regurgitation. - Tricuspid valve: There was no significant regurgitation. - Pulmonic valve: There was no significant regurgitation.  Impressions:  - -LV EF mild to moderately reduced with focal wall motion abnormalities. Hypokinesis of anterior and anteroseptal walls and apex. Echo contrast used to better evaluate wall motion and exclude LV thrombus. -No evidence of mitral valve prolapse or regurgitation. -Technically difficult study. Right sided structures not well visualized but appear grossly normal.      ASSESSMENT:    1. Coronary artery disease involving native coronary artery of native heart without angina pectoris   2. Essential hypertension   3. Mixed hyperlipidemia      PLAN:  In order of problems listed above:  1. CAD: s/p  anterior MI 11/01/17, underwent DES to LAD.  no other residual disease. Continue DAPT for one year. Continue  high-dose statin.  On beta blocker and ACEi.   2. Hyperlipidemia: Continue Lipitor. Recent LDL  went from  166>> 44  3. Hypertension: Blood pressure well controlled.  Continue lisinopril and Toprol-XL given the mild LV dysfunction that was noted on the recent echocardiogram  4. Asthma stable  5.   Obesity. Continue efforts at weight loss.    Medication Adjustments/Labs and Tests Ordered: Current medicines are reviewed at length with the patient today.  Concerns regarding medicines are outlined above.  Medication changes, Labs and Tests ordered today are listed in the Patient Instructions below. There are no Patient Instructions on file for this visit.   Signed, Alyssa [place], MD  02/08/2018 8:52 AM    [facility] [address] [place], [place], [place]  [address] Phone: [phone]; Fax: [phone]

## 2018-02-08 ENCOUNTER — Encounter: Payer: Self-pay | Admitting: Cardiology

## 2018-02-08 ENCOUNTER — Encounter (HOSPITAL_COMMUNITY): Payer: BLUE CROSS/BLUE SHIELD

## 2018-02-08 ENCOUNTER — Ambulatory Visit (INDEPENDENT_AMBULATORY_CARE_PROVIDER_SITE_OTHER): Payer: BLUE CROSS/BLUE SHIELD | Admitting: Cardiology

## 2018-02-08 ENCOUNTER — Encounter (HOSPITAL_COMMUNITY)
Admission: RE | Admit: 2018-02-08 | Discharge: 2018-02-08 | Disposition: A | Discharge status: Home / Self Care | Payer: BLUE CROSS/BLUE SHIELD | Source: Ambulatory Visit | Attending: Cardiology | Admitting: Cardiology

## 2018-02-08 VITALS — BP 100/60 | HR 114 | Ht 68.0 in | Wt 300.8 lb

## 2018-02-08 DIAGNOSIS — E559 Vitamin D deficiency, unspecified: Secondary | ICD-10-CM | POA: Diagnosis not present

## 2018-02-08 DIAGNOSIS — K219 Gastro-esophageal reflux disease without esophagitis: Secondary | ICD-10-CM | POA: Diagnosis not present

## 2018-02-08 DIAGNOSIS — Z955 Presence of coronary angioplasty implant and graft: Secondary | ICD-10-CM

## 2018-02-08 DIAGNOSIS — Z96651 Presence of right artificial knee joint: Secondary | ICD-10-CM | POA: Diagnosis not present

## 2018-02-08 DIAGNOSIS — Z7951 Long term (current) use of inhaled steroids: Secondary | ICD-10-CM | POA: Diagnosis not present

## 2018-02-08 DIAGNOSIS — Z7982 Long term (current) use of aspirin: Secondary | ICD-10-CM | POA: Diagnosis not present

## 2018-02-08 DIAGNOSIS — J45909 Unspecified asthma, uncomplicated: Secondary | ICD-10-CM | POA: Diagnosis not present

## 2018-02-08 DIAGNOSIS — K760 Fatty (change of) liver, not elsewhere classified: Secondary | ICD-10-CM | POA: Diagnosis not present

## 2018-02-08 DIAGNOSIS — Z79899 Other long term (current) drug therapy: Secondary | ICD-10-CM | POA: Diagnosis not present

## 2018-02-08 DIAGNOSIS — I10 Essential (primary) hypertension: Secondary | ICD-10-CM | POA: Diagnosis not present

## 2018-02-08 DIAGNOSIS — I2102 ST elevation (STEMI) myocardial infarction involving left anterior descending coronary artery: Secondary | ICD-10-CM

## 2018-02-08 DIAGNOSIS — I2510 Atherosclerotic heart disease of native coronary artery without angina pectoris: Secondary | ICD-10-CM | POA: Diagnosis not present

## 2018-02-08 DIAGNOSIS — E782 Mixed hyperlipidemia: Secondary | ICD-10-CM | POA: Diagnosis not present

## 2018-02-08 DIAGNOSIS — I252 Old myocardial infarction: Secondary | ICD-10-CM | POA: Diagnosis not present

## 2018-02-08 DIAGNOSIS — Z6841 Body Mass Index (BMI) 40.0 and over, adult: Secondary | ICD-10-CM | POA: Diagnosis not present

## 2018-02-08 DIAGNOSIS — Z7902 Long term (current) use of antithrombotics/antiplatelets: Secondary | ICD-10-CM | POA: Diagnosis not present

## 2018-02-08 DIAGNOSIS — E785 Hyperlipidemia, unspecified: Secondary | ICD-10-CM | POA: Diagnosis not present

## 2018-02-11 ENCOUNTER — Encounter (HOSPITAL_COMMUNITY)
Admission: RE | Admit: 2018-02-11 | Discharge: 2018-02-11 | Disposition: A | Discharge status: Home / Self Care | Payer: BLUE CROSS/BLUE SHIELD | Source: Ambulatory Visit | Attending: Cardiology | Admitting: Cardiology

## 2018-02-11 ENCOUNTER — Encounter (HOSPITAL_COMMUNITY): Payer: BLUE CROSS/BLUE SHIELD

## 2018-02-11 DIAGNOSIS — Z955 Presence of coronary angioplasty implant and graft: Secondary | ICD-10-CM | POA: Diagnosis not present

## 2018-02-11 DIAGNOSIS — I2102 ST elevation (STEMI) myocardial infarction involving left anterior descending coronary artery: Secondary | ICD-10-CM

## 2018-02-11 DIAGNOSIS — K760 Fatty (change of) liver, not elsewhere classified: Secondary | ICD-10-CM | POA: Diagnosis not present

## 2018-02-11 DIAGNOSIS — Z7982 Long term (current) use of aspirin: Secondary | ICD-10-CM | POA: Diagnosis not present

## 2018-02-11 DIAGNOSIS — I2510 Atherosclerotic heart disease of native coronary artery without angina pectoris: Secondary | ICD-10-CM | POA: Diagnosis not present

## 2018-02-11 DIAGNOSIS — I10 Essential (primary) hypertension: Secondary | ICD-10-CM | POA: Diagnosis not present

## 2018-02-11 DIAGNOSIS — E785 Hyperlipidemia, unspecified: Secondary | ICD-10-CM | POA: Diagnosis not present

## 2018-02-11 DIAGNOSIS — Z79899 Other long term (current) drug therapy: Secondary | ICD-10-CM | POA: Diagnosis not present

## 2018-02-11 DIAGNOSIS — Z6841 Body Mass Index (BMI) 40.0 and over, adult: Secondary | ICD-10-CM | POA: Diagnosis not present

## 2018-02-11 DIAGNOSIS — Z96651 Presence of right artificial knee joint: Secondary | ICD-10-CM | POA: Diagnosis not present

## 2018-02-11 DIAGNOSIS — Z7951 Long term (current) use of inhaled steroids: Secondary | ICD-10-CM | POA: Diagnosis not present

## 2018-02-11 DIAGNOSIS — K219 Gastro-esophageal reflux disease without esophagitis: Secondary | ICD-10-CM | POA: Diagnosis not present

## 2018-02-11 DIAGNOSIS — E559 Vitamin D deficiency, unspecified: Secondary | ICD-10-CM | POA: Diagnosis not present

## 2018-02-11 DIAGNOSIS — J45909 Unspecified asthma, uncomplicated: Secondary | ICD-10-CM | POA: Diagnosis not present

## 2018-02-11 DIAGNOSIS — Z7902 Long term (current) use of antithrombotics/antiplatelets: Secondary | ICD-10-CM | POA: Diagnosis not present

## 2018-02-11 DIAGNOSIS — I252 Old myocardial infarction: Secondary | ICD-10-CM | POA: Diagnosis not present

## 2018-02-13 ENCOUNTER — Encounter (HOSPITAL_COMMUNITY)
Admission: RE | Admit: 2018-02-13 | Discharge: 2018-02-13 | Disposition: A | Discharge status: Home / Self Care | Payer: BLUE CROSS/BLUE SHIELD | Source: Ambulatory Visit | Attending: Cardiology | Admitting: Cardiology

## 2018-02-13 ENCOUNTER — Encounter (HOSPITAL_COMMUNITY): Payer: BLUE CROSS/BLUE SHIELD

## 2018-02-13 DIAGNOSIS — K760 Fatty (change of) liver, not elsewhere classified: Secondary | ICD-10-CM | POA: Diagnosis not present

## 2018-02-13 DIAGNOSIS — Z7951 Long term (current) use of inhaled steroids: Secondary | ICD-10-CM | POA: Diagnosis not present

## 2018-02-13 DIAGNOSIS — Z96651 Presence of right artificial knee joint: Secondary | ICD-10-CM | POA: Diagnosis not present

## 2018-02-13 DIAGNOSIS — I2510 Atherosclerotic heart disease of native coronary artery without angina pectoris: Secondary | ICD-10-CM | POA: Diagnosis not present

## 2018-02-13 DIAGNOSIS — Z7982 Long term (current) use of aspirin: Secondary | ICD-10-CM | POA: Diagnosis not present

## 2018-02-13 DIAGNOSIS — I252 Old myocardial infarction: Secondary | ICD-10-CM | POA: Diagnosis not present

## 2018-02-13 DIAGNOSIS — K219 Gastro-esophageal reflux disease without esophagitis: Secondary | ICD-10-CM | POA: Diagnosis not present

## 2018-02-13 DIAGNOSIS — Z955 Presence of coronary angioplasty implant and graft: Secondary | ICD-10-CM

## 2018-02-13 DIAGNOSIS — Z6841 Body Mass Index (BMI) 40.0 and over, adult: Secondary | ICD-10-CM | POA: Diagnosis not present

## 2018-02-13 DIAGNOSIS — I10 Essential (primary) hypertension: Secondary | ICD-10-CM | POA: Diagnosis not present

## 2018-02-13 DIAGNOSIS — Z7902 Long term (current) use of antithrombotics/antiplatelets: Secondary | ICD-10-CM | POA: Diagnosis not present

## 2018-02-13 DIAGNOSIS — I2102 ST elevation (STEMI) myocardial infarction involving left anterior descending coronary artery: Secondary | ICD-10-CM

## 2018-02-13 DIAGNOSIS — E559 Vitamin D deficiency, unspecified: Secondary | ICD-10-CM | POA: Diagnosis not present

## 2018-02-13 DIAGNOSIS — E785 Hyperlipidemia, unspecified: Secondary | ICD-10-CM | POA: Diagnosis not present

## 2018-02-13 DIAGNOSIS — Z79899 Other long term (current) drug therapy: Secondary | ICD-10-CM | POA: Diagnosis not present

## 2018-02-13 DIAGNOSIS — J45909 Unspecified asthma, uncomplicated: Secondary | ICD-10-CM | POA: Diagnosis not present

## 2018-02-14 NOTE — Progress Notes (Signed)
Cardiac Individual Treatment Plan  Patient Details  Name: Alyssa Phillips MRN: [id number] Date of Birth: 05/16/1955 Referring Provider:     CARDIAC REHAB PHASE II ORIENTATION from 01/01/2018 in [facility] CARDIAC REHAB  Referring Provider  [place], Peter, MD      Initial Encounter Date:    CARDIAC REHAB PHASE II ORIENTATION from 01/01/2018 in [facility] CARDIAC REHAB  Date  01/01/18      Visit Diagnosis: 10/31/17 STEMI  10/31/17 DES LAD  Patient's Home Medications on Admission:  Current Outpatient Medications:  .  acetaminophen (TYLENOL) 325 MG tablet, Take 325 mg by mouth every 6 (six) hours as needed., Disp: , Rfl:  .  aspirin EC 81 MG EC tablet, Take 1 tablet (81 mg total) by mouth daily., Disp: , Rfl:  .  atorvastatin (LIPITOR) 80 MG tablet, Take 1 tablet (80 mg total) by mouth daily at 6 PM., Disp: 30 tablet, Rfl: 5 .  benzonatate (TESSALON) 100 MG capsule, benzonatate 100 mg capsule, Disp: , Rfl:  .  Bepotastine Besilate (BEPREVE) 1.5 % SOLN, Place 1 drop into both eyes daily as needed. For itchy eyes, Disp: , Rfl:  .  budesonide-formoterol (SYMBICORT) 160-4.5 MCG/ACT inhaler, Inhale 2 puffs into the lungs 2 (two) times daily., Disp: , Rfl:  .  cholecalciferol (VITAMIN D) 1000 units tablet, Take 1,000 Units by mouth 2 (two) times daily., Disp: , Rfl:  .  diclofenac sodium (VOLTAREN) 1 % GEL, diclofenac 1 % topical gel, Disp: , Rfl:  .  levalbuterol (XOPENEX HFA) 45 MCG/ACT inhaler, Inhale 2 puffs into the lungs every 4 (four) hours as needed for wheezing., Disp: 1 Inhaler, Rfl: 0 .  levalbuterol (XOPENEX) 0.63 MG/3ML nebulizer solution, Take 0.63 mg by nebulization every 4 (four) hours as needed for wheezing or shortness of breath., Disp: , Rfl:  .  levocetirizine (XYZAL) 5 MG tablet, Take 5 mg by mouth every evening., Disp: , Rfl:  .  lisinopril (PRINIVIL,ZESTRIL) 5 MG tablet, Take 1 tablet (5 mg total) by mouth daily., Disp: 30 tablet, Rfl:  5 .  metoprolol succinate (TOPROL-XL) 50 MG 24 hr tablet, Take 1 tablet (50 mg total) by mouth daily. Take with or immediately following a meal., Disp: 30 tablet, Rfl: 5 .  mometasone (NASONEX) 50 MCG/ACT nasal spray, Place 2 sprays into the nose every morning., Disp: , Rfl:  .  montelukast (SINGULAIR) 10 MG tablet, Take 10 mg by mouth at bedtime., Disp: , Rfl:  .  Multiple Vitamin (MULTIVITAMIN) tablet, Take 1 tablet by mouth daily., Disp: , Rfl:  .  nitroGLYCERIN (NITROSTAT) 0.4 MG SL tablet, Place 1 tablet (0.4 mg total) under the tongue every 5 (five) minutes as needed for chest pain., Disp: 25 tablet, Rfl: 2 .  ticagrelor (BRILINTA) 90 MG TABS tablet, Take 1 tablet (90 mg total) by mouth 2 (two) times daily., Disp: 180 tablet, Rfl: 3  Past Medical History: Past Medical History:  Diagnosis Date  . Allergic rhinitis   . Arthritis    Osteoarthrits-knees-hx. RTKA  . Asthma    environmental agents induced asthma  . Coronary artery disease   . Family history of adverse reaction to anesthesia    daughter very sensitive to anesthesia  . GERD (gastroesophageal reflux disease)   . Heart murmur    hx. mitral valve proplapse-mostly asymptomatic-->not apprectiaed n echo  . Hepatic steatosis 03/05/2008   [place]  . Hyperlipidemia   . Hypertension   . Morbid obesity due to  excess calories (HCC)   . STEMI (ST elevation myocardial infarction) (HCC) 10/2017   LAD treated with DESx1  . UTI (urinary tract infection)   . Vitamin D deficiency   . Vitamin D deficiency     Tobacco Use: Social History   Tobacco Use  Smoking Status Never Smoker  Smokeless Tobacco Never Used    Labs: Recent Review Flowsheet Data    Labs for ITP Cardiac and Pulmonary Rehab Latest Ref Rng & Units 04/07/2016 10/31/2016 11/01/2017 12/04/2017 01/02/2018   Cholestrol 100 - 199 mg/dL 161(W) - 960(A) - 540   LDLCALC 0 - 99 mg/dL 981 191 478(G) - 44   HDL >39 mg/dL 44 47 49 - 95(A)   Trlycerides 0 - 149 mg/dL 213 086 578 -  469   Hemoglobin A1c 4.6 - 6.5 % - - - 5.6 -   TCO2 22 - 32 mmol/L - - 22 - -      Capillary Blood Glucose: No results found for: GLUCAP   Exercise Target Goals: Exercise Program Goal: Individual exercise prescription set using results from initial 6 min walk test and THRR while considering  patient's activity barriers and safety.   Exercise Prescription Goal: Initial exercise prescription builds to 30-45 minutes a day of aerobic activity, 2-3 days per week.  Home exercise guidelines will be given to patient during program as part of exercise prescription that the participant will acknowledge.  Activity Barriers & Risk Stratification: Activity Barriers & Cardiac Risk Stratification - 01/01/18 0816      Activity Barriers & Cardiac Risk Stratification   Activity Barriers  Left Knee Replacement;Right Knee Replacement;Arthritis;Other (comment)    Comments  Bursitis left shoulder, arthritis top of left foot, bilateral heel spurs    Cardiac Risk Stratification  High       6 Minute Walk: 6 Minute Walk    Row Name 01/01/18 0829         6 Minute Walk   Phase  Initial     Distance  1204 feet     Walk Time  6 minutes     # of Rest Breaks  0     MPH  2.28     METS  2.24     RPE  11     VO2 Peak  7.84     Symptoms  Yes (comment)     Comments  Patient c/o feeling "breathy" after walk test.     Resting HR  100 bpm     Resting BP  98/60     Resting Oxygen Saturation   96 %     Exercise Oxygen Saturation  during 6 min walk  97 %     Max Ex. HR  131 bpm     Max Ex. BP  124/80     2 Minute Post BP  104/60        Oxygen Initial Assessment:   Oxygen Re-Evaluation:   Oxygen Discharge (Final Oxygen Re-Evaluation):   Initial Exercise Prescription: Initial Exercise Prescription - 01/01/18 1100      Date of Initial Exercise RX and Referring Provider   Date  01/01/18    Referring Provider  [place], Peter, MD      NuStep   Level  3    SPM  85    Minutes  10    METs  2.5       Arm Ergometer   Level  2    Minutes  10    METs  2.5      Track   Laps  10    Minutes  10    METs  2.74      Prescription Details   Frequency (times per week)  3    Duration  Progress to 30 minutes of continuous aerobic without signs/symptoms of physical distress      Intensity   THRR 40-80% of Max Heartrate  63-126    Ratings of Perceived Exertion  11-13    Perceived Dyspnea  0-4      Progression   Progression  Continue to progress workloads to maintain intensity without signs/symptoms of physical distress.      Resistance Training   Training Prescription  Yes    Weight  3lbs    Reps  10-15       Perform Capillary Blood Glucose checks as needed.  Exercise Prescription Changes: Exercise Prescription Changes    Row Name 01/07/18 1600 01/16/18 1600 01/28/18 1600 02/11/18 0900       Response to Exercise   Blood Pressure (Admit)  100/60  102/76  104/64  110/68    Blood Pressure (Exercise)  114/70  108/76  128/62  120/68    Blood Pressure (Exit)  104/60  92/60  104/60  100/62    Heart Rate (Admit)  99 bpm  103 bpm  100 bpm  99 bpm    Heart Rate (Exercise)  129 bpm  129 bpm  124 bpm  130 bpm    Heart Rate (Exit)  97 bpm  90 bpm  87 bpm  95 bpm    Rating of Perceived Exertion (Exercise)  11  11  11  12     Perceived Dyspnea (Exercise)  0  0  0  0    Symptoms  None  None  None  None    Comments  Pt's first day of exercise   -  None  None    Duration  Progress to 30 minutes of  aerobic without signs/symptoms of physical distress  Continue with 30 min of aerobic exercise without signs/symptoms of physical distress.  Continue with 30 min of aerobic exercise without signs/symptoms of physical distress.  Continue with 30 min of aerobic exercise without signs/symptoms of physical distress.    Intensity  THRR unchanged  THRR unchanged  THRR unchanged  THRR unchanged      Progression   Progression  Continue to progress workloads to maintain intensity without signs/symptoms of  physical distress.  Continue to progress workloads to maintain intensity without signs/symptoms of physical distress.  Continue to progress workloads to maintain intensity without signs/symptoms of physical distress.  Continue to progress workloads to maintain intensity without signs/symptoms of physical distress.    Average METs  2.3  3.13  2.7  2.96      Resistance Training   Training Prescription  Yes  No  Yes  Yes    Weight  3lbs  -  3lbs  3lbs    Reps  10-15  -  10-15  10-15    Time  10 Minutes  -  10 Minutes  10 Minutes      Interval Training   Interval Training  No  No  No  No      NuStep   Level  3  3  4  4     SPM  85  100  100  100    Minutes  10  10  10  10     METs  1.6  4.2  2.7  3      Arm Ergometer   Level  2  2  2  2     Minutes  10  10  10  10     METs  2.7  2.2  1.95  1.96      Track   Laps  11  13  14  16     Minutes  10  10  10  10     METs  2.91  3.3  3.45  3.76      Home Exercise Plan   Plans to continue exercise at  -  -  -  Home (comment) Walking    Frequency  -  -  -  Add 2 additional days to program exercise sessions.    Initial Home Exercises Provided  -  -  -  01/30/18       Exercise Comments: Exercise Comments    Row Name 01/07/18 1622 01/07/18 1626 01/30/18 1412       Exercise Comments  Today, was pt's first day of exercise. Pt responded well to exercise workloads. Will continue to monitor and progress pt as tolerated.   Today, was pt's first day of exercise. Pt responded well to exercise workloads. Will continue to monitor and progress pt as tolerated.   Reviewed HEP with pt. Pt verbalized understanding. Will continue to monitor and progress pt as tolerated.         Exercise Goals and Review: Exercise Goals    Row Name 01/01/18 0820             Exercise Goals   Increase Physical Activity  Yes       Intervention  Provide advice, education, support and counseling about physical activity/exercise needs.;Develop an individualized exercise  prescription for aerobic and resistive training based on initial evaluation findings, risk stratification, comorbidities and participant's personal goals.       Expected Outcomes  Short Term: Attend rehab on a regular basis to increase amount of physical activity.;Long Term: Exercising regularly at least 3-5 days a week.;Long Term: Add in home exercise to make exercise part of routine and to increase amount of physical activity.       Increase Strength and Stamina  Yes       Intervention  Provide advice, education, support and counseling about physical activity/exercise needs.;Develop an individualized exercise prescription for aerobic and resistive training based on initial evaluation findings, risk stratification, comorbidities and participant's personal goals.       Expected Outcomes  Short Term: Increase workloads from initial exercise prescription for resistance, speed, and METs.;Short Term: Perform resistance training exercises routinely during rehab and add in resistance training at home;Long Term: Improve cardiorespiratory fitness, muscular endurance and strength as measured by increased METs and functional capacity ( )       Able to understand and use rate of perceived exertion (RPE) scale  Yes       Intervention  Provide education and explanation on how to use RPE scale       Expected Outcomes  Short Term: Able to use RPE daily in rehab to express subjective intensity level;Long Term:  Able to use RPE to guide intensity level when exercising independently       Knowledge and understanding of Target Heart Rate Range (THRR)  Yes       Intervention  Provide education and explanation of THRR including how the numbers were predicted and where they are located for reference  Expected Outcomes  Short Term: Able to state/look up THRR;Long Term: Able to use THRR to govern intensity when exercising independently;Short Term: Able to use daily as guideline for intensity in rehab       Able to check  pulse independently  Yes       Intervention  Provide education and demonstration on how to check pulse in carotid and radial arteries.;Review the importance of being able to check your own pulse for safety during independent exercise       Expected Outcomes  Short Term: Able to explain why pulse checking is important during independent exercise;Long Term: Able to check pulse independently and accurately       Understanding of Exercise Prescription  Yes       Intervention  Provide education, explanation, and written materials on patient's individual exercise prescription       Expected Outcomes  Short Term: Able to explain program exercise prescription;Long Term: Able to explain home exercise prescription to exercise independently          Exercise Goals Re-Evaluation : Exercise Goals Re-Evaluation    Row Name 01/30/18 1408             Exercise Goal Re-Evaluation   Exercise Goals Review  Increase Physical Activity;Able to understand and use rate of perceived exertion (RPE) scale;Knowledge and understanding of Target Heart Rate Range (THRR);Understanding of Exercise Prescription;Increase Strength and Stamina;Able to check pulse independently       Comments  Reveiwed HEP with pt. Also reviewed THRR, RPE Scale, weather conditions, endpoints of exercise, NTG use, warmup and cool down.        Expected Outcomes  Pt will plan to continue to walk 2-3 days a week for 15 minutes. Pt will gradually add a minute to progress to 30 minutes of walking. Pt does have rower and bike at home and will use those from time to time. Will continue to monitor and progress as tolerated.           Discharge Exercise Prescription (Final Exercise Prescription Changes): Exercise Prescription Changes - 02/11/18 0900      Response to Exercise   Blood Pressure (Admit)  110/68    Blood Pressure (Exercise)  120/68    Blood Pressure (Exit)  100/62    Heart Rate (Admit)  99 bpm    Heart Rate (Exercise)  130 bpm    Heart  Rate (Exit)  95 bpm    Rating of Perceived Exertion (Exercise)  12    Perceived Dyspnea (Exercise)  0    Symptoms  None    Comments  None    Duration  Continue with 30 min of aerobic exercise without signs/symptoms of physical distress.    Intensity  THRR unchanged      Progression   Progression  Continue to progress workloads to maintain intensity without signs/symptoms of physical distress.    Average METs  2.96      Resistance Training   Training Prescription  Yes    Weight  3lbs    Reps  10-15    Time  10 Minutes      Interval Training   Interval Training  No      NuStep   Level  4    SPM  100    Minutes  10    METs  3      Arm Ergometer   Level  2    Minutes  10    METs  1.96  Track   Laps  16    Minutes  10    METs  3.76      Home Exercise Plan   Plans to continue exercise at  Home (comment)   Walking   Frequency  Add 2 additional days to program exercise sessions.    Initial Home Exercises Provided  01/30/18       Nutrition:  Target Goals: Understanding of nutrition guidelines, daily intake of sodium 1500mg , cholesterol 200mg , calories 30% from fat and 7% or less from saturated fats, daily to have 5 or more servings of fruits and vegetables.  Biometrics: Pre Biometrics - 01/01/18 1155      Pre Biometrics   Height  5' 8.25" (1.734 m)    Weight  (!) 141.9 kg    Waist Circumference  50.5 inches    Hip Circumference  60 inches    Waist to Hip Ratio  0.84 %    BMI (Calculated)  47.19    Triceps Skinfold  60 mm    % Body Fat  57.7 %    Grip Strength  36 kg    Flexibility  14 in    Single Leg Stand  4.18 seconds        Nutrition Therapy Plan and Nutrition Goals: Nutrition Therapy & Goals - 01/01/18 0916      Nutrition Therapy   Diet  heart healthy, carb modified      Personal Nutrition Goals   Nutrition Goal  Pt to identify and limit food sources of saturated fat, trans fat, refined carbohydrates and sodium    Personal Goal #2  Pt to  identify food quantities necessary to achieve weight loss of 6-24 lbs. at graduation from cardiac rehab.    Personal Goal #3  Pt able to name foods that affect blood glucose      Intervention Plan   Intervention  Prescribe, educate and counsel regarding individualized specific dietary modifications aiming towards targeted core components such as weight, hypertension, lipid management, diabetes, heart failure and other comorbidities.    Expected Outcomes  Short Term Goal: Understand basic principles of dietary content, such as calories, fat, sodium, cholesterol and nutrients.;Long Term Goal: Adherence to prescribed nutrition plan.       Nutrition Assessments: Nutrition Assessments - 01/01/18 0917      MEDFICTS Scores   Pre Score  12       Nutrition Goals Re-Evaluation:   Nutrition Goals Re-Evaluation:   Nutrition Goals Discharge (Final Nutrition Goals Re-Evaluation):   Psychosocial: Target Goals: Acknowledge presence or absence of significant depression and/or stress, maximize coping skills, provide positive support system. Participant is able to verbalize types and ability to use techniques and skills needed for reducing stress and depression.  Initial Review & Psychosocial Screening: Initial Psych Review & Screening - 01/01/18 1221      Initial Review   Current issues with  None Identified      Family Dynamics   Good Support System?  Yes      Barriers   Psychosocial barriers to participate in program  There are no identifiable barriers or psychosocial needs.      Screening Interventions   Interventions  Encouraged to exercise       Quality of Life Scores: Quality of Life - 01/01/18 1030      Quality of Life   Select  Quality of Life      Quality of Life Scores   Health/Function Pre  23.6 %    Socioeconomic  Pre  29.25 %    Psych/Spiritual Pre  26.57 %    Family Pre  28.8 %    GLOBAL Pre  26.23 %      Scores of 19 and below usually indicate a poorer quality  of life in these areas.  A difference of  2-3 points is a clinically meaningful difference.  A difference of 2-3 points in the total score of the Quality of Life Index has been associated with significant improvement in overall quality of life, self-image, physical symptoms, and general health in studies assessing change in quality of life.  PHQ-9: Recent Review Flowsheet Data    Depression screen [facility] 2/9 01/07/2018 12/04/2017   Decreased Interest 0 0   Down, Depressed, Hopeless 0 0   PHQ - 2 Score 0 0     Interpretation of Total Score  Total Score Depression Severity:  1-4 = Minimal depression, 5-9 = Mild depression, 10-14 = Moderate depression, 15-19 = Moderately severe depression, 20-27 = Severe depression   Psychosocial Evaluation and Intervention: Psychosocial Evaluation - 01/07/18 0900      Psychosocial Evaluation & Interventions   Interventions  Encouraged to exercise with the program and follow exercise prescription    Comments  No psychosocial needs identified. Pt enjoys drawing, painting, and playing with her grandchildren.     Expected Outcomes  Kelle will continue to exhibit a positive outlook.     Continue Psychosocial Services   No Follow up required       Psychosocial Re-Evaluation: Psychosocial Re-Evaluation    Row Name 01/17/18 0732 02/08/18 1354           Psychosocial Re-Evaluation   Current issues with  None Identified  None Identified      Comments  No interventions necessary.  No interventions necessary.      Expected Outcomes  Juanell will maintain a positive outlook.  Chundra will maintain a positive outlook with good coping skills.       Interventions  Encouraged to attend Cardiac Rehabilitation for the exercise  Encouraged to attend Cardiac Rehabilitation for the exercise      Continue Psychosocial Services   No Follow up required  No Follow up required         Psychosocial Discharge (Final Psychosocial Re-Evaluation): Psychosocial Re-Evaluation - 02/08/18  1354      Psychosocial Re-Evaluation   Current issues with  None Identified    Comments  No interventions necessary.    Expected Outcomes  Yukie will maintain a positive outlook with good coping skills.     Interventions  Encouraged to attend Cardiac Rehabilitation for the exercise    Continue Psychosocial Services   No Follow up required       Vocational Rehabilitation: Provide vocational rehab assistance to qualifying candidates.   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 Participant will state understanding/return demonstration of topics presented.  Learning Barriers/Preferences: Learning Barriers/Preferences - 01/01/18 1225      Learning Barriers/Preferences   Learning Barriers  Sight   Wears glasses   Learning Preferences  Pictoral;Skilled Demonstration       Education Topics: Count Your Pulse:  -Group instruction provided by verbal instruction, demonstration, patient participation and written materials to  support subject.  Instructors address importance of being able to find your pulse and how to count your pulse when at home without a heart monitor.  Patients get hands on experience counting their pulse with staff help and individually.   CARDIAC REHAB PHASE II EXERCISE from 02/08/2018 in [facility] CARDIAC REHAB  Date  02/08/18  Instruction Review Code  3- Needs Reinforcement      Heart Attack, Angina, and Risk Factor Modification:  -Group instruction provided by verbal instruction, video, and written materials to support subject.  Instructors address signs and symptoms of angina and heart attacks.    Also discuss risk factors for heart disease and how to make changes  to improve heart health risk factors.   CARDIAC REHAB PHASE II EXERCISE from 02/08/2018 in [facility] CARDIAC REHAB  Date  01/16/18 [Patient unable to stay for class, handout given.]  Instruction Review Code  2- Demonstrated Understanding      Functional Fitness:  -Group instruction provided by verbal instruction, demonstration, patient participation, and written materials to support subject.  Instructors address safety measures for doing things around the house.  Discuss how to get up and down off the floor, how to pick things up properly, how to safely get out of a chair without assistance, and balance training.   Meditation and Mindfulness:  -Group instruction provided by verbal instruction, patient participation, and written materials to support subject.  Instructor addresses importance of mindfulness and meditation practice to help reduce stress and improve awareness.  Instructor also leads participants through a meditation exercise.    CARDIAC REHAB PHASE II EXERCISE from 02/08/2018 in [facility] CARDIAC REHAB  Date  02/06/18  Educator  Theda Belfast  Instruction Review Code  2- Demonstrated Understanding      Stretching for Flexibility and Mobility:  -Group instruction provided by verbal instruction, patient participation, and written materials to support subject.  Instructors lead participants through series of stretches that are designed to increase flexibility thus improving mobility.  These stretches are additional exercise for major muscle groups that are typically performed during regular warm up and cool down.   Hands Only CPR:  -Group verbal, video, and participation provides a basic overview of AHA guidelines for community CPR. Role-play of emergencies allow participants the opportunity to practice calling for help and chest compression technique with discussion of AED use.   Hypertension: -Group verbal and written instruction that  provides a basic overview of hypertension including the most recent diagnostic guidelines, risk factor reduction with self-care instructions and medication management.    Nutrition I class: Heart Healthy Eating:  -Group instruction provided by PowerPoint slides, verbal discussion, and written materials to support subject matter. The instructor gives an explanation and review of the Therapeutic Lifestyle Changes diet recommendations, which includes a discussion on lipid goals, dietary fat, sodium, fiber, plant stanol/sterol esters, sugar, and the components of a well-balanced, healthy diet.   Nutrition II class: Lifestyle Skills:  -Group instruction provided by PowerPoint slides, verbal discussion, and written materials to support subject matter. The instructor gives an explanation and review of label reading, grocery shopping for heart health, heart healthy recipe modifications, and ways to make healthier choices when eating out.   Diabetes Question & Answer:  -Group instruction provided by PowerPoint slides, verbal discussion, and written materials to support subject matter. The instructor gives an explanation and review of diabetes co-morbidities, pre- and post-prandial blood glucose goals, pre-exercise blood glucose goals, signs, symptoms, and  treatment of hypoglycemia and hyperglycemia, and foot care basics.   Diabetes Blitz:  -Group instruction provided by PowerPoint slides, verbal discussion, and written materials to support subject matter. The instructor gives an explanation and review of the physiology behind type 1 and type 2 diabetes, diabetes medications and rational behind using different medications, pre- and post-prandial blood glucose recommendations and Hemoglobin A1c goals, diabetes diet, and exercise including blood glucose guidelines for exercising safely.    Portion Distortion:  -Group instruction provided by PowerPoint slides, verbal discussion, written materials, and food  models to support subject matter. The instructor gives an explanation of serving size versus portion size, changes in portions sizes over the last 20 years, and what consists of a serving from each food group.   Stress Management:  -Group instruction provided by verbal instruction, video, and written materials to support subject matter.  Instructors review role of stress in heart disease and how to cope with stress positively.     Exercising on Your Own:  -Group instruction provided by verbal instruction, power point, and written materials to support subject.  Instructors discuss benefits of exercise, components of exercise, frequency and intensity of exercise, and end points for exercise.  Also discuss use of nitroglycerin and activating EMS.  Review options of places to exercise outside of rehab.  Review guidelines for sex with heart disease.   Cardiac Drugs I:  -Group instruction provided by verbal instruction and written materials to support subject.  Instructor reviews cardiac drug classes: antiplatelets, anticoagulants, beta blockers, and statins.  Instructor discusses reasons, side effects, and lifestyle considerations for each drug class.   Cardiac Drugs II:  -Group instruction provided by verbal instruction and written materials to support subject.  Instructor reviews cardiac drug classes: angiotensin converting enzyme inhibitors (ACE-I), angiotensin II receptor blockers (ARBs), nitrates, and calcium channel blockers.  Instructor discusses reasons, side effects, and lifestyle considerations for each drug class.   CARDIAC REHAB PHASE II EXERCISE from 02/08/2018 in [facility] CARDIAC REHAB  Date  01/23/18  Educator  Pharmacist  Instruction Review Code  2- Demonstrated Understanding      Anatomy and Physiology of the Circulatory System:  Group verbal and written instruction and models provide basic cardiac anatomy and physiology, with the coronary electrical and  arterial systems. Review of: AMI, Angina, Valve disease, Heart Failure, Peripheral Artery Disease, Cardiac Arrhythmia, Pacemakers, and the ICD.   CARDIAC REHAB PHASE II EXERCISE from 01/09/2018 in [facility] CARDIAC REHAB  Date  01/09/18  Instruction Review Code  2- Demonstrated Understanding      Other Education:  -Group or individual verbal, written, or video instructions that support the educational goals of the cardiac rehab program.   Holiday Eating Survival Tips:  -Group instruction provided by PowerPoint slides, verbal discussion, and written materials to support subject matter. The instructor gives patients tips, tricks, and techniques to help them not only survive but enjoy the holidays despite the onslaught of food that accompanies the holidays.   Knowledge Questionnaire Score: Knowledge Questionnaire Score - 01/01/18 1027      Knowledge Questionnaire Score   Pre Score  21/24        Core Components/Risk Factors/Patient Goals at Admission: Personal Goals and Risk Factors at Admission - 01/01/18 1228      Core Components/Risk Factors/Patient Goals on Admission    Weight Management  Obesity;Yes    Intervention  Weight Management: Develop a combined nutrition and exercise program designed to reach desired caloric intake,  while maintaining appropriate intake of nutrient and fiber, sodium and fats, and appropriate energy expenditure required for the weight goal.;Weight Management: Provide education and appropriate resources to help participant work on and attain dietary goals.;Weight Management/Obesity: Establish reasonable short term and long term weight goals.;Obesity: Provide education and appropriate resources to help participant work on and attain dietary goals.    Admit Weight  312 lb 13.3 oz (141.9 kg)    Expected Outcomes  Short Term: Continue to assess and modify interventions until short term weight is achieved;Long Term: Adherence to nutrition and  physical activity/exercise program aimed toward attainment of established weight goal;Weight Maintenance: Understanding of the daily nutrition guidelines, which includes 25-35% calories from fat, 7% or less cal from saturated fats, less than 200mg  cholesterol, less than 1.5gm of sodium, & 5 or more servings of fruits and vegetables daily;Weight Loss: Understanding of general recommendations for a balanced deficit meal plan, which promotes 1-2 lb weight loss per week and includes a negative energy balance of [phone] kcal/d;Understanding recommendations for meals to include 15-35% energy as protein, 25-35% energy from fat, 35-60% energy from carbohydrates, less than 200mg  of dietary cholesterol, 20-35 gm of total fiber daily;Understanding of distribution of calorie intake throughout the day with the consumption of 4-5 meals/snacks    Hypertension  Yes    Intervention  Provide education on lifestyle modifcations including regular physical activity/exercise, weight management, moderate sodium restriction and increased consumption of fresh fruit, vegetables, and low fat dairy, alcohol moderation, and smoking cessation.;Monitor prescription use compliance.    Expected Outcomes  Short Term: Continued assessment and intervention until BP is < 140/20mm HG in hypertensive participants. < 130/43mm HG in hypertensive participants with diabetes, heart failure or chronic kidney disease.;Long Term: Maintenance of blood pressure at goal levels.    Lipids  Yes    Intervention  Provide education and support for participant on nutrition & aerobic/resistive exercise along with prescribed medications to achieve LDL 70mg , HDL >40mg .    Expected Outcomes  Short Term: Participant states understanding of desired cholesterol values and is compliant with medications prescribed. Participant is following exercise prescription and nutrition guidelines.;Long Term: Cholesterol controlled with medications as prescribed, with individualized  exercise RX and with personalized nutrition plan. Value goals: LDL < 70mg , HDL > 40 mg.    Stress  Yes    Intervention  Offer individual and/or small group education and counseling on adjustment to heart disease, stress management and health-related lifestyle change. Teach and support self-help strategies.;Refer participants experiencing significant psychosocial distress to appropriate mental health specialists for further evaluation and treatment. When possible, include family members and significant others in education/counseling sessions.    Expected Outcomes  Short Term: Participant demonstrates changes in health-related behavior, relaxation and other stress management skills, ability to obtain effective social support, and compliance with psychotropic medications if prescribed.;Long Term: Emotional wellbeing is indicated by absence of clinically significant psychosocial distress or social isolation.       Core Components/Risk Factors/Patient Goals Review:  Goals and Risk Factor Review    Row Name 01/07/18 0901 01/17/18 0734 02/08/18 1354         Core Components/Risk Factors/Patient Goals Review   Personal Goals Review  Weight Management/Obesity;Lipids;Hypertension;Stress  Weight Management/Obesity;Lipids;Hypertension;Stress  Weight Management/Obesity;Lipids;Hypertension;Stress     Review  Pt with multiple CAD RFs willing to participate in CR exercise.  Pt would like to lose weight and get stronger.   Pt with multiple CAD RFs willing to participate in CR exercise.  Mtizi is off to  a great start. She is tolerating exercise well.   Pt with multiple CAD RFs willing to participate in CR exercise.  Mtzi continues to tolerate exercise well.  She has great attendance.      Expected Outcomes  Pt will continue to participate in CR exercise, nutrition, and lifestyle modification opportunities.   Pt will continue to participate in CR exercise, nutrition, and lifestyle modification opportunities.   Pt will  continue to participate in CR exercise, nutrition, and lifestyle modification opportunities.         Core Components/Risk Factors/Patient Goals at Discharge (Final Review):  Goals and Risk Factor Review - 02/08/18 1354      Core Components/Risk Factors/Patient Goals Review   Personal Goals Review  Weight Management/Obesity;Lipids;Hypertension;Stress    Review  Pt with multiple CAD RFs willing to participate in CR exercise.  Mtzi continues to tolerate exercise well.  She has great attendance.     Expected Outcomes  Pt will continue to participate in CR exercise, nutrition, and lifestyle modification opportunities.        ITP Comments: ITP Comments    Row Name 01/01/18 0744 01/07/18 0858 01/17/18 0730 02/08/18 1353     ITP Comments  Medical Director- Dr. Armanda Magic, MD  Pt started exercise today and tolerated it well.   30 Day ITP Review.  Supriya is off to a great start with exercise. VSS.  30 Day ITP Review.  Symphany is tolerating exercise well. She has great attendance. She continues to lose weigt.        Comments: See ITP Comments.

## 2018-02-15 ENCOUNTER — Encounter (HOSPITAL_COMMUNITY)
Admission: RE | Admit: 2018-02-15 | Discharge: 2018-02-15 | Disposition: A | Discharge status: Home / Self Care | Payer: BLUE CROSS/BLUE SHIELD | Source: Ambulatory Visit | Attending: Cardiology | Admitting: Cardiology

## 2018-02-15 ENCOUNTER — Encounter (HOSPITAL_COMMUNITY): Payer: BLUE CROSS/BLUE SHIELD

## 2018-02-15 DIAGNOSIS — Z955 Presence of coronary angioplasty implant and graft: Secondary | ICD-10-CM | POA: Diagnosis not present

## 2018-02-15 DIAGNOSIS — Z79899 Other long term (current) drug therapy: Secondary | ICD-10-CM | POA: Diagnosis not present

## 2018-02-15 DIAGNOSIS — E785 Hyperlipidemia, unspecified: Secondary | ICD-10-CM | POA: Insufficient documentation

## 2018-02-15 DIAGNOSIS — Z7902 Long term (current) use of antithrombotics/antiplatelets: Secondary | ICD-10-CM | POA: Insufficient documentation

## 2018-02-15 DIAGNOSIS — E559 Vitamin D deficiency, unspecified: Secondary | ICD-10-CM | POA: Insufficient documentation

## 2018-02-15 DIAGNOSIS — J45909 Unspecified asthma, uncomplicated: Secondary | ICD-10-CM | POA: Insufficient documentation

## 2018-02-15 DIAGNOSIS — Z96651 Presence of right artificial knee joint: Secondary | ICD-10-CM | POA: Diagnosis not present

## 2018-02-15 DIAGNOSIS — K219 Gastro-esophageal reflux disease without esophagitis: Secondary | ICD-10-CM | POA: Insufficient documentation

## 2018-02-15 DIAGNOSIS — I252 Old myocardial infarction: Secondary | ICD-10-CM | POA: Insufficient documentation

## 2018-02-15 DIAGNOSIS — I2102 ST elevation (STEMI) myocardial infarction involving left anterior descending coronary artery: Secondary | ICD-10-CM

## 2018-02-15 DIAGNOSIS — Z6841 Body Mass Index (BMI) 40.0 and over, adult: Secondary | ICD-10-CM | POA: Insufficient documentation

## 2018-02-15 DIAGNOSIS — Z7982 Long term (current) use of aspirin: Secondary | ICD-10-CM | POA: Insufficient documentation

## 2018-02-15 DIAGNOSIS — I10 Essential (primary) hypertension: Secondary | ICD-10-CM | POA: Insufficient documentation

## 2018-02-15 DIAGNOSIS — I2510 Atherosclerotic heart disease of native coronary artery without angina pectoris: Secondary | ICD-10-CM | POA: Diagnosis not present

## 2018-02-15 DIAGNOSIS — K760 Fatty (change of) liver, not elsewhere classified: Secondary | ICD-10-CM | POA: Diagnosis not present

## 2018-02-15 DIAGNOSIS — Z7951 Long term (current) use of inhaled steroids: Secondary | ICD-10-CM | POA: Diagnosis not present

## 2018-02-18 ENCOUNTER — Encounter (HOSPITAL_COMMUNITY)
Admission: RE | Admit: 2018-02-18 | Discharge: 2018-02-18 | Disposition: A | Discharge status: Home / Self Care | Payer: BLUE CROSS/BLUE SHIELD | Source: Ambulatory Visit | Attending: Cardiology | Admitting: Cardiology

## 2018-02-18 ENCOUNTER — Encounter (HOSPITAL_COMMUNITY): Payer: BLUE CROSS/BLUE SHIELD

## 2018-02-18 DIAGNOSIS — I252 Old myocardial infarction: Secondary | ICD-10-CM | POA: Diagnosis not present

## 2018-02-18 DIAGNOSIS — I2102 ST elevation (STEMI) myocardial infarction involving left anterior descending coronary artery: Secondary | ICD-10-CM

## 2018-02-18 DIAGNOSIS — J45909 Unspecified asthma, uncomplicated: Secondary | ICD-10-CM | POA: Diagnosis not present

## 2018-02-18 DIAGNOSIS — E785 Hyperlipidemia, unspecified: Secondary | ICD-10-CM | POA: Diagnosis not present

## 2018-02-18 DIAGNOSIS — Z955 Presence of coronary angioplasty implant and graft: Secondary | ICD-10-CM | POA: Diagnosis not present

## 2018-02-18 DIAGNOSIS — E559 Vitamin D deficiency, unspecified: Secondary | ICD-10-CM | POA: Diagnosis not present

## 2018-02-18 DIAGNOSIS — Z7951 Long term (current) use of inhaled steroids: Secondary | ICD-10-CM | POA: Diagnosis not present

## 2018-02-18 DIAGNOSIS — I10 Essential (primary) hypertension: Secondary | ICD-10-CM | POA: Diagnosis not present

## 2018-02-18 DIAGNOSIS — Z6841 Body Mass Index (BMI) 40.0 and over, adult: Secondary | ICD-10-CM | POA: Diagnosis not present

## 2018-02-18 DIAGNOSIS — Z79899 Other long term (current) drug therapy: Secondary | ICD-10-CM | POA: Diagnosis not present

## 2018-02-18 DIAGNOSIS — Z96651 Presence of right artificial knee joint: Secondary | ICD-10-CM | POA: Diagnosis not present

## 2018-02-18 DIAGNOSIS — K219 Gastro-esophageal reflux disease without esophagitis: Secondary | ICD-10-CM | POA: Diagnosis not present

## 2018-02-18 DIAGNOSIS — I2510 Atherosclerotic heart disease of native coronary artery without angina pectoris: Secondary | ICD-10-CM | POA: Diagnosis not present

## 2018-02-18 DIAGNOSIS — Z7902 Long term (current) use of antithrombotics/antiplatelets: Secondary | ICD-10-CM | POA: Diagnosis not present

## 2018-02-18 DIAGNOSIS — Z7982 Long term (current) use of aspirin: Secondary | ICD-10-CM | POA: Diagnosis not present

## 2018-02-18 DIAGNOSIS — K760 Fatty (change of) liver, not elsewhere classified: Secondary | ICD-10-CM | POA: Diagnosis not present

## 2018-02-20 ENCOUNTER — Ambulatory Visit
Admission: RE | Admit: 2018-02-20 | Discharge: 2018-02-20 | Disposition: A | Discharge status: Home / Self Care | Payer: BLUE CROSS/BLUE SHIELD | Source: Ambulatory Visit | Attending: Family Medicine | Admitting: Family Medicine

## 2018-02-20 ENCOUNTER — Encounter (HOSPITAL_COMMUNITY): Payer: BLUE CROSS/BLUE SHIELD

## 2018-02-20 ENCOUNTER — Encounter (HOSPITAL_COMMUNITY)
Admission: RE | Admit: 2018-02-20 | Discharge: 2018-02-20 | Disposition: A | Discharge status: Home / Self Care | Payer: BLUE CROSS/BLUE SHIELD | Source: Ambulatory Visit | Attending: Cardiology | Admitting: Cardiology

## 2018-02-20 ENCOUNTER — Ambulatory Visit: Payer: BLUE CROSS/BLUE SHIELD

## 2018-02-20 DIAGNOSIS — Z6841 Body Mass Index (BMI) 40.0 and over, adult: Secondary | ICD-10-CM | POA: Diagnosis not present

## 2018-02-20 DIAGNOSIS — Z1231 Encounter for screening mammogram for malignant neoplasm of breast: Secondary | ICD-10-CM | POA: Diagnosis not present

## 2018-02-20 DIAGNOSIS — Z7982 Long term (current) use of aspirin: Secondary | ICD-10-CM | POA: Diagnosis not present

## 2018-02-20 DIAGNOSIS — I252 Old myocardial infarction: Secondary | ICD-10-CM | POA: Diagnosis not present

## 2018-02-20 DIAGNOSIS — Z7951 Long term (current) use of inhaled steroids: Secondary | ICD-10-CM | POA: Diagnosis not present

## 2018-02-20 DIAGNOSIS — Z96651 Presence of right artificial knee joint: Secondary | ICD-10-CM | POA: Diagnosis not present

## 2018-02-20 DIAGNOSIS — E785 Hyperlipidemia, unspecified: Secondary | ICD-10-CM | POA: Diagnosis not present

## 2018-02-20 DIAGNOSIS — K219 Gastro-esophageal reflux disease without esophagitis: Secondary | ICD-10-CM | POA: Diagnosis not present

## 2018-02-20 DIAGNOSIS — I2510 Atherosclerotic heart disease of native coronary artery without angina pectoris: Secondary | ICD-10-CM | POA: Diagnosis not present

## 2018-02-20 DIAGNOSIS — Z7902 Long term (current) use of antithrombotics/antiplatelets: Secondary | ICD-10-CM | POA: Diagnosis not present

## 2018-02-20 DIAGNOSIS — I10 Essential (primary) hypertension: Secondary | ICD-10-CM | POA: Diagnosis not present

## 2018-02-20 DIAGNOSIS — K760 Fatty (change of) liver, not elsewhere classified: Secondary | ICD-10-CM | POA: Diagnosis not present

## 2018-02-20 DIAGNOSIS — I2102 ST elevation (STEMI) myocardial infarction involving left anterior descending coronary artery: Secondary | ICD-10-CM

## 2018-02-20 DIAGNOSIS — Z79899 Other long term (current) drug therapy: Secondary | ICD-10-CM | POA: Diagnosis not present

## 2018-02-20 DIAGNOSIS — Z955 Presence of coronary angioplasty implant and graft: Secondary | ICD-10-CM

## 2018-02-20 DIAGNOSIS — E559 Vitamin D deficiency, unspecified: Secondary | ICD-10-CM | POA: Diagnosis not present

## 2018-02-20 DIAGNOSIS — J45909 Unspecified asthma, uncomplicated: Secondary | ICD-10-CM | POA: Diagnosis not present

## 2018-02-22 ENCOUNTER — Encounter (HOSPITAL_COMMUNITY)
Admission: RE | Admit: 2018-02-22 | Discharge: 2018-02-22 | Disposition: A | Discharge status: Home / Self Care | Payer: BLUE CROSS/BLUE SHIELD | Source: Ambulatory Visit | Attending: Cardiology | Admitting: Cardiology

## 2018-02-22 ENCOUNTER — Encounter (HOSPITAL_COMMUNITY): Payer: BLUE CROSS/BLUE SHIELD

## 2018-02-22 DIAGNOSIS — Z6841 Body Mass Index (BMI) 40.0 and over, adult: Secondary | ICD-10-CM | POA: Diagnosis not present

## 2018-02-22 DIAGNOSIS — Z955 Presence of coronary angioplasty implant and graft: Secondary | ICD-10-CM | POA: Diagnosis not present

## 2018-02-22 DIAGNOSIS — J45909 Unspecified asthma, uncomplicated: Secondary | ICD-10-CM | POA: Diagnosis not present

## 2018-02-22 DIAGNOSIS — Z7902 Long term (current) use of antithrombotics/antiplatelets: Secondary | ICD-10-CM | POA: Diagnosis not present

## 2018-02-22 DIAGNOSIS — I2510 Atherosclerotic heart disease of native coronary artery without angina pectoris: Secondary | ICD-10-CM | POA: Diagnosis not present

## 2018-02-22 DIAGNOSIS — Z7982 Long term (current) use of aspirin: Secondary | ICD-10-CM | POA: Diagnosis not present

## 2018-02-22 DIAGNOSIS — E785 Hyperlipidemia, unspecified: Secondary | ICD-10-CM | POA: Diagnosis not present

## 2018-02-22 DIAGNOSIS — I252 Old myocardial infarction: Secondary | ICD-10-CM | POA: Diagnosis not present

## 2018-02-22 DIAGNOSIS — K760 Fatty (change of) liver, not elsewhere classified: Secondary | ICD-10-CM | POA: Diagnosis not present

## 2018-02-22 DIAGNOSIS — Z96651 Presence of right artificial knee joint: Secondary | ICD-10-CM | POA: Diagnosis not present

## 2018-02-22 DIAGNOSIS — I10 Essential (primary) hypertension: Secondary | ICD-10-CM | POA: Diagnosis not present

## 2018-02-22 DIAGNOSIS — I2102 ST elevation (STEMI) myocardial infarction involving left anterior descending coronary artery: Secondary | ICD-10-CM

## 2018-02-22 DIAGNOSIS — Z7951 Long term (current) use of inhaled steroids: Secondary | ICD-10-CM | POA: Diagnosis not present

## 2018-02-22 DIAGNOSIS — E559 Vitamin D deficiency, unspecified: Secondary | ICD-10-CM | POA: Diagnosis not present

## 2018-02-22 DIAGNOSIS — K219 Gastro-esophageal reflux disease without esophagitis: Secondary | ICD-10-CM | POA: Diagnosis not present

## 2018-02-22 DIAGNOSIS — Z79899 Other long term (current) drug therapy: Secondary | ICD-10-CM | POA: Diagnosis not present

## 2018-02-25 ENCOUNTER — Encounter (HOSPITAL_COMMUNITY): Payer: BLUE CROSS/BLUE SHIELD

## 2018-02-25 ENCOUNTER — Encounter (HOSPITAL_COMMUNITY)
Admission: RE | Admit: 2018-02-25 | Discharge: 2018-02-25 | Disposition: A | Discharge status: Home / Self Care | Payer: BLUE CROSS/BLUE SHIELD | Source: Ambulatory Visit | Attending: Cardiology | Admitting: Cardiology

## 2018-02-25 DIAGNOSIS — J45909 Unspecified asthma, uncomplicated: Secondary | ICD-10-CM | POA: Diagnosis not present

## 2018-02-25 DIAGNOSIS — Z955 Presence of coronary angioplasty implant and graft: Secondary | ICD-10-CM

## 2018-02-25 DIAGNOSIS — Z7902 Long term (current) use of antithrombotics/antiplatelets: Secondary | ICD-10-CM | POA: Diagnosis not present

## 2018-02-25 DIAGNOSIS — Z6841 Body Mass Index (BMI) 40.0 and over, adult: Secondary | ICD-10-CM | POA: Diagnosis not present

## 2018-02-25 DIAGNOSIS — I2510 Atherosclerotic heart disease of native coronary artery without angina pectoris: Secondary | ICD-10-CM | POA: Diagnosis not present

## 2018-02-25 DIAGNOSIS — I252 Old myocardial infarction: Secondary | ICD-10-CM | POA: Diagnosis not present

## 2018-02-25 DIAGNOSIS — Z7951 Long term (current) use of inhaled steroids: Secondary | ICD-10-CM | POA: Diagnosis not present

## 2018-02-25 DIAGNOSIS — I2102 ST elevation (STEMI) myocardial infarction involving left anterior descending coronary artery: Secondary | ICD-10-CM

## 2018-02-25 DIAGNOSIS — I10 Essential (primary) hypertension: Secondary | ICD-10-CM | POA: Diagnosis not present

## 2018-02-25 DIAGNOSIS — K219 Gastro-esophageal reflux disease without esophagitis: Secondary | ICD-10-CM | POA: Diagnosis not present

## 2018-02-25 DIAGNOSIS — Z79899 Other long term (current) drug therapy: Secondary | ICD-10-CM | POA: Diagnosis not present

## 2018-02-25 DIAGNOSIS — Z96651 Presence of right artificial knee joint: Secondary | ICD-10-CM | POA: Diagnosis not present

## 2018-02-25 DIAGNOSIS — K760 Fatty (change of) liver, not elsewhere classified: Secondary | ICD-10-CM | POA: Diagnosis not present

## 2018-02-25 DIAGNOSIS — E785 Hyperlipidemia, unspecified: Secondary | ICD-10-CM | POA: Diagnosis not present

## 2018-02-25 DIAGNOSIS — Z7982 Long term (current) use of aspirin: Secondary | ICD-10-CM | POA: Diagnosis not present

## 2018-02-25 DIAGNOSIS — E559 Vitamin D deficiency, unspecified: Secondary | ICD-10-CM | POA: Diagnosis not present

## 2018-02-27 ENCOUNTER — Encounter (HOSPITAL_COMMUNITY)
Admission: RE | Admit: 2018-02-27 | Discharge: 2018-02-27 | Disposition: A | Discharge status: Home / Self Care | Payer: BLUE CROSS/BLUE SHIELD | Source: Ambulatory Visit | Attending: Cardiology | Admitting: Cardiology

## 2018-02-27 ENCOUNTER — Encounter (HOSPITAL_COMMUNITY): Payer: BLUE CROSS/BLUE SHIELD

## 2018-02-27 DIAGNOSIS — E559 Vitamin D deficiency, unspecified: Secondary | ICD-10-CM | POA: Diagnosis not present

## 2018-02-27 DIAGNOSIS — Z96651 Presence of right artificial knee joint: Secondary | ICD-10-CM | POA: Diagnosis not present

## 2018-02-27 DIAGNOSIS — K219 Gastro-esophageal reflux disease without esophagitis: Secondary | ICD-10-CM | POA: Diagnosis not present

## 2018-02-27 DIAGNOSIS — J45909 Unspecified asthma, uncomplicated: Secondary | ICD-10-CM | POA: Diagnosis not present

## 2018-02-27 DIAGNOSIS — Z6841 Body Mass Index (BMI) 40.0 and over, adult: Secondary | ICD-10-CM | POA: Diagnosis not present

## 2018-02-27 DIAGNOSIS — I252 Old myocardial infarction: Secondary | ICD-10-CM | POA: Diagnosis not present

## 2018-02-27 DIAGNOSIS — Z7951 Long term (current) use of inhaled steroids: Secondary | ICD-10-CM | POA: Diagnosis not present

## 2018-02-27 DIAGNOSIS — Z955 Presence of coronary angioplasty implant and graft: Secondary | ICD-10-CM | POA: Diagnosis not present

## 2018-02-27 DIAGNOSIS — E785 Hyperlipidemia, unspecified: Secondary | ICD-10-CM | POA: Diagnosis not present

## 2018-02-27 DIAGNOSIS — Z7982 Long term (current) use of aspirin: Secondary | ICD-10-CM | POA: Diagnosis not present

## 2018-02-27 DIAGNOSIS — K760 Fatty (change of) liver, not elsewhere classified: Secondary | ICD-10-CM | POA: Diagnosis not present

## 2018-02-27 DIAGNOSIS — Z79899 Other long term (current) drug therapy: Secondary | ICD-10-CM | POA: Diagnosis not present

## 2018-02-27 DIAGNOSIS — I10 Essential (primary) hypertension: Secondary | ICD-10-CM | POA: Diagnosis not present

## 2018-02-27 DIAGNOSIS — Z7902 Long term (current) use of antithrombotics/antiplatelets: Secondary | ICD-10-CM | POA: Diagnosis not present

## 2018-02-27 DIAGNOSIS — I2510 Atherosclerotic heart disease of native coronary artery without angina pectoris: Secondary | ICD-10-CM | POA: Diagnosis not present

## 2018-03-01 ENCOUNTER — Encounter (HOSPITAL_COMMUNITY): Payer: BLUE CROSS/BLUE SHIELD

## 2018-03-01 ENCOUNTER — Encounter (HOSPITAL_COMMUNITY)
Admission: RE | Admit: 2018-03-01 | Discharge: 2018-03-01 | Disposition: A | Discharge status: Home / Self Care | Payer: BLUE CROSS/BLUE SHIELD | Source: Ambulatory Visit | Attending: Cardiology | Admitting: Cardiology

## 2018-03-01 DIAGNOSIS — Z7982 Long term (current) use of aspirin: Secondary | ICD-10-CM | POA: Diagnosis not present

## 2018-03-01 DIAGNOSIS — Z7951 Long term (current) use of inhaled steroids: Secondary | ICD-10-CM | POA: Diagnosis not present

## 2018-03-01 DIAGNOSIS — Z96651 Presence of right artificial knee joint: Secondary | ICD-10-CM | POA: Diagnosis not present

## 2018-03-01 DIAGNOSIS — I2510 Atherosclerotic heart disease of native coronary artery without angina pectoris: Secondary | ICD-10-CM | POA: Diagnosis not present

## 2018-03-01 DIAGNOSIS — Z6841 Body Mass Index (BMI) 40.0 and over, adult: Secondary | ICD-10-CM | POA: Diagnosis not present

## 2018-03-01 DIAGNOSIS — J45909 Unspecified asthma, uncomplicated: Secondary | ICD-10-CM | POA: Diagnosis not present

## 2018-03-01 DIAGNOSIS — E785 Hyperlipidemia, unspecified: Secondary | ICD-10-CM | POA: Diagnosis not present

## 2018-03-01 DIAGNOSIS — Z79899 Other long term (current) drug therapy: Secondary | ICD-10-CM | POA: Diagnosis not present

## 2018-03-01 DIAGNOSIS — K760 Fatty (change of) liver, not elsewhere classified: Secondary | ICD-10-CM | POA: Diagnosis not present

## 2018-03-01 DIAGNOSIS — Z955 Presence of coronary angioplasty implant and graft: Secondary | ICD-10-CM

## 2018-03-01 DIAGNOSIS — K219 Gastro-esophageal reflux disease without esophagitis: Secondary | ICD-10-CM | POA: Diagnosis not present

## 2018-03-01 DIAGNOSIS — E559 Vitamin D deficiency, unspecified: Secondary | ICD-10-CM | POA: Diagnosis not present

## 2018-03-01 DIAGNOSIS — I2102 ST elevation (STEMI) myocardial infarction involving left anterior descending coronary artery: Secondary | ICD-10-CM

## 2018-03-01 DIAGNOSIS — I10 Essential (primary) hypertension: Secondary | ICD-10-CM | POA: Diagnosis not present

## 2018-03-01 DIAGNOSIS — I252 Old myocardial infarction: Secondary | ICD-10-CM | POA: Diagnosis not present

## 2018-03-01 DIAGNOSIS — Z7902 Long term (current) use of antithrombotics/antiplatelets: Secondary | ICD-10-CM | POA: Diagnosis not present

## 2018-03-04 ENCOUNTER — Encounter (HOSPITAL_COMMUNITY): Payer: BLUE CROSS/BLUE SHIELD

## 2018-03-04 ENCOUNTER — Encounter (HOSPITAL_COMMUNITY)
Admission: RE | Admit: 2018-03-04 | Discharge: 2018-03-04 | Disposition: A | Discharge status: Home / Self Care | Payer: BLUE CROSS/BLUE SHIELD | Source: Ambulatory Visit | Attending: Cardiology | Admitting: Cardiology

## 2018-03-04 ENCOUNTER — Encounter (HOSPITAL_COMMUNITY): Payer: Self-pay

## 2018-03-04 DIAGNOSIS — I10 Essential (primary) hypertension: Secondary | ICD-10-CM | POA: Diagnosis not present

## 2018-03-04 DIAGNOSIS — K760 Fatty (change of) liver, not elsewhere classified: Secondary | ICD-10-CM | POA: Diagnosis not present

## 2018-03-04 DIAGNOSIS — I2510 Atherosclerotic heart disease of native coronary artery without angina pectoris: Secondary | ICD-10-CM | POA: Diagnosis not present

## 2018-03-04 DIAGNOSIS — Z96651 Presence of right artificial knee joint: Secondary | ICD-10-CM | POA: Diagnosis not present

## 2018-03-04 DIAGNOSIS — Z955 Presence of coronary angioplasty implant and graft: Secondary | ICD-10-CM | POA: Diagnosis not present

## 2018-03-04 DIAGNOSIS — Z7902 Long term (current) use of antithrombotics/antiplatelets: Secondary | ICD-10-CM | POA: Diagnosis not present

## 2018-03-04 DIAGNOSIS — I2102 ST elevation (STEMI) myocardial infarction involving left anterior descending coronary artery: Secondary | ICD-10-CM

## 2018-03-04 DIAGNOSIS — E559 Vitamin D deficiency, unspecified: Secondary | ICD-10-CM | POA: Diagnosis not present

## 2018-03-04 DIAGNOSIS — Z7951 Long term (current) use of inhaled steroids: Secondary | ICD-10-CM | POA: Diagnosis not present

## 2018-03-04 DIAGNOSIS — E785 Hyperlipidemia, unspecified: Secondary | ICD-10-CM | POA: Diagnosis not present

## 2018-03-04 DIAGNOSIS — Z6841 Body Mass Index (BMI) 40.0 and over, adult: Secondary | ICD-10-CM | POA: Diagnosis not present

## 2018-03-04 DIAGNOSIS — Z7982 Long term (current) use of aspirin: Secondary | ICD-10-CM | POA: Diagnosis not present

## 2018-03-04 DIAGNOSIS — K219 Gastro-esophageal reflux disease without esophagitis: Secondary | ICD-10-CM | POA: Diagnosis not present

## 2018-03-04 DIAGNOSIS — Z79899 Other long term (current) drug therapy: Secondary | ICD-10-CM | POA: Diagnosis not present

## 2018-03-04 DIAGNOSIS — J45909 Unspecified asthma, uncomplicated: Secondary | ICD-10-CM | POA: Diagnosis not present

## 2018-03-04 DIAGNOSIS — I252 Old myocardial infarction: Secondary | ICD-10-CM | POA: Diagnosis not present

## 2018-03-06 ENCOUNTER — Encounter (HOSPITAL_COMMUNITY): Payer: BLUE CROSS/BLUE SHIELD

## 2018-03-07 ENCOUNTER — Telehealth: Payer: Self-pay | Admitting: Cardiology

## 2018-03-07 NOTE — Telephone Encounter (Signed)
New Message:       Pt states she is currently in [place] And needs to know should she still stay on her regular med schedule due to the hours being different.

## 2018-03-07 NOTE — Telephone Encounter (Signed)
Spoke to patient . Patient states she takes medication 6 am 12  6 pm eastern time. Informed her to stay on [place] time schedule  6 ,12 ,6 pac[place] . She verbalized understanding.

## 2018-03-07 NOTE — Progress Notes (Signed)
Cardiac Individual Treatment Plan  Patient Details  Name: Alyssa Phillips MRN: [id number] Date of Birth: 10-May-1955 Referring Provider:     CARDIAC REHAB PHASE II ORIENTATION from 01/01/2018 in [facility] CARDIAC REHAB  Referring Provider  [place], Peter, MD      Initial Encounter Date:    CARDIAC REHAB PHASE II ORIENTATION from 01/01/2018 in [facility] CARDIAC REHAB  Date  01/01/18      Visit Diagnosis: 10/31/17 STEMI  10/31/17 DES LAD  Patient's Home Medications on Admission:  Current Outpatient Medications:  .  acetaminophen (TYLENOL) 325 MG tablet, Take 325 mg by mouth every 6 (six) hours as needed., Disp: , Rfl:  .  aspirin EC 81 MG EC tablet, Take 1 tablet (81 mg total) by mouth daily., Disp: , Rfl:  .  atorvastatin (LIPITOR) 80 MG tablet, Take 1 tablet (80 mg total) by mouth daily at 6 PM., Disp: 30 tablet, Rfl: 5 .  benzonatate (TESSALON) 100 MG capsule, benzonatate 100 mg capsule, Disp: , Rfl:  .  Bepotastine Besilate (BEPREVE) 1.5 % SOLN, Place 1 drop into both eyes daily as needed. For itchy eyes, Disp: , Rfl:  .  budesonide-formoterol (SYMBICORT) 160-4.5 MCG/ACT inhaler, Inhale 2 puffs into the lungs 2 (two) times daily., Disp: , Rfl:  .  cholecalciferol (VITAMIN D) 1000 units tablet, Take 1,000 Units by mouth 2 (two) times daily., Disp: , Rfl:  .  diclofenac sodium (VOLTAREN) 1 % GEL, diclofenac 1 % topical gel, Disp: , Rfl:  .  levalbuterol (XOPENEX HFA) 45 MCG/ACT inhaler, Inhale 2 puffs into the lungs every 4 (four) hours as needed for wheezing., Disp: 1 Inhaler, Rfl: 0 .  levalbuterol (XOPENEX) 0.63 MG/3ML nebulizer solution, Take 0.63 mg by nebulization every 4 (four) hours as needed for wheezing or shortness of breath., Disp: , Rfl:  .  levocetirizine (XYZAL) 5 MG tablet, Take 5 mg by mouth every evening., Disp: , Rfl:  .  lisinopril (PRINIVIL,ZESTRIL) 5 MG tablet, Take 1 tablet (5 mg total) by mouth daily., Disp: 30 tablet, Rfl:  5 .  metoprolol succinate (TOPROL-XL) 50 MG 24 hr tablet, Take 1 tablet (50 mg total) by mouth daily. Take with or immediately following a meal., Disp: 30 tablet, Rfl: 5 .  mometasone (NASONEX) 50 MCG/ACT nasal spray, Place 2 sprays into the nose every morning., Disp: , Rfl:  .  montelukast (SINGULAIR) 10 MG tablet, Take 10 mg by mouth at bedtime., Disp: , Rfl:  .  Multiple Vitamin (MULTIVITAMIN) tablet, Take 1 tablet by mouth daily., Disp: , Rfl:  .  nitroGLYCERIN (NITROSTAT) 0.4 MG SL tablet, Place 1 tablet (0.4 mg total) under the tongue every 5 (five) minutes as needed for chest pain., Disp: 25 tablet, Rfl: 2 .  ticagrelor (BRILINTA) 90 MG TABS tablet, Take 1 tablet (90 mg total) by mouth 2 (two) times daily., Disp: 180 tablet, Rfl: 3  Past Medical History: Past Medical History:  Diagnosis Date  . Allergic rhinitis   . Arthritis    Osteoarthrits-knees-hx. RTKA  . Asthma    environmental agents induced asthma  . Coronary artery disease   . Family history of adverse reaction to anesthesia    daughter very sensitive to anesthesia  . GERD (gastroesophageal reflux disease)   . Heart murmur    hx. mitral valve proplapse-mostly asymptomatic-->not apprectiaed n echo  . Hepatic steatosis 03/05/2008   [place]  . Hyperlipidemia   . Hypertension   . Morbid obesity due to  excess calories (HCC)   . STEMI (ST elevation myocardial infarction) (HCC) 10/2017   LAD treated with DESx1  . UTI (urinary tract infection)   . Vitamin D deficiency   . Vitamin D deficiency     Tobacco Use: Social History   Tobacco Use  Smoking Status Never Smoker  Smokeless Tobacco Never Used    Labs: Recent Review Flowsheet Data    Labs for ITP Cardiac and Pulmonary Rehab Latest Ref Rng & Units 04/07/2016 10/31/2016 11/01/2017 12/04/2017 01/02/2018   Cholestrol 100 - 199 mg/dL 657(Q) - 469(G) - 295   LDLCALC 0 - 99 mg/dL 284 132 440(N) - 44   HDL >39 mg/dL 44 47 49 - 02(V)   Trlycerides 0 - 149 mg/dL 253 664 403 -  474   Hemoglobin A1c 4.6 - 6.5 % - - - 5.6 -   TCO2 22 - 32 mmol/L - - 22 - -      Capillary Blood Glucose: No results found for: GLUCAP   Exercise Target Goals: Exercise Program Goal: Individual exercise prescription set using results from initial 6 min walk test and THRR while considering  patient's activity barriers and safety.   Exercise Prescription Goal: Initial exercise prescription builds to 30-45 minutes a day of aerobic activity, 2-3 days per week.  Home exercise guidelines will be given to patient during program as part of exercise prescription that the participant will acknowledge.  Activity Barriers & Risk Stratification: Activity Barriers & Cardiac Risk Stratification - 01/01/18 0816      Activity Barriers & Cardiac Risk Stratification   Activity Barriers  Left Knee Replacement;Right Knee Replacement;Arthritis;Other (comment)    Comments  Bursitis left shoulder, arthritis top of left foot, bilateral heel spurs    Cardiac Risk Stratification  High       6 Minute Walk: 6 Minute Walk    Row Name 01/01/18 0829         6 Minute Walk   Phase  Initial     Distance  1204 feet     Walk Time  6 minutes     # of Rest Breaks  0     MPH  2.28     METS  2.24     RPE  11     VO2 Peak  7.84     Symptoms  Yes (comment)     Comments  Patient c/o feeling "breathy" after walk test.     Resting HR  100 bpm     Resting BP  98/60     Resting Oxygen Saturation   96 %     Exercise Oxygen Saturation  during 6 min walk  97 %     Max Ex. HR  131 bpm     Max Ex. BP  124/80     2 Minute Post BP  104/60        Oxygen Initial Assessment:   Oxygen Re-Evaluation:   Oxygen Discharge (Final Oxygen Re-Evaluation):   Initial Exercise Prescription: Initial Exercise Prescription - 01/01/18 1100      Date of Initial Exercise RX and Referring Provider   Date  01/01/18    Referring Provider  [place], Peter, MD      NuStep   Level  3    SPM  85    Minutes  10    METs  2.5       Arm Ergometer   Level  2    Minutes  10    METs  2.5      Track   Laps  10    Minutes  10    METs  2.74      Prescription Details   Frequency (times per week)  3    Duration  Progress to 30 minutes of continuous aerobic without signs/symptoms of physical distress      Intensity   THRR 40-80% of Max Heartrate  63-126    Ratings of Perceived Exertion  11-13    Perceived Dyspnea  0-4      Progression   Progression  Continue to progress workloads to maintain intensity without signs/symptoms of physical distress.      Resistance Training   Training Prescription  Yes    Weight  3lbs    Reps  10-15       Perform Capillary Blood Glucose checks as needed.  Exercise Prescription Changes: Exercise Prescription Changes    Row Name 01/07/18 1600 01/16/18 1600 01/28/18 1600 02/11/18 0900 02/25/18 1348     Response to Exercise   Blood Pressure (Admit)  100/60  102/76  104/64  110/68  110/68   Blood Pressure (Exercise)  114/70  108/76  128/62  120/68  120/70   Blood Pressure (Exit)  104/60  92/60  104/60  100/62  102/72   Heart Rate (Admit)  99 bpm  103 bpm  100 bpm  99 bpm  90 bpm   Heart Rate (Exercise)  129 bpm  129 bpm  124 bpm  130 bpm  129 bpm   Heart Rate (Exit)  97 bpm  90 bpm  87 bpm  95 bpm  88 bpm   Rating of Perceived Exertion (Exercise)  11  11  11  12  11    Perceived Dyspnea (Exercise)  0  0  0  0  0   Symptoms  None  None  None  None  None   Comments  Pt's first day of exercise   -  None  None  None   Duration  Progress to 30 minutes of  aerobic without signs/symptoms of physical distress  Continue with 30 min of aerobic exercise without signs/symptoms of physical distress.  Continue with 30 min of aerobic exercise without signs/symptoms of physical distress.  Continue with 30 min of aerobic exercise without signs/symptoms of physical distress.  Continue with 30 min of aerobic exercise without signs/symptoms of physical distress.   Intensity  THRR unchanged  THRR  unchanged  THRR unchanged  THRR unchanged  THRR unchanged     Progression   Progression  Continue to progress workloads to maintain intensity without signs/symptoms of physical distress.  Continue to progress workloads to maintain intensity without signs/symptoms of physical distress.  Continue to progress workloads to maintain intensity without signs/symptoms of physical distress.  Continue to progress workloads to maintain intensity without signs/symptoms of physical distress.  Continue to progress workloads to maintain intensity without signs/symptoms of physical distress.   Average METs  2.3  3.13  2.7  2.96  2.96     Resistance Training   Training Prescription  Yes  No  Yes  Yes  Yes   Weight  3lbs  -  3lbs  3lbs  3lbs   Reps  10-15  -  10-15  10-15  10-15   Time  10 Minutes  -  10 Minutes  10 Minutes  10 Minutes     Interval Training   Interval Training  No  No  No  No  No     NuStep   Level  3  3  4  4  4    SPM  85  100  100  100  100   Minutes  10  10  10  10  10    METs  1.6  4.2  2.7  3  3.1     Arm Ergometer   Level  2  2  2  2  2    Minutes  10  10  10  10  10    METs  2.7  2.2  1.95  1.96  1.96     Track   Laps  11  13  14  16  16    Minutes  10  10  10  10  10    METs  2.91  3.3  3.45  3.76  3.76     Home Exercise Plan   Plans to continue exercise at  -  -  -  Home (comment) Walking  Home (comment) Walking   Frequency  -  -  -  Add 2 additional days to program exercise sessions.  Add 2 additional days to program exercise sessions.   Initial Home Exercises Provided  -  -  -  01/30/18  01/30/18   Row Name 03/04/18 1400             Response to Exercise   Blood Pressure (Admit)  110/64       Blood Pressure (Exercise)  120/66       Blood Pressure (Exit)  108/68       Heart Rate (Admit)  94 bpm       Heart Rate (Exercise)  133 bpm       Heart Rate (Exit)  94 bpm       Rating of Perceived Exertion (Exercise)  10       Perceived Dyspnea (Exercise)  0       Symptoms   None        Comments  None       Duration  Progress to 45 minutes of aerobic exercise without signs/symptoms of physical distress       Intensity  THRR unchanged         Progression   Progression  Continue to progress workloads to maintain intensity without signs/symptoms of physical distress.       Average METs  3.4         Resistance Training   Training Prescription  Yes       Weight  3lbs       Reps  10-15       Time  10 Minutes         Interval Training   Interval Training  No         NuStep   Level  5       SPM  100       Minutes  10       METs  3.2         Arm Ergometer   Level  2       Minutes  10       METs  2         Track   Laps  16       Minutes  10       METs  3.76         Home Exercise Plan   Plans to continue exercise at  Home (comment)  Walking       Frequency  Add 2 additional days to program exercise sessions.       Initial Home Exercises Provided  01/30/18          Exercise Comments: Exercise Comments    Row Name 01/07/18 1622 01/07/18 1626 01/30/18 1412 03/04/18 1410     Exercise Comments  Today, was pt's first day of exercise. Pt responded well to exercise workloads. Will continue to monitor and progress pt as tolerated.   Today, was pt's first day of exercise. Pt responded well to exercise workloads. Will continue to monitor and progress pt as tolerated.   Reviewed HEP with pt. Pt verbalized understanding. Will continue to monitor and progress pt as tolerated.   Reveiwed METs and Goals with pt. Pt is continuing to respond well to workload increases and put forth great effort. Will continue to monitor pt.        Exercise Goals and Review: Exercise Goals    Row Name 01/01/18 0820             Exercise Goals   Increase Physical Activity  Yes       Intervention  Provide advice, education, support and counseling about physical activity/exercise needs.;Develop an individualized exercise prescription for aerobic and resistive training based on  initial evaluation findings, risk stratification, comorbidities and participant's personal goals.       Expected Outcomes  Short Term: Attend rehab on a regular basis to increase amount of physical activity.;Long Term: Exercising regularly at least 3-5 days a week.;Long Term: Add in home exercise to make exercise part of routine and to increase amount of physical activity.       Increase Strength and Stamina  Yes       Intervention  Provide advice, education, support and counseling about physical activity/exercise needs.;Develop an individualized exercise prescription for aerobic and resistive training based on initial evaluation findings, risk stratification, comorbidities and participant's personal goals.       Expected Outcomes  Short Term: Increase workloads from initial exercise prescription for resistance, speed, and METs.;Short Term: Perform resistance training exercises routinely during rehab and add in resistance training at home;Long Term: Improve cardiorespiratory fitness, muscular endurance and strength as measured by increased METs and functional capacity ( )       Able to understand and use rate of perceived exertion (RPE) scale  Yes       Intervention  Provide education and explanation on how to use RPE scale       Expected Outcomes  Short Term: Able to use RPE daily in rehab to express subjective intensity level;Long Term:  Able to use RPE to guide intensity level when exercising independently       Knowledge and understanding of Target Heart Rate Range (THRR)  Yes       Intervention  Provide education and explanation of THRR including how the numbers were predicted and where they are located for reference       Expected Outcomes  Short Term: Able to state/look up THRR;Long Term: Able to use THRR to govern intensity when exercising independently;Short Term: Able to use daily as guideline for intensity in rehab       Able to check pulse independently  Yes       Intervention  Provide  education and demonstration on how to check pulse in carotid and radial arteries.;Review the importance of being able to check your own pulse for safety during independent exercise  Expected Outcomes  Short Term: Able to explain why pulse checking is important during independent exercise;Long Term: Able to check pulse independently and accurately       Understanding of Exercise Prescription  Yes       Intervention  Provide education, explanation, and written materials on patient's individual exercise prescription       Expected Outcomes  Short Term: Able to explain program exercise prescription;Long Term: Able to explain home exercise prescription to exercise independently          Exercise Goals Re-Evaluation : Exercise Goals Re-Evaluation    Row Name 01/30/18 1408 03/04/18 1411           Exercise Goal Re-Evaluation   Exercise Goals Review  Increase Physical Activity;Able to understand and use rate of perceived exertion (RPE) scale;Knowledge and understanding of Target Heart Rate Range (THRR);Understanding of Exercise Prescription;Increase Strength and Stamina;Able to check pulse independently  Increase Physical Activity;Understanding of Exercise Prescription      Comments  Reveiwed HEP with pt. Also reviewed THRR, RPE Scale, weather conditions, endpoints of exercise, NTG use, warmup and cool down.   Reviewed METs and Goals with pt. Pt is putting for great effort while in rehab. Pt has increased her walking distance on the track while walking for 10 minutes. Pt is now able to get 16 laps. Pt also invested in new walking shoes and states that has really helped her while exercising.       Expected Outcomes  Pt will plan to continue to walk 2-3 days a week for 15 minutes. Pt will gradually add a minute to progress to 30 minutes of walking. Pt does have rower and bike at home and will use those from time to time. Will continue to monitor and progress as tolerated.   Pt is continuing to walk for  exercise 2-3 days a week for 15 minutes. Pt will continue to increase cardiovascular strength. Will continue to monitor and progress pt as tolerated.          Discharge Exercise Prescription (Final Exercise Prescription Changes): Exercise Prescription Changes - 03/04/18 1400      Response to Exercise   Blood Pressure (Admit)  110/64    Blood Pressure (Exercise)  120/66    Blood Pressure (Exit)  108/68    Heart Rate (Admit)  94 bpm    Heart Rate (Exercise)  133 bpm    Heart Rate (Exit)  94 bpm    Rating of Perceived Exertion (Exercise)  10    Perceived Dyspnea (Exercise)  0    Symptoms  None     Comments  None    Duration  Progress to 45 minutes of aerobic exercise without signs/symptoms of physical distress    Intensity  THRR unchanged      Progression   Progression  Continue to progress workloads to maintain intensity without signs/symptoms of physical distress.    Average METs  3.4      Resistance Training   Training Prescription  Yes    Weight  3lbs    Reps  10-15    Time  10 Minutes      Interval Training   Interval Training  No      NuStep   Level  5    SPM  100    Minutes  10    METs  3.2      Arm Ergometer   Level  2    Minutes  10    METs  2      Track   Laps  16    Minutes  10    METs  3.76      Home Exercise Plan   Plans to continue exercise at  Home (comment)   Walking   Frequency  Add 2 additional days to program exercise sessions.    Initial Home Exercises Provided  01/30/18       Nutrition:  Target Goals: Understanding of nutrition guidelines, daily intake of sodium 1500mg , cholesterol 200mg , calories 30% from fat and 7% or less from saturated fats, daily to have 5 or more servings of fruits and vegetables.  Biometrics: Pre Biometrics - 01/01/18 1155      Pre Biometrics   Height  5' 8.25" (1.734 m)    Weight  (!) 141.9 kg    Waist Circumference  50.5 inches    Hip Circumference  60 inches    Waist to Hip Ratio  0.84 %    BMI  (Calculated)  47.19    Triceps Skinfold  60 mm    % Body Fat  57.7 %    Grip Strength  36 kg    Flexibility  14 in    Single Leg Stand  4.18 seconds        Nutrition Therapy Plan and Nutrition Goals: Nutrition Therapy & Goals - 01/01/18 0916      Nutrition Therapy   Diet  heart healthy, carb modified      Personal Nutrition Goals   Nutrition Goal  Pt to identify and limit food sources of saturated fat, trans fat, refined carbohydrates and sodium    Personal Goal #2  Pt to identify food quantities necessary to achieve weight loss of 6-24 lbs. at graduation from cardiac rehab.    Personal Goal #3  Pt able to name foods that affect blood glucose      Intervention Plan   Intervention  Prescribe, educate and counsel regarding individualized specific dietary modifications aiming towards targeted core components such as weight, hypertension, lipid management, diabetes, heart failure and other comorbidities.    Expected Outcomes  Short Term Goal: Understand basic principles of dietary content, such as calories, fat, sodium, cholesterol and nutrients.;Long Term Goal: Adherence to prescribed nutrition plan.       Nutrition Assessments: Nutrition Assessments - 01/01/18 0917      MEDFICTS Scores   Pre Score  12       Nutrition Goals Re-Evaluation:   Nutrition Goals Re-Evaluation:   Nutrition Goals Discharge (Final Nutrition Goals Re-Evaluation):   Psychosocial: Target Goals: Acknowledge presence or absence of significant depression and/or stress, maximize coping skills, provide positive support system. Participant is able to verbalize types and ability to use techniques and skills needed for reducing stress and depression.  Initial Review & Psychosocial Screening: Initial Psych Review & Screening - 01/01/18 1221      Initial Review   Current issues with  None Identified      Family Dynamics   Good Support System?  Yes      Barriers   Psychosocial barriers to participate  in program  There are no identifiable barriers or psychosocial needs.      Screening Interventions   Interventions  Encouraged to exercise       Quality of Life Scores: Quality of Life - 01/01/18 1030      Quality of Life   Select  Quality of Life      Quality of Life Scores   Health/Function Pre  23.6 %    Socioeconomic Pre  29.25 %    Psych/Spiritual Pre  26.57 %    Family Pre  28.8 %    GLOBAL Pre  26.23 %      Scores of 19 and below usually indicate a poorer quality of life in these areas.  A difference of  2-3 points is a clinically meaningful difference.  A difference of 2-3 points in the total score of the Quality of Life Index has been associated with significant improvement in overall quality of life, self-image, physical symptoms, and general health in studies assessing change in quality of life.  PHQ-9: Recent Review Flowsheet Data    Depression screen [employer] 2/9 01/07/2018 12/04/2017   Decreased Interest 0 0   Down, Depressed, Hopeless 0 0   PHQ - 2 Score 0 0     Interpretation of Total Score  Total Score Depression Severity:  1-4 = Minimal depression, 5-9 = Mild depression, 10-14 = Moderate depression, 15-19 = Moderately severe depression, 20-27 = Severe depression   Psychosocial Evaluation and Intervention: Psychosocial Evaluation - 01/07/18 0900      Psychosocial Evaluation & Interventions   Interventions  Encouraged to exercise with the program and follow exercise prescription    Comments  No psychosocial needs identified. Pt enjoys drawing, painting, and playing with her grandchildren.     Expected Outcomes  [place] will continue to exhibit a positive outlook.     Continue Psychosocial Services   No Follow up required       Psychosocial Re-Evaluation: Psychosocial Re-Evaluation    Row Name 01/17/18 0732 02/08/18 1354 03/04/18 1528         Psychosocial Re-Evaluation   Current issues with  None Identified  None Identified  None Identified     Comments  No  interventions necessary.  No interventions necessary.  No interventions necessary.     Expected Outcomes  Alyssa Phillips will maintain a positive outlook.  Alyssa Phillips will maintain a positive outlook with good coping skills.   Alyssa Phillips will maintain a positive outlook with good coping skills.      Interventions  Encouraged to attend Cardiac Rehabilitation for the exercise  Encouraged to attend Cardiac Rehabilitation for the exercise  Encouraged to attend Cardiac Rehabilitation for the exercise     Continue Psychosocial Services   No Follow up required  No Follow up required  No Follow up required        Psychosocial Discharge (Final Psychosocial Re-Evaluation): Psychosocial Re-Evaluation - 03/04/18 1528      Psychosocial Re-Evaluation   Current issues with  None Identified    Comments  No interventions necessary.    Expected Outcomes  Alyssa Phillips will maintain a positive outlook with good coping skills.     Interventions  Encouraged to attend Cardiac Rehabilitation for the exercise    Continue Psychosocial Services   No Follow up required       Vocational Rehabilitation: Provide vocational rehab assistance to qualifying candidates.   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 Participant will state understanding/return demonstration of topics presented.  Learning Barriers/Preferences: Learning Barriers/Preferences - 01/01/18 1225      Learning Barriers/Preferences   Learning Barriers  Sight   Wears  glasses   Learning Preferences  Pictoral;Skilled Demonstration       Education Topics: Count Your Pulse:  -Group instruction provided by verbal instruction, demonstration, patient participation and written materials to  support subject.  Instructors address importance of being able to find your pulse and how to count your pulse when at home without a heart monitor.  Patients get hands on experience counting their pulse with staff help and individually.   CARDIAC REHAB PHASE II EXERCISE from 02/08/2018 in [facility] CARDIAC REHAB  Date  02/08/18  Instruction Review Code  3- Needs Reinforcement      Heart Attack, Angina, and Risk Factor Modification:  -Group instruction provided by verbal instruction, video, and written materials to support subject.  Instructors address signs and symptoms of angina and heart attacks.    Also discuss risk factors for heart disease and how to make changes to improve heart health risk factors.   CARDIAC REHAB PHASE II EXERCISE from 02/08/2018 in [facility] CARDIAC REHAB  Date  01/16/18 [Patient unable to stay for class, handout given.]  Instruction Review Code  2- Demonstrated Understanding      Functional Fitness:  -Group instruction provided by verbal instruction, demonstration, patient participation, and written materials to support subject.  Instructors address safety measures for doing things around the house.  Discuss how to get up and down off the floor, how to pick things up properly, how to safely get out of a chair without assistance, and balance training.   Meditation and Mindfulness:  -Group instruction provided by verbal instruction, patient participation, and written materials to support subject.  Instructor addresses importance of mindfulness and meditation practice to help reduce stress and improve awareness.  Instructor also leads participants through a meditation exercise.    CARDIAC REHAB PHASE II EXERCISE from 02/08/2018 in [facility] CARDIAC REHAB  Date  02/06/18  Educator  Theda Belfast  Instruction Review Code  2- Demonstrated Understanding      Stretching for Flexibility and Mobility:  -Group  instruction provided by verbal instruction, patient participation, and written materials to support subject.  Instructors lead participants through series of stretches that are designed to increase flexibility thus improving mobility.  These stretches are additional exercise for major muscle groups that are typically performed during regular warm up and cool down.   Hands Only CPR:  -Group verbal, video, and participation provides a basic overview of AHA guidelines for community CPR. Role-play of emergencies allow participants the opportunity to practice calling for help and chest compression technique with discussion of AED use.   Hypertension: -Group verbal and written instruction that provides a basic overview of hypertension including the most recent diagnostic guidelines, risk factor reduction with self-care instructions and medication management.    Nutrition I class: Heart Healthy Eating:  -Group instruction provided by PowerPoint slides, verbal discussion, and written materials to support subject matter. The instructor gives an explanation and review of the Therapeutic Lifestyle Changes diet recommendations, which includes a discussion on lipid goals, dietary fat, sodium, fiber, plant stanol/sterol esters, sugar, and the components of a well-balanced, healthy diet.   Nutrition II class: Lifestyle Skills:  -Group instruction provided by PowerPoint slides, verbal discussion, and written materials to support subject matter. The instructor gives an explanation and review of label reading, grocery shopping for heart health, heart healthy recipe modifications, and ways to make healthier choices when eating out.   Diabetes Question & Answer:  -Group instruction provided by PowerPoint  slides, verbal discussion, and written materials to support subject matter. The instructor gives an explanation and review of diabetes co-morbidities, pre- and post-prandial blood glucose goals, pre-exercise blood  glucose goals, signs, symptoms, and treatment of hypoglycemia and hyperglycemia, and foot care basics.   Diabetes Blitz:  -Group instruction provided by PowerPoint slides, verbal discussion, and written materials to support subject matter. The instructor gives an explanation and review of the physiology behind type 1 and type 2 diabetes, diabetes medications and rational behind using different medications, pre- and post-prandial blood glucose recommendations and Hemoglobin A1c goals, diabetes diet, and exercise including blood glucose guidelines for exercising safely.    Portion Distortion:  -Group instruction provided by PowerPoint slides, verbal discussion, written materials, and food models to support subject matter. The instructor gives an explanation of serving size versus portion size, changes in portions sizes over the last 20 years, and what consists of a serving from each food group.   Stress Management:  -Group instruction provided by verbal instruction, video, and written materials to support subject matter.  Instructors review role of stress in heart disease and how to cope with stress positively.     Exercising on Your Own:  -Group instruction provided by verbal instruction, power point, and written materials to support subject.  Instructors discuss benefits of exercise, components of exercise, frequency and intensity of exercise, and end points for exercise.  Also discuss use of nitroglycerin and activating EMS.  Review options of places to exercise outside of rehab.  Review guidelines for sex with heart disease.   Cardiac Drugs I:  -Group instruction provided by verbal instruction and written materials to support subject.  Instructor reviews cardiac drug classes: antiplatelets, anticoagulants, beta blockers, and statins.  Instructor discusses reasons, side effects, and lifestyle considerations for each drug class.   Cardiac Drugs II:  -Group instruction provided by verbal  instruction and written materials to support subject.  Instructor reviews cardiac drug classes: angiotensin converting enzyme inhibitors (ACE-I), angiotensin II receptor blockers (ARBs), nitrates, and calcium channel blockers.  Instructor discusses reasons, side effects, and lifestyle considerations for each drug class.   CARDIAC REHAB PHASE II EXERCISE from 02/08/2018 in [facility] CARDIAC REHAB  Date  01/23/18  Educator  Pharmacist  Instruction Review Code  2- Demonstrated Understanding      Anatomy and Physiology of the Circulatory System:  Group verbal and written instruction and models provide basic cardiac anatomy and physiology, with the coronary electrical and arterial systems. Review of: AMI, Angina, Valve disease, Heart Failure, Peripheral Artery Disease, Cardiac Arrhythmia, Pacemakers, and the ICD.   CARDIAC REHAB PHASE II EXERCISE from 01/09/2018 in [facility] CARDIAC REHAB  Date  01/09/18  Instruction Review Code  2- Demonstrated Understanding      Other Education:  -Group or individual verbal, written, or video instructions that support the educational goals of the cardiac rehab program.   Holiday Eating Survival Tips:  -Group instruction provided by PowerPoint slides, verbal discussion, and written materials to support subject matter. The instructor gives patients tips, tricks, and techniques to help them not only survive but enjoy the holidays despite the onslaught of food that accompanies the holidays.   Knowledge Questionnaire Score: Knowledge Questionnaire Score - 01/01/18 1027      Knowledge Questionnaire Score   Pre Score  21/24        Core Components/Risk Factors/Patient Goals at Admission: Personal Goals and Risk Factors at Admission - 01/01/18 1228  Core Components/Risk Factors/Patient Goals on Admission    Weight Management  Obesity;Yes    Intervention  Weight Management: Develop a combined nutrition and exercise  program designed to reach desired caloric intake, while maintaining appropriate intake of nutrient and fiber, sodium and fats, and appropriate energy expenditure required for the weight goal.;Weight Management: Provide education and appropriate resources to help participant work on and attain dietary goals.;Weight Management/Obesity: Establish reasonable short term and long term weight goals.;Obesity: Provide education and appropriate resources to help participant work on and attain dietary goals.    Admit Weight  312 lb 13.3 oz (141.9 kg)    Expected Outcomes  Short Term: Continue to assess and modify interventions until short term weight is achieved;Long Term: Adherence to nutrition and physical activity/exercise program aimed toward attainment of established weight goal;Weight Maintenance: Understanding of the daily nutrition guidelines, which includes 25-35% calories from fat, 7% or less cal from saturated fats, less than 200mg  cholesterol, less than 1.5gm of sodium, & 5 or more servings of fruits and vegetables daily;Weight Loss: Understanding of general recommendations for a balanced deficit meal plan, which promotes 1-2 lb weight loss per week and includes a negative energy balance of [phone] kcal/d;Understanding recommendations for meals to include 15-35% energy as protein, 25-35% energy from fat, 35-60% energy from carbohydrates, less than 200mg  of dietary cholesterol, 20-35 gm of total fiber daily;Understanding of distribution of calorie intake throughout the day with the consumption of 4-5 meals/snacks    Hypertension  Yes    Intervention  Provide education on lifestyle modifcations including regular physical activity/exercise, weight management, moderate sodium restriction and increased consumption of fresh fruit, vegetables, and low fat dairy, alcohol moderation, and smoking cessation.;Monitor prescription use compliance.    Expected Outcomes  Short Term: Continued assessment and intervention  until BP is < 140/28mm HG in hypertensive participants. < 130/26mm HG in hypertensive participants with diabetes, heart failure or chronic kidney disease.;Long Term: Maintenance of blood pressure at goal levels.    Lipids  Yes    Intervention  Provide education and support for participant on nutrition & aerobic/resistive exercise along with prescribed medications to achieve LDL 70mg , HDL >40mg .    Expected Outcomes  Short Term: Participant states understanding of desired cholesterol values and is compliant with medications prescribed. Participant is following exercise prescription and nutrition guidelines.;Long Term: Cholesterol controlled with medications as prescribed, with individualized exercise RX and with personalized nutrition plan. Value goals: LDL < 70mg , HDL > 40 mg.    Stress  Yes    Intervention  Offer individual and/or small group education and counseling on adjustment to heart disease, stress management and health-related lifestyle change. Teach and support self-help strategies.;Refer participants experiencing significant psychosocial distress to appropriate mental health specialists for further evaluation and treatment. When possible, include family members and significant others in education/counseling sessions.    Expected Outcomes  Short Term: Participant demonstrates changes in health-related behavior, relaxation and other stress management skills, ability to obtain effective social support, and compliance with psychotropic medications if prescribed.;Long Term: Emotional wellbeing is indicated by absence of clinically significant psychosocial distress or social isolation.       Core Components/Risk Factors/Patient Goals Review:  Goals and Risk Factor Review    Row Name 01/07/18 0901 01/17/18 0734 02/08/18 1354 03/04/18 1528       Core Components/Risk Factors/Patient Goals Review   Personal Goals Review  Weight Management/Obesity;Lipids;Hypertension;Stress  Weight  Management/Obesity;Lipids;Hypertension;Stress  Weight Management/Obesity;Lipids;Hypertension;Stress  Weight Management/Obesity;Lipids;Hypertension;Stress    Review  Pt with multiple  CAD RFs willing to participate in CR exercise.  Pt would like to lose weight and get stronger.   Pt with multiple CAD RFs willing to participate in CR exercise.  Alyssa Phillips is off to a great start. She is tolerating exercise well.   Pt with multiple CAD RFs willing to participate in CR exercise.  Alyssa Phillips continues to tolerate exercise well.  She has great attendance.   Pt with multiple CAD RFs willing to participate in CR exercise.  Alyssa Phillips feels that she is increasing her stamina.  She has great attendance but plans to be out to visit with family.     Expected Outcomes  Pt will continue to participate in CR exercise, nutrition, and lifestyle modification opportunities.   Pt will continue to participate in CR exercise, nutrition, and lifestyle modification opportunities.   Pt will continue to participate in CR exercise, nutrition, and lifestyle modification opportunities.   Pt will continue to participate in CR exercise, nutrition, and lifestyle modification opportunities.        Core Components/Risk Factors/Patient Goals at Discharge (Final Review):  Goals and Risk Factor Review - 03/04/18 1528      Core Components/Risk Factors/Patient Goals Review   Personal Goals Review  Weight Management/Obesity;Lipids;Hypertension;Stress    Review  Pt with multiple CAD RFs willing to participate in CR exercise.  Alyssa Phillips feels that she is increasing her stamina.  She has great attendance but plans to be out to visit with family.     Expected Outcomes  Pt will continue to participate in CR exercise, nutrition, and lifestyle modification opportunities.        ITP Comments: ITP Comments    Row Name 01/01/18 0744 01/07/18 0858 01/17/18 0730 02/08/18 1353 03/04/18 1526   ITP Comments  Medical Director- Dr. Armanda Magicraci Turner, MD  Pt started exercise  today and tolerated it well.   30 Day ITP Review.  Alyssa Phillips is off to a great start with exercise. VSS.  30 Day ITP Review.  Alyssa Phillips is tolerating exercise well. She has great attendance. She continues to lose weigt.   30 Day ITP Review.  Alyssa Phillips is tolerating exercise well.  She feels that she is increasing her stamina.  She has great attendance but plans to be out to visit with family.       Comments: See ITP Comments.

## 2018-03-08 ENCOUNTER — Encounter (HOSPITAL_COMMUNITY): Payer: BLUE CROSS/BLUE SHIELD

## 2018-03-11 ENCOUNTER — Encounter (HOSPITAL_COMMUNITY): Payer: BLUE CROSS/BLUE SHIELD

## 2018-03-13 ENCOUNTER — Encounter (HOSPITAL_COMMUNITY): Payer: BLUE CROSS/BLUE SHIELD

## 2018-03-13 ENCOUNTER — Encounter (HOSPITAL_COMMUNITY)
Admission: RE | Admit: 2018-03-13 | Discharge: 2018-03-13 | Disposition: A | Discharge status: Home / Self Care | Payer: BLUE CROSS/BLUE SHIELD | Source: Ambulatory Visit | Attending: Cardiology | Admitting: Cardiology

## 2018-03-13 DIAGNOSIS — E785 Hyperlipidemia, unspecified: Secondary | ICD-10-CM | POA: Diagnosis not present

## 2018-03-13 DIAGNOSIS — Z7902 Long term (current) use of antithrombotics/antiplatelets: Secondary | ICD-10-CM | POA: Diagnosis not present

## 2018-03-13 DIAGNOSIS — Z96651 Presence of right artificial knee joint: Secondary | ICD-10-CM | POA: Diagnosis not present

## 2018-03-13 DIAGNOSIS — Z955 Presence of coronary angioplasty implant and graft: Secondary | ICD-10-CM

## 2018-03-13 DIAGNOSIS — Z7982 Long term (current) use of aspirin: Secondary | ICD-10-CM | POA: Diagnosis not present

## 2018-03-13 DIAGNOSIS — I2102 ST elevation (STEMI) myocardial infarction involving left anterior descending coronary artery: Secondary | ICD-10-CM

## 2018-03-13 DIAGNOSIS — E559 Vitamin D deficiency, unspecified: Secondary | ICD-10-CM | POA: Diagnosis not present

## 2018-03-13 DIAGNOSIS — I2510 Atherosclerotic heart disease of native coronary artery without angina pectoris: Secondary | ICD-10-CM | POA: Diagnosis not present

## 2018-03-13 DIAGNOSIS — Z7951 Long term (current) use of inhaled steroids: Secondary | ICD-10-CM | POA: Diagnosis not present

## 2018-03-13 DIAGNOSIS — K219 Gastro-esophageal reflux disease without esophagitis: Secondary | ICD-10-CM | POA: Diagnosis not present

## 2018-03-13 DIAGNOSIS — I10 Essential (primary) hypertension: Secondary | ICD-10-CM | POA: Diagnosis not present

## 2018-03-13 DIAGNOSIS — Z6841 Body Mass Index (BMI) 40.0 and over, adult: Secondary | ICD-10-CM | POA: Diagnosis not present

## 2018-03-13 DIAGNOSIS — K760 Fatty (change of) liver, not elsewhere classified: Secondary | ICD-10-CM | POA: Diagnosis not present

## 2018-03-13 DIAGNOSIS — J45909 Unspecified asthma, uncomplicated: Secondary | ICD-10-CM | POA: Diagnosis not present

## 2018-03-13 DIAGNOSIS — I252 Old myocardial infarction: Secondary | ICD-10-CM | POA: Diagnosis not present

## 2018-03-13 DIAGNOSIS — Z79899 Other long term (current) drug therapy: Secondary | ICD-10-CM | POA: Diagnosis not present

## 2018-03-15 ENCOUNTER — Encounter (HOSPITAL_COMMUNITY): Payer: BLUE CROSS/BLUE SHIELD

## 2018-03-18 ENCOUNTER — Encounter (HOSPITAL_COMMUNITY): Payer: BLUE CROSS/BLUE SHIELD

## 2018-03-18 ENCOUNTER — Encounter (HOSPITAL_COMMUNITY)
Admission: RE | Admit: 2018-03-18 | Discharge: 2018-03-18 | Disposition: A | Discharge status: Home / Self Care | Payer: BLUE CROSS/BLUE SHIELD | Source: Ambulatory Visit | Attending: Cardiology | Admitting: Cardiology

## 2018-03-18 DIAGNOSIS — I252 Old myocardial infarction: Secondary | ICD-10-CM | POA: Insufficient documentation

## 2018-03-18 DIAGNOSIS — Z7951 Long term (current) use of inhaled steroids: Secondary | ICD-10-CM | POA: Diagnosis not present

## 2018-03-18 DIAGNOSIS — I10 Essential (primary) hypertension: Secondary | ICD-10-CM | POA: Insufficient documentation

## 2018-03-18 DIAGNOSIS — Z96651 Presence of right artificial knee joint: Secondary | ICD-10-CM | POA: Diagnosis not present

## 2018-03-18 DIAGNOSIS — E785 Hyperlipidemia, unspecified: Secondary | ICD-10-CM | POA: Diagnosis not present

## 2018-03-18 DIAGNOSIS — Z7902 Long term (current) use of antithrombotics/antiplatelets: Secondary | ICD-10-CM | POA: Insufficient documentation

## 2018-03-18 DIAGNOSIS — Z7982 Long term (current) use of aspirin: Secondary | ICD-10-CM | POA: Insufficient documentation

## 2018-03-18 DIAGNOSIS — J45909 Unspecified asthma, uncomplicated: Secondary | ICD-10-CM | POA: Insufficient documentation

## 2018-03-18 DIAGNOSIS — K760 Fatty (change of) liver, not elsewhere classified: Secondary | ICD-10-CM | POA: Insufficient documentation

## 2018-03-18 DIAGNOSIS — Z79899 Other long term (current) drug therapy: Secondary | ICD-10-CM | POA: Diagnosis not present

## 2018-03-18 DIAGNOSIS — Z955 Presence of coronary angioplasty implant and graft: Secondary | ICD-10-CM

## 2018-03-18 DIAGNOSIS — Z6841 Body Mass Index (BMI) 40.0 and over, adult: Secondary | ICD-10-CM | POA: Diagnosis not present

## 2018-03-18 DIAGNOSIS — I2510 Atherosclerotic heart disease of native coronary artery without angina pectoris: Secondary | ICD-10-CM | POA: Diagnosis not present

## 2018-03-18 DIAGNOSIS — E559 Vitamin D deficiency, unspecified: Secondary | ICD-10-CM | POA: Diagnosis not present

## 2018-03-18 DIAGNOSIS — K219 Gastro-esophageal reflux disease without esophagitis: Secondary | ICD-10-CM | POA: Diagnosis not present

## 2018-03-18 DIAGNOSIS — I2102 ST elevation (STEMI) myocardial infarction involving left anterior descending coronary artery: Secondary | ICD-10-CM

## 2018-03-20 ENCOUNTER — Encounter (HOSPITAL_COMMUNITY): Payer: BLUE CROSS/BLUE SHIELD

## 2018-03-22 ENCOUNTER — Encounter (HOSPITAL_COMMUNITY)
Admission: RE | Admit: 2018-03-22 | Discharge: 2018-03-22 | Disposition: A | Discharge status: Home / Self Care | Payer: BLUE CROSS/BLUE SHIELD | Source: Ambulatory Visit | Attending: Cardiology | Admitting: Cardiology

## 2018-03-22 ENCOUNTER — Encounter (HOSPITAL_COMMUNITY): Payer: BLUE CROSS/BLUE SHIELD

## 2018-03-22 DIAGNOSIS — Z7902 Long term (current) use of antithrombotics/antiplatelets: Secondary | ICD-10-CM | POA: Diagnosis not present

## 2018-03-22 DIAGNOSIS — E785 Hyperlipidemia, unspecified: Secondary | ICD-10-CM | POA: Diagnosis not present

## 2018-03-22 DIAGNOSIS — Z96651 Presence of right artificial knee joint: Secondary | ICD-10-CM | POA: Diagnosis not present

## 2018-03-22 DIAGNOSIS — I2102 ST elevation (STEMI) myocardial infarction involving left anterior descending coronary artery: Secondary | ICD-10-CM

## 2018-03-22 DIAGNOSIS — I252 Old myocardial infarction: Secondary | ICD-10-CM | POA: Diagnosis not present

## 2018-03-22 DIAGNOSIS — K760 Fatty (change of) liver, not elsewhere classified: Secondary | ICD-10-CM | POA: Diagnosis not present

## 2018-03-22 DIAGNOSIS — I10 Essential (primary) hypertension: Secondary | ICD-10-CM | POA: Diagnosis not present

## 2018-03-22 DIAGNOSIS — E559 Vitamin D deficiency, unspecified: Secondary | ICD-10-CM | POA: Diagnosis not present

## 2018-03-22 DIAGNOSIS — Z955 Presence of coronary angioplasty implant and graft: Secondary | ICD-10-CM

## 2018-03-22 DIAGNOSIS — I2510 Atherosclerotic heart disease of native coronary artery without angina pectoris: Secondary | ICD-10-CM | POA: Diagnosis not present

## 2018-03-22 DIAGNOSIS — Z6841 Body Mass Index (BMI) 40.0 and over, adult: Secondary | ICD-10-CM | POA: Diagnosis not present

## 2018-03-22 DIAGNOSIS — K219 Gastro-esophageal reflux disease without esophagitis: Secondary | ICD-10-CM | POA: Diagnosis not present

## 2018-03-22 DIAGNOSIS — Z7982 Long term (current) use of aspirin: Secondary | ICD-10-CM | POA: Diagnosis not present

## 2018-03-22 DIAGNOSIS — Z79899 Other long term (current) drug therapy: Secondary | ICD-10-CM | POA: Diagnosis not present

## 2018-03-22 DIAGNOSIS — Z7951 Long term (current) use of inhaled steroids: Secondary | ICD-10-CM | POA: Diagnosis not present

## 2018-03-22 DIAGNOSIS — J45909 Unspecified asthma, uncomplicated: Secondary | ICD-10-CM | POA: Diagnosis not present

## 2018-03-25 ENCOUNTER — Encounter (HOSPITAL_COMMUNITY)
Admission: RE | Admit: 2018-03-25 | Discharge: 2018-03-25 | Disposition: A | Discharge status: Home / Self Care | Payer: BLUE CROSS/BLUE SHIELD | Source: Ambulatory Visit | Attending: Cardiology | Admitting: Cardiology

## 2018-03-25 ENCOUNTER — Encounter (HOSPITAL_COMMUNITY): Payer: BLUE CROSS/BLUE SHIELD

## 2018-03-25 DIAGNOSIS — I10 Essential (primary) hypertension: Secondary | ICD-10-CM | POA: Diagnosis not present

## 2018-03-25 DIAGNOSIS — Z7902 Long term (current) use of antithrombotics/antiplatelets: Secondary | ICD-10-CM | POA: Diagnosis not present

## 2018-03-25 DIAGNOSIS — Z7982 Long term (current) use of aspirin: Secondary | ICD-10-CM | POA: Diagnosis not present

## 2018-03-25 DIAGNOSIS — I2102 ST elevation (STEMI) myocardial infarction involving left anterior descending coronary artery: Secondary | ICD-10-CM

## 2018-03-25 DIAGNOSIS — E785 Hyperlipidemia, unspecified: Secondary | ICD-10-CM | POA: Diagnosis not present

## 2018-03-25 DIAGNOSIS — Z79899 Other long term (current) drug therapy: Secondary | ICD-10-CM | POA: Diagnosis not present

## 2018-03-25 DIAGNOSIS — Z955 Presence of coronary angioplasty implant and graft: Secondary | ICD-10-CM

## 2018-03-25 DIAGNOSIS — J45909 Unspecified asthma, uncomplicated: Secondary | ICD-10-CM | POA: Diagnosis not present

## 2018-03-25 DIAGNOSIS — E559 Vitamin D deficiency, unspecified: Secondary | ICD-10-CM | POA: Diagnosis not present

## 2018-03-25 DIAGNOSIS — Z7951 Long term (current) use of inhaled steroids: Secondary | ICD-10-CM | POA: Diagnosis not present

## 2018-03-25 DIAGNOSIS — K219 Gastro-esophageal reflux disease without esophagitis: Secondary | ICD-10-CM | POA: Diagnosis not present

## 2018-03-25 DIAGNOSIS — Z6841 Body Mass Index (BMI) 40.0 and over, adult: Secondary | ICD-10-CM | POA: Diagnosis not present

## 2018-03-25 DIAGNOSIS — I252 Old myocardial infarction: Secondary | ICD-10-CM | POA: Diagnosis not present

## 2018-03-25 DIAGNOSIS — I2510 Atherosclerotic heart disease of native coronary artery without angina pectoris: Secondary | ICD-10-CM | POA: Diagnosis not present

## 2018-03-25 DIAGNOSIS — K760 Fatty (change of) liver, not elsewhere classified: Secondary | ICD-10-CM | POA: Diagnosis not present

## 2018-03-25 DIAGNOSIS — Z96651 Presence of right artificial knee joint: Secondary | ICD-10-CM | POA: Diagnosis not present

## 2018-03-27 ENCOUNTER — Encounter (HOSPITAL_COMMUNITY): Payer: BLUE CROSS/BLUE SHIELD

## 2018-03-27 ENCOUNTER — Encounter (HOSPITAL_COMMUNITY)
Admission: RE | Admit: 2018-03-27 | Discharge: 2018-03-27 | Disposition: A | Discharge status: Home / Self Care | Payer: BLUE CROSS/BLUE SHIELD | Source: Ambulatory Visit | Attending: Cardiology | Admitting: Cardiology

## 2018-03-27 ENCOUNTER — Encounter (HOSPITAL_COMMUNITY): Payer: Self-pay

## 2018-03-27 DIAGNOSIS — Z7951 Long term (current) use of inhaled steroids: Secondary | ICD-10-CM | POA: Diagnosis not present

## 2018-03-27 DIAGNOSIS — Z79899 Other long term (current) drug therapy: Secondary | ICD-10-CM | POA: Diagnosis not present

## 2018-03-27 DIAGNOSIS — I252 Old myocardial infarction: Secondary | ICD-10-CM | POA: Diagnosis not present

## 2018-03-27 DIAGNOSIS — K219 Gastro-esophageal reflux disease without esophagitis: Secondary | ICD-10-CM | POA: Diagnosis not present

## 2018-03-27 DIAGNOSIS — Z955 Presence of coronary angioplasty implant and graft: Secondary | ICD-10-CM

## 2018-03-27 DIAGNOSIS — I2102 ST elevation (STEMI) myocardial infarction involving left anterior descending coronary artery: Secondary | ICD-10-CM

## 2018-03-27 DIAGNOSIS — I10 Essential (primary) hypertension: Secondary | ICD-10-CM | POA: Diagnosis not present

## 2018-03-27 DIAGNOSIS — Z7902 Long term (current) use of antithrombotics/antiplatelets: Secondary | ICD-10-CM | POA: Diagnosis not present

## 2018-03-27 DIAGNOSIS — Z7982 Long term (current) use of aspirin: Secondary | ICD-10-CM | POA: Diagnosis not present

## 2018-03-27 DIAGNOSIS — K760 Fatty (change of) liver, not elsewhere classified: Secondary | ICD-10-CM | POA: Diagnosis not present

## 2018-03-27 DIAGNOSIS — E559 Vitamin D deficiency, unspecified: Secondary | ICD-10-CM | POA: Diagnosis not present

## 2018-03-27 DIAGNOSIS — Z6841 Body Mass Index (BMI) 40.0 and over, adult: Secondary | ICD-10-CM | POA: Diagnosis not present

## 2018-03-27 DIAGNOSIS — Z96651 Presence of right artificial knee joint: Secondary | ICD-10-CM | POA: Diagnosis not present

## 2018-03-27 DIAGNOSIS — I2510 Atherosclerotic heart disease of native coronary artery without angina pectoris: Secondary | ICD-10-CM | POA: Diagnosis not present

## 2018-03-27 DIAGNOSIS — J45909 Unspecified asthma, uncomplicated: Secondary | ICD-10-CM | POA: Diagnosis not present

## 2018-03-27 DIAGNOSIS — E785 Hyperlipidemia, unspecified: Secondary | ICD-10-CM | POA: Diagnosis not present

## 2018-03-28 NOTE — Progress Notes (Signed)
Cardiac Individual Treatment Plan  Patient Details  Name: Alyssa Phillips MRN: [id number] Date of Birth: [birth year] Referring Provider:     CARDIAC REHAB PHASE II ORIENTATION from 01/01/2018 in [facility] CARDIAC REHAB  Referring Provider  [place], Peter, MD      Initial Encounter Date:    CARDIAC REHAB PHASE II ORIENTATION from 01/01/2018 in [facility] CARDIAC REHAB  Date  01/01/18      Visit Diagnosis: 10/31/17 STEMI  10/31/17 DES LAD  Patient's Home Medications on Admission:  Current Outpatient Medications:  .  acetaminophen (TYLENOL) 325 MG tablet, Take 325 mg by mouth every 6 (six) hours as needed., Disp: , Rfl:  .  aspirin EC 81 MG EC tablet, Take 1 tablet (81 mg total) by mouth daily., Disp: , Rfl:  .  atorvastatin (LIPITOR) 80 MG tablet, Take 1 tablet (80 mg total) by mouth daily at 6 PM., Disp: 30 tablet, Rfl: 5 .  benzonatate (TESSALON) 100 MG capsule, benzonatate 100 mg capsule, Disp: , Rfl:  .  Bepotastine Besilate (BEPREVE) 1.5 % SOLN, Place 1 drop into both eyes daily as needed. For itchy eyes, Disp: , Rfl:  .  budesonide-formoterol (SYMBICORT) 160-4.5 MCG/ACT inhaler, Inhale 2 puffs into the lungs 2 (two) times daily., Disp: , Rfl:  .  cholecalciferol (VITAMIN D) 1000 units tablet, Take 1,000 Units by mouth 2 (two) times daily., Disp: , Rfl:  .  diclofenac sodium (VOLTAREN) 1 % GEL, diclofenac 1 % topical gel, Disp: , Rfl:  .  levalbuterol (XOPENEX HFA) 45 MCG/ACT inhaler, Inhale 2 puffs into the lungs every 4 (four) hours as needed for wheezing., Disp: 1 Inhaler, Rfl: 0 .  levalbuterol (XOPENEX) 0.63 MG/3ML nebulizer solution, Take 0.63 mg by nebulization every 4 (four) hours as needed for wheezing or shortness of breath., Disp: , Rfl:  .  levocetirizine (XYZAL) 5 MG tablet, Take 5 mg by mouth every evening., Disp: , Rfl:  .  lisinopril (PRINIVIL,ZESTRIL) 5 MG tablet, Take 1 tablet (5 mg total) by mouth daily., Disp: 30 tablet, Rfl:  5 .  metoprolol succinate (TOPROL-XL) 50 MG 24 hr tablet, Take 1 tablet (50 mg total) by mouth daily. Take with or immediately following a meal., Disp: 30 tablet, Rfl: 5 .  mometasone (NASONEX) 50 MCG/ACT nasal spray, Place 2 sprays into the nose every morning., Disp: , Rfl:  .  montelukast (SINGULAIR) 10 MG tablet, Take 10 mg by mouth at bedtime., Disp: , Rfl:  .  Multiple Vitamin (MULTIVITAMIN) tablet, Take 1 tablet by mouth daily., Disp: , Rfl:  .  nitroGLYCERIN (NITROSTAT) 0.4 MG SL tablet, Place 1 tablet (0.4 mg total) under the tongue every 5 (five) minutes as needed for chest pain., Disp: 25 tablet, Rfl: 2 .  ticagrelor (BRILINTA) 90 MG TABS tablet, Take 1 tablet (90 mg total) by mouth 2 (two) times daily., Disp: 180 tablet, Rfl: 3  Past Medical History: Past Medical History:  Diagnosis Date  . Allergic rhinitis   . Arthritis    Osteoarthrits-knees-hx. RTKA  . Asthma    environmental agents induced asthma  . Coronary artery disease   . Family history of adverse reaction to anesthesia    daughter very sensitive to anesthesia  . GERD (gastroesophageal reflux disease)   . Heart murmur    hx. mitral valve proplapse-mostly asymptomatic-->not apprectiaed n echo  . Hepatic steatosis 03/05/2008   [place]  . Hyperlipidemia   . Hypertension   . Morbid obesity due to  excess calories (HCC)   . STEMI (ST elevation myocardial infarction) (HCC) 10/2017   LAD treated with DESx1  . UTI (urinary tract infection)   . Vitamin D deficiency   . Vitamin D deficiency     Tobacco Use: Social History   Tobacco Use  Smoking Status Never Smoker  Smokeless Tobacco Never Used    Labs: Recent Review Flowsheet Data    Labs for ITP Cardiac and Pulmonary Rehab Latest Ref Rng & Units 04/07/2016 10/31/2016 11/01/2017 12/04/2017 01/02/2018   Cholestrol 100 - 199 mg/dL 191(Y) - 782(N) - 562   LDLCALC 0 - 99 mg/dL 130 865 784(O) - 44   HDL >39 mg/dL 44 47 49 - 96(E)   Trlycerides 0 - 149 mg/dL 952 841 324 -  401   Hemoglobin A1c 4.6 - 6.5 % - - - 5.6 -   TCO2 22 - 32 mmol/L - - 22 - -      Capillary Blood Glucose: No results found for: GLUCAP   Exercise Target Goals: Exercise Program Goal: Individual exercise prescription set using results from initial 6 min walk test and THRR while considering  patient's activity barriers and safety.   Exercise Prescription Goal: Initial exercise prescription builds to 30-45 minutes a day of aerobic activity, 2-3 days per week.  Home exercise guidelines will be given to patient during program as part of exercise prescription that the participant will acknowledge.  Activity Barriers & Risk Stratification: Activity Barriers & Cardiac Risk Stratification - 01/01/18 0816      Activity Barriers & Cardiac Risk Stratification   Activity Barriers  Left Knee Replacement;Right Knee Replacement;Arthritis;Other (comment)    Comments  Bursitis left shoulder, arthritis top of left foot, bilateral heel spurs    Cardiac Risk Stratification  High       6 Minute Walk: 6 Minute Walk    Row Name 01/01/18 0829         6 Minute Walk   Phase  Initial     Distance  1204 feet     Walk Time  6 minutes     # of Rest Breaks  0     MPH  2.28     METS  2.24     RPE  11     VO2 Peak  7.84     Symptoms  Yes (comment)     Comments  Patient c/o feeling "breathy" after walk test.     Resting HR  100 bpm     Resting BP  98/60     Resting Oxygen Saturation   96 %     Exercise Oxygen Saturation  during 6 min walk  97 %     Max Ex. HR  131 bpm     Max Ex. BP  124/80     2 Minute Post BP  104/60        Oxygen Initial Assessment:   Oxygen Re-Evaluation:   Oxygen Discharge (Final Oxygen Re-Evaluation):   Initial Exercise Prescription: Initial Exercise Prescription - 01/01/18 1100      Date of Initial Exercise RX and Referring Provider   Date  01/01/18    Referring Provider  [place], Peter, MD      NuStep   Level  3    SPM  85    Minutes  10    METs  2.5       Arm Ergometer   Level  2    Minutes  10    METs  2.5      Track   Laps  10    Minutes  10    METs  2.74      Prescription Details   Frequency (times per week)  3    Duration  Progress to 30 minutes of continuous aerobic without signs/symptoms of physical distress      Intensity   THRR 40-80% of Max Heartrate  63-126    Ratings of Perceived Exertion  11-13    Perceived Dyspnea  0-4      Progression   Progression  Continue to progress workloads to maintain intensity without signs/symptoms of physical distress.      Resistance Training   Training Prescription  Yes    Weight  3lbs    Reps  10-15       Perform Capillary Blood Glucose checks as needed.  Exercise Prescription Changes: Exercise Prescription Changes    Row Name 01/07/18 1600 01/16/18 1600 01/28/18 1600 02/11/18 0900 02/25/18 1348     Response to Exercise   Blood Pressure (Admit)  100/60  102/76  104/64  110/68  110/68   Blood Pressure (Exercise)  114/70  108/76  128/62  120/68  120/70   Blood Pressure (Exit)  104/60  92/60  104/60  100/62  102/72   Heart Rate (Admit)  99 bpm  103 bpm  100 bpm  99 bpm  90 bpm   Heart Rate (Exercise)  129 bpm  129 bpm  124 bpm  130 bpm  129 bpm   Heart Rate (Exit)  97 bpm  90 bpm  87 bpm  95 bpm  88 bpm   Rating of Perceived Exertion (Exercise)  11  11  11  12  11    Perceived Dyspnea (Exercise)  0  0  0  0  0   Symptoms  None  None  None  None  None   Comments  Pt's first day of exercise   -  None  None  None   Duration  Progress to 30 minutes of  aerobic without signs/symptoms of physical distress  Continue with 30 min of aerobic exercise without signs/symptoms of physical distress.  Continue with 30 min of aerobic exercise without signs/symptoms of physical distress.  Continue with 30 min of aerobic exercise without signs/symptoms of physical distress.  Continue with 30 min of aerobic exercise without signs/symptoms of physical distress.   Intensity  THRR unchanged  THRR  unchanged  THRR unchanged  THRR unchanged  THRR unchanged     Progression   Progression  Continue to progress workloads to maintain intensity without signs/symptoms of physical distress.  Continue to progress workloads to maintain intensity without signs/symptoms of physical distress.  Continue to progress workloads to maintain intensity without signs/symptoms of physical distress.  Continue to progress workloads to maintain intensity without signs/symptoms of physical distress.  Continue to progress workloads to maintain intensity without signs/symptoms of physical distress.   Average METs  2.3  3.13  2.7  2.96  2.96     Resistance Training   Training Prescription  Yes  No  Yes  Yes  Yes   Weight  3lbs  -  3lbs  3lbs  3lbs   Reps  10-15  -  10-15  10-15  10-15   Time  10 Minutes  -  10 Minutes  10 Minutes  10 Minutes     Interval Training   Interval Training  No  No  No  No  No     NuStep   Level  3  3  4  4  4    SPM  85  100  100  100  100   Minutes  10  10  10  10  10    METs  1.6  4.2  2.7  3  3.1     Arm Ergometer   Level  2  2  2  2  2    Minutes  10  10  10  10  10    METs  2.7  2.2  1.95  1.96  1.96     Track   Laps  11  13  14  16  16    Minutes  10  10  10  10  10    METs  2.91  3.3  3.45  3.76  3.76     Home Exercise Plan   Plans to continue exercise at  -  -  -  Home (comment) Walking  Home (comment) Walking   Frequency  -  -  -  Add 2 additional days to program exercise sessions.  Add 2 additional days to program exercise sessions.   Initial Home Exercises Provided  -  -  -  01/30/18  01/30/18   Row Name 03/04/18 1400 03/13/18 1005 03/25/18 1000 03/27/18 1400       Response to Exercise   Blood Pressure (Admit)  110/64  112/64  104/60  100/64    Blood Pressure (Exercise)  120/66  122/68  122/72  112/74    Blood Pressure (Exit)  108/68  108/68  100/68  106/62    Heart Rate (Admit)  94 bpm  100 bpm  88 bpm  86 bpm    Heart Rate (Exercise)  133 bpm  129 bpm  111 bpm   101 bpm    Heart Rate (Exit)  94 bpm  91 bpm  80 bpm  86 bpm    Rating of Perceived Exertion (Exercise)  10  10  10  11     Perceived Dyspnea (Exercise)  0  0  0  0    Symptoms  None   None   None  Foot Pain    Comments  None  None  None  Removed Track from Prescription    Duration  Progress to 45 minutes of aerobic exercise without signs/symptoms of physical distress  Progress to 45 minutes of aerobic exercise without signs/symptoms of physical distress  Continue with 45 min of aerobic exercise without signs/symptoms of physical distress.  Continue with 45 min of aerobic exercise without signs/symptoms of physical distress.    Intensity  THRR unchanged  THRR unchanged  THRR unchanged  THRR unchanged      Progression   Progression  Continue to progress workloads to maintain intensity without signs/symptoms of physical distress.  Continue to progress workloads to maintain intensity without signs/symptoms of physical distress.  Continue to progress workloads to maintain intensity without signs/symptoms of physical distress.  Continue to progress workloads to maintain intensity without signs/symptoms of physical distress.    Average METs  3.4  3.44  3.4  3      Resistance Training   Training Prescription  Yes  Yes  Yes  No    Weight  3lbs  3lbs  3lbs  -    Reps  10-15  10-15  10-15  -    Time  10 Minutes  10 Minutes  10 Minutes  -  Interval Training   Interval Training  No  No  No  -      NuStep   Level  5  5  5  5     SPM  100  100  100  100    Minutes  10  10  10  15     METs  3.2  3.1  3.1  2.6      Arm Ergometer   Level  2  2  2  2     Minutes  10  10  10  15     METs  2  2  2  2       Track   Laps  16  16  14   -    Minutes  10  10  10   -    METs  3.76  3.76  3.4  -      Home Exercise Plan   Plans to continue exercise at  Home (comment) Walking  Home (comment) Walking  Home (comment) Walking  Home (comment) Walking    Frequency  Add 2 additional days to program exercise  sessions.  Add 2 additional days to program exercise sessions.  Add 2 additional days to program exercise sessions.  Add 2 additional days to program exercise sessions.    Initial Home Exercises Provided  01/30/18  01/30/18  01/30/18  01/30/18       Exercise Comments: Exercise Comments    Row Name 01/07/18 1622 01/07/18 1626 01/30/18 1412 03/04/18 1410 03/27/18 1415   Exercise Comments  Today, was pt's first day of exercise. Pt responded well to exercise workloads. Will continue to monitor and progress pt as tolerated.   Today, was pt's first day of exercise. Pt responded well to exercise workloads. Will continue to monitor and progress pt as tolerated.   Reviewed HEP with pt. Pt verbalized understanding. Will continue to monitor and progress pt as tolerated.   Reveiwed METs and Goals with pt. Pt is continuing to respond well to workload increases and put forth great effort. Will continue to monitor pt.   Reviewed METs and Goals with pt. Pt has reported increased left foot pain due to arthritis. Walking Track has been removed from pt's exercise prescription to help with pain. Will continue to monitor.       Exercise Goals and Review: Exercise Goals    Row Name 01/01/18 0820             Exercise Goals   Increase Physical Activity  Yes       Intervention  Provide advice, education, support and counseling about physical activity/exercise needs.;Develop an individualized exercise prescription for aerobic and resistive training based on initial evaluation findings, risk stratification, comorbidities and participant's personal goals.       Expected Outcomes  Short Term: Attend rehab on a regular basis to increase amount of physical activity.;Long Term: Exercising regularly at least 3-5 days a week.;Long Term: Add in home exercise to make exercise part of routine and to increase amount of physical activity.       Increase Strength and Stamina  Yes       Intervention  Provide advice, education,  support and counseling about physical activity/exercise needs.;Develop an individualized exercise prescription for aerobic and resistive training based on initial evaluation findings, risk stratification, comorbidities and participant's personal goals.       Expected Outcomes  Short Term: Increase workloads from initial exercise prescription for resistance, speed, and METs.;Short Term: Perform resistance training  exercises routinely during rehab and add in resistance training at home;Long Term: Improve cardiorespiratory fitness, muscular endurance and strength as measured by increased METs and functional capacity ( )       Able to understand and use rate of perceived exertion (RPE) scale  Yes       Intervention  Provide education and explanation on how to use RPE scale       Expected Outcomes  Short Term: Able to use RPE daily in rehab to express subjective intensity level;Long Term:  Able to use RPE to guide intensity level when exercising independently       Knowledge and understanding of Target Heart Rate Range (THRR)  Yes       Intervention  Provide education and explanation of THRR including how the numbers were predicted and where they are located for reference       Expected Outcomes  Short Term: Able to state/look up THRR;Long Term: Able to use THRR to govern intensity when exercising independently;Short Term: Able to use daily as guideline for intensity in rehab       Able to check pulse independently  Yes       Intervention  Provide education and demonstration on how to check pulse in carotid and radial arteries.;Review the importance of being able to check your own pulse for safety during independent exercise       Expected Outcomes  Short Term: Able to explain why pulse checking is important during independent exercise;Long Term: Able to check pulse independently and accurately       Understanding of Exercise Prescription  Yes       Intervention  Provide education, explanation, and written  materials on patient's individual exercise prescription       Expected Outcomes  Short Term: Able to explain program exercise prescription;Long Term: Able to explain home exercise prescription to exercise independently          Exercise Goals Re-Evaluation : Exercise Goals Re-Evaluation    Row Name 01/30/18 1408 03/04/18 1411 03/27/18 1417         Exercise Goal Re-Evaluation   Exercise Goals Review  Increase Physical Activity;Able to understand and use rate of perceived exertion (RPE) scale;Knowledge and understanding of Target Heart Rate Range (THRR);Understanding of Exercise Prescription;Increase Strength and Stamina;Able to check pulse independently  Increase Physical Activity;Understanding of Exercise Prescription  Increase Physical Activity;Understanding of Exercise Prescription     Comments  Reveiwed HEP with pt. Also reviewed THRR, RPE Scale, weather conditions, endpoints of exercise, NTG use, warmup and cool down.   Reviewed METs and Goals with pt. Pt is putting for great effort while in rehab. Pt has increased her walking distance on the track while walking for 10 minutes. Pt is now able to get 16 laps. Pt also invested in new walking shoes and states that has really helped her while exercising.   Reviewed METs and Goals. Will follow up with pt regarding increasing Nustep level to 6 from 5, and arm crank level to 2.5. Despite foot limitations, pt puts forth great effort with exercise.      Expected Outcomes  Pt will plan to continue to walk 2-3 days a week for 15 minutes. Pt will gradually add a minute to progress to 30 minutes of walking. Pt does have rower and bike at home and will use those from time to time. Will continue to monitor and progress as tolerated.   Pt is continuing to walk for exercise 2-3 days a week for  15 minutes. Pt will continue to increase cardiovascular strength. Will continue to monitor and progress pt as tolerated.   Pt will continue to walk 2-3 days for 20 minutes.  Will continue to monitor and progress pt.         Discharge Exercise Prescription (Final Exercise Prescription Changes): Exercise Prescription Changes - 03/27/18 1400      Response to Exercise   Blood Pressure (Admit)  100/64    Blood Pressure (Exercise)  112/74    Blood Pressure (Exit)  106/62    Heart Rate (Admit)  86 bpm    Heart Rate (Exercise)  101 bpm    Heart Rate (Exit)  86 bpm    Rating of Perceived Exertion (Exercise)  11    Perceived Dyspnea (Exercise)  0    Symptoms  Foot Pain    Comments  Removed Track from Prescription    Duration  Continue with 45 min of aerobic exercise without signs/symptoms of physical distress.    Intensity  THRR unchanged      Progression   Progression  Continue to progress workloads to maintain intensity without signs/symptoms of physical distress.    Average METs  3      Resistance Training   Training Prescription  No      NuStep   Level  5    SPM  100    Minutes  15    METs  2.6      Arm Ergometer   Level  2    Minutes  15    METs  2      Home Exercise Plan   Plans to continue exercise at  Home (comment)   Walking   Frequency  Add 2 additional days to program exercise sessions.    Initial Home Exercises Provided  01/30/18       Nutrition:  Target Goals: Understanding of nutrition guidelines, daily intake of sodium 1500mg , cholesterol 200mg , calories 30% from fat and 7% or less from saturated fats, daily to have 5 or more servings of fruits and vegetables.  Biometrics: Pre Biometrics - 01/01/18 1155      Pre Biometrics   Height  5' 8.25" (1.734 m)    Weight  (!) 141.9 kg    Waist Circumference  50.5 inches    Hip Circumference  60 inches    Waist to Hip Ratio  0.84 %    BMI (Calculated)  47.19    Triceps Skinfold  60 mm    % Body Fat  57.7 %    Grip Strength  36 kg    Flexibility  14 in    Single Leg Stand  4.18 seconds        Nutrition Therapy Plan and Nutrition Goals: Nutrition Therapy & Goals - 01/01/18  0916      Nutrition Therapy   Diet  heart healthy, carb modified      Personal Nutrition Goals   Nutrition Goal  Pt to identify and limit food sources of saturated fat, trans fat, refined carbohydrates and sodium    Personal Goal #2  Pt to identify food quantities necessary to achieve weight loss of 6-24 lbs. at graduation from cardiac rehab.    Personal Goal #3  Pt able to name foods that affect blood glucose      Intervention Plan   Intervention  Prescribe, educate and counsel regarding individualized specific dietary modifications aiming towards targeted core components such as weight, hypertension, lipid management, diabetes, heart failure and  other comorbidities.    Expected Outcomes  Short Term Goal: Understand basic principles of dietary content, such as calories, fat, sodium, cholesterol and nutrients.;Long Term Goal: Adherence to prescribed nutrition plan.       Nutrition Assessments: Nutrition Assessments - 01/01/18 0917      MEDFICTS Scores   Pre Score  12       Nutrition Goals Re-Evaluation:   Nutrition Goals Re-Evaluation:   Nutrition Goals Discharge (Final Nutrition Goals Re-Evaluation):   Psychosocial: Target Goals: Acknowledge presence or absence of significant depression and/or stress, maximize coping skills, provide positive support system. Participant is able to verbalize types and ability to use techniques and skills needed for reducing stress and depression.  Initial Review & Psychosocial Screening: Initial Psych Review & Screening - 01/01/18 1221      Initial Review   Current issues with  None Identified      Family Dynamics   Good Support System?  Yes      Barriers   Psychosocial barriers to participate in program  There are no identifiable barriers or psychosocial needs.      Screening Interventions   Interventions  Encouraged to exercise       Quality of Life Scores: Quality of Life - 01/01/18 1030      Quality of Life   Select   Quality of Life      Quality of Life Scores   Health/Function Pre  23.6 %    Socioeconomic Pre  29.25 %    Psych/Spiritual Pre  26.57 %    Family Pre  28.8 %    GLOBAL Pre  26.23 %      Scores of 19 and below usually indicate a poorer quality of life in these areas.  A difference of  2-3 points is a clinically meaningful difference.  A difference of 2-3 points in the total score of the Quality of Life Index has been associated with significant improvement in overall quality of life, self-image, physical symptoms, and general health in studies assessing change in quality of life.  PHQ-9: Recent Review Flowsheet Data    Depression screen [facility] 2/9 01/07/2018 12/04/2017   Decreased Interest 0 0   Down, Depressed, Hopeless 0 0   PHQ - 2 Score 0 0     Interpretation of Total Score  Total Score Depression Severity:  1-4 = Minimal depression, 5-9 = Mild depression, 10-14 = Moderate depression, 15-19 = Moderately severe depression, 20-27 = Severe depression   Psychosocial Evaluation and Intervention: Psychosocial Evaluation - 01/07/18 0900      Psychosocial Evaluation & Interventions   Interventions  Encouraged to exercise with the program and follow exercise prescription    Comments  No psychosocial needs identified. Pt enjoys drawing, painting, and playing with her grandchildren.     Expected Outcomes  Alyssa Phillips will continue to exhibit a positive outlook.     Continue Psychosocial Services   No Follow up required       Psychosocial Re-Evaluation: Psychosocial Re-Evaluation    Row Name 01/17/18 0732 02/08/18 1354 03/04/18 1528 03/27/18 1518       Psychosocial Re-Evaluation   Current issues with  None Identified  None Identified  None Identified  None Identified    Comments  No interventions necessary.  No interventions necessary.  No interventions necessary.  No interventions necessary.    Expected Outcomes  Alyssa Phillips will maintain a positive outlook.  Alyssa Phillips will maintain a positive outlook  with good coping skills.   Alyssa Phillips  will maintain a positive outlook with good coping skills.   Alyssa Phillips will maintain a positive outlook with good coping skills.     Interventions  Encouraged to attend Cardiac Rehabilitation for the exercise  Encouraged to attend Cardiac Rehabilitation for the exercise  Encouraged to attend Cardiac Rehabilitation for the exercise  Encouraged to attend Cardiac Rehabilitation for the exercise    Continue Psychosocial Services   No Follow up required  No Follow up required  No Follow up required  No Follow up required       Psychosocial Discharge (Final Psychosocial Re-Evaluation): Psychosocial Re-Evaluation - 03/27/18 1518      Psychosocial Re-Evaluation   Current issues with  None Identified    Comments  No interventions necessary.    Expected Outcomes  Alyssa Phillips will maintain a positive outlook with good coping skills.     Interventions  Encouraged to attend Cardiac Rehabilitation for the exercise    Continue Psychosocial Services   No Follow up required       Vocational Rehabilitation: Provide vocational rehab assistance to qualifying candidates.   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 Participant will state understanding/return demonstration of topics presented.  Learning Barriers/Preferences: Learning Barriers/Preferences - 01/01/18 1225      Learning Barriers/Preferences   Learning Barriers  Sight   Wears glasses   Learning Preferences  Pictoral;Skilled Demonstration       Education Topics: Count Your Pulse:  -Group instruction provided by verbal instruction, demonstration, patient participation and written materials to support subject.  Instructors address  importance of being able to find your pulse and how to count your pulse when at home without a heart monitor.  Patients get hands on experience counting their pulse with staff help and individually.   [facility]ARD[facility]AB [facility]  Date  02/08/18  Instruction Review Code  3- Needs Reinforcement      Heart Attack, Angina, and Risk Factor Modification:  -Group instruction provided by verbal instruction, video, and written materials to support subject.  Instructors address signs and symptoms of angina and heart attacks.    Also discuss risk factors for heart disease and how to make changes to improve heart health risk factors.   CARDIAC REHAB PHASE [facility]  Date  01/16/18 [Patient unable to stay for class, handout given.]  Instruction Review Code  2- Demonstrated Understanding      Functional Fitness:  -Group instruction provided by verbal instruction, demonstration, patient participation, and written materials to support subject.  Instructors address safety measures for doing things around the house.  Discuss how to get up and down off the floor, how to pick things up properly, how to safely get out of a chair without assistance, and balance training.   Meditation and Mindfulness:  -Group instruction provided by verbal instruction, patient participation, and written materials to support subject.  Instructor addresses importance of mindfulness and meditation practice to help reduce stress and improve awareness.  Instructor also leads participants through a meditation exercise.    CARDIAC REHAB PHASE [facility]  Date  02/06/18  Educator  Theda Belfast  Instruction Review Code  2- Demonstrated Understanding      Stretching for Flexibility and Mobility:  -Group instruction provided by verbal instruction,  patient participation, and written materials to support subject.  Instructors lead participants through series of stretches that are designed to increase flexibility thus improving mobility.  These stretches are additional exercise for major muscle groups that are typically performed during regular warm up and cool down.   Hands Only CPR:  -Group verbal, video, and participation provides a basic overview of AHA guidelines for community CPR. Role-play of emergencies allow participants the opportunity to practice calling for help and chest compression technique with discussion of AED use.   Hypertension: -Group verbal and written instruction that provides a basic overview of hypertension including the most recent diagnostic guidelines, risk factor reduction with self-care instructions and medication management.    Nutrition I class: Heart Healthy Eating:  -Group instruction provided by PowerPoint slides, verbal discussion, and written materials to support subject matter. The instructor gives an explanation and review of the Therapeutic Lifestyle Changes diet recommendations, which includes a discussion on lipid goals, dietary fat, sodium, fiber, plant stanol/sterol esters, sugar, and the components of a well-balanced, healthy diet.   Nutrition II class: Lifestyle Skills:  -Group instruction provided by PowerPoint slides, verbal discussion, and written materials to support subject matter. The instructor gives an explanation and review of label reading, grocery shopping for heart health, heart healthy recipe modifications, and ways to make healthier choices when eating out.   Diabetes Question & Answer:  -Group instruction provided by PowerPoint slides, verbal discussion, and written materials to support subject matter. The instructor gives an explanation and review of diabetes co-morbidities, pre- and post-prandial blood glucose goals, pre-exercise blood glucose goals, signs, symptoms, and  treatment of hypoglycemia and hyperglycemia, and foot care basics.   Diabetes Blitz:  -Group instruction provided by PowerPoint slides, verbal discussion, and written materials to support subject matter. The instructor gives an explanation and review of the physiology behind type 1 and type 2 diabetes, diabetes medications and rational behind using different medications, pre- and post-prandial blood glucose recommendations and Hemoglobin A1c goals, diabetes diet, and exercise including blood glucose guidelines for exercising safely.    Portion Distortion:  -Group instruction provided by PowerPoint slides, verbal discussion, written materials, and food models to support subject matter. The instructor gives an explanation of serving size versus portion size, changes in portions sizes over the last 20 years, and what consists of a serving from each food group.   Stress Management:  -Group instruction provided by verbal instruction, video, and written materials to support subject matter.  Instructors review role of stress in heart disease and how to cope with stress positively.     Exercising on Your Own:  -Group instruction provided by verbal instruction, power point, and written materials to support subject.  Instructors discuss benefits of exercise, components of exercise, frequency and intensity of exercise, and end points for exercise.  Also discuss use of nitroglycerin and activating EMS.  Review options of places to exercise outside of rehab.  Review guidelines for sex with heart disease.   Cardiac Drugs I:  -Group instruction provided by verbal instruction and written materials to support subject.  Instructor reviews cardiac drug classes: antiplatelets, anticoagulants, beta blockers, and statins.  Instructor discusses reasons, side effects, and lifestyle considerations for each drug class.   Cardiac Drugs II:  -Group instruction provided by verbal instruction and written materials to  support subject.  Instructor reviews cardiac drug classes: angiotensin converting enzyme inhibitors (ACE-I), angiotensin II receptor blockers (ARBs), nitrates, and calcium channel blockers.  Instructor discusses reasons, side effects, and lifestyle considerations for each drug class.   CARDIAC REHAB PHASE II EXERCISE from 02/08/2018 in [facility] CARDIAC REHAB  Date  01/23/18  Educator  Pharmacist  Instruction Review Code  2- Demonstrated Understanding      Anatomy and Physiology of the Circulatory System:  Group verbal and written instruction and models provide basic cardiac anatomy and physiology, with the coronary electrical and arterial systems. Review of: AMI, Angina, Valve disease, Heart Failure, Peripheral Artery Disease, Cardiac Arrhythmia, Pacemakers, and the ICD.   CARDIAC REHAB PHASE II EXERCISE from 01/09/2018 in [facility] C[facility] REHAB  Date  01/09/18  Instruction Review Code  2- Demonstrated Understanding      Other Education:  -Group or individual verbal, written, or video instructions that support the educational goals of the cardiac rehab program.   Holiday Eating Survival Tips:  -Group instruction provided by PowerPoint slides, verbal discussion, and written materials to support subject matter. The instructor gives patients tips, tricks, and techniques to help them not only survive but enjoy the holidays despite the onslaught of food that accompanies the holidays.   Knowledge Questionnaire Score: Knowledge Questionnaire Score - 01/01/18 1027      Knowledge Questionnaire Score   Pre Score  21/24        Core Components/Risk Factors/Patient Goals at Admission: Personal Goals and Risk Factors at Admission - 01/01/18 1228      Core Components/Risk Factors/Patient Goals on Admission    Weight Management  Obesity;Yes    Intervention  Weight Management: Develop a combined nutrition and exercise program designed to reach desired  caloric intake, while maintaining appropriate intake of nutrient and fiber, sodium and fats, and appropriate energy expenditure required for the weight goal.;Weight Management: Provide education and appropriate resources to help participant work on and attain dietary goals.;Weight Management/Obesity: Establish reasonable short term and long term weight goals.;Obesity: Provide education and appropriate resources to help participant work on and attain dietary goals.    Admit Weight  312 lb 13.3 oz (141.9 kg)    Expected Outcomes  Short Term: Continue to assess and modify interventions until short term weight is achieved;Long Term: Adherence to nutrition and physical activity/exercise program aimed toward attainment of established weight goal;Weight Maintenance: Understanding of the daily nutrition guidelines, which includes 25-35% calories from fat, 7% or less cal from saturated fats, less than 200mg  cholesterol, less than 1.5gm of sodium, & 5 or more servings of fruits and vegetables daily;Weight Loss: Understanding of general recommendations for a balanced deficit meal plan, which promotes 1-2 lb weight loss per week and includes a negative energy balance of [phone] kcal/d;Understanding recommendations for meals to include 15-35% energy as protein, 25-35% energy from fat, 35-60% energy from carbohydrates, less than 200mg  of dietary cholesterol, 20-35 gm of total fiber daily;Understanding of distribution of calorie intake throughout the day with the consumption of 4-5 meals/snacks    Hypertension  Yes    Intervention  Provide education on lifestyle modifcations including regular physical activity/exercise, weight management, moderate sodium restriction and increased consumption of fresh fruit, vegetables, and low fat dairy, alcohol moderation, and smoking cessation.;Monitor prescription use compliance.    Expected Outcomes  Short Term: Continued assessment and intervention until BP is < 140/51mm HG in  hypertensive participants. < 130/4mm HG in hypertensive participants with diabetes, heart failure  or chronic kidney disease.;Long Term: Maintenance of blood pressure at goal levels.    Lipids  Yes    Intervention  Provide education and support for participant on nutrition & aerobic/resistive exercise along with prescribed medications to achieve LDL 70mg , HDL >40mg .    Expected Outcomes  Short Term: Participant states understanding of desired cholesterol values and is compliant with medications prescribed. Participant is following exercise prescription and nutrition guidelines.;Long Term: Cholesterol controlled with medications as prescribed, with individualized exercise RX and with personalized nutrition plan. Value goals: LDL < 70mg , HDL > 40 mg.    Stress  Yes    Intervention  Offer individual and/or small group education and counseling on adjustment to heart disease, stress management and health-related lifestyle change. Teach and support self-help strategies.;Refer participants experiencing significant psychosocial distress to appropriate mental health specialists for further evaluation and treatment. When possible, include family members and significant others in education/counseling sessions.    Expected Outcomes  Short Term: Participant demonstrates changes in health-related behavior, relaxation and other stress management skills, ability to obtain effective social support, and compliance with psychotropic medications if prescribed.;Long Term: Emotional wellbeing is indicated by absence of clinically significant psychosocial distress or social isolation.       Core Components/Risk Factors/Patient Goals Review:  Goals and Risk Factor Review    Row Name 01/07/18 0901 01/17/18 0734 02/08/18 1354 03/04/18 1528 03/27/18 1518     Core Components/Risk Factors/Patient Goals Review   Personal Goals Review  Weight Management/Obesity;Lipids;Hypertension;Stress  Weight  Management/Obesity;Lipids;Hypertension;Stress  Weight Management/Obesity;Lipids;Hypertension;Stress  Weight Management/Obesity;Lipids;Hypertension;Stress  Weight Management/Obesity;Lipids;Hypertension;Stress   Review  Pt with multiple CAD RFs willing to participate in CR exercise.  Pt would like to lose weight and get stronger.   Pt with multiple CAD RFs willing to participate in CR exercise.  Alyssa Phillips is off to a great start. She is tolerating exercise well.   Pt with multiple CAD RFs willing to participate in CR exercise.  Alyssa Phillips continues to tolerate exercise well.  She has great attendance.   Pt with multiple CAD RFs willing to participate in CR exercise.  Alyssa Phillips feels that she is increasing her stamina.  She has great attendance but plans to be out to visit with family.   Pt with multiple CAD RFs willing to participate in CR exercise.  Alyssa Phillips feels that she is increasing her stamina.  She is having intermittent issues with left foot pain.  This has changed her exercise routine on days where this has been an issue.    Expected Outcomes  Pt will continue to participate in CR exercise, nutrition, and lifestyle modification opportunities.   Pt will continue to participate in CR exercise, nutrition, and lifestyle modification opportunities.   Pt will continue to participate in CR exercise, nutrition, and lifestyle modification opportunities.   Pt will continue to participate in CR exercise, nutrition, and lifestyle modification opportunities.   Pt will continue to participate in CR exercise, nutrition, and lifestyle modification opportunities.       Core Components/Risk Factors/Patient Goals at Discharge (Final Review):  Goals and Risk Factor Review - 03/27/18 1518      Core Components/Risk Factors/Patient Goals Review   Personal Goals Review  Weight Management/Obesity;Lipids;Hypertension;Stress    Review  Pt with multiple CAD RFs willing to participate in CR exercise.  Alyssa Phillips feels that she is increasing her  stamina.  She is having intermittent issues with left foot pain.  This has changed her exercise routine on days where this has been an issue.  Expected Outcomes  Pt will continue to participate in CR exercise, nutrition, and lifestyle modification opportunities.        ITP Comments: ITP Comments    Row Name 01/01/18 0744 01/07/18 0858 01/17/18 0730 02/08/18 1353 03/04/18 1526   ITP Comments  Medical Director- Dr. Armanda Magic, MD  Pt started exercise today and tolerated it well.   30 Day ITP Review.  Alyssa Phillips is off to a great start with exercise. VSS.  30 Day ITP Review.  Alyssa Phillips is tolerating exercise well. She has great attendance. She continues to lose weigt.   30 Day ITP Review.  Alyssa Phillips is tolerating exercise well.  She feels that she is increasing her stamina.  She has great attendance but plans to be out to visit with family.    Row Name 03/27/18 1518           ITP Comments  30 Day ITP Review.  Alyssa Phillips is tolerating exercise well.  She feels that she is increasing her stamina.  She has intermittently had issues with her left foot causing her pain.  She graduates 04/08/18.          Comments: See ITP Comments.

## 2018-03-29 ENCOUNTER — Encounter (HOSPITAL_COMMUNITY): Payer: BLUE CROSS/BLUE SHIELD

## 2018-04-01 ENCOUNTER — Encounter (HOSPITAL_COMMUNITY): Payer: BLUE CROSS/BLUE SHIELD

## 2018-04-01 ENCOUNTER — Encounter (HOSPITAL_COMMUNITY)
Admission: RE | Admit: 2018-04-01 | Discharge: 2018-04-01 | Disposition: A | Discharge status: Home / Self Care | Payer: BLUE CROSS/BLUE SHIELD | Source: Ambulatory Visit | Attending: Cardiology | Admitting: Cardiology

## 2018-04-01 DIAGNOSIS — I252 Old myocardial infarction: Secondary | ICD-10-CM | POA: Diagnosis not present

## 2018-04-01 DIAGNOSIS — I2510 Atherosclerotic heart disease of native coronary artery without angina pectoris: Secondary | ICD-10-CM | POA: Diagnosis not present

## 2018-04-01 DIAGNOSIS — Z955 Presence of coronary angioplasty implant and graft: Secondary | ICD-10-CM

## 2018-04-01 DIAGNOSIS — K760 Fatty (change of) liver, not elsewhere classified: Secondary | ICD-10-CM | POA: Diagnosis not present

## 2018-04-01 DIAGNOSIS — E785 Hyperlipidemia, unspecified: Secondary | ICD-10-CM | POA: Diagnosis not present

## 2018-04-01 DIAGNOSIS — I10 Essential (primary) hypertension: Secondary | ICD-10-CM | POA: Diagnosis not present

## 2018-04-01 DIAGNOSIS — Z7902 Long term (current) use of antithrombotics/antiplatelets: Secondary | ICD-10-CM | POA: Diagnosis not present

## 2018-04-01 DIAGNOSIS — Z96651 Presence of right artificial knee joint: Secondary | ICD-10-CM | POA: Diagnosis not present

## 2018-04-01 DIAGNOSIS — I2102 ST elevation (STEMI) myocardial infarction involving left anterior descending coronary artery: Secondary | ICD-10-CM

## 2018-04-01 DIAGNOSIS — Z7951 Long term (current) use of inhaled steroids: Secondary | ICD-10-CM | POA: Diagnosis not present

## 2018-04-01 DIAGNOSIS — Z6841 Body Mass Index (BMI) 40.0 and over, adult: Secondary | ICD-10-CM | POA: Diagnosis not present

## 2018-04-01 DIAGNOSIS — E559 Vitamin D deficiency, unspecified: Secondary | ICD-10-CM | POA: Diagnosis not present

## 2018-04-01 DIAGNOSIS — J45909 Unspecified asthma, uncomplicated: Secondary | ICD-10-CM | POA: Diagnosis not present

## 2018-04-01 DIAGNOSIS — Z79899 Other long term (current) drug therapy: Secondary | ICD-10-CM | POA: Diagnosis not present

## 2018-04-01 DIAGNOSIS — K219 Gastro-esophageal reflux disease without esophagitis: Secondary | ICD-10-CM | POA: Diagnosis not present

## 2018-04-01 DIAGNOSIS — Z7982 Long term (current) use of aspirin: Secondary | ICD-10-CM | POA: Diagnosis not present

## 2018-04-03 ENCOUNTER — Encounter (HOSPITAL_COMMUNITY): Payer: BLUE CROSS/BLUE SHIELD

## 2018-04-03 ENCOUNTER — Encounter (HOSPITAL_COMMUNITY)
Admission: RE | Admit: 2018-04-03 | Discharge: 2018-04-03 | Disposition: A | Discharge status: Home / Self Care | Payer: BLUE CROSS/BLUE SHIELD | Source: Ambulatory Visit | Attending: Cardiology | Admitting: Cardiology

## 2018-04-03 DIAGNOSIS — Z6841 Body Mass Index (BMI) 40.0 and over, adult: Secondary | ICD-10-CM | POA: Diagnosis not present

## 2018-04-03 DIAGNOSIS — K760 Fatty (change of) liver, not elsewhere classified: Secondary | ICD-10-CM | POA: Diagnosis not present

## 2018-04-03 DIAGNOSIS — Z7951 Long term (current) use of inhaled steroids: Secondary | ICD-10-CM | POA: Diagnosis not present

## 2018-04-03 DIAGNOSIS — Z955 Presence of coronary angioplasty implant and graft: Secondary | ICD-10-CM | POA: Diagnosis not present

## 2018-04-03 DIAGNOSIS — K219 Gastro-esophageal reflux disease without esophagitis: Secondary | ICD-10-CM | POA: Diagnosis not present

## 2018-04-03 DIAGNOSIS — Z79899 Other long term (current) drug therapy: Secondary | ICD-10-CM | POA: Diagnosis not present

## 2018-04-03 DIAGNOSIS — I2510 Atherosclerotic heart disease of native coronary artery without angina pectoris: Secondary | ICD-10-CM | POA: Diagnosis not present

## 2018-04-03 DIAGNOSIS — E559 Vitamin D deficiency, unspecified: Secondary | ICD-10-CM | POA: Diagnosis not present

## 2018-04-03 DIAGNOSIS — Z96651 Presence of right artificial knee joint: Secondary | ICD-10-CM | POA: Diagnosis not present

## 2018-04-03 DIAGNOSIS — Z7902 Long term (current) use of antithrombotics/antiplatelets: Secondary | ICD-10-CM | POA: Diagnosis not present

## 2018-04-03 DIAGNOSIS — E785 Hyperlipidemia, unspecified: Secondary | ICD-10-CM | POA: Diagnosis not present

## 2018-04-03 DIAGNOSIS — J45909 Unspecified asthma, uncomplicated: Secondary | ICD-10-CM | POA: Diagnosis not present

## 2018-04-03 DIAGNOSIS — I2102 ST elevation (STEMI) myocardial infarction involving left anterior descending coronary artery: Secondary | ICD-10-CM

## 2018-04-03 DIAGNOSIS — I252 Old myocardial infarction: Secondary | ICD-10-CM | POA: Diagnosis not present

## 2018-04-03 DIAGNOSIS — I10 Essential (primary) hypertension: Secondary | ICD-10-CM | POA: Diagnosis not present

## 2018-04-03 DIAGNOSIS — Z7982 Long term (current) use of aspirin: Secondary | ICD-10-CM | POA: Diagnosis not present

## 2018-04-04 DIAGNOSIS — M79672 Pain in left foot: Secondary | ICD-10-CM

## 2018-04-04 DIAGNOSIS — M19072 Primary osteoarthritis, left ankle and foot: Secondary | ICD-10-CM | POA: Diagnosis not present

## 2018-04-04 HISTORY — DX: Pain in left foot: M79.672

## 2018-04-05 ENCOUNTER — Encounter (HOSPITAL_COMMUNITY): Payer: BLUE CROSS/BLUE SHIELD

## 2018-04-05 ENCOUNTER — Encounter (HOSPITAL_COMMUNITY)
Admission: RE | Admit: 2018-04-05 | Discharge: 2018-04-05 | Disposition: A | Discharge status: Home / Self Care | Payer: BLUE CROSS/BLUE SHIELD | Source: Ambulatory Visit | Attending: Cardiology | Admitting: Cardiology

## 2018-04-05 DIAGNOSIS — I2102 ST elevation (STEMI) myocardial infarction involving left anterior descending coronary artery: Secondary | ICD-10-CM

## 2018-04-05 DIAGNOSIS — Z6841 Body Mass Index (BMI) 40.0 and over, adult: Secondary | ICD-10-CM | POA: Diagnosis not present

## 2018-04-05 DIAGNOSIS — E559 Vitamin D deficiency, unspecified: Secondary | ICD-10-CM | POA: Diagnosis not present

## 2018-04-05 DIAGNOSIS — I2510 Atherosclerotic heart disease of native coronary artery without angina pectoris: Secondary | ICD-10-CM | POA: Diagnosis not present

## 2018-04-05 DIAGNOSIS — Z955 Presence of coronary angioplasty implant and graft: Secondary | ICD-10-CM

## 2018-04-05 DIAGNOSIS — E785 Hyperlipidemia, unspecified: Secondary | ICD-10-CM | POA: Diagnosis not present

## 2018-04-05 DIAGNOSIS — K760 Fatty (change of) liver, not elsewhere classified: Secondary | ICD-10-CM | POA: Diagnosis not present

## 2018-04-05 DIAGNOSIS — J45909 Unspecified asthma, uncomplicated: Secondary | ICD-10-CM | POA: Diagnosis not present

## 2018-04-05 DIAGNOSIS — Z7902 Long term (current) use of antithrombotics/antiplatelets: Secondary | ICD-10-CM | POA: Diagnosis not present

## 2018-04-05 DIAGNOSIS — Z7982 Long term (current) use of aspirin: Secondary | ICD-10-CM | POA: Diagnosis not present

## 2018-04-05 DIAGNOSIS — Z7951 Long term (current) use of inhaled steroids: Secondary | ICD-10-CM | POA: Diagnosis not present

## 2018-04-05 DIAGNOSIS — I10 Essential (primary) hypertension: Secondary | ICD-10-CM | POA: Diagnosis not present

## 2018-04-05 DIAGNOSIS — K219 Gastro-esophageal reflux disease without esophagitis: Secondary | ICD-10-CM | POA: Diagnosis not present

## 2018-04-05 DIAGNOSIS — Z96651 Presence of right artificial knee joint: Secondary | ICD-10-CM | POA: Diagnosis not present

## 2018-04-05 DIAGNOSIS — I252 Old myocardial infarction: Secondary | ICD-10-CM | POA: Diagnosis not present

## 2018-04-05 DIAGNOSIS — Z79899 Other long term (current) drug therapy: Secondary | ICD-10-CM | POA: Diagnosis not present

## 2018-04-08 ENCOUNTER — Encounter (HOSPITAL_COMMUNITY)
Admission: RE | Admit: 2018-04-08 | Discharge: 2018-04-08 | Disposition: A | Discharge status: Home / Self Care | Payer: BLUE CROSS/BLUE SHIELD | Source: Ambulatory Visit | Attending: Cardiology | Admitting: Cardiology

## 2018-04-08 ENCOUNTER — Encounter (HOSPITAL_COMMUNITY): Payer: BLUE CROSS/BLUE SHIELD

## 2018-04-08 VITALS — Ht 68.25 in | Wt 284.4 lb

## 2018-04-08 DIAGNOSIS — I10 Essential (primary) hypertension: Secondary | ICD-10-CM | POA: Diagnosis not present

## 2018-04-08 DIAGNOSIS — K219 Gastro-esophageal reflux disease without esophagitis: Secondary | ICD-10-CM | POA: Diagnosis not present

## 2018-04-08 DIAGNOSIS — Z955 Presence of coronary angioplasty implant and graft: Secondary | ICD-10-CM | POA: Diagnosis not present

## 2018-04-08 DIAGNOSIS — E785 Hyperlipidemia, unspecified: Secondary | ICD-10-CM | POA: Diagnosis not present

## 2018-04-08 DIAGNOSIS — Z79899 Other long term (current) drug therapy: Secondary | ICD-10-CM | POA: Diagnosis not present

## 2018-04-08 DIAGNOSIS — Z6841 Body Mass Index (BMI) 40.0 and over, adult: Secondary | ICD-10-CM | POA: Diagnosis not present

## 2018-04-08 DIAGNOSIS — Z7902 Long term (current) use of antithrombotics/antiplatelets: Secondary | ICD-10-CM | POA: Diagnosis not present

## 2018-04-08 DIAGNOSIS — I2510 Atherosclerotic heart disease of native coronary artery without angina pectoris: Secondary | ICD-10-CM | POA: Diagnosis not present

## 2018-04-08 DIAGNOSIS — Z7951 Long term (current) use of inhaled steroids: Secondary | ICD-10-CM | POA: Diagnosis not present

## 2018-04-08 DIAGNOSIS — E559 Vitamin D deficiency, unspecified: Secondary | ICD-10-CM | POA: Diagnosis not present

## 2018-04-08 DIAGNOSIS — I252 Old myocardial infarction: Secondary | ICD-10-CM | POA: Diagnosis not present

## 2018-04-08 DIAGNOSIS — Z96651 Presence of right artificial knee joint: Secondary | ICD-10-CM | POA: Diagnosis not present

## 2018-04-08 DIAGNOSIS — J45909 Unspecified asthma, uncomplicated: Secondary | ICD-10-CM | POA: Diagnosis not present

## 2018-04-08 DIAGNOSIS — Z7982 Long term (current) use of aspirin: Secondary | ICD-10-CM | POA: Diagnosis not present

## 2018-04-08 DIAGNOSIS — K760 Fatty (change of) liver, not elsewhere classified: Secondary | ICD-10-CM | POA: Diagnosis not present

## 2018-04-08 DIAGNOSIS — I2102 ST elevation (STEMI) myocardial infarction involving left anterior descending coronary artery: Secondary | ICD-10-CM

## 2018-04-15 NOTE — Progress Notes (Signed)
Discharge Progress Report   Patient Details  Name: Alyssa Phillips MRN: [id number] Date of Birth: Aug 29, 1955 Referring Provider:     [place] from 01/01/2018 in [place]  Referring Provider  Martinique, Peter, MD       Number of Visits: 35  Reason for Discharge:  Patient reached a stable level of exercise. Patient independent in their exercise. Patient has met program and personal goals.  Smoking History:  Social History   Tobacco Use  Smoking Status Never Smoker  Smokeless Tobacco Never Used    Diagnosis:  10/31/17 STEMI  10/31/17 DES LAD  ADL [facility]SD:   Initial Exercise Prescription: Initial Exercise Prescription - 01/01/18 1100      Date of Initial Exercise RX and Referring Provider   Date  01/01/18    Referring Provider  Martinique, Peter, MD      NuStep   Level  3    SPM  85    Minutes  10    METs  2.5      Arm Ergometer   Level  2    Minutes  10    METs  2.5      Track   Laps  10    Minutes  10    METs  2.74      Prescription Details   Frequency (times per week)  3    Duration  Progress to 30 minutes of continuous aerobic without signs/symptoms of physical distress      Intensity   THRR 40-80% of Max Heartrate  63-126    Ratings of Perceived Exertion  11-13    Perceived Dyspnea  0-4      Progression   Progression  Continue to progress workloads to maintain intensity without signs/symptoms of physical distress.      Resistance Training   Training Prescription  Yes    Weight  3lbs    Reps  10-15       Discharge Exercise Prescription (Final Exercise Prescription Changes): Exercise Prescription Changes - 04/08/18 1342      Response to Exercise   Blood Pressure (Admit)  114/70    Blood Pressure (Exercise)  118/70    Blood Pressure (Exit)  108/60    Heart Rate (Admit)  82 bpm    Heart Rate (Exercise)  100 bpm    Heart Rate (Exit)  82 bpm    Rating of Perceived Exertion (Exercise)  13     Perceived Dyspnea (Exercise)  0    Symptoms  Foot Pain    Comments  Pt graduated from Cardiac Rehab     Duration  Continue with 45 min of aerobic exercise without signs/symptoms of physical distress.    Intensity  THRR unchanged      Progression   Progression  Continue to progress workloads to maintain intensity without signs/symptoms of physical distress.    Average METs  2.6      Resistance Training   Training Prescription  Yes    Weight  3lbs    Reps  10-15    Time  10 Minutes      Interval Training   Interval Training  No      NuStep   Level  5    SPM  100    Minutes  15    METs  2.6      Arm Ergometer   Level  2    Minutes  15    METs  2      Home Exercise Plan   Plans to continue exercise at  Home (comment)   Walking   Frequency  Add 3 additional days to program exercise sessions.    Initial Home Exercises Provided  01/30/18       Functional Capacity: 6 Minute Walk    Row Name 01/01/18 0829 04/12/18 1343       6 Minute Walk   Phase  Initial  - Pt did not complete discharge 6MWT due to foot pain/injury    Distance  1204 feet  -    Walk Time  6 minutes  -    # of Rest Breaks  0  -    MPH  2.28  -    METS  2.24  -    RPE  11  -    VO2 Peak  7.84  -    Symptoms  Yes (comment)  -    Comments  Patient c/o feeling "breathy" after walk test.  -    Resting HR  100 bpm  -    Resting BP  98/60  -    Resting Oxygen Saturation   96 %  -    Exercise Oxygen Saturation  during 6 min walk  97 %  -    Max Ex. HR  131 bpm  -    Max Ex. BP  124/80  -    2 Minute Post BP  104/60  -       Psychological, QOL, Others - Outcomes: PHQ 2/9: Depression screen [facility] 2/9 04/08/2018 01/07/2018 12/04/2017  Decreased Interest 0 0 0  Down, Depressed, Hopeless 0 0 0  PHQ - 2 Score 0 0 0    Quality of Life: Quality of Life - 04/12/18 1349      Quality of Life Scores   Health/Function Pre  23.6 %    Health/Function Post  29.6 %    Health/Function % Change  25.42 %     Socioeconomic Pre  29.25 %    Socioeconomic Post  30 %    Socioeconomic % Change   2.56 %    Psych/Spiritual Pre  26.57 %    Psych/Spiritual Post  30 %    Psych/Spiritual % Change  12.91 %    Family Pre  28.8 %    Family Post  30 %    Family % Change  4.17 %    GLOBAL Pre  26.23 %    GLOBAL Post  29.82 %    GLOBAL % Change  13.69 %       Personal Goals: Goals established at orientation with interventions provided to work toward goal. Personal Goals and Risk Factors at Admission - 01/01/18 1228      Core Components/Risk Factors/Patient Goals on Admission    Weight Management  Obesity;Yes    Intervention  Weight Management: Develop a combined nutrition and exercise program designed to reach desired caloric intake, while maintaining appropriate intake of nutrient and fiber, sodium and fats, and appropriate energy expenditure required for the weight goal.;Weight Management: Provide education and appropriate resources to help participant work on and attain dietary goals.;Weight Management/Obesity: Establish reasonable short term and long term weight goals.;Obesity: Provide education and appropriate resources to help participant work on and attain dietary goals.    Admit Weight  312 lb 13.3 oz (141.9 kg)    Expected Outcomes  Short Term: Continue to assess and modify interventions until short term weight is achieved;Long  Term: Adherence to nutrition and physical activity/exercise program aimed toward attainment of established weight goal;Weight Maintenance: Understanding of the daily nutrition guidelines, which includes 25-35% calories from fat, 7% or less cal from saturated fats, less than 260m cholesterol, less than 1.5gm of sodium, & 5 or more servings of fruits and vegetables daily;Weight Loss: Understanding of general recommendations for a balanced deficit meal plan, which promotes 1-2 lb weight loss per week and includes a negative energy balance of [phone] kcal/d;Understanding  recommendations for meals to include 15-35% energy as protein, 25-35% energy from fat, 35-60% energy from carbohydrates, less than 2083mof dietary cholesterol, 20-35 gm of total fiber daily;Understanding of distribution of calorie intake throughout the day with the consumption of 4-5 meals/snacks    Hypertension  Yes    Intervention  Provide education on lifestyle modifcations including regular physical activity/exercise, weight management, moderate sodium restriction and increased consumption of fresh fruit, vegetables, and low fat dairy, alcohol moderation, and smoking cessation.;Monitor prescription use compliance.    Expected Outcomes  Short Term: Continued assessment and intervention until BP is < 140/9063mG in hypertensive participants. < 130/17m70m in hypertensive participants with diabetes, heart failure or chronic kidney disease.;Long Term: Maintenance of blood pressure at goal levels.    Lipids  Yes    Intervention  Provide education and support for participant on nutrition & aerobic/resistive exercise along with prescribed medications to achieve LDL <70mg39mL >40mg.17mExpected Outcomes  Short Term: Participant states understanding of desired cholesterol values and is compliant with medications prescribed. Participant is following exercise prescription and nutrition guidelines.;Long Term: Cholesterol controlled with medications as prescribed, with individualized exercise RX and with personalized nutrition plan. Value goals: LDL < 70mg, 67m> 40 mg.    Stress  Yes    Intervention  Offer individual and/or small group education and counseling on adjustment to heart disease, stress management and health-related lifestyle change. Teach and support self-help strategies.;Refer participants experiencing significant psychosocial distress to appropriate mental health specialists for further evaluation and treatment. When possible, include family members and significant others in education/counseling  sessions.    Expected Outcomes  Short Term: Participant demonstrates changes in health-related behavior, relaxation and other stress management skills, ability to obtain effective social support, and compliance with psychotropic medications if prescribed.;Long Term: Emotional wellbeing is indicated by absence of clinically significant psychosocial distress or social isolation.        Personal Goals Discharge: Goals and Risk Factor Review    Row Name 01/07/18 0901 01/17/18 0734 02/08/18 1354 03/04/18 1528 03/27/18 1518     Core Components/Risk Factors/Patient Goals Review   Personal Goals Review  Weight Management/Obesity;Lipids;Hypertension;Stress  Weight Management/Obesity;Lipids;Hypertension;Stress  Weight Management/Obesity;Lipids;Hypertension;Stress  Weight Management/Obesity;Lipids;Hypertension;Stress  Weight Management/Obesity;Lipids;Hypertension;Stress   Review  Pt with multiple CAD RFs willing to participate in CR exercise.  Pt would like to lose weight and get stronger.   Pt with multiple CAD RFs willing to participate in CR exercise.  Mtizi is off to a great start. She is tolerating exercise well.   Pt with multiple CAD RFs willing to participate in CR exercise.  Mtzi continues to tolerate exercise well.  She has great attendance.   Pt with multiple CAD RFs willing to participate in CR exercise.  Zailah feels that she is increasing her stamina.  She has great attendance but plans to be out to visit with family.   Pt with multiple CAD RFs willing to participate in CR exercise.  Taylr feels that she is increasing  her stamina.  She is having intermittent issues with left foot pain.  This has changed her exercise routine on days where this has been an issue.    Expected Outcomes  Pt will continue to participate in CR exercise, nutrition, and lifestyle modification opportunities.   Pt will continue to participate in CR exercise, nutrition, and lifestyle modification opportunities.   Pt will continue  to participate in CR exercise, nutrition, and lifestyle modification opportunities.   Pt will continue to participate in CR exercise, nutrition, and lifestyle modification opportunities.   Pt will continue to participate in CR exercise, nutrition, and lifestyle modification opportunities.    [place] Name 04/08/18 1703             Core Components/Risk Factors/Patient Goals Review   Personal Goals Review  Weight Management/Obesity;Lipids;Hypertension;Stress       Review  Pt graduated CR with 35 completed sessions.  She enjoyed participating in the program and feels that she increased her strength.        Expected Outcomes  Pt will continue to participate in exercise, nutrition, and lifestyle modification opportunities.           Exercise Goals and Review: Exercise Goals    Row Name 01/01/18 0820             Exercise Goals   Increase Physical Activity  Yes       Intervention  Provide advice, education, support and counseling about physical activity/exercise needs.;Develop an individualized exercise prescription for aerobic and resistive training based on initial evaluation findings, risk stratification, comorbidities and participant's personal goals.       Expected Outcomes  Short Term: Attend rehab on a regular basis to increase amount of physical activity.;Long Term: Exercising regularly at least 3-5 days a week.;Long Term: Add in home exercise to make exercise part of routine and to increase amount of physical activity.       Increase Strength and Stamina  Yes       Intervention  Provide advice, education, support and counseling about physical activity/exercise needs.;Develop an individualized exercise prescription for aerobic and resistive training based on initial evaluation findings, risk stratification, comorbidities and participant's personal goals.       Expected Outcomes  Short Term: Increase workloads from initial exercise prescription for resistance, speed, and METs.;Short Term:  Perform resistance training exercises routinely during rehab and add in resistance training at home;Long Term: Improve cardiorespiratory fitness, muscular endurance and strength as measured by increased METs and functional capacity (6MWT)       Able to understand and use rate of perceived exertion (RPE) scale  Yes       Intervention  Provide education and explanation on how to use RPE scale       Expected Outcomes  Short Term: Able to use RPE daily in rehab to express subjective intensity level;Long Term:  Able to use RPE to guide intensity level when exercising independently       Knowledge and understanding of Target Heart Rate Range (THRR)  Yes       Intervention  Provide education and explanation of THRR including how the numbers were predicted and where they are located for reference       Expected Outcomes  Short Term: Able to state/look up THRR;Long Term: Able to use THRR to govern intensity when exercising independently;Short Term: Able to use daily as guideline for intensity in rehab       Able to check pulse independently  Yes  Intervention  Provide education and demonstration on how to check pulse in carotid and radial arteries.;Review the importance of being able to check your own pulse for safety during independent exercise       Expected Outcomes  Short Term: Able to explain why pulse checking is important during independent exercise;Long Term: Able to check pulse independently and accurately       Understanding of Exercise Prescription  Yes       Intervention  Provide education, explanation, and written materials on patient's individual exercise prescription       Expected Outcomes  Short Term: Able to explain program exercise prescription;Long Term: Able to explain home exercise prescription to exercise independently          Exercise Goals Re-Evaluation: Exercise Goals Re-Evaluation    Row Name 01/30/18 1408 03/04/18 1411 03/27/18 1417 04/12/18 1346 04/12/18 1402      Exercise Goal Re-Evaluation   Exercise Goals Review  Increase Physical Activity;Able to understand and use rate of perceived exertion (RPE) scale;Knowledge and understanding of Target Heart Rate Range (THRR);Understanding of Exercise Prescription;Increase Strength and Stamina;Able to check pulse independently  Increase Physical Activity;Understanding of Exercise Prescription  Increase Physical Activity;Understanding of Exercise Prescription  Understanding of Exercise Prescription  Increase Physical Activity;Understanding of Exercise Prescription   Comments  Reveiwed HEP with pt. Also reviewed THRR, RPE Scale, weather conditions, endpoints of exercise, NTG use, warmup and cool down.   Reviewed METs and Goals with pt. Pt is putting for great effort while in rehab. Pt has increased her walking distance on the track while walking for 10 minutes. Pt is now able to get 16 laps. Pt also invested in new walking shoes and states that has really helped her while exercising.   Reviewed METs and Goals. Will follow up with pt regarding increasing Nustep level to 6 from 5, and arm crank level to 2.5. Despite foot limitations, pt puts forth great effort with exercise.   -  Pt completed 35 sessions of Cardiac Rehab. Although, pt was unable to complete post 6WMT, pt did achieve personal goals set at beginning of program. Pt wanted to become stronger, pt states she can do more and is able to participate in more things at work. Pt wanted to also lose weight, pt lost 28 lbs since starting cardiac rehab and now weighs 283 lbs   Expected Outcomes  Pt will plan to continue to walk 2-3 days a week for 15 minutes. Pt will gradually add a minute to progress to 30 minutes of walking. Pt does have rower and bike at home and will use those from time to time. Will continue to monitor and progress as tolerated.   Pt is continuing to walk for exercise 2-3 days a week for 15 minutes. Pt will continue to increase cardiovascular strength. Will  continue to monitor and progress pt as tolerated.   Pt will continue to walk 2-3 days for 20 minutes. Will continue to monitor and progress pt.   -  Pt will continue to walk 3-4 days for 20-30 minutes.      Nutrition & Weight - Outcomes: Pre Biometrics - 01/01/18 1155      Pre Biometrics   Height  5' 8.25" (1.734 m)    Weight  (!) 141.9 kg    Waist Circumference  50.5 inches    Hip Circumference  60 inches    Waist to Hip Ratio  0.84 %    BMI (Calculated)  47.19    Triceps  Skinfold  60 mm    % Body Fat  57.7 %    Grip Strength  36 kg    Flexibility  14 in    Single Leg Stand  4.18 seconds      Post Biometrics - 04/12/18 1405       Post  Biometrics   Height  5' 8.25" (1.734 m)    Weight  129 kg    Waist Circumference  46 inches    Hip Circumference  55 inches    Waist to Hip Ratio  0.84 %    BMI (Calculated)  42.9    Triceps Skinfold  58 mm    % Body Fat  53.7 %    Grip Strength  37 kg    Flexibility  14 in    Single Leg Stand  10 seconds       Nutrition: Nutrition Therapy & Goals - 04/15/18 0904      Nutrition Therapy   Diet  heart healthy, carb modified      Personal Nutrition Goals   Nutrition Goal  Pt to identify and limit food sources of saturated fat, trans fat, refined carbohydrates and sodium    Personal Goal #2  Pt to identify food quantities necessary to achieve weight loss of 6-24 lbs. at graduation from cardiac rehab.    Personal Goal #3  Pt able to name foods that affect blood glucose   nutrition goal met     Intervention Plan   Intervention  Prescribe, educate and counsel regarding individualized specific dietary modifications aiming towards targeted core components such as weight, hypertension, lipid management, diabetes, heart failure and other comorbidities.    Expected Outcomes  Short Term Goal: Understand basic principles of dietary content, such as calories, fat, sodium, cholesterol and nutrients.;Long Term Goal: Adherence to prescribed nutrition  plan.       Nutrition Discharge: Nutrition Assessments - 04/15/18 0902      MEDFICTS Scores   Pre Score  12    Post Score  0    Score Difference  -12       Education Questionnaire Score: Knowledge Questionnaire Score - 04/12/18 1349      Knowledge Questionnaire Score   Pre Score  21/24     Post Score  23/24       Goals reviewed with patient; copy given to patient.

## 2018-04-21 ENCOUNTER — Other Ambulatory Visit (HOSPITAL_COMMUNITY): Payer: Self-pay | Admitting: Cardiology

## 2018-04-23 ENCOUNTER — Other Ambulatory Visit (HOSPITAL_COMMUNITY): Payer: Self-pay | Admitting: Cardiology

## 2018-05-22 ENCOUNTER — Emergency Department (HOSPITAL_COMMUNITY): Payer: BLUE CROSS/BLUE SHIELD

## 2018-05-22 ENCOUNTER — Emergency Department (HOSPITAL_COMMUNITY)
Admission: EM | Admit: 2018-05-22 | Discharge: 2018-05-22 | Disposition: A | Discharge status: Home / Self Care | Payer: BLUE CROSS/BLUE SHIELD | Attending: Emergency Medicine | Admitting: Emergency Medicine

## 2018-05-22 ENCOUNTER — Other Ambulatory Visit: Payer: Self-pay

## 2018-05-22 ENCOUNTER — Encounter (HOSPITAL_COMMUNITY): Payer: Self-pay

## 2018-05-22 DIAGNOSIS — R0789 Other chest pain: Secondary | ICD-10-CM | POA: Diagnosis not present

## 2018-05-22 DIAGNOSIS — R05 Cough: Secondary | ICD-10-CM | POA: Diagnosis not present

## 2018-05-22 DIAGNOSIS — R079 Chest pain, unspecified: Secondary | ICD-10-CM | POA: Insufficient documentation

## 2018-05-22 DIAGNOSIS — Z79899 Other long term (current) drug therapy: Secondary | ICD-10-CM | POA: Insufficient documentation

## 2018-05-22 DIAGNOSIS — R42 Dizziness and giddiness: Secondary | ICD-10-CM | POA: Diagnosis not present

## 2018-05-22 DIAGNOSIS — J45909 Unspecified asthma, uncomplicated: Secondary | ICD-10-CM | POA: Diagnosis not present

## 2018-05-22 DIAGNOSIS — Z7982 Long term (current) use of aspirin: Secondary | ICD-10-CM | POA: Insufficient documentation

## 2018-05-22 DIAGNOSIS — I10 Essential (primary) hypertension: Secondary | ICD-10-CM | POA: Diagnosis not present

## 2018-05-22 DIAGNOSIS — R0902 Hypoxemia: Secondary | ICD-10-CM | POA: Diagnosis not present

## 2018-05-22 LAB — CBC
HCT: 44.5 % (ref 36.0–46.0)
Hemoglobin: 13.7 g/dL (ref 12.0–15.0)
MCH: 28.8 pg (ref 26.0–34.0)
MCHC: 30.8 g/dL (ref 30.0–36.0)
MCV: 93.5 fL (ref 80.0–100.0)
Platelets: 214 10*3/uL (ref 150–400)
RBC: 4.76 MIL/uL (ref 3.87–5.11)
RDW: 13.6 % (ref 11.5–15.5)
WBC: 5.9 10*3/uL (ref 4.0–10.5)
nRBC: 0 % (ref 0.0–0.2)

## 2018-05-22 LAB — BASIC METABOLIC PANEL
Anion gap: 8 (ref 5–15)
BUN: 18 mg/dL (ref 8–23)
CALCIUM: 10.4 mg/dL — AB (ref 8.9–10.3)
CO2: 24 mmol/L (ref 22–32)
Chloride: 109 mmol/L (ref 98–111)
Creatinine, Ser: 0.78 mg/dL (ref 0.44–1.00)
GFR calc Af Amer: >60 mL/min (ref >60)
GFR calc non Af Amer: >60 mL/min (ref >60)
Glucose, Bld: 91 mg/dL (ref 70–99)
Potassium: 4.4 mmol/L (ref 3.5–5.1)
Sodium: 141 mmol/L (ref 135–145)

## 2018-05-22 LAB — I-STAT TROPONIN, ED: Troponin i, poc: 0 ng/mL (ref 0.00–0.08)

## 2018-05-22 LAB — TROPONIN I: Troponin I: <0.03 ng/mL (ref <0.03)

## 2018-05-22 MED ORDER — LEVALBUTEROL HCL 0.63 MG/3ML IN NEBU: 0.6300 mg | INHALATION_SOLUTION | Freq: Four times a day (QID) | RESPIRATORY_TRACT | 1 refills | Status: DC | PRN

## 2018-05-22 MED ORDER — SODIUM CHLORIDE 0.9% FLUSH
3.0000 mL | Freq: Once | INTRAVENOUS | Status: AC
  Administered 2018-05-22: 3 mL via INTRAVENOUS

## 2018-05-22 NOTE — Discharge Instructions (Signed)
You were seen in the emergency department for an episode of chest pain that occurred while at work.  You had blood work EKG and chest x-ray that did not show any signs of obvious heart damage.  It will be important that you closely follow-up with your cardiologist.  Please continue your regular medications.  Return if any recurrence of your chest pain or other concerns.

## 2018-05-22 NOTE — ED Notes (Signed)
Patient verbalizes understanding of discharge instructions. Opportunity for questioning and answers were provided. Armband removed by staff, pt discharged from ED.  

## 2018-05-22 NOTE — ED Provider Notes (Signed)
[facility] EMERGENCY DEPARTMENT Provider Note   CSN: [id number] Arrival date & time: 05/22/18  1126     History   Chief Complaint Chief Complaint  Patient presents with  . Chest Pain    HPI Alyssa Phillips is a 63 y.o. female.  She has a history of cardiac disease and has had a cardiac stent to her LAD about 8 months ago.  She is complaining of acute onset of sharp stabbing substernal chest pain that was 8 out of 10 intensity that occurred around 1030 this morning.  She said it lasted about 10 minutes.  It improved after she took a nitro.  It was not associated with any diaphoresis shortness of breath nausea dizziness.  The pain was similar to when she had a cardiac event last summer.  At that time she did have diaphoresis and shortness of breath with it.  This is the first time she is taken nitro.  Currently she rates her pain a 0 out of 10.  The history is provided by the patient.  Chest Pain  Pain location:  Substernal area Pain quality: sharp and stabbing   Pain radiates to:  Mid back Pain severity:  Severe Onset quality:  Sudden Duration:  10 minutes Timing:  Rare Progression:  Resolved Chronicity:  New Relieved by:  Nitroglycerin Worsened by:  Nothing Ineffective treatments:  None tried Associated symptoms: cough   Associated symptoms: no abdominal pain, no diaphoresis, no fever, no headache, no nausea, no shortness of breath, no syncope and no vomiting   Cough:    Cough characteristics:  Non-productive   Severity:  Moderate   Onset quality:  Gradual   Timing:  Intermittent   Progression:  Unchanged   Chronicity:  New Risk factors: coronary artery disease, high cholesterol and hypertension     Past Medical History:  Diagnosis Date  . Allergic rhinitis   . Arthritis    Osteoarthrits-knees-hx. RTKA  . Asthma    environmental agents induced asthma  . Coronary artery disease   . Family history of adverse reaction to anesthesia    daughter  very sensitive to anesthesia  . GERD (gastroesophageal reflux disease)   . Heart murmur    hx. mitral valve proplapse-mostly asymptomatic-->not apprectiaed n echo  . Hepatic steatosis 03/05/2008   [place]  . Hyperlipidemia   . Hypertension   . Morbid obesity due to excess calories (HCC)   . STEMI (ST elevation myocardial infarction) (HCC) 10/2017   LAD treated with DESx1  . UTI (urinary tract infection)   . Vitamin D deficiency   . Vitamin D deficiency     Patient Active Problem List   Diagnosis Date Noted  . STEMI involving left anterior descending coronary artery (HCC) 12/04/2017  . Morbid obesity (HCC) 12/04/2017  . Essential hypertension 12/04/2017  . Encounter for general adult medical examination with abnormal findings 12/04/2017  . Moderate persistent asthma without complication 12/04/2017  . Hyperlipidemia   . OA (osteoarthritis) of knee 05/10/2012    Past Surgical History:  Procedure Laterality Date  . 5th finger surgery Right 1990   unknown injury  . CARDIAC CATHETERIZATION    . CHOLECYSTECTOMY    . COLONOSCOPY WITH PROPOFOL N/A 06/08/2016   Procedure: COLONOSCOPY WITH PROPOFOL;  Surgeon: Dorena Cookey, MD;  Location: [facility] ENDOSCOPY;  Service: Endoscopy;  Laterality: N/A;  . CORONARY STENT INTERVENTION N/A 11/01/2017   Procedure: CORONARY STENT INTERVENTION;  Surgeon: Swaziland, Peter M, MD;  Location: [facility] INVASIVE CV LAB;  Service: Cardiovascular;  Laterality: N/A;  Resolute Onyx 4.5 mm x12 mm  . CORONARY/GRAFT ACUTE MI REVASCULARIZATION N/A 11/01/2017   Procedure: Coronary/Graft Acute MI Revascularization;  Surgeon: Swaziland, Peter M, MD;  Location: [facility] INVASIVE CV LAB;  Service: Cardiovascular;  Laterality: N/A;  . KNEE ARTHROSCOPY  05-02-12   x2 right/ x1 left  . LEFT HEART CATH AND CORONARY ANGIOGRAPHY N/A 11/01/2017   Procedure: LEFT HEART CATH AND CORONARY ANGIOGRAPHY;  Surgeon: Swaziland, Peter M, MD;  Location: [facility] INVASIVE CV LAB;  Service: Cardiovascular;  Laterality: N/A;  .  REPLACEMENT TOTAL KNEE Right 10/2010  . TOTAL KNEE ARTHROPLASTY  05/10/2012   Procedure: TOTAL KNEE ARTHROPLASTY;  Surgeon: Loanne Drilling, MD;  Location: [facility] ORS;  Service: Orthopedics;  Laterality: Left;  . TUBAL LIGATION    . UMBILICAL HERNIA REPAIR       OB History    Gravida  3   Para  3   Term      Preterm      AB      Living  3     SAB      TAB      Ectopic      Multiple      Live Births               Home Medications    Prior to Admission medications   Medication Sig Start Date End Date Taking? Authorizing Provider  acetaminophen (TYLENOL) 325 MG tablet Take 325 mg by mouth every 6 (six) hours as needed.    [provider]  aspirin EC 81 MG EC tablet Take 1 tablet (81 mg total) by mouth daily. 11/03/17   Robbie Lis M, PA-C  atorvastatin (LIPITOR) 80 MG tablet TAKE 1 TABLET (80 MG TOTAL) BY MOUTH DAILY AT 6 PM. 04/24/18   [place], Peter M, MD  benzonatate (TESSALON) 100 MG capsule 100 mg 3 (three) times daily as needed.     [provider]  Bepotastine Besilate (BEPREVE) 1.5 % SOLN Place 1 drop into both eyes daily as needed. For itchy eyes    [provider]  budesonide-formoterol (SYMBICORT) 160-4.5 MCG/ACT inhaler Inhale 2 puffs into the lungs 2 (two) times daily.    [provider]  cholecalciferol (VITAMIN D) 1000 units tablet Take 1,000 Units by mouth 2 (two) times daily.    [provider]  diclofenac sodium (VOLTAREN) 1 % GEL as needed.     [provider]  levalbuterol Pauline Aus HFA) 45 MCG/ACT inhaler Inhale 2 puffs into the lungs every 4 (four) hours as needed for wheezing. 11/08/17   Azalee Course, PA  levalbuterol (XOPENEX) 0.63 MG/3ML nebulizer solution Take 0.63 mg by nebulization every 4 (four) hours as needed for wheezing or shortness of breath.    [provider]  levocetirizine (XYZAL) 5 MG tablet Take 5 mg by mouth every evening.    [provider]  lisinopril  (PRINIVIL,ZESTRIL) 5 MG tablet TAKE 1 TABLET BY MOUTH EVERY DAY 04/23/18   [place], Peter M, MD  metoprolol succinate (TOPROL-XL) 50 MG 24 hr tablet TAKE 1 TABLET (50 MG TOTAL) BY MOUTH DAILY. TAKE WITH OR IMMEDIATELY FOLLOWING A MEAL. 04/24/18   [place], Peter M, MD  mometasone (NASONEX) 50 MCG/ACT nasal spray Place 2 sprays into the nose every morning.    [provider]  montelukast (SINGULAIR) 10 MG tablet Take 10 mg by mouth at bedtime.    [provider]  Multiple Vitamin (MULTIVITAMIN) tablet Take  1 tablet by mouth daily.    [provider]  nitroGLYCERIN (NITROSTAT) 0.4 MG SL tablet Place 1 tablet (0.4 mg total) under the tongue every 5 (five) minutes as needed for chest pain. 11/02/17 11/02/18  Allayne ButcherSimmons, Brittainy M, PA-C  ticagrelor (BRILINTA) 90 MG TABS tablet Take 1 tablet (90 mg total) by mouth 2 (two) times daily. 11/02/17   Allayne ButcherSimmons, Brittainy M, PA-C    Family History Family History  Problem Relation Age of Onset  . Hypertension Mother   . Arthritis Mother   . Lymphoma Mother   . Asthma Mother   . Cancer Mother   . Hearing loss Mother   . Miscarriages / [place] Mother   . COPD Father   . Alcohol abuse Father   . Arthritis Father   . Hearing loss Father   . Hypertension Father   . Miscarriages / Stillbirths Sister   . Arthritis Maternal Grandmother   . Hearing loss Maternal Grandmother   . Breast cancer Maternal Grandmother [id number]  . Arthritis Maternal Grandfather   . Diabetes Maternal Grandfather   . Hypertension Maternal Grandfather   . Stroke Maternal Grandfather   . Arthritis Paternal Grandmother   . Diabetes Paternal Grandmother   . Hearing loss Paternal Grandmother   . Hearing loss Paternal Grandfather   . Learning disabilities Paternal Grandfather     Social History Social History   Tobacco Use  . Smoking status: Never Smoker  . Smokeless tobacco: Never Used  Substance Use Topics  . Alcohol use: Yes    Comment: occ. rare  . Drug  use: No     Allergies   Advil [ibuprofen]; Aspirin; and Compazine [prochlorperazine edisylate]   Review of Systems Review of Systems  Constitutional: Negative for diaphoresis and fever.  HENT: Negative for sore throat.   Eyes: Negative for visual disturbance.  Respiratory: Positive for cough. Negative for shortness of breath.   Cardiovascular: Positive for chest pain. Negative for syncope.  Gastrointestinal: Negative for abdominal pain, nausea and vomiting.  Genitourinary: Negative for dysuria.  Musculoskeletal: Negative for neck pain.  Skin: Negative for rash.  Neurological: Negative for headaches.     Physical Exam Updated Vital Signs BP (!) 150/90 (BP Location: Right Wrist)   Pulse (!) 59   Temp 98.1 F (36.7 C) (Oral)   Resp 13   Ht 5\' 9"  (1.753 m)   Wt 124.7 kg   SpO2 100%   BMI 40.61 kg/m   Physical Exam Vitals signs and nursing note reviewed.  Constitutional:      General: She is not in acute distress.    Appearance: She is well-developed.  HENT:     Head: Normocephalic and atraumatic.  Eyes:     Conjunctiva/sclera: Conjunctivae normal.  Neck:     Musculoskeletal: Neck supple.  Cardiovascular:     Rate and Rhythm: Normal rate and regular rhythm.     Heart sounds: No murmur.  Pulmonary:     Effort: Pulmonary effort is normal. No respiratory distress.     Breath sounds: Normal breath sounds.  Abdominal:     Palpations: Abdomen is soft.     Tenderness: There is no abdominal tenderness.  Musculoskeletal:        General: Tenderness and signs of injury present. No deformity.     Right lower leg: Edema present.     Left lower leg: She exhibits tenderness (over bruise). Edema present.  Skin:    General: Skin is warm and dry.  Capillary Refill: Capillary refill takes less than 2 seconds.  Neurological:     General: No focal deficit present.     Mental Status: She is alert and oriented to person, place, and time.      ED Treatments / Results    Labs (all labs ordered are listed, but only abnormal results are displayed) Labs Reviewed  BASIC METABOLIC PANEL - Abnormal; Notable for the following components:      Result Value   Calcium 10.4 (*)    All other components within normal limits  CBC  TROPONIN I  I-STAT TROPONIN, ED    EKG EKG Interpretation  Date/Time:  Wednesday May 22 2018 11:30:16 EST Ventricular Rate:  61 PR Interval:    QRS Duration: 107 QT Interval:  408 QTC Calculation: 411 R Axis:   25 Text Interpretation:  Sinus rhythm Low voltage, precordial leads RSR' in V1 or V2, right VCD or RVH similar to prior 7/19 Confirmed by Meridee Score [phone]) on 05/22/2018 11:42:41 AM   Radiology Dg Chest 2 View  Result Date: 05/22/2018 CLINICAL DATA:  Cough and chest pain EXAM: CHEST - 2 VIEW COMPARISON:  Sep 15, 2015 FINDINGS: There is slight scarring in the lingula. The lungs elsewhere are clear. Heart size and pulmonary vascularity are normal. No adenopathy. No bone lesions. IMPRESSION: Mild lingular scarring. No edema or consolidation. Stable cardiac silhouette. Electronically Signed   By: Bretta Bang III M.D.   On: 05/22/2018 12:27    Procedures Procedures (including critical care time)  Medications Ordered in ED Medications  sodium chloride flush (NS) 0.9 % injection 3 mL (3 mLs Intravenous Given 05/22/18 1146)     Initial Impression / Assessment and Plan / ED Course  I have reviewed the triage vital signs and the nursing notes.  Pertinent labs & imaging results that were available during my care of the patient were reviewed by me and considered in my medical decision making (see chart for details).  Clinical Course as of May 22 1817  Wed May 22, 2018  1333 I reviewed the patient's results with her.  She is asking for something to eat which I think is fine.  I put her in for a delta troponin.   [MB]    Clinical Course User Index [MB] Terrilee Files, MD     Final Clinical Impressions(s) /  ED Diagnoses   Final diagnoses:  Nonspecific chest pain    ED Discharge Orders    None       Terrilee Files, MD 05/22/18 [phone]

## 2018-05-22 NOTE — ED Triage Notes (Signed)
Pt with hx of MI in July, 2019, stent placed in LAD at that time.  Pt was at work and developed back pain that radiated to center of chest rated 8/10.  Pt took 1 SL NTG and pain went away within 5 min.  Pt reports this is the first time she has taken NTG for CP since the stent.  Pt is pain free upon arrival to ED, A&Ox4. VSS.

## 2018-05-31 DIAGNOSIS — J302 Other seasonal allergic rhinitis: Secondary | ICD-10-CM | POA: Insufficient documentation

## 2018-05-31 DIAGNOSIS — J45909 Unspecified asthma, uncomplicated: Secondary | ICD-10-CM | POA: Insufficient documentation

## 2018-06-05 ENCOUNTER — Ambulatory Visit: Payer: BLUE CROSS/BLUE SHIELD | Admitting: Physician Assistant

## 2018-06-24 DIAGNOSIS — J4530 Mild persistent asthma, uncomplicated: Secondary | ICD-10-CM | POA: Diagnosis not present

## 2018-06-24 DIAGNOSIS — R05 Cough: Secondary | ICD-10-CM | POA: Diagnosis not present

## 2018-06-24 DIAGNOSIS — J300 Vasomotor rhinitis: Secondary | ICD-10-CM | POA: Diagnosis not present

## 2018-06-24 DIAGNOSIS — H1045 Other chronic allergic conjunctivitis: Secondary | ICD-10-CM | POA: Diagnosis not present

## 2018-06-27 DIAGNOSIS — M19072 Primary osteoarthritis, left ankle and foot: Secondary | ICD-10-CM | POA: Diagnosis not present

## 2018-06-27 DIAGNOSIS — M79672 Pain in left foot: Secondary | ICD-10-CM | POA: Diagnosis not present

## 2018-06-28 ENCOUNTER — Telehealth: Payer: Self-pay

## 2018-06-28 MED ORDER — CANDESARTAN CILEXETIL 4 MG PO TABS: 4.0000 mg | ORAL_TABLET | Freq: Every day | ORAL | 6 refills | Status: DC

## 2018-06-28 NOTE — Telephone Encounter (Signed)
Spoke to patient she stated she has a dry cough since taking Lisinopril.Advised Dr.Jordan out of office today.I will send message to our pharmacist for advice.

## 2018-06-28 NOTE — Telephone Encounter (Signed)
Recommendation:  1. STOP taking lisinopril  2. START taking candesartan 4mg  daily (start 48 hour after last lisinopril dose)  3. Monitor BP 2-3 times per week for 4 weeks and call back if dose adjustment needed

## 2018-06-28 NOTE — Telephone Encounter (Signed)
Returned call to patient Alyssa Phillips's recommendation given.Advised to call back if B/P elevated.

## 2018-06-29 ENCOUNTER — Ambulatory Visit: Payer: BLUE CROSS/BLUE SHIELD | Admitting: Family Medicine

## 2018-08-06 ENCOUNTER — Telehealth: Payer: Self-pay

## 2018-08-06 NOTE — Telephone Encounter (Signed)
Virtual Visit Pre-Appointment Phone Call  "( Jayonna Garland), I am calling you today to discuss your upcoming appointment. We are currently trying to limit exposure to the virus that causes COVID-19 by seeing patients at home rather than in the office."  1. "What is the BEST phone number to call the day of the visit?" - [phone]  2. "Do you have or have access to (through a family member/friend) a smartphone with video capability that we can use for your visit?" a. If yes - list this number in appt notes as "cell" (if different from BEST phone #) and list the appointment type as a VIDEO visit in appointment notes  3. Confirm consent - "In the setting of the current Covid19 crisis, you are scheduled for a (video) visit with your provider on (May 4) at (9:40am ).  Just as we do with many in-office visits, in order for you to participate in this visit, we must obtain consent.  If you'd like, I can send this to your mychart (if signed up) or email for you to review.  Otherwise, I can obtain your verbal consent now.  All virtual visits are billed to your insurance company just like a normal visit would be.  By agreeing to a virtual visit, we'd like you to understand that the technology does not allow for your provider to perform an examination, and thus may limit your provider's ability to fully assess your condition. If your provider identifies any concerns that need to be evaluated in person, we will make arrangements to do so.  Finally, though the technology is pretty good, we cannot assure that it will always work on either your or our end, and in the setting of a video visit, we may have to convert it to a phone-only visit.  In either situation, we cannot ensure that we have a secure connection.  Are you willing to proceed?" STAFF: Did the patient verbally acknowledge consent to telehealth visit? Document YES/NO here: YES  4. Advise patient to be prepared - "Two hours prior to your appointment,  go ahead and check your blood pressure, pulse, oxygen saturation, and your weight (if you have the equipment to check those) and write them all down. When your visit starts, your provider will ask you for this information. If you have an Apple Watch or Kardia device, please plan to have heart rate information ready on the day of your appointment. Please have a pen and paper handy nearby the day of the visit as well."  5. Give patient instructions for MyChart download to smartphone OR Doximity/Doxy.me as below if video visit (depending on what platform provider is using)  6. Inform patient they will receive a phone call 15 minutes prior to their appointment time (may be from unknown caller ID) so they should be prepared to answer    TELEPHONE CALL NOTE  Abi B Mayse has been deemed a candidate for a follow-up tele-health visit to limit community exposure during the Covid-19 pandemic. I spoke with the patient via phone to ensure availability of phone/video source, confirm preferred email & phone number, and discuss instructions and expectations.  I reminded Icelyn B Wholey to be prepared with any vital sign and/or heart rhythm information that could potentially be obtained via home monitoring, at the time of her visit. I reminded Tere B Weaks to expect a phone call prior to her visit.  Benjamine Mola, CMA 08/06/2018 4:08 PM   INSTRUCTIONS FOR DOWNLOADING THE Earleen Reaper  APP TO SMARTPHONE  - The patient must first make sure to have activated MyChart and know their login information - If Apple, go to [employer] and type in MyChart in the search bar and download the app. If Android, ask patient to go to [employer] and type in [place] in the search bar and download the app. The app is free but as with any other app downloads, their phone may require them to verify saved payment information or Apple/Android password.  - The patient will need to then log into the app with their MyChart  username and password, and select [facility] as their healthcare provider to link the account. When it is time for your visit, go to the MyChart app, find appointments, and click Begin Video Visit. Be sure to Select Allow for your device to access the Microphone and Camera for your visit. You will then be connected, and your provider will be with you shortly.  **If they have any issues connecting, or need assistance please contact MyChart service desk [phone])**  **If using a computer, in order to ensure the best quality for their visit they will need to use either of the following Internet Browsers: [employer], or Google Chrome**  IF USING DOXIMITY or DOXY.ME - The patient will receive a link just prior to their visit by text.     FULL LENGTH CONSENT FOR TELE-HEALTH VISIT   I hereby voluntarily request, consent and authorize [facility] and its employed or contracted physicians, physician assistants, nurse practitioners or other licensed health care professionals (the Practitioner), to provide me with telemedicine health care services (the "Services") as deemed necessary by the treating Practitioner. I acknowledge and consent to receive the Services by the Practitioner via telemedicine. I understand that the telemedicine visit will involve communicating with the Practitioner through live audiovisual communication technology and the disclosure of certain medical information by electronic transmission. I acknowledge that I have been given the opportunity to request an in-person assessment or other available alternative prior to the telemedicine visit and am voluntarily participating in the telemedicine visit.  I understand that I have the right to withhold or withdraw my consent to the use of telemedicine in the course of my care at any time, without affecting my right to future care or treatment, and that the Practitioner or I may terminate the telemedicine visit at any  time. I understand that I have the right to inspect all information obtained and/or recorded in the course of the telemedicine visit and may receive copies of available information for a reasonable fee.  I understand that some of the potential risks of receiving the Services via telemedicine include:  Marland Kitchen Delay or interruption in medical evaluation due to technological equipment failure or disruption; . Information transmitted may not be sufficient (e.g. poor resolution of images) to allow for appropriate medical decision making by the Practitioner; and/or  . In rare instances, security protocols could fail, causing a breach of personal health information.  Furthermore, I acknowledge that it is my responsibility to provide information about my medical history, conditions and care that is complete and accurate to the best of my ability. I acknowledge that Practitioner's advice, recommendations, and/or decision may be based on factors not within their control, such as incomplete or inaccurate data provided by me or distortions of diagnostic images or specimens that may result from electronic transmissions. I understand that the practice of medicine is not an exact science and that Practitioner makes no  warranties or guarantees regarding treatment outcomes. I acknowledge that I will receive a copy of this consent concurrently upon execution via email to the email address I last provided but may also request a printed copy by calling the office of [facility].    I understand that my insurance will be billed for this visit.   I have read or had this consent read to me. . I understand the contents of this consent, which adequately explains the benefits and risks of the Services being provided via telemedicine.  . I have been provided ample opportunity to ask questions regarding this consent and the Services and have had my questions answered to my satisfaction. . I give my informed consent for the services  to be provided through the use of telemedicine in my medical care  By participating in this telemedicine visit I agree to the above.

## 2018-08-14 NOTE — Progress Notes (Deleted)
{Choose 1 Note Type (Telehealth Visit or Telephone Visit):[phone]}   Evaluation Performed:  Follow-up visit  Date:  08/14/2018   ID:  [name], [name] 1956-04-01, MRN [id number]  Patient Location: Home Provider Location: Home  PCP:  Natalia Leatherwood, DO  Cardiologist:  Emillia Weatherly [place], MD  Electrophysiologist:  None   Chief Complaint:  Follow up CAD  History of Present Illness:    Alyssa Phillips is a 63 y.o. female seen for follow up CAD. She has a past medical history of hypertension, HLD, asthma, reported history of mitral valve prolapse. She was admitted for anterior STEMI on 11/01/2017.  Patient was taken urgently to Cath Lab which showed severe single-vessel disease but 95% proximal LAD treated with DES x1.  EF 45 to 50% by LV gram.  Echocardiogram showed EF 40 to 45%. There was no evidence of MV prolapse.  Postprocedure, patient was placed on aspirin and Brilinta and the high-dose statin.  She was also placed on beta-blocker and ARB as well.   Apparently her son is currently finishing a fellowship program in cosmetic surgery and previously has a degree in maxillofacial surgery. She did complete Cardiac Rehab in December.   The patient {does/does not:200015} have symptoms concerning for COVID-19 infection (fever, chills, cough, or new shortness of breath).    Past Medical History:  Diagnosis Date  . Allergic rhinitis   . Arthritis    Osteoarthrits-knees-hx. RTKA  . Asthma    environmental agents induced asthma  . Coronary artery disease   . Family history of adverse reaction to anesthesia    daughter very sensitive to anesthesia  . GERD (gastroesophageal reflux disease)   . Heart murmur    hx. mitral valve proplapse-mostly asymptomatic-->not apprectiaed n echo  . Hepatic steatosis 03/05/2008   [place]  . Hyperlipidemia   . Hypertension   . Morbid obesity due to excess calories (HCC)   . STEMI (ST elevation myocardial infarction) (HCC) 10/2017   LAD treated with DESx1  .  UTI (urinary tract infection)   . Vitamin D deficiency   . Vitamin D deficiency    Past Surgical History:  Procedure Laterality Date  . 5th finger surgery Right 1990   unknown injury  . CARDIAC CATHETERIZATION    . CHOLECYSTECTOMY    . COLONOSCOPY WITH PROPOFOL N/A 06/08/2016   Procedure: COLONOSCOPY WITH PROPOFOL;  Surgeon: Dorena Cookey, MD;  Location: [facility] ENDOSCOPY;  Service: Endoscopy;  Laterality: N/A;  . CORONARY STENT INTERVENTION N/A 11/01/2017   Procedure: CORONARY STENT INTERVENTION;  Surgeon: Sw[place], Oma Marzan M, MD;  Location: [facility] INVASIVE CV LAB;  Service: Cardiovascular;  Laterality: N/A;  Resolute Onyx 4.5 mm x12 mm  . CORONARY/GRAFT ACUTE MI REVASCULARIZATION N/A 11/01/2017   Procedure: Coronary/Graft Acute MI Revascularization;  Surgeon: [place], Enid Maultsby M, MD;  Location: [facility] INVASIVE CV LAB;  Service: Cardiovascular;  Laterality: N/A;  . KNEE ARTHROSCOPY  05-02-12   x2 right/ x1 left  . LEFT HEART CATH AND CORONARY ANGIOGRAPHY N/A 11/01/2017   Procedure: LEFT HEART CATH AND CORONARY ANGIOGRAPHY;  Surgeon: [place], Marquize Seib M, MD;  Location: [facility] INVASIVE CV LAB;  Service: Cardiovascular;  Laterality: N/A;  . REPLACEMENT TOTAL KNEE Right 10/2010  . TOTAL KNEE ARTHROPLASTY  05/10/2012   Procedure: TOTAL KNEE ARTHROPLASTY;  Surgeon: Loanne Drilling, MD;  Location: [facility] ORS;  Service: Orthopedics;  Laterality: Left;  . TUBAL LIGATION    . UMBILICAL HERNIA REPAIR       No outpatient medications have been marked  as taking for the 08/19/18 encounter (Appointment) with [place]Jordan, Jaycelynn Knickerbocker M, MD.     Allergies:   Compazine [prochlorperazine edisylate]; Advil [ibuprofen]; and Aspirin   Social History   Tobacco Use  . Smoking status: Never Smoker  . Smokeless tobacco: Never Used  Substance Use Topics  . Alcohol use: Yes    Comment: occ. rare  . Drug use: No     Family Hx: The patient's family history includes Alcohol abuse in her father; Arthritis in her father, maternal grandfather, maternal  grandmother, mother, and paternal grandmother; Asthma in her mother; Breast cancer (age of onset: 7070) in her maternal grandmother; COPD in her father; Cancer in her mother; Diabetes in her maternal grandfather and paternal grandmother; Hearing loss in her father, maternal grandmother, mother, paternal grandfather, and paternal grandmother; Hypertension in her father, maternal grandfather, and mother; Learning disabilities in her paternal grandfather; Lymphoma in her mother; Miscarriages / Stillbirths in her mother and sister; Stroke in her maternal grandfather.  ROS:   Please see the history of present illness.    *** All other systems reviewed and are negative.   Prior CV studies:   The following studies were reviewed today:  ***  Labs/Other Tests and Data Reviewed:    EKG:  {EKG/Telemetry Strips Reviewed:[id number]}  Recent Labs: 12/04/2017: TSH 2.11 01/02/2018: ALT 20 05/22/2018: BUN 18; Creatinine, Ser 0.78; Hemoglobin 13.7; Platelets 214; Potassium 4.4; Sodium 141   Recent Lipid Panel Lab Results  Component Value Date/Time   CHOL 100 01/02/2018 08:05 AM   TRIG 101 01/02/2018 08:05 AM   HDL 36 (L) 01/02/2018 08:05 AM   CHOLHDL 2.8 01/02/2018 08:05 AM   CHOLHDL 4.9 11/01/2017 02:01 AM   LDLCALC 44 01/02/2018 08:05 AM    Wt Readings from Last 3 Encounters:  05/22/18 275 lb (124.7 kg)  04/12/18 284 lb 6.3 oz (129 kg)  02/08/18 (!) 300 lb 12.8 oz (136.4 kg)     Objective:    Vital Signs:  There were no vitals taken for this visit.   {[facility] Virtual Exam (Optional):[phone]::"VITAL SIGNS:  reviewed"}  ASSESSMENT & PLAN:     1. CAD: s/p  anterior MI 11/01/17, underwent DES to LAD.  no other residual disease. Continue DAPT for one year. Continue  high-dose statin.  On beta blocker and ACEi.   2. Hyperlipidemia: Continue Lipitor. Recent LDL went from  166>> 44  3. Hypertension: Blood pressure well controlled.  Continue lisinopril and Toprol-XL given the mild LV  dysfunction that was noted on the recent echocardiogram  4. Asthma stable  5.   Obesity. Continue efforts at weight loss.    COVID-19 Education: The signs and symptoms of COVID-19 were discussed with the patient and how to seek care for testing (follow up with PCP or arrange E-visit).  ***The importance of social distancing was discussed today.  Time:   Today, I have spent *** minutes with the patient with telehealth technology discussing the above problems.     Medication Adjustments/Labs and Tests Ordered: Current medicines are reviewed at length with the patient today.  Concerns regarding medicines are outlined above.   Tests Ordered: No orders of the defined types were placed in this encounter.   Medication Changes: No orders of the defined types were placed in this encounter.   Disposition:  Follow up {follow up:15908}  Signed, Mark Benecke [place], MD  08/14/2018 10:56 AM    [facility]

## 2018-08-16 ENCOUNTER — Telehealth: Payer: Self-pay | Admitting: Cardiology

## 2018-08-16 NOTE — Telephone Encounter (Signed)
[phone]/ consent/ my chart/ pre reg completed

## 2018-08-19 ENCOUNTER — Telehealth: Payer: BLUE CROSS/BLUE SHIELD | Admitting: Cardiology

## 2018-08-19 ENCOUNTER — Telehealth: Payer: Self-pay

## 2018-08-19 NOTE — Telephone Encounter (Signed)
Called patient no answer.Left message on personal voice mail appointment with Dr.Jordan this morning is cancelled.Advised to call back to reschedule.

## 2018-10-10 ENCOUNTER — Telehealth: Payer: Self-pay | Admitting: Family Medicine

## 2018-10-10 DIAGNOSIS — E782 Mixed hyperlipidemia: Secondary | ICD-10-CM

## 2018-10-10 NOTE — Telephone Encounter (Signed)
FYI Patient called in requesting to have labs drawn for her 10/25/18 appointment with her cardiologist at [facility]. Advised patient to call the cardiology office to schedule a lab visit.

## 2018-10-10 NOTE — Telephone Encounter (Signed)
Spoke with pt, there are no lab orders in her chart so not sure what labs are needed prior to office visit if any. The patient also wants to know with her history of heart and asthma makes her more at risk to get the virus. 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 Patient made aware dr Martinique is out of the office and will be back in touch next week. Will forward to dr Martinique to review and advise.

## 2018-10-11 NOTE — Telephone Encounter (Signed)
I think we would want a CMET and lipid panel done. Her primary care may want other labs. She is at increased risk of complications from Covid due to history of asthma, CAD, HTN, obesity. She should maintain social distance and mask. 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   Peter Martinique MD, [facility]

## 2018-10-15 NOTE — Telephone Encounter (Signed)
Spoke with pt, aware of dr Doug Sou recommendations. Lab orders mailed to the pt and copy of this note faxed to patient at her request.

## 2018-10-22 ENCOUNTER — Telehealth: Payer: Self-pay | Admitting: Cardiology

## 2018-10-22 NOTE — Telephone Encounter (Signed)
New Message          [facility] Pre-operative Risk Assessment    Request for surgical clearance:  1. What type of surgery is being performed? Cyst Removal  2. When is this surgery scheduled? TBD  3. What type of clearance is required (medical clearance vs. Pharmacy clearance to hold med vs. Both)? Medical Clearance   4. Are there any medications that need to be held prior to surgery and how long? Plavix and Aspirin for 7 days prior  5. Practice name and name of physician performing surgery? [facility] Dr. Glenford Peers  6. What is your office phone number [phone]   7.   What is your office fax number [phone]  8.   Anesthesia type (None, local, MAC, general) ? Local    Andree Coss 10/22/2018, 4:47 PM  _________________________________________________________________   (provider comments below)

## 2018-10-22 NOTE — Progress Notes (Signed)
Virtual Visit via Telephone Note   This visit type was conducted due to national recommendations for restrictions regarding the COVID-19 Pandemic (e.g. social distancing) in an effort to limit this patient's exposure and mitigate transmission in our community.  Due to her co-morbid illnesses, this patient is at least at moderate risk for complications without adequate follow up.  This format is felt to be most appropriate for this patient at this time.  The patient did not have access to video technology/had technical difficulties with video requiring transitioning to audio format only (telephone).  All issues noted in this document were discussed and addressed.  No physical exam could be performed with this format.  Please refer to the patient's chart for her  consent to telehealth for [facility].   Date:  10/25/2018   ID:  Alyssa DakinMitzi B Phillips, DOB 02-26-1956, MRN [id number]  Patient Location: Home Provider Location: Home  PCP:  Natalia LeatherwoodKuneff, Renee A, DO  Cardiologist:  Aerianna Losey [place]Jordan, MD Electrophysiologist:  None   Evaluation Performed:  Follow-Up Visit  Chief Complaint:  Follow up CAD  History of Present Illness:    Alyssa Phillips is a 63 y.o. female with past medical history of hypertension, HLD, asthma, reported history of mitral valve prolapse who was  admitted for anterior STEMI on 11/01/2017.  Patient was taken urgently to Cath Lab which showed severe single-vessel disease but 95% proximal LAD treated with DES x1.  EF 45 to 50% by LV gram.  Echocardiogram showed EF 40 to 45%. There was no evidence of MV prolapse.  Postprocedure, patient was placed on aspirin and Brilinta and the high-dose statin.  She was also placed on beta-blocker and ARB as well.   Apparently her son is currently finishing a fellowship program in cosmetic surgery and previously has a degree in maxillofacial surgery.   In March 2020 she developed a dry cough felt to be related to lisinopril. She was switched to  Candesartan. She states the cough has improved. She has developed a furuncle on her back that has become infected. Is planning to have this removed. She has done very well with her lifestyle modification and has lost 45 lbs. She exercising 30 minutes a day.  The patient does not have symptoms concerning for COVID-19 infection (fever, chills, cough, or new shortness of breath).    Past Medical History:  Diagnosis Date  . Allergic rhinitis   . Arthritis    Osteoarthrits-knees-hx. RTKA  . Asthma    environmental agents induced asthma  . Coronary artery disease   . Family history of adverse reaction to anesthesia    daughter very sensitive to anesthesia  . GERD (gastroesophageal reflux disease)   . Heart murmur    hx. mitral valve proplapse-mostly asymptomatic-->not apprectiaed n echo  . Hepatic steatosis 03/05/2008   [place]  . Hyperlipidemia   . Hypertension   . Morbid obesity due to excess calories (HCC)   . STEMI (ST elevation myocardial infarction) (HCC) 10/2017   LAD treated with DESx1  . UTI (urinary tract infection)   . Vitamin D deficiency   . Vitamin D deficiency    Past Surgical History:  Procedure Laterality Date  . 5th finger surgery Right 1990   unknown injury  . CARDIAC CATHETERIZATION    . CHOLECYSTECTOMY    . COLONOSCOPY WITH PROPOFOL N/A 06/08/2016   Procedure: COLONOSCOPY WITH PROPOFOL;  Surgeon: Dorena CookeyJohn Hayes, MD;  [facility];  Service: Endoscopy;  Laterality: N/A;  . CORONARY STENT INTERVENTION  N/A 11/01/2017   Procedure: CORONARY STENT INTERVENTION;  Surgeon: SwazilandJordan, Lakeria Starkman M, MD;  [facility];  Service: Cardiovascular;  Laterality: N/A;  Resolute Onyx 4.5 mm x12 mm  . CORONARY/GRAFT ACUTE MI REVASCULARIZATION N/A 11/01/2017   Procedure: Coronary/Graft Acute MI Revascularization;  Surgeon: SwazilandJordan, Doyl Bitting M, MD;  [facility];  Service: Cardiovascular;  Laterality: N/A;  . KNEE ARTHROSCOPY  05-02-12   x2 right/ x1 left  . LEFT  HEART CATH AND CORONARY ANGIOGRAPHY N/A 11/01/2017   Procedure: LEFT HEART CATH AND CORONARY ANGIOGRAPHY;  Surgeon: SwazilandJordan, Maripaz Mullan M, MD;  [facility] LAB;  Service: Cardiovascular;  Laterality: N/A;  . REPLACEMENT TOTAL KNEE Right 10/2010  . TOTAL KNEE ARTHROPLASTY  05/10/2012   Procedure: TOTAL KNEE ARTHROPLASTY;  Surgeon: Loanne DrillingFrank V Aluisio, MD;  Location: [facility] ORS;  Service: Orthopedics;  Laterality: Left;  . TUBAL LIGATION    . UMBILICAL HERNIA REPAIR       Current Meds  Medication Sig  . Acetaminophen 500 MG coapsule Take 500-1,000 mg by mouth every 6 (six) hours as needed (for pain or headaches).  Marland Kitchen. aspirin EC 81 MG EC tablet Take 1 tablet (81 mg total) by mouth daily.  Marland Kitchen. atorvastatin (LIPITOR) 80 MG tablet TAKE 1 TABLET (80 MG TOTAL) BY MOUTH DAILY AT 6 PM.  . Bepotastine Besilate (BEPREVE) 1.5 % SOLN Place 1 drop into both eyes daily as needed. For itchy eyes  . budesonide-formoterol (SYMBICORT) 160-4.5 MCG/ACT inhaler Inhale 2 puffs into the lungs 2 (two) times daily.  . candesartan (ATACAND) 4 MG tablet Take 1 tablet (4 mg total) by mouth daily.  . cholecalciferol (VITAMIN D) 1000 units tablet Take 1,000 Units by mouth 2 (two) times daily.  . diclofenac sodium (VOLTAREN) 1 % GEL Apply 2-4 g topically daily as needed (as directed to painful sites).   Marland Kitchen. ipratropium (ATROVENT) 0.03 % nasal spray Place 2 sprays into both nostrils daily.  Marland Kitchen. levalbuterol (XOPENEX HFA) 45 MCG/ACT inhaler Inhale 2 puffs into the lungs every 4 (four) hours as needed for wheezing.  . levalbuterol (XOPENEX) 0.63 MG/3ML nebulizer solution Take 3 mLs (0.63 mg total) by nebulization every 6 (six) hours as needed for wheezing or shortness of breath.  . levocetirizine (XYZAL) 5 MG tablet Take 5 mg by mouth every evening.  . loratadine (CLARITIN) 10 MG tablet Take 10 mg by mouth daily.  . metoprolol succinate (TOPROL-XL) 50 MG 24 hr tablet TAKE 1 TABLET (50 MG TOTAL) BY MOUTH DAILY. TAKE WITH OR IMMEDIATELY  FOLLOWING A MEAL.  . mometasone (NASONEX) 50 MCG/ACT nasal spray Place 2 sprays into the nose every morning.  . montelukast (SINGULAIR) 10 MG tablet Take 10 mg by mouth at bedtime.  . Multiple Vitamin (MULTIVITAMIN) tablet Take 1 tablet by mouth daily.  . nitroGLYCERIN (NITROSTAT) 0.4 MG SL tablet Place 1 tablet (0.4 mg total) under the tongue every 5 (five) minutes as needed for chest pain.  . ticagrelor (BRILINTA) 90 MG TABS tablet Take 1 tablet (90 mg total) by mouth 2 (two) times daily.     Allergies:   Compazine [prochlorperazine edisylate], Advil [ibuprofen], and Aspirin   Social History   Tobacco Use  . Smoking status: Never Smoker  . Smokeless tobacco: Never Used  Substance Use Topics  . Alcohol use: Yes    Comment: occ. rare  . Drug use: No     Family Hx: The patient's family history includes Alcohol abuse in her father; Arthritis  in her father, maternal grandfather, maternal grandmother, mother, and paternal grandmother; Asthma in her mother; Breast cancer (age of onset: 65) in her maternal grandmother; COPD in her father; Cancer in her mother; Diabetes in her maternal grandfather and paternal grandmother; Hearing loss in her father, maternal grandmother, mother, paternal grandfather, and paternal grandmother; Hypertension in her father, maternal grandfather, and mother; Learning disabilities in her paternal grandfather; Lymphoma in her mother; Miscarriages / Stillbirths in her mother and sister; Stroke in her maternal grandfather.  ROS:   Please see the history of present illness.    All other systems reviewed and are negative.   Prior CV studies:   The following studies were reviewed today:  Cath: 11/01/17   Ost 1st Mrg lesion is 40% stenosed.  Prox LAD lesion is 95% stenosed.  Post intervention, there is a 0% residual stenosis.  A drug-eluting stent was successfully placed using a STENT RESOLUTE ONYX 4.5X12.  There is mild left ventricular systolic dysfunction.   LV end diastolic pressure is mildly elevated.  The left ventricular ejection fraction is 50-55% by visual estimate.  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 Mildly impaired LV function. EF 45-50% 3. Mildly elevated LVEDP 4. Successful PCI of the proximal LAD with DES x 1   Recommend uninterrupted dual antiplatelet therapy with Aspirin 81mg  daily and Ticagrelor 90mg  twice daily for a minimum of 12 months (ACS - Class I recommendation).  2D echo 11/01/17 Study Conclusions  - Left ventricle: The cavity size was normal. There was mild focal basal hypertrophy of the septum. Systolic function was mildly to moderately reduced. The estimated ejection fraction was in the range of 40% to 45%. Wall motion abnormalities noted below. The study is indeterminate for the evaluation of LV diastolic function. Acoustic contrast opacification revealed no evidence ofthrombus. - Regional wall motion abnormality: Akinesis of the apical anterior and apical myocardium; hypokinesis of the mid anterior, mid anteroseptal, apical septal, and apical lateral myocardium. - Aortic valve: There was no regurgitation. - Mitral valve: Structurally normal valve. No echocardiographic evidence for prolapse. Transvalvular velocity was within the normal range. There was no evidence for stenosis. There was no regurgitation. - Tricuspid valve: There was no significant regurgitation. - Pulmonic valve: There was no significant regurgitation.  Impressions:  - -LV EF mild to moderately reduced with focal wall motion abnormalities. Hypokinesis of anterior and anteroseptal walls and apex. Echo contrast used to better evaluate wall motion and exclude LV thrombus. -No evidence of mitral valve prolapse or regurgitation. -Technically difficult study. Right sided structures not well visualized but appear grossly normal.    Labs/Other Tests and Data Reviewed:    EKG:   No ECG reviewed.  Recent Labs: 12/04/2017: TSH 2.11 05/22/2018: Hemoglobin 13.7; Platelets 214 10/23/2018: ALT 14; BUN 15; Creatinine, Ser 0.73; Potassium 4.4; Sodium 139   Recent Lipid Panel Lab Results  Component Value Date/Time   CHOL 108 10/23/2018 10:48 AM   TRIG 48 10/23/2018 10:48 AM   HDL 45 10/23/2018 10:48 AM   CHOLHDL 2.4 10/23/2018 10:48 AM   CHOLHDL 4.9 11/01/2017 02:01 AM   LDLCALC 53 10/23/2018 10:48 AM    Wt Readings from Last 3 Encounters:  10/25/18 255 lb (115.7 kg)  05/22/18 275 lb (124.7 kg)  04/12/18 284 lb 6.3 oz (129 kg)     Objective:    Vital Signs:  BP 117/78   Pulse 60   Ht 5\' 9"  (1.753 m)   Wt 255 lb (115.7 kg)   BMI  37.66 kg/m    VITAL SIGNS:  reviewed  ASSESSMENT & PLAN:    1. CAD: s/p  anterior MI 11/01/17, underwent DES to LAD.  no other residual disease. She may stop Brilinta at this point. Continue ASA 81 mg daily. Continue  high-dose statin.  On beta blocker and ARB.  2. Hyperlipidemia: Continue Lipitor. Recent LDL went from  166>> 53  3. Hypertension: Blood pressure well controlled.  Continue candesartan and Toprol-XL given the mild LV dysfunction that was noted on prior echocardiogram  4. Asthma stable    5. Obesity. Continue efforts at weight loss. Excellent efforts so far.    COVID-19 Education: The signs and symptoms of COVID-19 were discussed with the patient and how to seek care for testing (follow up with PCP or arrange E-visit).  The importance of social distancing was discussed today.  Time:   Today, I have spent 15 minutes with the patient with telehealth technology discussing the above problems.     Medication Adjustments/Labs and Tests Ordered: Current medicines are reviewed at length with the patient today.  Concerns regarding medicines are outlined above.   Tests Ordered: No orders of the defined types were placed in this encounter.   Medication Changes: No orders of the defined types were placed in this  encounter.   Follow Up:  In Person in 6 month(s)  Signed, Kaiven Vester [place]Jordan, MD  10/25/2018 9:08 AM    [facility]

## 2018-10-23 ENCOUNTER — Telehealth: Payer: Self-pay

## 2018-10-23 ENCOUNTER — Other Ambulatory Visit: Payer: Self-pay

## 2018-10-23 DIAGNOSIS — E782 Mixed hyperlipidemia: Secondary | ICD-10-CM

## 2018-10-23 DIAGNOSIS — Z79899 Other long term (current) drug therapy: Secondary | ICD-10-CM | POA: Diagnosis not present

## 2018-10-23 LAB — LIPID PANEL
Chol/HDL Ratio: 2.4 ratio (ref 0.0–4.4)
Cholesterol, Total: 108 mg/dL (ref 100–199)
HDL: 45 mg/dL (ref >39)
LDL Calculated: 53 mg/dL (ref 0–99)
Triglycerides: 48 mg/dL (ref 0–149)
VLDL Cholesterol Cal: 10 mg/dL (ref 5–40)

## 2018-10-23 LAB — COMPREHENSIVE METABOLIC PANEL
ALT: 14 IU/L (ref 0–32)
AST: 16 IU/L (ref 0–40)
Albumin/Globulin Ratio: 2.4 — ABNORMAL HIGH (ref 1.2–2.2)
Albumin: 4 g/dL (ref 3.8–4.8)
Alkaline Phosphatase: 67 IU/L (ref 39–117)
BUN/Creatinine Ratio: 21 (ref 12–28)
BUN: 15 mg/dL (ref 8–27)
Bilirubin Total: 0.6 mg/dL (ref 0.0–1.2)
CO2: 21 mmol/L (ref 20–29)
Calcium: 10.2 mg/dL (ref 8.7–10.3)
Chloride: 106 mmol/L (ref 96–106)
Creatinine, Ser: 0.73 mg/dL (ref 0.57–1.00)
GFR calc Af Amer: 101 mL/min/{1.73_m2} (ref >59)
GFR calc non Af Amer: 88 mL/min/{1.73_m2} (ref >59)
Globulin, Total: 1.7 g/dL (ref 1.5–4.5)
Glucose: 88 mg/dL (ref 65–99)
Potassium: 4.4 mmol/L (ref 3.5–5.2)
Sodium: 139 mmol/L (ref 134–144)
Total Protein: 5.7 g/dL — ABNORMAL LOW (ref 6.0–8.5)

## 2018-10-23 NOTE — Telephone Encounter (Signed)
Pt was called, detailed message was left on VM that we are unable to draw labs for cardiology. She would have to go where cardiology was advising pt to go.  Pt was advised to make appt for cyst and Dr Raoul Pitch would have to assess the cyst to see if she was able to treat or if a referral would need to be made to another MD.

## 2018-10-23 NOTE — Telephone Encounter (Signed)
Pt Left VM stating she had blood work that needed to be done by cardiology and was hoping to have blood drawn at this location. Pt also has cyst on back that is tender and would like to know if Dr Raoul Pitch can lance the cyst for her. She is concerned to have this done due to being on blood thinners.  Call pt back at [phone].

## 2018-10-23 NOTE — Telephone Encounter (Signed)
Cyst removal is usually consider very low risk procedure. Per [employer], the location of cyst is in the middle of her back. Last PCI was for anterior MI on 11/01/2017. She almost completed a 12 month course. Patient likely can start holding aspirin and Brilinta after 7/18. Since she is scheduled to see Dr. Martinique in 2 days, will defer to Dr. Martinique if it is ok for her to hold aspirin and Brilinta for 7 days prior to the procedure and decide whether to discontinue brilinta, switch brilinta to plavix or reduce the dose of brilinta to 60mg  BID after her 1 year course.

## 2018-10-24 NOTE — Telephone Encounter (Signed)
I called pt to confirm her appt for 10-25-18 with Dr Martinique.

## 2018-10-25 ENCOUNTER — Encounter: Payer: Self-pay | Admitting: Cardiology

## 2018-10-25 ENCOUNTER — Telehealth (INDEPENDENT_AMBULATORY_CARE_PROVIDER_SITE_OTHER): Payer: BC Managed Care – PPO | Admitting: Cardiology

## 2018-10-25 ENCOUNTER — Other Ambulatory Visit: Payer: Self-pay | Admitting: Family Medicine

## 2018-10-25 VITALS — BP 117/78 | HR 60 | Ht 69.0 in | Wt 255.0 lb

## 2018-10-25 DIAGNOSIS — I10 Essential (primary) hypertension: Secondary | ICD-10-CM

## 2018-10-25 DIAGNOSIS — E782 Mixed hyperlipidemia: Secondary | ICD-10-CM

## 2018-10-25 DIAGNOSIS — I2510 Atherosclerotic heart disease of native coronary artery without angina pectoris: Secondary | ICD-10-CM | POA: Diagnosis not present

## 2018-10-25 NOTE — Patient Instructions (Addendum)
You may stop Brilinta   Continue your other therapy  followup 6 months    Call 3 months before to schedule

## 2018-11-15 ENCOUNTER — Other Ambulatory Visit: Payer: Self-pay | Admitting: Cardiology

## 2018-11-15 NOTE — Telephone Encounter (Signed)
Rx request sent to pharmacy.  

## 2018-12-05 NOTE — Telephone Encounter (Signed)
Spoke with Alyssa Phillips, she drank coffee before going to bed and that is what she thinks caused the indigestion. But she has had this indigestion a couple other times but not sure related to food. It is a shape, stabbing pain in the center of her chest and after forcing herself to burp several times the pain went away.. After the sharpe pain the chest is sore when rolling over in bed or palpation. She was told by dr Martinique at the time of her initial heart attack this type of pain was her marker as to when to take the NTG. She is trying to figure out if she still needs to take NTG if the pain goews away with burping. Will forward to dr Martinique to review and advise.

## 2018-12-06 ENCOUNTER — Other Ambulatory Visit: Payer: Self-pay | Admitting: Cardiology

## 2018-12-09 ENCOUNTER — Ambulatory Visit (INDEPENDENT_AMBULATORY_CARE_PROVIDER_SITE_OTHER): Payer: BC Managed Care – PPO | Admitting: Family Medicine

## 2018-12-09 ENCOUNTER — Other Ambulatory Visit: Payer: Self-pay

## 2018-12-09 ENCOUNTER — Encounter: Payer: Self-pay | Admitting: Family Medicine

## 2018-12-09 ENCOUNTER — Other Ambulatory Visit (HOSPITAL_COMMUNITY)
Admission: RE | Admit: 2018-12-09 | Discharge: 2018-12-09 | Disposition: A | Discharge status: Home / Self Care | Payer: BC Managed Care – PPO | Source: Ambulatory Visit | Attending: Family Medicine | Admitting: Family Medicine

## 2018-12-09 VITALS — BP 137/89 | HR 65 | Temp 97.3°F | Resp 18 | Ht 68.0 in | Wt 256.0 lb

## 2018-12-09 DIAGNOSIS — E782 Mixed hyperlipidemia: Secondary | ICD-10-CM

## 2018-12-09 DIAGNOSIS — Z01419 Encounter for gynecological examination (general) (routine) without abnormal findings: Secondary | ICD-10-CM

## 2018-12-09 DIAGNOSIS — Z0000 Encounter for general adult medical examination without abnormal findings: Secondary | ICD-10-CM

## 2018-12-09 DIAGNOSIS — Z23 Encounter for immunization: Secondary | ICD-10-CM | POA: Diagnosis not present

## 2018-12-09 DIAGNOSIS — I2102 ST elevation (STEMI) myocardial infarction involving left anterior descending coronary artery: Secondary | ICD-10-CM

## 2018-12-09 DIAGNOSIS — I10 Essential (primary) hypertension: Secondary | ICD-10-CM

## 2018-12-09 DIAGNOSIS — E2839 Other primary ovarian failure: Secondary | ICD-10-CM

## 2018-12-09 DIAGNOSIS — Z131 Encounter for screening for diabetes mellitus: Secondary | ICD-10-CM

## 2018-12-09 DIAGNOSIS — Z1239 Encounter for other screening for malignant neoplasm of breast: Secondary | ICD-10-CM

## 2018-12-09 LAB — HEMOGLOBIN A1C: Hgb A1c MFr Bld: 5.3 % (ref 4.6–6.5)

## 2018-12-09 MED ORDER — ZOSTER VAC RECOMB ADJUVANTED 50 MCG/0.5ML IM SUSR: 0.5000 mL | Freq: Once | INTRAMUSCULAR | 1 refills | Status: AC

## 2018-12-09 NOTE — Patient Instructions (Signed)
Health Maintenance, Female Adopting a healthy lifestyle and getting preventive care are important in promoting health and wellness. Ask your health care provider about:  The right schedule for you to have regular tests and exams.  Things you can do on your own to prevent diseases and keep yourself healthy. What should I know about diet, weight, and exercise? Eat a healthy diet   Eat a diet that includes plenty of vegetables, fruits, low-fat dairy products, and lean protein.  Do not eat a lot of foods that are high in solid fats, added sugars, or sodium. Maintain a healthy weight Body mass index (BMI) is used to identify weight problems. It estimates body fat based on height and weight. Your health care provider can help determine your BMI and help you achieve or maintain a healthy weight. Get regular exercise Get regular exercise. This is one of the most important things you can do for your health. Most adults should:  Exercise for at least 150 minutes each week. The exercise should increase your heart rate and make you sweat (moderate-intensity exercise).  Do strengthening exercises at least twice a week. This is in addition to the moderate-intensity exercise.  Spend less time sitting. Even light physical activity can be beneficial. Watch cholesterol and blood lipids Have your blood tested for lipids and cholesterol at 64 years of age, then have this test every 5 years. Have your cholesterol levels checked more often if:  Your lipid or cholesterol levels are high.  You are older than 64 years of age.  You are at high risk for heart disease. What should I know about cancer screening? Depending on your health history and family history, you may need to have cancer screening at various ages. This may include screening for:  Breast cancer.  Cervical cancer.  Colorectal cancer.  Skin cancer.  Lung cancer. What should I know about heart disease, diabetes, and high blood  pressure? Blood pressure and heart disease  High blood pressure causes heart disease and increases the risk of stroke. This is more likely to develop in people who have high blood pressure readings, are of African descent, or are overweight.  Have your blood pressure checked: ? Every 3-5 years if you are 18-39 years of age. ? Every year if you are 40 years old or older. Diabetes Have regular diabetes screenings. This checks your fasting blood sugar level. Have the screening done:  Once every three years after age 40 if you are at a normal weight and have a low risk for diabetes.  More often and at a younger age if you are overweight or have a high risk for diabetes. What should I know about preventing infection? Hepatitis B If you have a higher risk for hepatitis B, you should be screened for this virus. Talk with your health care provider to find out if you are at risk for hepatitis B infection. Hepatitis C Testing is recommended for:  Everyone born from 1945 through 1965.  Anyone with known risk factors for hepatitis C. Sexually transmitted infections (STIs)  Get screened for STIs, including gonorrhea and chlamydia, if: ? You are sexually active and are younger than 64 years of age. ? You are older than 64 years of age and your health care provider tells you that you are at risk for this type of infection. ? Your sexual activity has changed since you were last screened, and you are at increased risk for chlamydia or gonorrhea. Ask your health care provider if   you are at risk.  Ask your health care provider about whether you are at high risk for HIV. Your health care provider may recommend a prescription medicine to help prevent HIV infection. If you choose to take medicine to prevent HIV, you should first get tested for HIV. You should then be tested every 3 months for as long as you are taking the medicine. Pregnancy  If you are about to stop having your period (premenopausal) and  you may become pregnant, seek counseling before you get pregnant.  Take 400 to 800 micrograms (mcg) of folic acid every day if you become pregnant.  Ask for birth control (contraception) if you want to prevent pregnancy. Osteoporosis and menopause Osteoporosis is a disease in which the bones lose minerals and strength with aging. This can result in bone fractures. If you are 65 years old or older, or if you are at risk for osteoporosis and fractures, ask your health care provider if you should:  Be screened for bone loss.  Take a calcium or vitamin D supplement to lower your risk of fractures.  Be given hormone replacement therapy (HRT) to treat symptoms of menopause. Follow these instructions at home: Lifestyle  Do not use any products that contain nicotine or tobacco, such as cigarettes, e-cigarettes, and chewing tobacco. If you need help quitting, ask your health care provider.  Do not use street drugs.  Do not share needles.  Ask your health care provider for help if you need support or information about quitting drugs. Alcohol use  Do not drink alcohol if: ? Your health care provider tells you not to drink. ? You are pregnant, may be pregnant, or are planning to become pregnant.  If you drink alcohol: ? Limit how much you use to 0-1 drink a day. ? Limit intake if you are breastfeeding.  Be aware of how much alcohol is in your drink. In the [place] one drink equals one 12 oz bottle of beer (355 mL), one 5 oz glass of wine (148 mL), or one 1 oz glass of hard liquor (44 mL). General instructions  Schedule regular health, dental, and eye exams.  Stay current with your vaccines.  Tell your health care provider if: ? You often feel depressed. ? You have ever been abused or do not feel safe at home. Summary  Adopting a healthy lifestyle and getting preventive care are important in promoting health and wellness.  Follow your health care provider's instructions about healthy  diet, exercising, and getting tested or screened for diseases.  Follow your health care provider's instructions on monitoring your cholesterol and blood pressure. This information is not intended to replace advice given to you by your health care provider. Make sure you discuss any questions you have with your health care provider. Document Released: 10/17/2010 Document Revised: 03/27/2018 Document Reviewed: 03/27/2018 Elsevier Patient Education  2020 Elsevier Inc.  

## 2018-12-09 NOTE — Progress Notes (Addendum)
Patient ID: Alyssa Phillips, female  DOB: May 19, 1955, 63 y.o.   MRN: [id number] Patient Care Team    Relationship Specialty Notifications Start End  Natalia Leatherwood, DO PCP - General Family Medicine  12/04/17   [place], Peter M, MD PCP - Cardiology Cardiology  11/01/17   Dorena Cookey, MD (Inactive) Consulting Physician Gastroenterology  12/04/17   Ollen Gross, MD Consulting Physician Orthopedic Surgery  12/04/17     Chief Complaint  Patient presents with  . Annual Exam    Pap smear needed. Fasting. Mammogram 02/21/2019. Needs shingles RX, lost the last one. Would like TD and flu today    Subjective:  Alyssa Phillips is a 63 y.o.  Female  present for CPE with pap. All past medical history, surgical history, allergies, family history, immunizations, medications and social history were updated in the electronic medical record today. All recent labs, ED visits and hospitalizations within the last year were reviewed.  Health maintenance:  Colonoscopy: completed 06/08/2016 , by Dr. Madilyn Fireman, resutls normal. follow up 10 year. Mammogram: completed:02/2018, birads 1. Ordered today BCGSo Cervical cancer screening: last pap: 03/15/2015, results: normal, completed by: 3-5 years>> completed today Immunizations: tdap overdue, Influenza provided today (encouraged yearly), PNA series started today PSV23, shingles script provided today,  Infectious disease screening: HIV and Hep C completed DEXA: ordered to be completed with mammogram.  Assistive device: none Oxygen XBJ:YNWG Patient has a Dental home. Hospitalizations/ED visits:reviewed extensive records in EMR   h/oSTEMI s/p DES x1 LAD/HTN/HLD/morbid obesity:  Patient was admitted for anterior STEMI 11/01/2017 an dis under the care of cardiology Dr. Sw[place].  She was found to have severe single-vessel disease but 95% proximal LAD treated with DES x1. EF 45 to 50% by LV gram. Echocardiogram showed EF 40 to 45%. Medications are managed by  cardiology. She has had follow up s/p STEMI and has scheduled follow up in a few weeks. She is scheduled to start cardio-rehab in 3-4 weeks. She is performing the recommended walking daily. She is due for lipid recheck in another 2-4 weeks.  Pt reports compliance with Brilinta, ASA 81, Lipitor, metoprolol, lisinopril. . Blood pressures ranges at home WNL. Patient denies chest pain, shortness of breath or lower extremity edema. She denies bleeding.  BMP: 11/02/2017 WNL CBC: 11/02/2017 WNL Diet: has changed to a heart healthy diet.  Exercise: starting cardiology recommended activity s/p STEMI RF: HTN, HLD, HD, morbid obesity.    Cath: 11/01/17  Ost 1st Mrg lesion is 40% stenosed.  Prox LAD lesion is 95% stenosed.  Post intervention, there is a 0% residual stenosis.  A drug-eluting stent was successfully placed using a STENT RESOLUTE ONYX 4.5X12.  There is mild left ventricular systolic dysfunction.  LV end diastolic pressure is mildly elevated.  The left ventricular ejection fraction is 50-55% by visual estimate. 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 Mildly impaired LV function. EF 45-50% 3. Mildly elevated LVEDP 4. Successful PCI of the proximal LAD with DES x 1 2D echo 11/01/17 Study Conclusions - Left ventricle: The cavity size was normal. There was mild focal basal hypertrophy of the septum. Systolic function was mildly to moderately reduced. The estimated ejection fraction was in the range of 40% to 45%. Wall motion abnormalities noted below. The study is indeterminate for the evaluation of LV diastolic function. Acoustic contrast opacification revealed no evidence ofthrombus. - Regional wall motion abnormality: Akinesis of the apical anterior and apical myocardium; hypokinesis of the mid anterior, mid anteroseptal,  apical septal, and apical lateral myocardium. - Aortic valve: There was no regurgitation. - Mitral valve: Structurally normal  valve. No echocardiographic evidence for prolapse. Transvalvular velocity was within the normal range. There was no evidence for stenosis. There was no regurgitation. - Tricuspid valve: There was no significant regurgitation. - Pulmonic valve: There was no significant regurgitation. Impressions: - -LV EF mild to moderately reduced with focal wall motion abnormalities. Hypokinesis of anterior and anteroseptal walls and apex. Echo contrast used to better evaluate wall motion and exclude LV thrombus. -No evidence of mitral valve prolapse or regurgitation. -Technically difficult study. Right sided structures not well visualized but appear grossly normal.    Depression screen [facility] 2/9 12/09/2018 04/08/2018 01/07/2018 12/04/2017  Decreased Interest 0 0 0 0  Down, Depressed, Hopeless 0 0 0 0  PHQ - 2 Score 0 0 0 0   No flowsheet data found.   Immunization History  Administered Date(s) Administered  . Influenza,inj,Quad PF,6+ Mos 03/10/2015, 12/22/2016, 12/04/2017, 12/09/2018  . Influenza,inj,quad, With Preservative 12/18/2016  . Influenza-Unspecified 05/15/2016  . Pneumococcal Polysaccharide-23 12/04/2017  . Tdap 12/09/2018    Past Medical History:  Diagnosis Date  . Allergic rhinitis   . Arthritis    Osteoarthrits-knees-hx. RTKA  . Asthma    environmental agents induced asthma  . Coronary artery disease   . Family history of adverse reaction to anesthesia    daughter very sensitive to anesthesia  . GERD (gastroesophageal reflux disease)   . Heart murmur    hx. mitral valve proplapse-mostly asymptomatic-->not apprectiaed n echo  . Hepatic steatosis 03/05/2008   [place]  . Hyperlipidemia   . Hypertension   . Morbid obesity due to excess calories (HCC)   . STEMI (ST elevation myocardial infarction) (HCC) 10/2017   LAD treated with DESx1  . UTI (urinary tract infection)   . Vitamin D deficiency   . Vitamin D deficiency    Allergies  Allergen Reactions  .  Compazine [Prochlorperazine Edisylate] Other (See Comments)    Eyes rolled back, tongue curled up  . Advil [Ibuprofen] Hives  . Aspirin Other (See Comments)    HAS to be EC aspirin!!   Past Surgical History:  Procedure Laterality Date  . 5th finger surgery Right 1990   unknown injury  . CARDIAC CATHETERIZATION    . CHOLECYSTECTOMY    . COLONOSCOPY WITH PROPOFOL N/A 06/08/2016   Procedure: COLONOSCOPY WITH PROPOFOL;  Surgeon: Dorena Cookey, MD;  Location[facility] ENDOSCOPY;  Service: Endoscopy;  Laterality: N/A;  . CORONARY STENT INTERVENTION N/A 11/01/2017   Procedure: CORONARY STENT INTERVENTION;  Surgeon: Swazil[place], Peter M, MD;  Location: [facility] INVASIVE CV LAB;  Service: Cardiovascular;  Laterality: N/A;  Resolute Onyx 4.5 mm x12 mm  . CORONARY/GRAFT ACUTE MI REVASCULARIZATION N/A 11/01/2017   Procedure: Coronary/Graft Acute MI Revascularization;  Surgeon: [place], Peter M, MD;  Location: [facility] INVASIVE CV LAB;  Service: Cardiovascular;  Laterality: N/A;  . KNEE ARTHROSCOPY  05-02-12   x2 right/ x1 left  . LEFT HEART CATH AND CORONARY ANGIOGRAPHY N/A 11/01/2017   Procedure: LEFT HEART CATH AND CORONARY ANGIOGRAPHY;  Surgeon: [place]azil[place], Peter M, MD;  Location: [facility] INVASIVE CV LAB;  Service: Cardiovascular;  Laterality: N/A;  . REPLACEMENT TOTAL KNEE Right 10/2010  . TOTAL KNEE ARTHROPLASTY  05/10/2012   Procedure: TOTAL KNEE ARTHROPLASTY;  Surgeon: Loanne Drilling, MD;  Location: [facility] ORS;  Service: Orthopedics;  Laterality: Left;  . TUBAL LIGATION    . UMBILICAL HERNIA REPAIR     Family  History  Problem Relation Age of Onset  . Hypertension Mother   . Arthritis Mother   . Lymphoma Mother   . Asthma Mother   . Cancer Mother   . Hearing loss Mother   . Miscarriages / [place] Mother   . COPD Father   . Alcohol abuse Father   . Arthritis Father   . Hearing loss Father   . Hypertension Father   . Miscarriages / Stillbirths Sister   . Arthritis Maternal Grandmother   . Hearing loss Maternal  Grandmother   . Breast cancer Maternal Grandmother 88  . Arthritis Maternal Grandfather   . Diabetes Maternal Grandfather   . Hypertension Maternal Grandfather   . Stroke Maternal Grandfather   . Arthritis Paternal Grandmother   . Diabetes Paternal Grandmother   . Hearing loss Paternal Grandmother   . Hearing loss Paternal Grandfather   . 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      Medication List       Accurate as of December 09, 2018  8:57 AM. If you have any questions, ask your nurse or doctor.        STOP taking these medications   benzonatate 100 MG capsule Commonly known as: TESSALON Stopped by: Howard Pouch, DO   Brilinta 90 MG Tabs tablet Generic drug: ticagrelor Stopped by: Howard Pouch, DO     TAKE these medications   Acetaminophen 500 MG coapsule Take 500-1,000 mg by mouth every 6 (six) hours as needed (for pain or headaches).   aspirin 81 MG EC tablet Take 1 tablet (81 mg total) by mouth daily.   atorvastatin 80 MG tablet Commonly known as: LIPITOR TAKE 1 TABLET (80 MG TOTAL) BY MOUTH DAILY AT 6 PM.   Bepreve 1.5 % Soln Generic drug: Bepotastine Besilate Place 1 drop into both eyes daily as needed. For itchy eyes   candesartan 4 MG tablet Commonly known as: ATACAND TAKE 1 TABLET BY MOUTH EVERY DAY   cholecalciferol 1000 units tablet Commonly known as: VITAMIN D Take 1,000 Units by mouth 2 (two) times daily.   diclofenac sodium 1 % Gel Commonly known as: VOLTAREN Apply  2-4 g topically daily as needed (as directed to painful sites).   ipratropium 0.03 % nasal spray Commonly known as: ATROVENT Place 2 sprays into both nostrils daily.   levalbuterol 0.63 MG/3ML nebulizer solution Commonly known as: XOPENEX Take 3 mLs (0.63 mg total) by nebulization every 6 (six) hours as needed for wheezing or shortness of breath.   levalbuterol 45 MCG/ACT inhaler Commonly known as: Xopenex HFA Inhale 2 puffs into the lungs every 4 (four) hours as needed for wheezing.   levocetirizine 5 MG tablet Commonly known as: XYZAL Take 5 mg by mouth every evening.   loratadine 10 MG tablet Commonly known as: CLARITIN Take 10 mg by mouth daily.   metoprolol succinate 50 MG 24 hr tablet Commonly known as: TOPROL-XL TAKE 1  TABLET (50 MG TOTAL) BY MOUTH DAILY. TAKE WITH OR IMMEDIATELY FOLLOWING A MEAL.   mometasone 50 MCG/ACT nasal spray Commonly known as: NASONEX Place 2 sprays into the nose every morning.   montelukast 10 MG tablet Commonly known as: SINGULAIR Take 10 mg by mouth at bedtime.   multivitamin tablet Take 1 tablet by mouth daily.   nitroGLYCERIN 0.4 MG SL tablet Commonly known as: Nitrostat Place 1 tablet (0.4 mg total) under the tongue every 5 (five) minutes as needed for chest pain.   Symbicort 160-4.5 MCG/ACT inhaler Generic drug: budesonide-formoterol Symbicort 160 mcg-4.5 mcg/actuation HFA aerosol inhaler  INHALE 2 PUFFS TWICE A DAY What changed: Another medication with the same name was removed. Continue taking this medication, and follow the directions you see here. Changed by: Felix Pacinienee Perrie Ragin, DO   Zoster Vaccine Adjuvanted injection Commonly known as: SHINGRIX Inject 0.5 mLs into the muscle once for 1 dose. Repeat dose in 2-6 months. Started by: Felix Pacinienee Juluis Fitzsimmons, DO       All past medical history, surgical history, allergies, family history, immunizations andmedications were updated in the EMR today and reviewed under the history and  medication portions of their EMR.     Recent Results (from the past 2160 hour(s))  Lipid Profile     Status: None   Collection Time: 10/23/18 10:48 AM  Result Value Ref Range   Cholesterol, Total 108 100 - 199 mg/dL   Triglycerides 48 0 - 149 mg/dL   HDL 45 >16>39 mg/dL   VLDL Cholesterol Cal 10 5 - 40 mg/dL   LDL Calculated 53 0 - 99 mg/dL   Chol/HDL Ratio 2.4 0.0 - 4.4 ratio    Comment:                                   T. Chol/HDL Ratio                                             Men  Women                               1/2 Avg.Risk  3.4    3.3                                   Avg.Risk  5.0    4.4                                2X Avg.Risk  9.6    7.1                                3X Avg.Risk 23.4   11.0   Comprehensive metabolic panel     Status: Abnormal   Collection Time: 10/23/18 10:48 AM  Result Value Ref Range   Glucose 88 65 - 99 mg/dL   BUN 15 8 - 27 mg/dL   Creatinine, Ser 1.090.73 0.57 - 1.00 mg/dL   GFR calc non Af Amer 88 >59 mL/min/1.73   GFR calc Af Amer 101 >59 mL/min/1.73   BUN/Creatinine Ratio  21 12 - 28   Sodium 139 134 - 144 mmol/L   Potassium 4.4 3.5 - 5.2 mmol/L   Chloride 106 96 - 106 mmol/L   CO2 21 20 - 29 mmol/L   Calcium 10.2 8.7 - 10.3 mg/dL   Total Protein 5.7 (L) 6.0 - 8.5 g/dL   Albumin 4.0 3.8 - 4.8 g/dL   Globulin, Total 1.7 1.5 - 4.5 g/dL   Albumin/Globulin Ratio 2.4 (H) 1.2 - 2.2   Bilirubin Total 0.6 0.0 - 1.2 mg/dL   Alkaline Phosphatase 67 39 - 117 IU/L   AST 16 0 - 40 IU/L   ALT 14 0 - 32 IU/L    Dg Chest 2 View  Result Date: 05/22/2018 CLINICAL DATA:  Cough and chest pain EXAM: CHEST - 2 VIEW COMPARISON:  Sep 15, 2015 FINDINGS: There is slight scarring in the lingula. The lungs elsewhere are clear. Heart size and pulmonary vascularity are normal. No adenopathy. No bone lesions. IMPRESSION: Mild lingular scarring. No edema or consolidation. Stable cardiac silhouette. Electronically Signed   By: Bretta BangWilliam  Woodruff III M.D.   On: 05/22/2018  12:27     ROS: 14 pt review of systems performed and negative (unless mentioned in an HPI)  Objective: BP 137/89 (BP Location: Left Arm, Patient Position: Sitting, Cuff Size: Normal)   Pulse 65   Temp (!) 97.3 F (36.3 C) (Temporal)   Resp 18   Ht 5\' 8"  (1.727 m)   Wt 256 lb (116.1 kg)   SpO2 97%   BMI 38.92 kg/m  Gen: Afebrile. No acute distress. Nontoxic in appearance, well-developed, well-nourished,  Pleasant obese caucasian female.  HENT: AT. [place]. Bilateral TM visualized and normal in appearance, normal external auditory canal. MMM, no oral lesions, adequate dentition. Bilateral nares within normal limits. Throat without erythema, ulcerations or exudates. no Cough on exam, no hoarseness on exam. Eyes:Pupils Equal Round Reactive to light, Extraocular movements intact,  Conjunctiva without redness, discharge or icterus. Neck/lymp/endocrine: Supple,no lymphadenopathy, no thyromegaly CV: RRR no murmur, no edema, +2/4 P posterior tibialis pulses. no carotid bruits. No JVD. Chest: CTAB, no wheeze, rhonchi or crackles. normal Respiratory effort. good Air movement. Abd: Soft. obese. NTND. BS present. no Masses palpated. No hepatosplenomegaly. No rebound tenderness or guarding. Skin: no rashes, purpura or petechiae. Warm and well-perfused. Skin intact. Neuro/Msk:  Normal gait. PERLA. EOMi. Alert. Oriented x3.  Cranial nerves II through XII intact. Muscle strength 5/5 upper/lower extremity. DTRs equal bilaterally. Psych: Normal affect, dress and demeanor. Normal speech. Normal thought content and judgment. Breasts: breasts appear normal, symmetrical, no tenderness on exam, no suspicious masses, no skin or nipple changes or axillary nodes. GYN:  External genitalia within normal limits, normal hair distribution, no lesions. Urethral meatus normal, no lesions. Vaginal mucosa pink, moist, normal rugae, no lesions. No cystocele or rectocele. cervix without lesions, no discharge. Bimanual exam  revealed normal uterus.  No bladder/suprapubic fullness, masses or tenderness. No cervical motion tenderness. No adnexal fullness. Anus and perineum within normal limits, no lesions.  No exam data present  Assessment/plan: Alyssa Phillips is a 63 y.o. female present for CPE STEMI involving left anterior descending coronary artery (HCC)/ Mixed hyperlipidemia/Essential hypertension/Morbid obesity (HCC) - stable. Follows w/ cardiology.  Cardiology provides all cardiac meds.  TSH collected today Breast cancer screening - MM 3D SCREEN BREAST BILATERAL; Future Estrogen deficiency - DG Bone Density; Future Diabetes mellitus screening - Hemoglobin A1c Need for influenza vaccination - Flu Vaccine QUAD 36+ mos IM Need for  Tdap vaccination - Tdap vaccine greater than or equal to 7yo IM Encounter for routine gynecological examination with Papanicolaou smear of cervix/Encounter for preventive health examination - Cytology - PAP w/ HPV Patient was encouraged to exercise greater than 150 minutes a week. Patient was encouraged to choose a diet filled with fresh fruits and vegetables, and lean meats. AVS provided to patient today for education/recommendation on gender specific health and safety maintenance. Colonoscopy: completed 06/08/2016 , by Dr. Madilyn FiremanHayes, resutls normal. follow up 10 year. Mammogram: completed:02/2018, birads 1. Ordered today BCGSo Cervical cancer screening: last pap: 03/15/2015, results: normal, completed by: 3-5 years>> completed today Immunizations: tdap completed today 12/09/2018, Influenza provided today.  today (encouraged yearly), PSV23 complted, shingles script provided today again today- may make nurse visit if desired to have completed here.   Infectious disease screening: HIV and Hep C completed DEXA: ordered to be completed with mammogram.   Return in about 1 year (around 12/09/2019) for CPE (30 min).  Electronically signed by: Felix Pacinienee Azharia Surratt, DO [facility]  [email or web address]

## 2018-12-10 LAB — CYTOLOGY - PAP
Diagnosis: NEGATIVE
HPV: NOT DETECTED

## 2018-12-10 LAB — TSH: TSH: 2.95 u[IU]/mL (ref 0.35–4.50)

## 2018-12-11 ENCOUNTER — Telehealth: Payer: Self-pay | Admitting: Family Medicine

## 2018-12-11 NOTE — Telephone Encounter (Signed)
Since this problem has never been discussed or managed by this provider in the past- She would need an appt to discuss in order to be treated and managed for this issue. If she would like to have a virtual visit, instead of in person visit for convenience it can be completed that way.

## 2018-12-11 NOTE — Telephone Encounter (Signed)
Patient states she forgot to talk w/ Dr Raoul Pitch about her taking ove rher asthma medications at her last OV.  She states this was discussed previously and she would like Dr Raoul Pitch to take over prescribing xyzal, singulair, xopenex, and symbicort if possible?    Patient currently needs refills on xyzal 5mg , singulair 10mg  and symbicort 160-4.5   Please advise if this is something you will do for patient.   Patient uses [facility], [place].  She states that when she went to Ast[facility] allergy they told her she did not really have allergies, only hypersensitivity to smells.   Please advise.  Thanks!

## 2018-12-11 NOTE — Telephone Encounter (Signed)
Scheduled patient for a VV. She has enough medications last until appointment first week of September

## 2018-12-18 ENCOUNTER — Ambulatory Visit (INDEPENDENT_AMBULATORY_CARE_PROVIDER_SITE_OTHER): Payer: BC Managed Care – PPO | Admitting: Family Medicine

## 2018-12-18 ENCOUNTER — Encounter: Payer: Self-pay | Admitting: Family Medicine

## 2018-12-18 ENCOUNTER — Other Ambulatory Visit: Payer: Self-pay

## 2018-12-18 VITALS — Ht 68.0 in | Wt 251.0 lb

## 2018-12-18 DIAGNOSIS — J4540 Moderate persistent asthma, uncomplicated: Secondary | ICD-10-CM

## 2018-12-18 DIAGNOSIS — J301 Allergic rhinitis due to pollen: Secondary | ICD-10-CM | POA: Insufficient documentation

## 2018-12-18 MED ORDER — BUDESONIDE-FORMOTEROL FUMARATE 160-4.5 MCG/ACT IN AERO: 2.0000 | INHALATION_SPRAY | Freq: Two times a day (BID) | RESPIRATORY_TRACT | 1 refills | Status: DC

## 2018-12-18 MED ORDER — LEVOCETIRIZINE DIHYDROCHLORIDE 5 MG PO TABS: 5.0000 mg | ORAL_TABLET | Freq: Every evening | ORAL | 1 refills | Status: DC

## 2018-12-18 MED ORDER — MOMETASONE FUROATE 50 MCG/ACT NA SUSP: 2.0000 | Freq: Every morning | NASAL | 5 refills | Status: DC

## 2018-12-18 MED ORDER — IPRATROPIUM BROMIDE 0.03 % NA SOLN: 2.0000 | Freq: Every day | NASAL | 5 refills | Status: DC

## 2018-12-18 MED ORDER — MONTELUKAST SODIUM 10 MG PO TABS: 10.0000 mg | ORAL_TABLET | Freq: Every day | ORAL | 1 refills | Status: DC

## 2018-12-18 MED ORDER — ALBUTEROL SULFATE HFA 108 (90 BASE) MCG/ACT IN AERS: 1.0000 | INHALATION_SPRAY | Freq: Four times a day (QID) | RESPIRATORY_TRACT | 11 refills | Status: DC | PRN

## 2018-12-18 NOTE — Progress Notes (Signed)
VIRTUAL VISIT VIA VIDEO  I connected with Alyssa Phillips on 12/18/18 at  1:00 PM EDT by a video enabled telemedicine application and verified that I am speaking with the correct person using two identifiers. Location patient: Home Location provider: [facility], Office Persons participating in the virtual visit: Patient, Dr. Claiborne BillingsKuneff and R.Baker, LPN  I discussed the limitations of evaluation and management by telemedicine and the availability of in person appointments. The patient expressed understanding and agreed to proceed.   SUBJECTIVE Chief Complaint  Patient presents with  . Medication Refill    Patient would like refills on her asthma medication. No concerns.     HPI: Alyssa Phillips is a 63 y.o. female present for asthma condition.  She has been followed by asthma specialist in the past.  However she would like to start following here for her asthma condition.  She reports her allergies and asthma are stable currently.  She is prescribed Symbicort, Atrovent nasal spray, albuterol, Xyzal, Nasonex and Singulair.  She has been prescribed Xopenex in the past and in emergencies will use her Xopenex nebulizer.  She reports she rarely has to use any of the albuterol or Xopenex.  ROS: See pertinent positives and negatives per HPI.  Patient Active Problem List   Diagnosis Date Noted  . Asthma 05/31/2018  . Seasonal allergies 05/31/2018  . Pain in left foot 04/04/2018  . STEMI involving left anterior descending coronary artery (HCC) 12/04/2017  . Morbid obesity (HCC) 12/04/2017  . Hypertension 12/04/2017  . Encounter for general adult medical examination with abnormal findings 12/04/2017  . Moderate persistent asthma without complication 12/04/2017  . Hyperlipidemia   . Pain in joint of left shoulder 06/12/2017  . Osteoarthrosis involving lower leg 05/10/2012    Social History   Tobacco Use  . Smoking status: Never Smoker  . Smokeless tobacco: Never Used  Substance  Use Topics  . Alcohol use: Yes    Comment: occ. rare    Current Outpatient Medications:  .  Acetaminophen 500 MG coapsule, Take 500-1,000 mg by mouth every 6 (six) hours as needed (for pain or headaches)., Disp: , Rfl:  .  aspirin EC 81 MG EC tablet, Take 1 tablet (81 mg total) by mouth daily., Disp: , Rfl:  .  atorvastatin (LIPITOR) 80 MG tablet, TAKE 1 TABLET (80 MG TOTAL) BY MOUTH DAILY AT 6 PM., Disp: 90 tablet, Rfl: 1 .  Bepotastine Besilate (BEPREVE) 1.5 % SOLN, Place 1 drop into both eyes daily as needed. For itchy eyes, Disp: , Rfl:  .  budesonide-formoterol (SYMBICORT) 160-4.5 MCG/ACT inhaler, Symbicort 160 mcg-4.5 mcg/actuation HFA aerosol inhaler  INHALE 2 PUFFS TWICE A DAY, Disp: , Rfl:  .  candesartan (ATACAND) 4 MG tablet, TAKE 1 TABLET BY MOUTH EVERY DAY, Disp: 90 tablet, Rfl: 0 .  cholecalciferol (VITAMIN D) 1000 units tablet, Take 1,000 Units by mouth 2 (two) times daily., Disp: , Rfl:  .  diclofenac sodium (VOLTAREN) 1 % GEL, Apply 2-4 g topically daily as needed (as directed to painful sites). , Disp: , Rfl:  .  ipratropium (ATROVENT) 0.03 % nasal spray, Place 2 sprays into both nostrils daily., Disp: , Rfl:  .  levalbuterol (XOPENEX HFA) 45 MCG/ACT inhaler, Inhale 2 puffs into the lungs every 4 (four) hours as needed for wheezing., Disp: 1 Inhaler, Rfl: 0 .  levalbuterol (XOPENEX) 0.63 MG/3ML nebulizer solution, Take 3 mLs (0.63 mg total) by nebulization every 6 (six) hours as needed for wheezing  or shortness of breath., Disp: 72 mL, Rfl: 1 .  levocetirizine (XYZAL) 5 MG tablet, Take 5 mg by mouth every evening., Disp: , Rfl:  .  loratadine (CLARITIN) 10 MG tablet, Take 10 mg by mouth daily., Disp: , Rfl:  .  metoprolol succinate (TOPROL-XL) 50 MG 24 hr tablet, TAKE 1 TABLET (50 MG TOTAL) BY MOUTH DAILY. TAKE WITH OR IMMEDIATELY FOLLOWING A MEAL., Disp: 90 tablet, Rfl: 1 .  mometasone (NASONEX) 50 MCG/ACT nasal spray, Place 2 sprays into the nose every morning., Disp: , Rfl:   .  montelukast (SINGULAIR) 10 MG tablet, Take 10 mg by mouth at bedtime., Disp: , Rfl:  .  Multiple Vitamin (MULTIVITAMIN) tablet, Take 1 tablet by mouth daily., Disp: , Rfl:  .  nitroGLYCERIN (NITROSTAT) 0.4 MG SL tablet, Place 1 tablet (0.4 mg total) under the tongue every 5 (five) minutes as needed for chest pain., Disp: 25 tablet, Rfl: 2  Allergies  Allergen Reactions  . Compazine [Prochlorperazine Edisylate] Other (See Comments)    Eyes rolled back, tongue curled up  . Advil [Ibuprofen] Hives  . Aspirin Other (See Comments)    HAS to be EC aspirin!!    OBJECTIVE: Ht 5\' 8"  (1.727 m)   Wt 251 lb (113.9 kg)   BMI 38.16 kg/m  Gen: No acute distress. Nontoxic in appearance.  HENT: AT. [place].  MMM.  Eyes:Pupils Equal Round Reactive to light, Extraocular movements intact,  Conjunctiva without redness, discharge or icterus. CV: no edema Chest: Cough or shortness of breath not present Neuro: Alert. Oriented x3  Psych: Normal affect, dress and demeanor. Normal speech. Normal thought content and judgment.  ASSESSMENT AND PLAN: Alyssa Phillips is a 63 y.o. female present for  Moderate persistent asthma without complication/allergic rhinitis Stable. Refilled her medications for her today. She is established with asthma specialist  ([place]) but would like to follow up here for this condition as long as stable.  - montelukast (SINGULAIR) 10 MG tablet; Take 1 tablet (10 mg total) by mouth at bedtime.  Dispense: 90 tablet; Refill: 1 - mometasone (NASONEX) 50 MCG/ACT nasal spray; Place 2 sprays into the nose every morning.  Dispense: 17 g; Refill: 5 - levocetirizine (XYZAL) 5 MG tablet; Take 1 tablet (5 mg total) by mouth every evening.  Dispense: 90 tablet; Refill: 1 - budesonide-formoterol (SYMBICORT) 160-4.5 MCG/ACT inhaler; Inhale 2 puffs into the lungs 2 (two) times daily.  Dispense: 3 Inhaler; Refill: 1 - ipratropium (ATROVENT) 0.03 % nasal spray; Place 2 sprays into both nostrils daily.   Dispense: 30 mL; Refill: 5 - albuterol (VENTOLIN HFA) 108 (90 Base) MCG/ACT inhaler; Inhale 1-2 puffs into the lungs every 6 (six) hours as needed for wheezing or shortness of breath.  Dispense: 6 g; Refill: 11 F/u q 6 months, sooner if needed.   > 15 minutes spent with patient, > 50% of that time face to face   Howard Pouch, DO 12/18/2018

## 2018-12-24 DIAGNOSIS — M5417 Radiculopathy, lumbosacral region: Secondary | ICD-10-CM | POA: Diagnosis not present

## 2018-12-24 DIAGNOSIS — M545 Low back pain: Secondary | ICD-10-CM | POA: Diagnosis not present

## 2019-01-04 ENCOUNTER — Other Ambulatory Visit: Payer: Self-pay | Admitting: Cardiology

## 2019-01-04 DIAGNOSIS — J4540 Moderate persistent asthma, uncomplicated: Secondary | ICD-10-CM

## 2019-01-07 IMAGING — MG DIGITAL SCREENING BILATERAL MAMMOGRAM WITH TOMO AND CAD
7 series · 8 of 23 positions shown · non-contrast
Comparison: Previous exam(s).

CLINICAL DATA: Screening.

EXAM:
DIGITAL SCREENING BILATERAL MAMMOGRAM WITH TOMO AND CAD

[R MLO synth-2D]
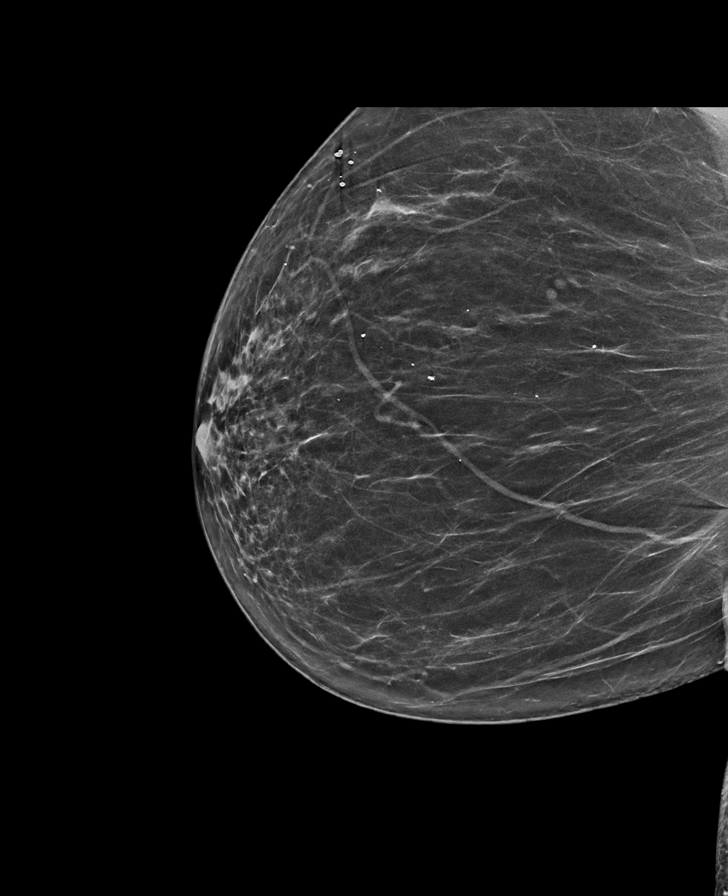

[L CC synth-2D]
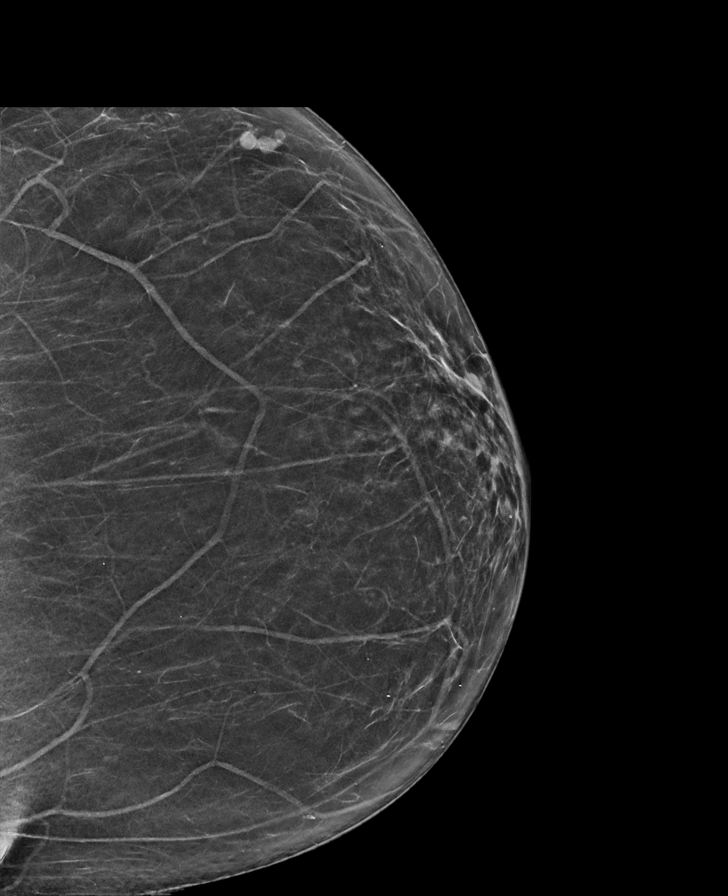

[R CC synth-2D]
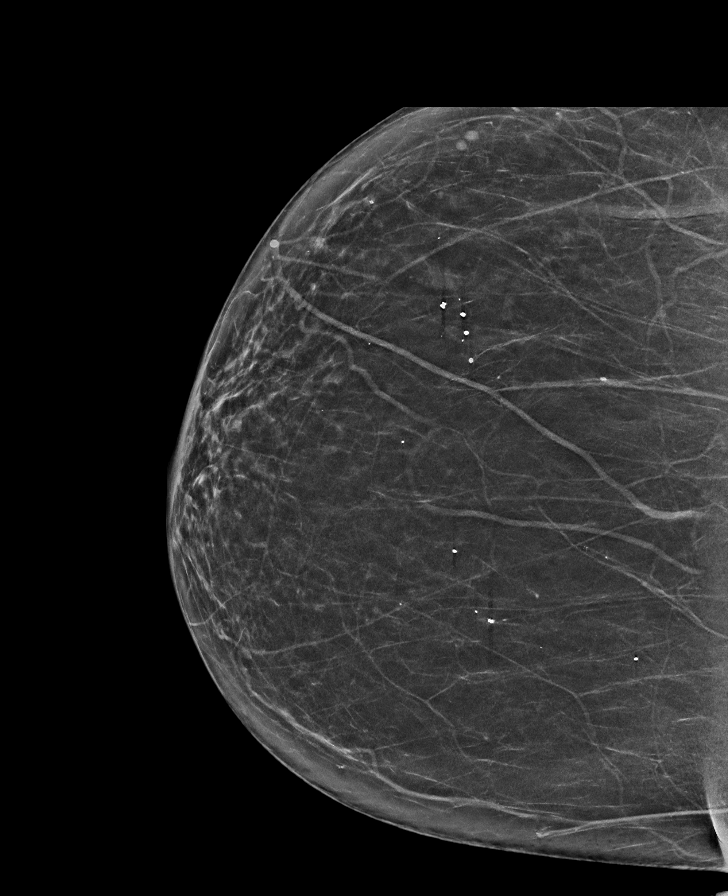

[L MLO tomo · 2 of 78 frames shown (1 of 2)]
[frame 26/78]
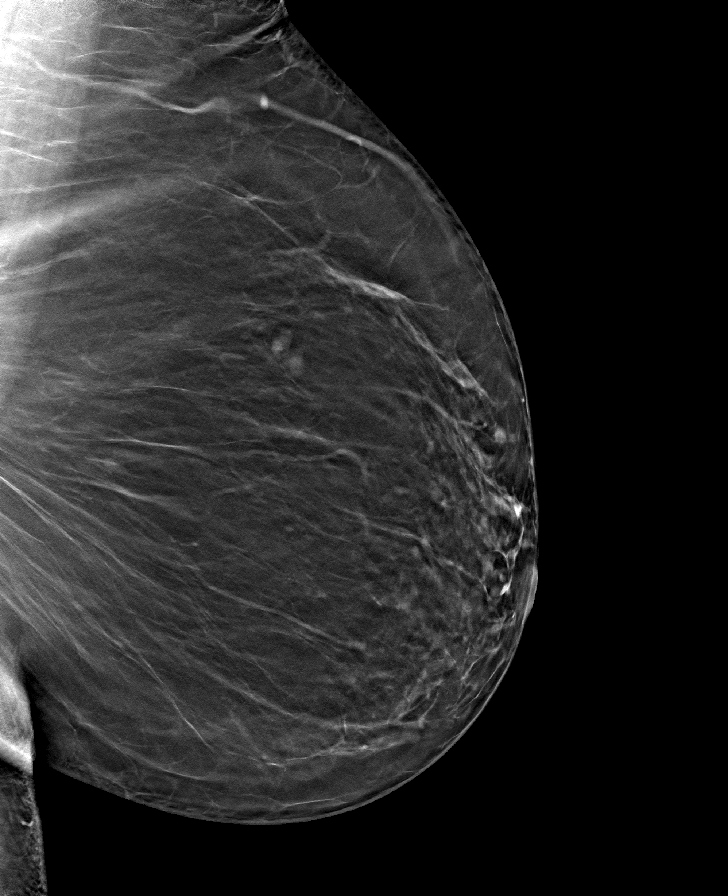
[frame 39/78]
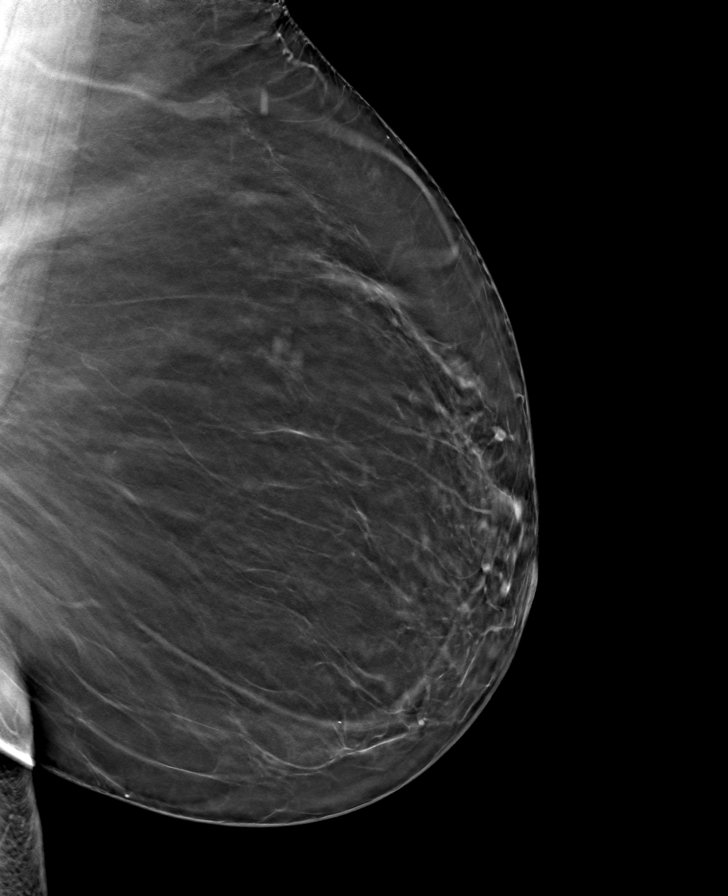

[L MLO tomo (2 of 2) · tomo slice 41/80.0]
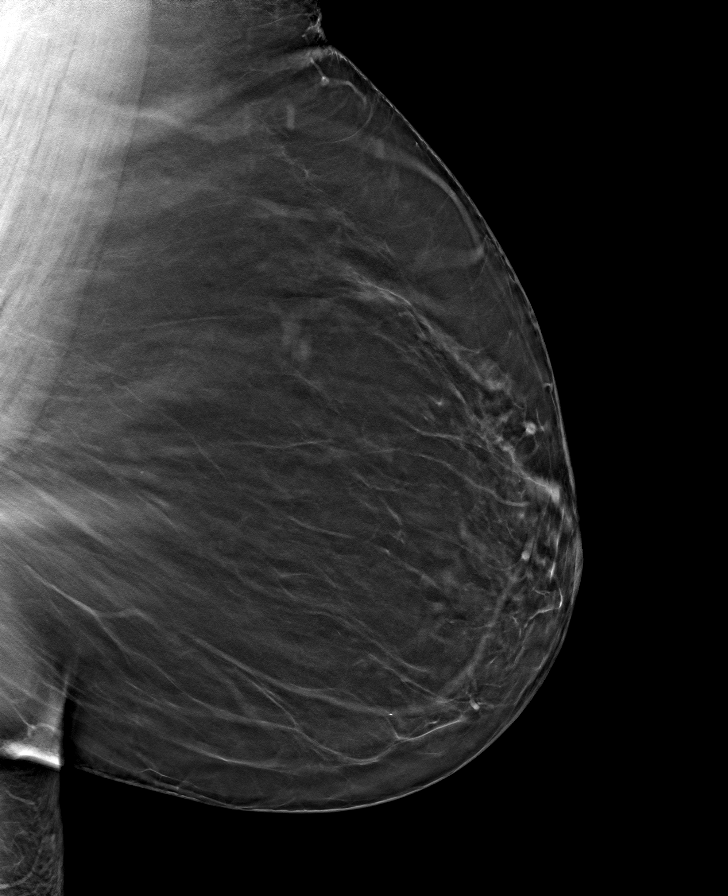

[L CC tomo · tomo slice 33/66.0]
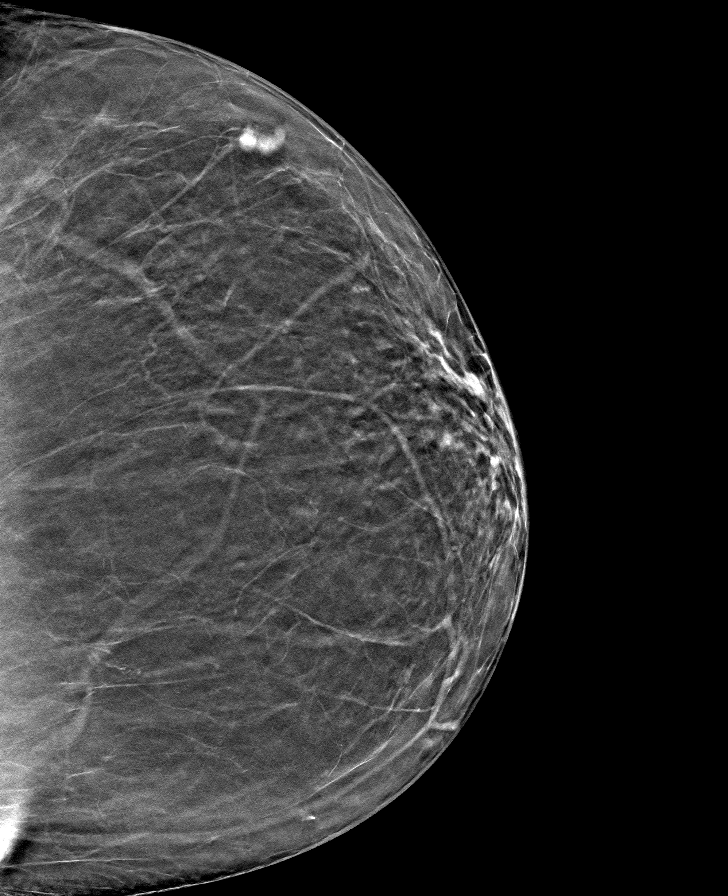

[R MLO tomo · tomo slice 37/74.0]
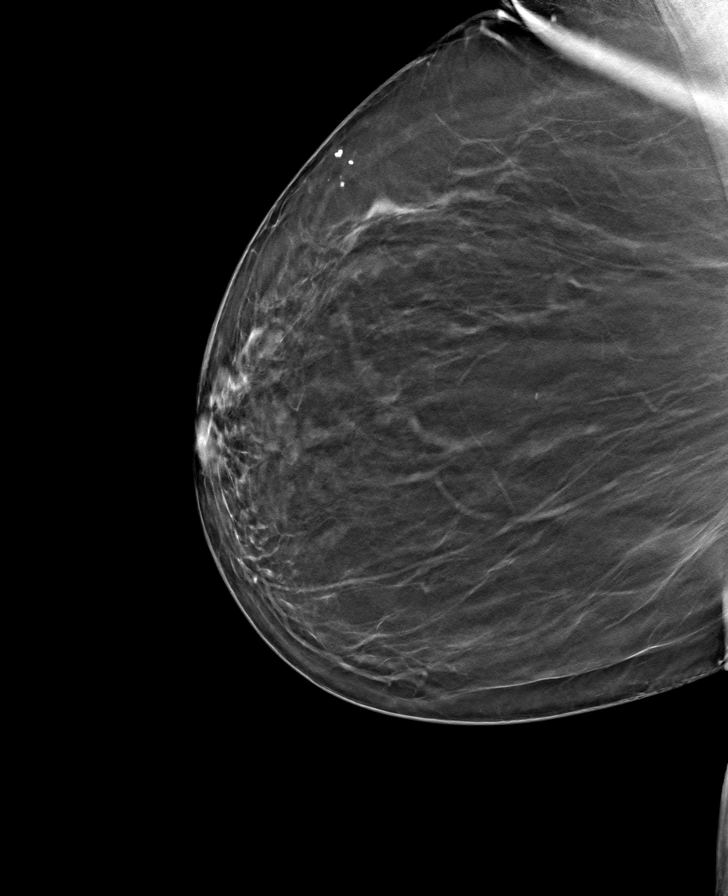

[8 of 23 positions shown; findings below may reference images not displayed]

ACR Breast Density Category b: There are scattered areas of
fibroglandular density.
FINDINGS: There are no findings suspicious for malignancy. Images were
processed with CAD.
IMPRESSION: No mammographic evidence of malignancy. A result letter of this
screening mammogram will be mailed directly to the patient.

RECOMMENDATION:
Screening mammogram in one year. (Code:[id number])

BI-RADS CATEGORY  1: Negative.

## 2019-01-09 ENCOUNTER — Other Ambulatory Visit: Payer: Self-pay | Admitting: Cardiology

## 2019-02-10 DIAGNOSIS — H1045 Other chronic allergic conjunctivitis: Secondary | ICD-10-CM | POA: Diagnosis not present

## 2019-02-10 DIAGNOSIS — J4530 Mild persistent asthma, uncomplicated: Secondary | ICD-10-CM | POA: Diagnosis not present

## 2019-02-10 DIAGNOSIS — J300 Vasomotor rhinitis: Secondary | ICD-10-CM | POA: Diagnosis not present

## 2019-02-24 ENCOUNTER — Ambulatory Visit
Admission: RE | Admit: 2019-02-24 | Discharge: 2019-02-24 | Disposition: A | Discharge status: Home / Self Care | Payer: BC Managed Care – PPO | Source: Ambulatory Visit | Attending: Family Medicine | Admitting: Family Medicine

## 2019-02-24 ENCOUNTER — Other Ambulatory Visit: Payer: Self-pay

## 2019-02-24 DIAGNOSIS — Z1239 Encounter for other screening for malignant neoplasm of breast: Secondary | ICD-10-CM

## 2019-02-24 DIAGNOSIS — M8589 Other specified disorders of bone density and structure, multiple sites: Secondary | ICD-10-CM | POA: Diagnosis not present

## 2019-02-24 DIAGNOSIS — Z780 Asymptomatic menopausal state: Secondary | ICD-10-CM | POA: Diagnosis not present

## 2019-02-24 DIAGNOSIS — Z1231 Encounter for screening mammogram for malignant neoplasm of breast: Secondary | ICD-10-CM | POA: Diagnosis not present

## 2019-02-24 DIAGNOSIS — E2839 Other primary ovarian failure: Secondary | ICD-10-CM

## 2019-03-19 DIAGNOSIS — M19072 Primary osteoarthritis, left ankle and foot: Secondary | ICD-10-CM | POA: Diagnosis not present

## 2019-03-19 DIAGNOSIS — M79672 Pain in left foot: Secondary | ICD-10-CM | POA: Diagnosis not present

## 2019-04-07 ENCOUNTER — Encounter: Payer: Self-pay | Admitting: Family Medicine

## 2019-04-07 ENCOUNTER — Other Ambulatory Visit: Payer: Self-pay

## 2019-04-07 ENCOUNTER — Ambulatory Visit (INDEPENDENT_AMBULATORY_CARE_PROVIDER_SITE_OTHER): Payer: BC Managed Care – PPO | Admitting: Family Medicine

## 2019-04-07 VITALS — BP 99/56 | HR 70 | Temp 97.5°F | Ht 68.0 in | Wt 248.1 lb

## 2019-04-07 DIAGNOSIS — Z20828 Contact with and (suspected) exposure to other viral communicable diseases: Secondary | ICD-10-CM | POA: Diagnosis not present

## 2019-04-07 DIAGNOSIS — Z20822 Contact with and (suspected) exposure to covid-19: Secondary | ICD-10-CM

## 2019-04-07 NOTE — Patient Instructions (Signed)

## 2019-04-07 NOTE — Progress Notes (Signed)
VIRTUAL VISIT VIA VIDEO  I connected with Alyssa Phillips on 04/07/19 at  2:30 PM EST by a video enabled telemedicine application and verified that I am speaking with the correct person using two identifiers. Location patient: Home Location provider: [facility], Office Persons participating in the virtual visit: Patient, Dr. Raoul Pitch and R.Baker, LPN  I discussed the limitations of evaluation and management by telemedicine and the availability of in person appointments. The patient expressed understanding and agreed to proceed.   SUBJECTIVE Chief Complaint  Patient presents with  . COVID testing    Pts mother tested positive last Thursday and she would like to get tested. 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 Pt has no symptoms.     HPI: Alyssa Phillips is a 63 y.o. female who was potentially exposed to COVID-19 through her mother.  Her mother tested on Thursday and came back positive.  Patient was around her mother on Tuesday (December 15) and Thursday (December 17).  Patient states currently she has experienced no symptoms herself.  She is concerned that she would expose her family that she is supposed to visit out of state over the holidays.  She would like to get tested for COVID-19 today secondary to exposure.  ROS: See pertinent positives and negatives per HPI.  Patient Active Problem List   Diagnosis Date Noted  . Allergic rhinitis due to pollen 12/18/2018  . Seasonal allergies 05/31/2018  . Pain in left foot 04/04/2018  . STEMI involving left anterior descending coronary artery ([place]) 12/04/2017  . Morbid obesity ([place]) 12/04/2017  . Hypertension 12/04/2017  . Encounter for general adult medical examination with abnormal findings 12/04/2017  . Moderate persistent asthma without complication 17/61/6073  . Hyperlipidemia   . Pain in joint of left shoulder 06/12/2017  . Osteoarthrosis involving lower leg 05/10/2012    Social History    Tobacco Use  . Smoking status: Never Smoker  . Smokeless tobacco: Never Used  Substance Use Topics  . Alcohol use: Yes    Comment: occ. rare    Current Outpatient Medications:  .  Acetaminophen 500 MG coapsule, Take 500-1,000 mg by mouth every 6 (six) hours as needed (for pain or headaches)., Disp: , Rfl:  .  albuterol (VENTOLIN HFA) 108 (90 Base) MCG/ACT inhaler, Inhale 1-2 puffs into the lungs every 6 (six) hours as needed for wheezing or shortness of breath., Disp: 6 g, Rfl: 11 .  aspirin EC 81 MG EC tablet, Take 1 tablet (81 mg total) by mouth daily., Disp: , Rfl:  .  atorvastatin (LIPITOR) 80 MG tablet, TAKE 1 TABLET (80 MG TOTAL) BY MOUTH DAILY AT 6 PM., Disp: 90 tablet, Rfl: 1 .  Bepotastine Besilate (BEPREVE) 1.5 % SOLN, Place 1 drop into both eyes daily as needed. For itchy eyes, Disp: , Rfl:  .  budesonide-formoterol (SYMBICORT) 160-4.5 MCG/ACT inhaler, Inhale 2 puffs into the lungs 2 (two) times daily., Disp: 3 Inhaler, Rfl: 1 .  candesartan (ATACAND) 4 MG tablet, TAKE 1 TABLET BY MOUTH EVERY DAY, Disp: 90 tablet, Rfl: 0 .  cholecalciferol (VITAMIN D) 1000 units tablet, Take 1,000 Units by mouth 2 (two) times daily., Disp: , Rfl:  .  diclofenac sodium (VOLTAREN) 1 % GEL, Apply 2-4 g topically daily as needed (as directed to painful sites). , Disp: , Rfl:  .  ipratropium (ATROVENT) 0.03 % nasal spray, Place 2 sprays into both nostrils daily., Disp: 30 mL, Rfl:  5 .  levocetirizine (XYZAL) 5 MG tablet, Take 1 tablet (5 mg total) by mouth every evening., Disp: 90 tablet, Rfl: 1 .  metoprolol succinate (TOPROL-XL) 50 MG 24 hr tablet, TAKE 1 TABLET (50 MG TOTAL) BY MOUTH DAILY. TAKE WITH OR IMMEDIATELY FOLLOWING A MEAL., Disp: 90 tablet, Rfl: 1 .  mometasone (NASONEX) 50 MCG/ACT nasal spray, Place 2 sprays into the nose every morning., Disp: 17 g, Rfl: 5 .  montelukast (SINGULAIR) 10 MG tablet, Take 1 tablet (10 mg total) by mouth at bedtime., Disp: 90 tablet, Rfl: 1 .  Multiple  Vitamin (MULTIVITAMIN) tablet, Take 1 tablet by mouth daily., Disp: , Rfl:  .  Zinc Sulfate (ZINC 15 PO), Take 1 tablet by mouth daily., Disp: , Rfl:  .  nitroGLYCERIN (NITROSTAT) 0.4 MG SL tablet, Place 1 tablet (0.4 mg total) under the tongue every 5 (five) minutes as needed for chest pain., Disp: 25 tablet, Rfl: 2  Allergies  Allergen Reactions  . Compazine [Prochlorperazine Edisylate] Other (See Comments)    Eyes rolled back, tongue curled up  . Advil [Ibuprofen] Hives  . Aspirin Other (See Comments)    HAS to be EC aspirin!!    OBJECTIVE: BP (!) 99/56   Pulse 70   Temp (!) 97.5 F (36.4 C) (Oral)   Ht 5\' 8"  (1.727 m)   Wt 248 lb 2 oz (112.5 kg)   BMI 37.73 kg/m  Gen: No acute distress. Nontoxic in appearance.  HENT: AT. [place].  MMM.  Eyes:Pupils Equal Round Reactive to light, Extraocular movements intact,  Conjunctiva without redness, discharge or icterus. Chest: Cough or shortness of breath not present Neuro:Alert. Oriented x3  Psych: Normal affect, dress and demeanor. Normal speech. Normal thought content and judgment.  ASSESSMENT AND PLAN: Alyssa Phillips is a 63 y.o. female present for  Close exposure to COVID-19 virus Patient currently is asymptomatic.  However she did have close contact without masks, with her mother which tested positive just this past Thursday. Patient was instructed on COVID-19 testing at [facility].  She was provided with 5 digit # text and get appointment. If positive would encourage her to quarantine until 29-31st of December, which would be 14 days out from exposure. Encouraged patient to self isolate until test results have returned and if positive follow the instructions above. - Novel Coronavirus, NAA ([facility]) Emergent precautions were discussed with patient. Follow-up as needed    > 15 minutes spent with patient, > 50% of that time face to face    06-30-2002, DO 04/07/2019

## 2019-04-08 ENCOUNTER — Other Ambulatory Visit: Payer: BC Managed Care – PPO

## 2019-04-08 DIAGNOSIS — Z20828 Contact with and (suspected) exposure to other viral communicable diseases: Secondary | ICD-10-CM | POA: Diagnosis not present

## 2019-06-12 ENCOUNTER — Other Ambulatory Visit: Payer: Self-pay | Admitting: Cardiology

## 2019-06-14 ENCOUNTER — Ambulatory Visit: Payer: BC Managed Care – PPO | Attending: Internal Medicine

## 2019-06-14 DIAGNOSIS — Z23 Encounter for immunization: Secondary | ICD-10-CM

## 2019-06-14 NOTE — Progress Notes (Signed)
   Covid-19 Vaccination Clinic  Name:  Alyssa Phillips    MRN: [id number] DOB: 08/11/1955  06/14/2019  Alyssa Phillips was observed post Covid-19 immunization for 30 minutes based on pre-vaccination screening without incidence. She was provided with Vaccine Information Sheet and instruction to access the V-Safe system.   Alyssa Phillips was instructed to call 911 with any severe reactions post vaccine: Marland Kitchen Difficulty breathing  . Swelling of your face and throat  . A fast heartbeat  . A bad rash all over your body  . Dizziness and weakness    Immunizations Administered    Name Date Dose VIS Date Route   Pfizer COVID-19 Vaccine 06/14/2019  9:07 AM 0.3 mL 03/28/2019 Intramuscular   Manufacturer: [employer], [employer]   Lot: [id number]   NDC: [address]-1

## 2019-06-20 NOTE — Progress Notes (Signed)
Cardiology Office Note    Date:  06/23/2019   ID:  Alyssa Phillips, Alyssa Phillips 1955/04/25, MRN [id number]  PCP:  Ma Hillock, DO  Cardiologist:  Dr. Martinique  Chief Complaint  Patient presents with  . Coronary Artery Disease    History of Present Illness:  Alyssa Phillips is a 64 y.o. female with past medical history of hypertension, HLD, asthma, reported history of mitral valve prolapse who was  admitted for anterior STEMI on 11/01/2017.  Patient was taken urgently to Cath Lab which showed severe single-vessel disease with 95% proximal LAD treated with DES x1.  EF 45 to 50% by LV gram.  Echocardiogram showed EF 40 to 45%. There was no evidence of MV prolapse.  Postprocedure, patient was placed on aspirin and Brilinta and the high-dose statin.  She developed a cough on lisinopril. She was also placed on beta-blocker and ARB.  Her brilinta was discontinued at one year.  On follow up today she feels great. She lost from 353 down to 246 lbs. No chest pain, dyspnea or edema. No palpitations. She is riding her bike up to 90 minutes a day.     Past Medical History:  Diagnosis Date  . Allergic rhinitis   . Arthritis    Osteoarthrits-knees-hx. RTKA  . Asthma    environmental agents induced asthma  . Coronary artery disease   . Family history of adverse reaction to anesthesia    daughter very sensitive to anesthesia  . GERD (gastroesophageal reflux disease)   . Heart murmur    hx. mitral valve proplapse-mostly asymptomatic-->not apprectiaed n echo  . Hepatic steatosis 03/05/2008   [place]  . Hyperlipidemia   . Hypertension   . Morbid obesity due to excess calories ([place])   . STEMI (ST elevation myocardial infarction) ([place]) 10/2017   LAD treated with DESx1  . UTI (urinary tract infection)   . Vitamin D deficiency   . Vitamin D deficiency     Past Surgical History:  Procedure Laterality Date  . 5th finger surgery Right 1990   unknown injury  . CARDIAC CATHETERIZATION    .  CHOLECYSTECTOMY    . COLONOSCOPY WITH PROPOFOL N/A 06/08/2016   Procedure: COLONOSCOPY WITH PROPOFOL;  Surgeon: Teena Irani, MD;  Location: [facility];  Service: Endoscopy;  Laterality: N/A;  . CORONARY STENT INTERVENTION N/A 11/01/2017   Procedure: CORONARY STENT INTERVENTION;  Surgeon: Martinique, Mirai Greenwood M, MD;  Location: [facility];  Service: Cardiovascular;  Laterality: N/A;  Resolute Onyx 4.5 mm x12 mm  . CORONARY/GRAFT ACUTE MI REVASCULARIZATION N/A 11/01/2017   Procedure: Coronary/Graft Acute MI Revascularization;  Surgeon: Martinique, Alta Goding M, MD;  Location: [facility];  Service: Cardiovascular;  Laterality: N/A;  . KNEE ARTHROSCOPY  05-02-12   x2 right/ x1 left  . LEFT HEART CATH AND CORONARY ANGIOGRAPHY N/A 11/01/2017   Procedure: LEFT HEART CATH AND CORONARY ANGIOGRAPHY;  Surgeon: Martinique, Tavares Levinson M, MD;  Location: [facility];  Service: Cardiovascular;  Laterality: N/A;  . REPLACEMENT TOTAL KNEE Right 10/2010  . TOTAL KNEE ARTHROPLASTY  05/10/2012   Procedure: TOTAL KNEE ARTHROPLASTY;  Surgeon: Gearlean Alf, MD;  Location: [facility];  Service: Orthopedics;  Laterality: Left;  . TUBAL LIGATION    . UMBILICAL HERNIA REPAIR      Current Medications: Outpatient Medications Prior to Visit  Medication Sig Dispense Refill  . Acetaminophen 500 MG coapsule Take 500-1,000 mg by mouth every 6 (six) hours as needed (for pain  or headaches).    Marland Kitchen albuterol (VENTOLIN HFA) 108 (90 Base) MCG/ACT inhaler Inhale 1-2 puffs into the lungs every 6 (six) hours as needed for wheezing or shortness of breath. 6 g 11  . Ascorbic Acid (VITAMIN C) 500 MG CAPS Take by mouth.    Marland Kitchen aspirin EC 81 MG EC tablet Take 1 tablet (81 mg total) by mouth daily.    Marland Kitchen atorvastatin (LIPITOR) 80 MG tablet TAKE 1 TABLET (80 MG TOTAL) BY MOUTH DAILY AT 6 PM. 90 tablet 1  . azelastine (OPTIVAR) 0.05 % ophthalmic solution azelastine 0.05 % eye drops    . Bepotastine Besilate (BEPREVE) 1.5 % SOLN Place 1 drop into both  eyes daily as needed. For itchy eyes    . budesonide-formoterol (SYMBICORT) 160-4.5 MCG/ACT inhaler Inhale 2 puffs into the lungs 2 (two) times daily. 3 Inhaler 1  . candesartan (ATACAND) 4 MG tablet TAKE 1 TABLET BY MOUTH EVERY DAY 90 tablet 1  . cholecalciferol (VITAMIN D) 1000 units tablet Take 1,000 Units by mouth 2 (two) times daily.    . diclofenac sodium (VOLTAREN) 1 % GEL Apply 2-4 g topically daily as needed (as directed to painful sites).     Marland Kitchen ipratropium (ATROVENT) 0.03 % nasal spray Place 2 sprays into both nostrils daily. 30 mL 5  . levalbuterol (XOPENEX) 1.25 MG/3ML nebulizer solution levalbuterol 1.25 mg/3 mL solution for nebulization  USE 1 VIAL VIA NEBULIZER 3 TIMES A DAY    . levocetirizine (XYZAL) 5 MG tablet Take 1 tablet (5 mg total) by mouth every evening. 90 tablet 1  . metoprolol succinate (TOPROL-XL) 50 MG 24 hr tablet TAKE 1 TABLET (50 MG TOTAL) BY MOUTH DAILY. TAKE WITH OR IMMEDIATELY FOLLOWING A MEAL. 90 tablet 1  . mometasone (NASONEX) 50 MCG/ACT nasal spray Place 2 sprays into the nose every morning. 17 g 5  . montelukast (SINGULAIR) 10 MG tablet Take 1 tablet (10 mg total) by mouth at bedtime. 90 tablet 1  . Multiple Vitamin (MULTIVITAMIN) tablet Take 1 tablet by mouth daily.    . Zinc Sulfate (ZINC 15 PO) Take 1 tablet by mouth daily.    . nitroGLYCERIN (NITROSTAT) 0.4 MG SL tablet Place 1 tablet (0.4 mg total) under the tongue every 5 (five) minutes as needed for chest pain. 25 tablet 2  . budesonide-formoterol (SYMBICORT) 160-4.5 MCG/ACT inhaler Symbicort 160 mcg-4.5 mcg/actuation HFA aerosol inhaler  INHALE 2 PUFFS BY MOUTH TWICE A DAY     No facility-administered medications prior to visit.     Allergies:   Compazine [prochlorperazine edisylate], Advil [ibuprofen], and Aspirin   Social History   Socioeconomic History  . Marital status: Married    Spouse name: Nida Boatman  . Number of children: Not on file  . Years of education: 29  . Highest education  level: Bachelor's degree (e.g., BA, AB, BS)  Occupational History  . Not on file  Tobacco Use  . Smoking status: Never Smoker  . Smokeless tobacco: Never Used  Substance and Sexual Activity  . Alcohol use: Yes    Comment: occ. rare  . Drug use: No  . Sexual activity: Yes    Partners: Male  Other Topics Concern  . 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 Difficulty of Paying Living Expenses: Not on file  Food Insecurity:   . Worried About Programme researcher, broadcasting/film/video in the Last Year: Not on file  . Ran Out of Food in the Last Year: Not on file  Transportation Needs:   . Lack of Transportation (Medical): Not on file  . Lack of Transportation (Non-Medical): Not on file  Physical Activity:   . Days of Exercise per Week: Not on file  . Minutes of Exercise per Session: Not on file  Stress:   . Feeling of Stress : Not on file  Social Connections:   . Frequency of Communication with Friends and Family: Not on file  . Frequency of Social Gatherings with Friends and Family: Not on file  . Attends Religious Services: Not on file  . Active Member of Clubs or Organizations: Not on file  . Attends Banker Meetings: Not on file  . Marital Status: Not on file     Family History:  The patient's family history includes Alcohol abuse in her father; Arthritis in her father, maternal grandfather, maternal grandmother, mother, and paternal grandmother; Asthma in her mother; Breast cancer (age of onset: 59) in her maternal grandmother; COPD in her father; Cancer in her mother; Diabetes in her maternal grandfather and paternal grandmother; Hearing loss in her father, maternal grandmother, mother, paternal grandfather, and paternal  grandmother; Hypertension in her father, maternal grandfather, and mother; Learning disabilities in her paternal grandfather; Lymphoma in her mother; Miscarriages / Stillbirths in her mother and sister; Stroke in her maternal grandfather.   ROS:   Please see the history of present illness.    ROS All other systems reviewed and are negative.   PHYSICAL EXAM:   VS:  BP 100/69   Pulse 70   Temp (!) 97.2 F (36.2 C)   Resp (!) 21   Ht 5\' 8"  (1.727 m)   Wt 246 lb 6.4 oz (111.8 kg)   SpO2 99%   BMI 37.46 kg/m    GEN: Well nourished, obesity, in no acute distress  HEENT: normal  Neck: no JVD, carotid bruits, or masses Cardiac: RRR; no murmurs, rubs, or gallops,no edema  Respiratory:  clear to auscultation bilaterally, normal work of breathing GI: soft, nontender, nondistended, + BS MS: no deformity or atrophy  Skin: warm and dry, no rash Neuro:  Alert and Oriented x 3, Strength and sensation are intact Psych: euthymic mood, full affect  Wt Readings from Last 3 Encounters:  06/23/19 246 lb 6.4 oz (111.8 kg)  04/07/19 248 lb 2 oz (112.5 kg)  12/18/18 251 lb (113.9 kg)      Studies/Labs Reviewed:   EKG:  EKG is  ordered today.  NSR with incomplete RBBB. Otherwise normal. I have personally reviewed and interpreted this study.   Recent Labs: 10/23/2018: ALT 14; BUN 15; Creatinine, Ser 0.73; Potassium 4.4; Sodium 139 12/09/2018: TSH 2.95   Lipid Panel    Component Value Date/Time   CHOL 108 10/23/2018 1048   TRIG 48 10/23/2018 1048   HDL 45 10/23/2018 1048   CHOLHDL 2.4 10/23/2018 1048   CHOLHDL 4.9 11/01/2017 0201   VLDL 25 11/01/2017 0201   LDLCALC 53 10/23/2018 1048    Additional studies/ records that were reviewed today include:   Cath: 11/01/17   Ost 1st Mrg lesion is 40% stenosed.  Prox LAD lesion is 95% stenosed.  Post intervention, there is a 0% residual  stenosis.  A drug-eluting stent was successfully placed using a STENT RESOLUTE ONYX 4.5X12.  There is  mild left ventricular systolic dysfunction.  LV end diastolic pressure is mildly elevated.  The left ventricular ejection fraction is 50-55% by visual estimate.  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 Mildly impaired LV function. EF 45-50% 3. Mildly elevated LVEDP 4. Successful PCI of the proximal LAD with DES x 1   Recommend uninterrupted dual antiplatelet therapy with Aspirin 81mg  daily and Ticagrelor 90mg  twice daily for a minimum of 12 months (ACS - Class I recommendation).  2D echo 11/01/17 Study Conclusions  - Left ventricle: The cavity size was normal. There was mild focal basal hypertrophy of the septum. Systolic function was mildly to moderately reduced. The estimated ejection fraction was in the range of 40% to 45%. Wall motion abnormalities noted below. The study is indeterminate for the evaluation of LV diastolic function. Acoustic contrast opacification revealed no evidence ofthrombus. - Regional wall motion abnormality: Akinesis of the apical anterior and apical myocardium; hypokinesis of the mid anterior, mid anteroseptal, apical septal, and apical lateral myocardium. - Aortic valve: There was no regurgitation. - Mitral valve: Structurally normal valve. No echocardiographic evidence for prolapse. Transvalvular velocity was within the normal range. There was no evidence for stenosis. There was no regurgitation. - Tricuspid valve: There was no significant regurgitation. - Pulmonic valve: There was no significant regurgitation.  Impressions:  - -LV EF mild to moderately reduced with focal wall motion abnormalities. Hypokinesis of anterior and anteroseptal walls and apex. Echo contrast used to better evaluate wall motion and exclude LV thrombus. -No evidence of mitral valve prolapse or regurgitation. -Technically difficult study. Right sided structures not well visualized but appear grossly  normal.      ASSESSMENT:    1. Coronary artery disease involving native coronary artery of native heart without angina pectoris   2. Essential hypertension   3. Mixed hyperlipidemia      PLAN:  In order of problems listed above:  1. CAD: s/p  anterior MI 11/01/17, underwent DES to LAD.  no other residual disease. Continue ASA.  Continue  high-dose statin.  On beta blocker and low dose ARB.  2. Hyperlipidemia: Continue Lipitor. Will check fasting labs today  3. Hypertension: Blood pressure well controlled.    4. Asthma symptoms resolved with weight loss and treatment of CAD.   5.   Obesity. Continue efforts at weight loss.   Follow up in one year.   Medication Adjustments/Labs and Tests Ordered: Current medicines are reviewed at length with the patient today.  Concerns regarding medicines are outlined above.  Medication changes, Labs and Tests ordered today are listed in the Patient Instructions below. There are no Patient Instructions on file for this visit.   Signed, Harnoor Reta 11/03/17, MD  06/23/2019 10:27 AM    [facility] [address] [place], [place], [place]  [place] Phone: [phone]; Fax: [phone]

## 2019-06-23 ENCOUNTER — Other Ambulatory Visit: Payer: Self-pay

## 2019-06-23 ENCOUNTER — Ambulatory Visit: Payer: BC Managed Care – PPO | Admitting: Cardiology

## 2019-06-23 ENCOUNTER — Encounter: Payer: Self-pay | Admitting: Cardiology

## 2019-06-23 VITALS — BP 100/69 | HR 70 | Temp 97.2°F | Resp 21 | Ht 68.0 in | Wt 246.4 lb

## 2019-06-23 DIAGNOSIS — I10 Essential (primary) hypertension: Secondary | ICD-10-CM

## 2019-06-23 DIAGNOSIS — E782 Mixed hyperlipidemia: Secondary | ICD-10-CM

## 2019-06-23 DIAGNOSIS — I2510 Atherosclerotic heart disease of native coronary artery without angina pectoris: Secondary | ICD-10-CM

## 2019-06-24 LAB — LIPID PANEL
Chol/HDL Ratio: 2.5 ratio (ref 0.0–4.4)
Cholesterol, Total: 113 mg/dL (ref 100–199)
HDL: 45 mg/dL (ref >39)
LDL Chol Calc (NIH): 56 mg/dL (ref 0–99)
Triglycerides: 51 mg/dL (ref 0–149)
VLDL Cholesterol Cal: 12 mg/dL (ref 5–40)

## 2019-06-24 LAB — HEPATIC FUNCTION PANEL
ALT: 16 IU/L (ref 0–32)
AST: 19 IU/L (ref 0–40)
Albumin: 4.1 g/dL (ref 3.8–4.8)
Alkaline Phosphatase: 74 IU/L (ref 39–117)
Bilirubin Total: 0.4 mg/dL (ref 0.0–1.2)
Bilirubin, Direct: 0.14 mg/dL (ref 0.00–0.40)
Total Protein: 6.1 g/dL (ref 6.0–8.5)

## 2019-06-24 LAB — BASIC METABOLIC PANEL
BUN/Creatinine Ratio: 18 (ref 12–28)
BUN: 13 mg/dL (ref 8–27)
CO2: 24 mmol/L (ref 20–29)
Calcium: 10.9 mg/dL — ABNORMAL HIGH (ref 8.7–10.3)
Chloride: 109 mmol/L — ABNORMAL HIGH (ref 96–106)
Creatinine, Ser: 0.73 mg/dL (ref 0.57–1.00)
GFR calc Af Amer: 101 mL/min/{1.73_m2} (ref >59)
GFR calc non Af Amer: 87 mL/min/{1.73_m2} (ref >59)
Glucose: 79 mg/dL (ref 65–99)
Potassium: 4.1 mmol/L (ref 3.5–5.2)
Sodium: 141 mmol/L (ref 134–144)

## 2019-06-26 ENCOUNTER — Telehealth: Payer: Self-pay

## 2019-06-26 NOTE — Telephone Encounter (Signed)
Calcium at last OV here 10/23/2018 was 10.2,  Patient saw Dr Swaziland 06/23/19 and calcium was 10.9 at that time.  Please advise if patient should continue to take calcium supplements?

## 2019-06-26 NOTE — Telephone Encounter (Signed)
Patient was seen by cardiologist, Dr. Swaziland on 06/23/19.  She is calling here because her calcium levels were high. Dr. Claiborne Billings had mentioned in her last appt here, to add some calcium to her diet. She is concerned on what to do from here.  Patient can be reached at [phone]

## 2019-06-27 NOTE — Telephone Encounter (Signed)
Patient states she is not taking calcium but she eats a lot of leafy greens and a " low fat bread that has a lot of calcium"  she is going to limit the bread and hydrate well.   Appointment scheduled w/ provider 07/17/19 at 8am.

## 2019-06-27 NOTE — Telephone Encounter (Signed)
Please inform patient the following information: If she has had a high calcium reading, then she should avoid adding an extra calcium supplement.  How much calcium was she taking?  Calcium was not listed on her med list. I would also have her follow-up here in 4 weeks to recheck her levels.  I would also encourage her to hydrate with water.  This will help flush out the extra calcium in her system if needed.

## 2019-07-05 ENCOUNTER — Other Ambulatory Visit: Payer: Self-pay | Admitting: Cardiology

## 2019-07-09 ENCOUNTER — Other Ambulatory Visit: Payer: Self-pay | Admitting: Cardiology

## 2019-07-09 ENCOUNTER — Ambulatory Visit: Payer: BC Managed Care – PPO | Attending: Internal Medicine

## 2019-07-09 DIAGNOSIS — Z23 Encounter for immunization: Secondary | ICD-10-CM

## 2019-07-09 NOTE — Progress Notes (Signed)
   Covid-19 Vaccination Clinic  Name:  Alyssa Phillips    MRN: [id number] DOB: Oct 31, 1955  07/09/2019  Alyssa Phillips was observed post Covid-19 immunization for 30 minutes based on pre-vaccination screening without incident. She was provided with Vaccine Information Sheet and instruction to access the V-Safe system.   Alyssa Phillips was instructed to call 911 with any severe reactions post vaccine: Marland Kitchen Difficulty breathing  . Swelling of face and throat  . A fast heartbeat  . A bad rash all over body  . Dizziness and weakness   Immunizations Administered    Name Date Dose VIS Date Route   Pfizer COVID-19 Vaccine 07/09/2019  9:05 AM 0.3 mL 03/28/2019 Intramuscular   Manufacturer: [employer], [employer]   Lot: [id number]   NDC: [address]-4

## 2019-07-17 ENCOUNTER — Other Ambulatory Visit: Payer: Self-pay

## 2019-07-17 ENCOUNTER — Ambulatory Visit: Payer: BC Managed Care – PPO | Admitting: Family Medicine

## 2019-07-17 ENCOUNTER — Encounter: Payer: Self-pay | Admitting: Family Medicine

## 2019-07-17 NOTE — Patient Instructions (Signed)
We will call you with lab results on Tuesday. Ideally a women > 60 should maximize calcium intact to 1200 mg a day total  by either consuming food or supplement. We will guide you when we get results.    Osteopenia  Osteopenia is a loss of thickness (density) inside of the bones. Another name for osteopenia is low bone mass. Mild osteopenia is a normal part of aging. It is not a disease, and it does not cause symptoms. However, if you have osteopenia and continue to lose bone mass, you could develop a condition that causes the bones to become thin and break more easily (osteoporosis). You may also lose some height, have back pain, and have a stooped posture. Although osteopenia is not a disease, making changes to your lifestyle and diet can help to prevent osteopenia from developing into osteoporosis. What are the causes? Osteopenia is caused by loss of calcium in the bones.  Bones are constantly changing. Old bone cells are continually being replaced with new bone cells. This process builds new bone. The mineral calcium is needed to build new bone and maintain bone density. Bone density is usually highest around age 57. After that, most people's bodies cannot replace all the bone they have lost with new bone. What increases the risk? You are more likely to develop this condition if:  You are older than age 80.  You are a woman who went through menopause early.  You have a long illness that keeps you in bed.  You do not get enough exercise.  You lack certain nutrients (malnutrition).  You have an overactive thyroid gland (hyperthyroidism).  You smoke.  You drink a lot of alcohol.  You are taking medicines that weaken the bones, such as steroids. What are the signs or symptoms? This condition does not cause any symptoms. You may have a slightly higher risk for bone breaks (fractures), so getting fractures more easily than normal may be an indication of osteopenia. How is this  diagnosed? Your health care provider can diagnose this condition with a special type of X-ray exam that measures bone density (dual-energy X-ray absorptiometry, DEXA). This test can measure bone density in your hips, spine, and wrists. Osteopenia has no symptoms, so this condition is usually diagnosed after a routine bone density screening test is done for osteoporosis. This routine screening is usually done for:  Women who are age 30 or older.  Men who are age 60 or older. If you have risk factors for osteopenia, you may have the screening test at an earlier age. How is this treated? Making dietary and lifestyle changes can lower your risk for osteoporosis. If you have severe osteopenia that is close to becoming osteoporosis, your health care provider may prescribe medicines and dietary supplements such as calcium and vitamin D. These supplements help to rebuild bone density. Follow these instructions at home:   Take over-the-counter and prescription medicines only as told by your health care provider. These include vitamins and supplements.  Eat a diet that is high in calcium and vitamin D. ? Calcium is found in dairy products, beans, salmon, and leafy green vegetables like spinach and broccoli. ? Look for foods that have vitamin D and calcium added to them (fortified foods), such as orange juice, cereal, and bread.  Do 30 or more minutes of a weight-bearing exercise every day, such as walking, jogging, or playing a sport. These types of exercises strengthen the bones.  Take precautions at home to lower your  risk of falling, such as: ? Keeping rooms well-lit and free of clutter, such as cords. ? Installing safety rails on stairs. ? Using rubber mats in the bathroom or other areas that are often wet or slippery.  Do not use any products that contain nicotine or tobacco, such as cigarettes and e-cigarettes. If you need help quitting, ask your health care provider.  Avoid alcohol or limit  alcohol intake to no more than 1 drink a day for nonpregnant women and 2 drinks a day for men. One drink equals 12 oz of beer, 5 oz of wine, or 1 oz of hard liquor.  Keep all follow-up visits as told by your health care provider. This is important. Contact a health care provider if:  You have not had a bone density screening for osteoporosis and you are: ? A woman, age 90 or older. ? A man, age 58 or older.  You are a postmenopausal woman who has not had a bone density screening for osteoporosis.  You are older than age 54 and you want to know if you should have bone density screening for osteoporosis. Summary  Osteopenia is a loss of thickness (density) inside of the bones. Another name for osteopenia is low bone mass.  Osteopenia is not a disease, but it may increase your risk for a condition that causes the bones to become thin and break more easily (osteoporosis).  You may be at risk for osteopenia if you are older than age 41 or if you are a woman who went through early menopause.  Osteopenia does not cause any symptoms, but it can be diagnosed with a bone density screening test.  Dietary and lifestyle changes are the first treatment for osteopenia. These may lower your risk for osteoporosis. This information is not intended to replace advice given to you by your health care provider. Make sure you discuss any questions you have with your health care provider. Document Revised: 03/16/2017 Document Reviewed: 01/10/2017 Elsevier Patient Education  2020 [employer].

## 2019-07-17 NOTE — Progress Notes (Signed)
This visit occurred during the SARS-CoV-2 public health emergency.  Safety protocols were in place, including screening questions prior to the visit, additional usage of staff PPE, and extensive cleaning of exam room while observing appropriate contact time as indicated for disinfecting solutions.    Alyssa Phillips , [birth year], 64 y.o., female MRN: [id number] Patient Care Team    Relationship Specialty Notifications Start End  Natalia Leatherwood, DO PCP - General Family Medicine  12/04/17   [place]il[place], Peter M, MD PCP - Cardiology Cardiology  11/01/17   Dorena Cookey, MD (Inactive) Consulting Physician Gastroenterology  12/04/17   Ollen Gross, MD Consulting Physician Orthopedic Surgery  12/04/17     Chief Complaint  Patient presents with  . Abnormal Lab     Subjective: Pt presents for an OV to follow up on abnormal high calcium levels incidentally found on her annual labs. She had a dexa this year and reports when it returned with osteopenia results she was told maximize her calcium to 1200 mg per day in her diet. She reports her multivitamin had calcium and she noticed the bread she was consuming had added calcium of 300 mg per slice. She has since stopped those supplements. She is taking Vit d 2000u daily.   Depression screen [facility] 2/9 12/09/2018 04/08/2018 01/07/2018 12/04/2017  Decreased Interest 0 0 0 0  Down, Depressed, Hopeless 0 0 0 0  PHQ - 2 Score 0 0 0 0    Allergies  Allergen Reactions  . Compazine [Prochlorperazine Edisylate] Other (See Comments)    Eyes rolled back, tongue curled up  . Advil [Ibuprofen] Hives  . Aspirin Other (See Comments)    HAS to be EC aspirin!!   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 Allergic  rhinitis   . Arthritis    Osteoarthrits-knees-hx. RTKA  . Asthma    environmental agents induced asthma  . Coronary artery disease   . Family history of adverse reaction to anesthesia    daughter very sensitive to anesthesia  . GERD (gastroesophageal reflux disease)   . Heart murmur    hx. mitral valve proplapse-mostly asymptomatic-->not apprectiaed n echo  . Hepatic steatosis 03/05/2008   [place]  . Hyperlipidemia   . Hypertension   . Morbid obesity due to excess calories (HCC)   . STEMI (ST elevation myocardial infarction) (HCC) 10/2017   LAD treated with DESx1  . UTI (urinary tract infection)   . Vitamin D deficiency   . Vitamin D deficiency    Past Surgical History:  Procedure Laterality Date  . 5th finger surgery Right 1990   unknown injury  . CARDIAC CATHETERIZATION    . CHOLECYSTECTOMY    . COLONOSCOPY WITH PROPOFOL N/A 06/08/2016   Procedure: COLONOSCOPY WITH PROPOFOL;  Surgeon: Dorena Cookey, MD;  Location: [facility] ENDOSCOPY;  Service: Endoscopy;  Laterality: N/A;  . CORONARY STENT INTERVENTION N/A 11/01/2017   Procedure: CORONARY STENT INTERVENTION;  Surgeon: Swaziland, Peter M, MD;  Location: [facility] INVASIVE CV LAB;  Service: Cardiovascular;  Laterality: N/A;  Resolute Onyx 4.5 mm x12 mm  . CORONARY/GRAFT ACUTE MI REVASCULARIZATION N/A 11/01/2017   Procedure: Coronary/Graft Acute MI Revascularization;  Surgeon: Swaziland, Peter M,  MD;  Location: [place] CV LAB;  Service: Cardiovascular;  Laterality: N/A;  . KNEE ARTHROSCOPY  05-02-12   x2 right/ x1 left  . LEFT HEART CATH AND CORONARY ANGIOGRAPHY N/A 11/01/2017   Procedure: LEFT HEART CATH AND CORONARY ANGIOGRAPHY;  Surgeon: Martinique, Peter M, MD;  Location: [place] CV LAB;  Service: Cardiovascular;  Laterality: N/A;  . REPLACEMENT TOTAL KNEE Right 10/2010  . TOTAL KNEE ARTHROPLASTY  05/10/2012   Procedure: TOTAL KNEE ARTHROPLASTY;  Surgeon: Gearlean Alf, MD;  Location: [facility] ORS;  Service: Orthopedics;  Laterality: Left;  . TUBAL  LIGATION    . UMBILICAL HERNIA REPAIR     Family History  Problem Relation Age of Onset  . Hypertension Mother   . Arthritis Mother   . Lymphoma Mother   . Asthma Mother   . Cancer Mother   . Hearing loss Mother   . Miscarriages / [place] Mother   . COPD Father   . Alcohol abuse Father   . Arthritis Father   . Hearing loss Father   . Hypertension Father   . Miscarriages / Stillbirths Sister   . Arthritis Maternal Grandmother   . Hearing loss Maternal Grandmother   . Breast cancer Maternal Grandmother 21  . Arthritis Maternal Grandfather   . Diabetes Maternal Grandfather   . Hypertension Maternal Grandfather   . Stroke Maternal Grandfather   . Arthritis Paternal Grandmother   . Diabetes Paternal Grandmother   . Hearing loss Paternal Grandmother   . Hearing loss Paternal Grandfather   . Learning disabilities Paternal Grandfather    Allergies as of 07/17/2019      Reactions   Compazine [prochlorperazine Edisylate] Other (See Comments)   Eyes rolled back, tongue curled up   Advil [ibuprofen] Hives   Aspirin Other (See Comments)   HAS to be EC aspirin!!      Medication List       Accurate as of July 17, 2019  8:11 AM. If you have any questions, ask your nurse or doctor.        STOP taking these medications   Bepreve 1.5 % Soln Generic drug: Bepotastine Besilate Stopped by: Howard Pouch, DO     TAKE these medications   Acetaminophen 500 MG coapsule Take 500-1,000 mg by mouth every 6 (six) hours as needed (for pain or headaches).   albuterol 108 (90 Base) MCG/ACT inhaler Commonly known as: VENTOLIN HFA Inhale 1-2 puffs into the lungs every 6 (six) hours as needed for wheezing or shortness of breath.   aspirin 81 MG EC tablet Take 1 tablet (81 mg total) by mouth daily.   atorvastatin 80 MG tablet Commonly known as: LIPITOR TAKE 1 TABLET (80 MG TOTAL) BY MOUTH DAILY AT 6 PM.   azelastine 0.05 % ophthalmic solution Commonly known as: OPTIVAR azelastine  0.05 % eye drops   budesonide-formoterol 160-4.5 MCG/ACT inhaler Commonly known as: SYMBICORT Inhale 2 puffs into the lungs 2 (two) times daily.   candesartan 4 MG tablet Commonly known as: ATACAND TAKE 1 TABLET BY MOUTH EVERY DAY   cholecalciferol 1000 units tablet Commonly known as: VITAMIN D Take 1,000 Units by mouth 2 (two) times daily.   diclofenac sodium 1 % Gel Commonly known as: VOLTAREN Apply 2-4 g topically daily as needed (as directed to painful sites).   ipratropium 0.03 % nasal spray Commonly known as: ATROVENT Place 2 sprays into both nostrils daily.   levalbuterol 1.25 MG/3ML nebulizer solution Commonly known as: XOPENEX levalbuterol 1.25  mg/3 mL solution for nebulization  USE 1 VIAL VIA NEBULIZER 3 TIMES A DAY   levocetirizine 5 MG tablet Commonly known as: XYZAL Take 1 tablet (5 mg total) by mouth every evening.   metoprolol succinate 50 MG 24 hr tablet Commonly known as: TOPROL-XL TAKE 1 TABLET BY MOUTH EVERY DAY WITH OR IMMEDIATELY FOLLOWING A MEAL   mometasone 50 MCG/ACT nasal spray Commonly known as: NASONEX Place 2 sprays into the nose every morning.   montelukast 10 MG tablet Commonly known as: SINGULAIR Take 1 tablet (10 mg total) by mouth at bedtime.   multivitamin tablet Take 1 tablet by mouth daily.   nitroGLYCERIN 0.4 MG SL tablet Commonly known as: Nitrostat Place 1 tablet (0.4 mg total) under the tongue every 5 (five) minutes as needed for chest pain.   Vitamin C 500 MG Caps Take by mouth.   ZINC 15 PO Take 1 tablet by mouth daily.       All past medical history, surgical history, allergies, family history, immunizations andmedications were updated in the EMR today and reviewed under the history and medication portions of their EMR.     ROS: Negative, with the exception of above mentioned in HPI   Objective:  BP 107/74 (BP Location: Right Wrist, Patient Position: Sitting, Cuff Size: Large)   Pulse 75   Temp (!) 97.3 F  (36.3 C) (Temporal)   Resp 16   Ht 5\' 8"  (1.727 m)   Wt 247 lb (112 kg)   SpO2 99%   BMI 37.56 kg/m  Body mass index is 37.56 kg/m. Gen: Afebrile. No acute distress. Nontoxic in appearance, well developed, well nourished.  HENT: AT. [place].  Eyes:Pupils Equal Round Reactive to light, Extraocular movements intact,  Conjunctiva without redness, discharge or icterus. Neck/lymp/endocrine: Supple,no lymphadenopathy CV: RRR  Chest: CTAB, no wheeze or crackles.   Neuro:  Normal gait. PERLA. EOMi. Alert. Oriented x3  Psych: Normal affect, dress and demeanor. Normal speech. Normal thought content and judgment.  No exam data present No results found. No results found for this or any previous visit (from the past 24 hour(s)).  Assessment/Plan: NASHEA CHUMNEY is a 64 y.o. female present for OV for  Hypercalcemia Elevated calcium likely from her increase in intake. Collected PTH/Ca and vit d levels today. Pt will be called with results and guided on dosing of supplements and/or further need of evaluation after results received.  - PTH, Intact and Calcium - Vitamin D (25 hydroxy)   Reviewed expectations re: course of current medical issues.  Discussed self-management of symptoms.  Outlined signs and symptoms indicating need for more acute intervention.  Patient verbalized understanding and all questions were answered.  Patient received an After-Visit Summary.    Orders Placed This Encounter  Procedures  . PTH, Intact and Calcium  . Vitamin D (25 hydroxy)   No orders of the defined types were placed in this encounter.  Referral Orders  No referral(s) requested today     Note is dictated utilizing voice recognition software. Although note has been proof read prior to signing, occasional typographical errors still can be missed. If any questions arise, please do not hesitate to call for verification.   electronically signed by:  77, DO  [facility] -  OR

## 2019-07-21 LAB — PTH, INTACT AND CALCIUM
Calcium: 10.4 mg/dL (ref 8.6–10.4)
PTH: 91 pg/mL — ABNORMAL HIGH (ref 14–64)

## 2019-07-21 LAB — VITAMIN D 25 HYDROXY (VIT D DEFICIENCY, FRACTURES): Vit D, 25-Hydroxy: 40 ng/mL (ref 30–100)

## 2019-07-21 LAB — EXTRA SPECIMEN

## 2019-07-22 ENCOUNTER — Telehealth: Payer: Self-pay | Admitting: Family Medicine

## 2019-07-22 DIAGNOSIS — E213 Hyperparathyroidism, unspecified: Secondary | ICD-10-CM | POA: Insufficient documentation

## 2019-07-22 DIAGNOSIS — M7542 Impingement syndrome of left shoulder: Secondary | ICD-10-CM | POA: Diagnosis not present

## 2019-07-22 DIAGNOSIS — M25512 Pain in left shoulder: Secondary | ICD-10-CM | POA: Diagnosis not present

## 2019-07-22 NOTE — Telephone Encounter (Signed)
Please inform patient the following information: Vitamin D is in normal range at 40.  Continue what ever dose of vitamin D she is currently taking.  Her calcium is now at the high end of normal at 10.4.  Her calcium has been mildly elevating since July 2019.  In February 2020 it reached a high end of normal at 10.4> 10/2018 : 10.2>  06/2019: 10.9 ABNL> 07/17/2019 10.4.    Her parathyroid hormone is mildly elevated as well at 91.  Normal is less than 64.  Continue to hydrate with water to attempt to keep calcium in normal range.  Continue to avoid all calcium supplementation at this time.   I have referred her to endocrinology to further evaluate her elevated parathyroid hormone.  With her elevated parathyroid hormone> that can mean one of her parathyroid glands are producing too much hormone and can cause elevated calcium. She will be receiving a call from their office to get her scheduled for further evaluation.

## 2019-07-22 NOTE — Telephone Encounter (Signed)
Pt was called and given information/instructions, she verbalized understanding.  

## 2019-07-25 DIAGNOSIS — J4530 Mild persistent asthma, uncomplicated: Secondary | ICD-10-CM | POA: Diagnosis not present

## 2019-07-25 DIAGNOSIS — H1045 Other chronic allergic conjunctivitis: Secondary | ICD-10-CM | POA: Diagnosis not present

## 2019-07-25 DIAGNOSIS — J300 Vasomotor rhinitis: Secondary | ICD-10-CM | POA: Diagnosis not present

## 2019-08-11 DIAGNOSIS — M79672 Pain in left foot: Secondary | ICD-10-CM | POA: Diagnosis not present

## 2019-08-11 DIAGNOSIS — M19072 Primary osteoarthritis, left ankle and foot: Secondary | ICD-10-CM | POA: Diagnosis not present

## 2019-08-12 DIAGNOSIS — M19072 Primary osteoarthritis, left ankle and foot: Secondary | ICD-10-CM | POA: Diagnosis not present

## 2019-09-03 ENCOUNTER — Other Ambulatory Visit: Payer: Self-pay

## 2019-09-05 ENCOUNTER — Encounter: Payer: Self-pay | Admitting: Internal Medicine

## 2019-09-05 ENCOUNTER — Ambulatory Visit: Payer: BC Managed Care – PPO | Admitting: Internal Medicine

## 2019-09-05 ENCOUNTER — Other Ambulatory Visit: Payer: Self-pay

## 2019-09-05 VITALS — BP 132/72 | HR 61 | Temp 97.7°F | Ht 68.0 in

## 2019-09-05 DIAGNOSIS — E213 Hyperparathyroidism, unspecified: Secondary | ICD-10-CM

## 2019-09-05 LAB — BASIC METABOLIC PANEL
BUN: 18 mg/dL (ref 6–23)
CO2: 29 mEq/L (ref 19–32)
Calcium: 10.3 mg/dL (ref 8.4–10.5)
Chloride: 109 mEq/L (ref 96–112)
Creatinine, Ser: 0.69 mg/dL (ref 0.40–1.20)
GFR: 85.51 mL/min (ref >60.00)
Glucose, Bld: 74 mg/dL (ref 70–99)
Potassium: 3.8 mEq/L (ref 3.5–5.1)
Sodium: 138 mEq/L (ref 135–145)

## 2019-09-05 LAB — VITAMIN D 25 HYDROXY (VIT D DEFICIENCY, FRACTURES): VITD: 45.29 ng/mL (ref 30.00–100.00)

## 2019-09-05 LAB — ALBUMIN: Albumin: 4 g/dL (ref 3.5–5.2)

## 2019-09-05 NOTE — Progress Notes (Signed)
Name: DUSTY WAGONER  MRN/ DOB: [id number], 02/03/1956    Age/ Sex: 64 y.o., female    PCP: Natalia Leatherwood, DO   Reason for Endocrinology Evaluation: Hyperparathyrdoisim     Date of Initial Endocrinology Evaluation: 09/05/2019     HPI: Ms. CALENA SALEM is a 64 y.o. female with a past medical history of CAD ( 10/2017) , HTN and Dyslipidemia. The patient presented for initial endocrinology clinic visit on 09/05/2019 for consultative assistance with her Hyperthyroidism.     Ms. Ulatowski indicates that she was first diagnosed with hypercalcemia in 06/2019 with a serum calcium of 10.9 mg/dL ( Corrected 82.50) during routine check up . She stopped calcium tablets, and calcium loaded bread which she stopped, with repeat calcium normal. Since that time, she has not experienced symptoms of constipation, polyuria, polydipsia, denies  generalized weakness, diffuse muscle pains, significant memory impairment. She stopped use of over the counter calcium (including supplements, Tums, Rolaids, or other calcium containing antacids), lithium,  Was on HCTZ prior to 2019.   She is on vitamin D supplements 2000 iu daily   She has not history of kidney stones, kidney disease, liver disease, granulomatous disease. She does not  osteoporosis , she has broken a leg in the past ( S/P slip injury) and has osteopenia based on DXA from 02/2019. Daily dietary calcium intake: 1- 2 servings . She  Has a family history of osteoporosis (mother), but denies parathyroid disease, thyroid disease.     HISTORY:  Past Medical History:  Past Medical History:  Diagnosis Date  . Allergic rhinitis   . Arthritis    Osteoarthrits-knees-hx. RTKA  . Asthma    environmental agents induced asthma  . Coronary artery disease   . Family history of adverse reaction to anesthesia    daughter very sensitive to anesthesia  . GERD (gastroesophageal reflux disease)   . Heart murmur    hx. mitral valve proplapse-mostly  asymptomatic-->not apprectiaed n echo  . Hepatic steatosis 03/05/2008   [place]  . Hyperlipidemia   . Hypertension   . Morbid obesity due to excess calories (HCC)   . STEMI (ST elevation myocardial infarction) (HCC) 10/2017   LAD treated with DESx1  . UTI (urinary tract infection)   . Vitamin D deficiency   . Vitamin D deficiency    Past Surgical History:  Past Surgical History:  Procedure Laterality Date  . 5th finger surgery Right 1990   unknown injury  . CARDIAC CATHETERIZATION    . CHOLECYSTECTOMY    . COLONOSCOPY WITH PROPOFOL N/A 06/08/2016   Procedure: COLONOSCOPY WITH PROPOFOL;  Surgeon: Dorena Cookey, MD;  Location[facility] ENDOSCOPY;  Service: Endoscopy;  Laterality: N/A;  . CORONARY STENT INTERVENTION N/A 11/01/2017   Procedure: CORONARY STENT INTERVENTION;  Surgeon: Swazil[place], Peter M, MD;  Location[facility] INVASIVE CV LAB;  Service: Cardiovascular;  Laterality: N/A;  Resolute Onyx 4.5 mm x12 mm  . CORONARY/GRAFT ACUTE MI REVASCULARIZATION N/A 11/01/2017   Procedure: Coronary/Graft Acute MI Revascularization;  Surgeon: [place]azil[place], Peter M, MD;  Location: [facility] INVASIVE CV LAB;  Service: Cardiovascular;  Laterality: N/A;  . KNEE ARTHROSCOPY  05-02-12   x2 right/ x1 left  . LEFT HEART CATH AND CORONARY ANGIOGRAPHY N/A 11/01/2017   Procedure: LEFT HEART CATH AND CORONARY ANGIOGRAPHY;  Surgeon: Swazil[place], Peter M, MD;  Location: [facility] INVASIVE CV LAB;  Service: Cardiovascular;  Laterality: N/A;  . REPLACEMENT TOTAL KNEE Right 10/2010  . TOTAL KNEE ARTHROPLASTY  05/10/2012   Procedure:  TOTAL KNEE ARTHROPLASTY;  Surgeon: Loanne Drilling, MD;  Location: [facility] ORS;  Service: Orthopedics;  Laterality: Left;  . TUBAL LIGATION    . UMBILICAL HERNIA REPAIR        Social History:  reports that she has never smoked. She has never used smokeless tobacco. She reports current alcohol use. She reports that she does not use drugs.  Family History: family history includes Alcohol abuse in her father; Arthritis in her father,  maternal grandfather, maternal grandmother, mother, and paternal grandmother; Asthma in her mother; Breast cancer (age of onset: 20) in her maternal grandmother; COPD in her father; Cancer in her mother; Diabetes in her maternal grandfather and paternal grandmother; Hearing loss in her father, maternal grandmother, mother, paternal grandfather, and paternal grandmother; Hypertension in her father, maternal grandfather, and mother; Learning disabilities in her paternal grandfather; Lymphoma in her mother; Miscarriages / Stillbirths in her mother and sister; Stroke in her maternal grandfather.   HOME MEDICATIONS: Allergies as of 09/05/2019      Reactions   Compazine [prochlorperazine Edisylate] Other (See Comments)   Eyes rolled back, tongue curled up   Advil [ibuprofen] Hives   Aspirin Other (See Comments)   HAS to be EC aspirin!!      Medication List       Accurate as of Sep 05, 2019  8:20 AM. If you have any questions, ask your nurse or doctor.        Acetaminophen 500 MG capsule Take 500-1,000 mg by mouth every 6 (six) hours as needed (for pain or headaches).   albuterol 108 (90 Base) MCG/ACT inhaler Commonly known as: VENTOLIN HFA Inhale 1-2 puffs into the lungs every 6 (six) hours as needed for wheezing or shortness of breath.   aspirin 81 MG EC tablet Take 1 tablet (81 mg total) by mouth daily.   atorvastatin 80 MG tablet Commonly known as: LIPITOR TAKE 1 TABLET (80 MG TOTAL) BY MOUTH DAILY AT 6 PM.   azelastine 0.05 % ophthalmic solution Commonly known as: OPTIVAR azelastine 0.05 % eye drops   budesonide-formoterol 160-4.5 MCG/ACT inhaler Commonly known as: SYMBICORT Inhale 2 puffs into the lungs 2 (two) times daily.   candesartan 4 MG tablet Commonly known as: ATACAND TAKE 1 TABLET BY MOUTH EVERY DAY   cholecalciferol 1000 units tablet Commonly known as: VITAMIN D Take 1,000 Units by mouth 2 (two) times daily.   diclofenac sodium 1 % Gel Commonly known as:  VOLTAREN Apply 2-4 g topically daily as needed (as directed to painful sites).   ipratropium 0.03 % nasal spray Commonly known as: ATROVENT Place 2 sprays into both nostrils daily.   levalbuterol 1.25 MG/3ML nebulizer solution Commonly known as: XOPENEX levalbuterol 1.25 mg/3 mL solution for nebulization  USE 1 VIAL VIA NEBULIZER 3 TIMES A DAY   levocetirizine 5 MG tablet Commonly known as: XYZAL Take 1 tablet (5 mg total) by mouth every evening.   metoprolol succinate 50 MG 24 hr tablet Commonly known as: TOPROL-XL TAKE 1 TABLET BY MOUTH EVERY DAY WITH OR IMMEDIATELY FOLLOWING A MEAL   mometasone 50 MCG/ACT nasal spray Commonly known as: NASONEX Place 2 sprays into the nose every morning.   montelukast 10 MG tablet Commonly known as: SINGULAIR Take 1 tablet (10 mg total) by mouth at bedtime.   multivitamin tablet Take 1 tablet by mouth daily.   nitroGLYCERIN 0.4 MG SL tablet Commonly known as: Nitrostat Place 1 tablet (0.4 mg total) under the tongue every 5 (five) minutes  as needed for chest pain.   senna 8.6 MG tablet Commonly known as: SENOKOT Take 1 tablet by mouth daily.   Vitamin C 500 MG Caps Take by mouth.   ZINC 15 PO Take 1 tablet by mouth daily.         REVIEW OF SYSTEMS: A comprehensive ROS was conducted with the patient and is negative except as per HPI   OBJECTIVE:  VS: BP 132/72 (BP Location: Left Arm, Patient Position: Sitting, Cuff Size: Normal)   Pulse 61   Temp 97.7 F (36.5 C)   Ht 5\' 8"  (1.727 m)   SpO2 98%   BMI 37.56 kg/m    Wt Readings from Last 3 Encounters:  07/17/19 247 lb (112 kg)  06/23/19 246 lb 6.4 oz (111.8 kg)  04/07/19 248 lb 2 oz (112.5 kg)     EXAM: General: Pt appears well and is in NAD  Neck: General: Supple without adenopathy. Thyroid: Thyroid size normal.  No goiter or nodules appreciated. No thyroid bruit.  Lungs: Clear with good BS bilat with no rales, rhonchi, or wheezes  Heart: Auscultation: RRR.    Abdomen: Normoactive bowel sounds, soft, nontender, without masses or organomegaly palpable  Extremities:  BL LE: No pretibial edema normal ROM and strength.  Skin: Hair: Texture and amount normal with gender appropriate distribution Skin Inspection: No rashes Skin Palpation: Skin temperature, texture, and thickness normal to palpation  Neuro: Cranial nerves: II - XII grossly intact  Motor: Normal strength throughout DTRs: 2+ and symmetric in UE without delay in relaxation phase  Mental Status: Judgment, insight: Intact Orientation: Oriented to time, place, and person Mood and affect: No depression, anxiety, or agitation     DATA REVIEWED:  Results for CHEYANE, AYON (MRN Tawnya Crook) as of 09/05/2019 17:26  Ref. Range 09/05/2019 08:48  Sodium Latest Ref Range: 135 - 145 mEq/L 138  Potassium Latest Ref Range: 3.5 - 5.1 mEq/L 3.8  Chloride Latest Ref Range: 96 - 112 mEq/L 109  CO2 Latest Ref Range: 19 - 32 mEq/L 29  Glucose Latest Ref Range: 70 - 99 mg/dL 74  BUN Latest Ref Range: 6 - 23 mg/dL 18  Creatinine Latest Ref Range: 0.40 - 1.20 mg/dL 09/07/2019  Calcium Latest Ref Range: 8.4 - 10.5 mg/dL 9.02  Albumin Latest Ref Range: 3.5 - 5.2 g/dL 4.0  GFR Latest Ref Range: >60.00 mL/min 85.51  VITD Latest Ref Range: 30.00 - 100.00 ng/mL 45.29     ASSESSMENT/PLAN/RECOMMENDATIONS:   1. HyperCalcemia:  -Patient is asymptomatic -Patient admits to being on high calcium diet, and supplements at the time of hypercalcemia. -Repeat testing today shows a normal serum calcium at 10.3 MG/DL -PTH pending -There was no concomitant PTH level with elevated serum calcium from 06/23/2019.  She did have an elevated PTH on 4 /04/2019 with a concomitant serum calcium of 10.4 mg/DL. -Vitamin D level is normal -We will consider 24-hour urine collection for calcium/creatinine ratio if her PTH levels come back elevated.   Recommendations - Stay Hydrated  - Avoid OVER THE COUNTER calcium tablets - Make  sure you consume 2-3 servings of calcium a day in your diet      Follow-up in 4 months  Signed electronically by: 06-03-1988, MD  [facility] Endocrinology  [facility] [address] [place], [place] Phone: [phone] FAX: [phone]   CC: [phone], DO [address] [place] Natalia Leatherwood [place] Phone: [phone] Fax: [phone]   Return  to Endocrinology clinic as below: Future Appointments  Date Time Provider [place]  12/11/2019  8:00 AM Kuneff, Renee A, DO [place]

## 2019-09-05 NOTE — Patient Instructions (Addendum)
-   Stay Hydrated  - Avoid OVER THE COUNTER calcium tablets - Make sure you consume 2-3 servings of calcium a day in your diet     24-Hour Urine Collection   You will be collecting your urine for a 24-hour period of time.  Your timer starts with your first urine of the morning (For example - If you first pee at 9AM, your timer will start at 9AM)  Throw away your first urine of the morning  Collect your urine every time you pee for the next 24 hours STOP your urine collection 24 hours after you started the collection (For example - You would stop at 9AM the day after you started)  

## 2019-09-08 LAB — PARATHYROID HORMONE, INTACT (NO CA): PTH: 73 pg/mL — ABNORMAL HIGH (ref 14–64)

## 2019-09-25 ENCOUNTER — Other Ambulatory Visit: Payer: BC Managed Care – PPO

## 2019-09-25 DIAGNOSIS — E213 Hyperparathyroidism, unspecified: Secondary | ICD-10-CM | POA: Diagnosis not present

## 2019-10-03 LAB — CREATININE, URINE, 24 HOUR: Creatinine, 24H Ur: 1.26 g/(24.h) (ref 0.50–2.15)

## 2019-10-03 LAB — TEST AUTHORIZATION

## 2019-10-03 LAB — CALCIUM, URINE, 24 HOUR: Calcium, 24H Urine: 84 mg/24 h

## 2019-11-05 DIAGNOSIS — J019 Acute sinusitis, unspecified: Secondary | ICD-10-CM | POA: Diagnosis not present

## 2019-11-05 DIAGNOSIS — H1045 Other chronic allergic conjunctivitis: Secondary | ICD-10-CM | POA: Diagnosis not present

## 2019-11-05 DIAGNOSIS — J300 Vasomotor rhinitis: Secondary | ICD-10-CM | POA: Diagnosis not present

## 2019-11-05 DIAGNOSIS — J4530 Mild persistent asthma, uncomplicated: Secondary | ICD-10-CM | POA: Diagnosis not present

## 2019-12-08 DIAGNOSIS — S93492A Sprain of other ligament of left ankle, initial encounter: Secondary | ICD-10-CM | POA: Diagnosis not present

## 2019-12-11 ENCOUNTER — Encounter: Payer: Self-pay | Admitting: Family Medicine

## 2019-12-11 ENCOUNTER — Other Ambulatory Visit: Payer: Self-pay

## 2019-12-11 ENCOUNTER — Ambulatory Visit (INDEPENDENT_AMBULATORY_CARE_PROVIDER_SITE_OTHER): Payer: BC Managed Care – PPO | Admitting: Family Medicine

## 2019-12-11 VITALS — BP 108/76 | HR 57 | Temp 98.6°F | Resp 16 | Ht 69.0 in | Wt 248.0 lb

## 2019-12-11 DIAGNOSIS — Z23 Encounter for immunization: Secondary | ICD-10-CM | POA: Diagnosis not present

## 2019-12-11 DIAGNOSIS — Z131 Encounter for screening for diabetes mellitus: Secondary | ICD-10-CM | POA: Diagnosis not present

## 2019-12-11 DIAGNOSIS — I10 Essential (primary) hypertension: Secondary | ICD-10-CM | POA: Diagnosis not present

## 2019-12-11 DIAGNOSIS — J4540 Moderate persistent asthma, uncomplicated: Secondary | ICD-10-CM | POA: Diagnosis not present

## 2019-12-11 DIAGNOSIS — I2102 ST elevation (STEMI) myocardial infarction involving left anterior descending coronary artery: Secondary | ICD-10-CM | POA: Diagnosis not present

## 2019-12-11 DIAGNOSIS — E213 Hyperparathyroidism, unspecified: Secondary | ICD-10-CM | POA: Diagnosis not present

## 2019-12-11 DIAGNOSIS — Z1231 Encounter for screening mammogram for malignant neoplasm of breast: Secondary | ICD-10-CM | POA: Diagnosis not present

## 2019-12-11 DIAGNOSIS — Z0001 Encounter for general adult medical examination with abnormal findings: Secondary | ICD-10-CM

## 2019-12-11 DIAGNOSIS — E782 Mixed hyperlipidemia: Secondary | ICD-10-CM

## 2019-12-11 LAB — CBC
HCT: 43.8 % (ref 36.0–46.0)
Hemoglobin: 14.4 g/dL (ref 12.0–15.0)
MCHC: 32.9 g/dL (ref 30.0–36.0)
MCV: 96.3 fl (ref 78.0–100.0)
Platelets: 207 10*3/uL (ref 150.0–400.0)
RBC: 4.55 Mil/uL (ref 3.87–5.11)
RDW: 13.1 % (ref 11.5–15.5)
WBC: 5.6 10*3/uL (ref 4.0–10.5)

## 2019-12-11 LAB — COMPREHENSIVE METABOLIC PANEL
ALT: 17 U/L (ref 0–35)
AST: 14 U/L (ref 0–37)
Albumin: 4 g/dL (ref 3.5–5.2)
Alkaline Phosphatase: 56 U/L (ref 39–117)
BUN: 21 mg/dL (ref 6–23)
CO2: 28 mEq/L (ref 19–32)
Calcium: 10.6 mg/dL — ABNORMAL HIGH (ref 8.4–10.5)
Chloride: 109 mEq/L (ref 96–112)
Creatinine, Ser: 0.66 mg/dL (ref 0.40–1.20)
GFR: 89.93 mL/min (ref >60.00)
Glucose, Bld: 83 mg/dL (ref 70–99)
Potassium: 4.4 mEq/L (ref 3.5–5.1)
Sodium: 141 mEq/L (ref 135–145)
Total Bilirubin: 0.6 mg/dL (ref 0.2–1.2)
Total Protein: 6.3 g/dL (ref 6.0–8.3)

## 2019-12-11 LAB — TSH: TSH: 2.64 u[IU]/mL (ref 0.35–4.50)

## 2019-12-11 LAB — LIPID PANEL
Cholesterol: 126 mg/dL (ref 0–200)
HDL: 55.8 mg/dL (ref >39.00)
LDL Cholesterol: 62 mg/dL (ref 0–99)
NonHDL: 70.4
Total CHOL/HDL Ratio: 2
Triglycerides: 44 mg/dL (ref 0.0–149.0)
VLDL: 8.8 mg/dL (ref 0.0–40.0)

## 2019-12-11 LAB — HEMOGLOBIN A1C: Hgb A1c MFr Bld: 5.2 % (ref 4.6–6.5)

## 2019-12-11 MED ORDER — MONTELUKAST SODIUM 10 MG PO TABS: 10.0000 mg | ORAL_TABLET | Freq: Every day | ORAL | 3 refills | Status: DC

## 2019-12-11 MED ORDER — LEVOCETIRIZINE DIHYDROCHLORIDE 5 MG PO TABS: 5.0000 mg | ORAL_TABLET | Freq: Every evening | ORAL | 3 refills | Status: DC

## 2019-12-11 NOTE — Patient Instructions (Signed)
Health Maintenance, Female Adopting a healthy lifestyle and getting preventive care are important in promoting health and wellness. Ask your health care provider about:  The right schedule for you to have regular tests and exams.  Things you can do on your own to prevent diseases and keep yourself healthy. What should I know about diet, weight, and exercise? Eat a healthy diet   Eat a diet that includes plenty of vegetables, fruits, low-fat dairy products, and lean protein.  Do not eat a lot of foods that are high in solid fats, added sugars, or sodium. Maintain a healthy weight Body mass index (BMI) is used to identify weight problems. It estimates body fat based on height and weight. Your health care provider can help determine your BMI and help you achieve or maintain a healthy weight. Get regular exercise Get regular exercise. This is one of the most important things you can do for your health. Most adults should:  Exercise for at least 150 minutes each week. The exercise should increase your heart rate and make you sweat (moderate-intensity exercise).  Do strengthening exercises at least twice a week. This is in addition to the moderate-intensity exercise.  Spend less time sitting. Even light physical activity can be beneficial. Watch cholesterol and blood lipids Have your blood tested for lipids and cholesterol at 65 years of age, then have this test every 5 years. Have your cholesterol levels checked more often if:  Your lipid or cholesterol levels are high.  You are older than 65 years of age.  You are at high risk for heart disease. What should I know about cancer screening? Depending on your health history and family history, you may need to have cancer screening at various ages. This may include screening for:  Breast cancer.  Cervical cancer.  Colorectal cancer.  Skin cancer.  Lung cancer. What should I know about heart disease, diabetes, and high blood  pressure? Blood pressure and heart disease  High blood pressure causes heart disease and increases the risk of stroke. This is more likely to develop in people who have high blood pressure readings, are of African descent, or are overweight.  Have your blood pressure checked: ? Every 3-5 years if you are 18-39 years of age. ? Every year if you are 40 years old or older. Diabetes Have regular diabetes screenings. This checks your fasting blood sugar level. Have the screening done:  Once every three years after age 40 if you are at a normal weight and have a low risk for diabetes.  More often and at a younger age if you are overweight or have a high risk for diabetes. What should I know about preventing infection? Hepatitis B If you have a higher risk for hepatitis B, you should be screened for this virus. Talk with your health care provider to find out if you are at risk for hepatitis B infection. Hepatitis C Testing is recommended for:  Everyone born from 1945 through 1965.  Anyone with known risk factors for hepatitis C. Sexually transmitted infections (STIs)  Get screened for STIs, including gonorrhea and chlamydia, if: ? You are sexually active and are younger than 65 years of age. ? You are older than 65 years of age and your health care provider tells you that you are at risk for this type of infection. ? Your sexual activity has changed since you were last screened, and you are at increased risk for chlamydia or gonorrhea. Ask your health care provider if   you are at risk.  Ask your health care provider about whether you are at high risk for HIV. Your health care provider may recommend a prescription medicine to help prevent HIV infection. If you choose to take medicine to prevent HIV, you should first get tested for HIV. You should then be tested every 3 months for as long as you are taking the medicine. Pregnancy  If you are about to stop having your period (premenopausal) and  you may become pregnant, seek counseling before you get pregnant.  Take 400 to 800 micrograms (mcg) of folic acid every day if you become pregnant.  Ask for birth control (contraception) if you want to prevent pregnancy. Osteoporosis and menopause Osteoporosis is a disease in which the bones lose minerals and strength with aging. This can result in bone fractures. If you are 65 years old or older, or if you are at risk for osteoporosis and fractures, ask your health care provider if you should:  Be screened for bone loss.  Take a calcium or vitamin D supplement to lower your risk of fractures.  Be given hormone replacement therapy (HRT) to treat symptoms of menopause. Follow these instructions at home: Lifestyle  Do not use any products that contain nicotine or tobacco, such as cigarettes, e-cigarettes, and chewing tobacco. If you need help quitting, ask your health care provider.  Do not use street drugs.  Do not share needles.  Ask your health care provider for help if you need support or information about quitting drugs. Alcohol use  Do not drink alcohol if: ? Your health care provider tells you not to drink. ? You are pregnant, may be pregnant, or are planning to become pregnant.  If you drink alcohol: ? Limit how much you use to 0-1 drink a day. ? Limit intake if you are breastfeeding.  Be aware of how much alcohol is in your drink. In the [place] one drink equals one 12 oz bottle of beer (355 mL), one 5 oz glass of wine (148 mL), or one 1 oz glass of hard liquor (44 mL). General instructions  Schedule regular health, dental, and eye exams.  Stay current with your vaccines.  Tell your health care provider if: ? You often feel depressed. ? You have ever been abused or do not feel safe at home. Summary  Adopting a healthy lifestyle and getting preventive care are important in promoting health and wellness.  Follow your health care provider's instructions about healthy  diet, exercising, and getting tested or screened for diseases.  Follow your health care provider's instructions on monitoring your cholesterol and blood pressure. This information is not intended to replace advice given to you by your health care provider. Make sure you discuss any questions you have with your health care provider. Document Revised: 03/27/2018 Document Reviewed: 03/27/2018 Elsevier Patient Education  2020 Elsevier Inc.  

## 2019-12-11 NOTE — Progress Notes (Signed)
This visit occurred during the SARS-CoV-2 public health emergency.  Safety protocols were in place, including screening questions prior to the visit, additional usage of staff PPE, and extensive cleaning of exam room while observing appropriate contact time as indicated for disinfecting solutions.    Patient ID: Alyssa Phillips, female  DOB: [birth year], 64 y.o.   MRN: [id number] Patient Care Team    Relationship Specialty Notifications Start End  Ma Hillock, DO PCP - General Family Medicine  12/04/17   Teena Irani, MD (Inactive) Consulting Physician Gastroenterology  12/04/17   Gaynelle Arabian, MD Consulting Physician Orthopedic Surgery  12/04/17   Mosetta Anis, MD Referring Physician Allergy  12/12/19   Martinique, Peter M, MD Consulting Physician Cardiology  12/12/19   Shamleffer, Melanie Crazier, MD Consulting Physician Endocrinology  12/12/19     Chief Complaint  Patient presents with  . Annual Exam    and [facility]    Subjective:  Alyssa Phillips is a 64 y.o.  Female  present for CPE. All past medical history, surgical history, allergies, family history, immunizations, medications and social history were updated in the electronic medical record today. All recent labs, ED visits and hospitalizations within the last year were reviewed.  Health maintenance: Colonoscopy: completed2/22/2018, by Dr. Amedeo Plenty, resutlsnormal. follow up10 year.  Due 2028 Mammogram: completed:11/202020, birads1.Ordered today BC-GSo> 2021 mammogram ordered today. Cervical cancer screening: >65 when next Pap will be due therefore no further Pap testing indicated.  Last pap:11/2018 Immunizations: tdapUTD 11/2018, Influenzacompleted today (encouraged yearly), PNA UTD - rpt series after 65,shingles started today,covid series completed Infectious disease screening: HIVandHep C completed DEXA:02/2019, osteopenia -1.5.  Repeat 2023. Assistive device: none Oxygen use: none Patient has a Dental  home. Hospitalizations/ED visits: reviewed  Depression screen [facility] 2/9 12/11/2019 12/09/2018 04/08/2018 01/07/2018 12/04/2017  Decreased Interest 0 0 0 0 0  Down, Depressed, Hopeless 0 0 0 0 0  PHQ - 2 Score 0 0 0 0 0  Altered sleeping 0 - - - -  Tired, decreased energy 0 - - - -  Change in appetite 0 - - - -  Feeling bad or failure about yourself  0 - - - -  Trouble concentrating 0 - - - -  Moving slowly or fidgety/restless 0 - - - -  Suicidal thoughts 0 - - - -  PHQ-9 Score 0 - - - -   GAD 7 : Generalized Anxiety Score 12/11/2019  Nervous, Anxious, on Edge 0  Control/stop worrying 0  Worry too much - different things 0  Trouble relaxing 0  Restless 0  Easily annoyed or irritable 0  Afraid - awful might happen 0  Total GAD 7 Score 0     Immunization History  Administered Date(s) Administered  . Influenza,inj,Quad PF,6+ Mos 03/10/2015, 12/22/2016, 12/04/2017, 12/09/2018, 12/11/2019  . Influenza,inj,quad, With Preservative 12/18/2016  . Influenza-Unspecified 05/15/2016  . PFIZER SARS-COV-2 Vaccination 06/14/2019, 07/09/2019  . Pneumococcal Polysaccharide-23 12/04/2017  . Tdap 12/09/2018  . Zoster Recombinat (Shingrix) 12/11/2019    Past Medical History:  Diagnosis Date  . Allergic rhinitis   . Arthritis    Osteoarthrits-knees-hx. RTKA  . Asthma    environmental agents induced asthma  . Coronary artery disease   . Family history of adverse reaction to anesthesia    daughter very sensitive to anesthesia  . GERD (gastroesophageal reflux disease)   . Heart murmur    hx. mitral valve proplapse-mostly asymptomatic-->not apprectiaed n echo  . Hepatic steatosis 03/05/2008   [place]  .  Hyperlipidemia   . Hypertension   . Morbid obesity due to excess calories ([place])   . STEMI (ST elevation myocardial infarction) ([place]) 10/2017   LAD treated with DESx1  . UTI (urinary tract infection)   . Vitamin D deficiency   . Vitamin D deficiency    Allergies  Allergen Reactions  .  Compazine [Prochlorperazine Edisylate] Other (See Comments)    Eyes rolled back, tongue curled up  . Advil [Ibuprofen] Hives  . Aspirin Other (See Comments)    HAS to be EC aspirin!!   Past Surgical History:  Procedure Laterality Date  . 5th finger surgery Right 1990   unknown injury  . CARDIAC CATHETERIZATION    . CHOLECYSTECTOMY    . COLONOSCOPY WITH PROPOFOL N/A 06/08/2016   Procedure: COLONOSCOPY WITH PROPOFOL;  Surgeon: Teena Irani, MD;  Location: [facility];  Service: Endoscopy;  Laterality: N/A;  . CORONARY STENT INTERVENTION N/A 11/01/2017   Procedure: CORONARY STENT INTERVENTION;  Surgeon: Martinique, Peter M, MD;  Location: [facility];  Service: Cardiovascular;  Laterality: N/A;  Resolute Onyx 4.5 mm x12 mm  . CORONARY/GRAFT ACUTE MI REVASCULARIZATION N/A 11/01/2017   Procedure: Coronary/Graft Acute MI Revascularization;  Surgeon: Martinique, Peter M, MD;  Location: [facility];  Service: Cardiovascular;  Laterality: N/A;  . KNEE ARTHROSCOPY  05-02-12   x2 right/ x1 left  . LEFT HEART CATH AND CORONARY ANGIOGRAPHY N/A 11/01/2017   Procedure: LEFT HEART CATH AND CORONARY ANGIOGRAPHY;  Surgeon: Martinique, Peter M, MD;  Location: [facility];  Service: Cardiovascular;  Laterality: N/A;  . REPLACEMENT TOTAL KNEE Right 10/2010  . TOTAL KNEE ARTHROPLASTY  05/10/2012   Procedure: TOTAL KNEE ARTHROPLASTY;  Surgeon: Gearlean Alf, MD;  Location: [facility];  Service: Orthopedics;  Laterality: Left;  . TUBAL LIGATION    . UMBILICAL HERNIA REPAIR     Family History  Problem Relation Age of Onset  . Hypertension Mother   . Arthritis Mother   . Lymphoma Mother   . Asthma Mother   . Cancer Mother   . Hearing loss Mother   . Miscarriages / [place] Mother   . COPD Father   . Alcohol abuse Father   . Arthritis Father   . Hearing loss Father   . Hypertension Father   . Miscarriages / Stillbirths Sister   . Arthritis Maternal Grandmother   . Hearing loss Maternal  Grandmother   . Breast cancer Maternal Grandmother 64  . Arthritis Maternal Grandfather   . Diabetes Maternal Grandfather   . Hypertension Maternal Grandfather   . Stroke Maternal Grandfather   . Arthritis Paternal Grandmother   . Diabetes Paternal Grandmother   . Hearing loss Paternal Grandmother   . Hearing loss Paternal Grandfather   . 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      Medication List       Accurate as of December 11, 2019 11:59  PM. If you have any questions, ask your nurse or doctor.        STOP taking these medications   Acetaminophen 500 MG capsule Stopped by: Howard Pouch, DO   senna 8.6 MG tablet Commonly known as: SENOKOT Stopped by: Howard Pouch, DO   Vitamin C 500 MG Caps Stopped by: Howard Pouch, DO   ZINC 15 PO Stopped by: Howard Pouch, DO     TAKE these medications   albuterol 108 (90 Base) MCG/ACT inhaler Commonly known as: VENTOLIN HFA Inhale 1-2 puffs into the lungs every 6 (six) hours as needed for wheezing or shortness of breath.   aspirin 81 MG EC tablet Take 1 tablet (81 mg total) by mouth daily.   atorvastatin 80 MG tablet Commonly known as: LIPITOR TAKE 1 TABLET (80 MG TOTAL) BY MOUTH DAILY AT 6 PM.   azelastine 0.05 % ophthalmic solution Commonly known as: OPTIVAR azelastine 0.05 % eye drops   budesonide-formoterol 160-4.5 MCG/ACT inhaler Commonly known as: SYMBICORT Inhale 2 puffs into the lungs 2 (two) times daily.   candesartan 4 MG  tablet Commonly known as: ATACAND TAKE 1 TABLET BY MOUTH EVERY DAY   cholecalciferol 1000 units tablet Commonly known as: VITAMIN D Take 1,000 Units by mouth 2 (two) times daily.   diclofenac sodium 1 % Gel Commonly known as: VOLTAREN Apply 2-4 g topically daily as needed (as directed to painful sites).   ipratropium 0.03 % nasal spray Commonly known as: ATROVENT Place 2 sprays into both nostrils daily.   levalbuterol 1.25 MG/3ML nebulizer solution Commonly known as: XOPENEX levalbuterol 1.25 mg/3 mL solution for nebulization  USE 1 VIAL VIA NEBULIZER 3 TIMES A DAY   levocetirizine 5 MG tablet Commonly known as: XYZAL Take 1 tablet (5 mg total) by mouth every evening.   metoprolol succinate 50 MG 24 hr tablet Commonly known as: TOPROL-XL TAKE 1 TABLET BY MOUTH EVERY DAY WITH OR IMMEDIATELY FOLLOWING A MEAL   mometasone 50 MCG/ACT nasal spray Commonly known as: NASONEX Place 2 sprays into the nose every morning.   montelukast 10 MG tablet Commonly known as: SINGULAIR Take 1 tablet (10 mg total) by mouth at bedtime.   multivitamin tablet Take 1 tablet by mouth daily.   nitroGLYCERIN 0.4 MG SL tablet Commonly known as: Nitrostat Place 1 tablet (0.4 mg total) under the tongue every 5 (five) minutes as needed for chest pain.       All past medical history, surgical history, allergies, family history, immunizations andmedications were updated in the EMR today and reviewed under the history and medication portions of their EMR.      ROS: 14 pt review of systems performed and negative (unless mentioned in an HPI)  Objective: BP 108/76 (BP Location: Left Arm, Patient Position: Sitting, Cuff Size: Large)   Pulse (!) 57   Temp 98.6 F (37 C) (Oral)   Resp 16   Ht '5\' 9"'  (1.753 m)   Wt 248 lb (112.5 kg)   SpO2 95%   BMI 36.62 kg/m  Gen: Afebrile. No acute distress. Nontoxic in appearance, well-developed, well-nourished, pleasant obese female. HENT: AT. [place]. Bilateral  TM visualized and normal in appearance, normal external auditory canal. MMM, no oral lesions, adequate dentition. Bilateral nares within normal limits. Throat without erythema, ulcerations or exudates.  No cough on exam, no hoarseness on exam. Eyes:Pupils Equal Round Reactive to light, Extraocular movements intact,  Conjunctiva without redness, discharge or icterus. Neck/lymp/endocrine: Supple, no lymphadenopathy, no thyromegaly CV: RRR no  murmur, no edema, +2/4 P posterior tibialis pulses. No JVD. Chest: CTAB, no wheeze, rhonchi or crackles.  Normal respiratory effort.  Good air movement. Abd: Soft.  Obese. NTND. BS present.  No masses palpated. No hepatosplenomegaly. No rebound tenderness or guarding. Skin: No rashes, purpura or petechiae. Warm and well-perfused. Skin intact. Neuro/Msk:  Normal gait. PERLA. EOMi. Alert. Oriented x3.  Cranial nerves II through XII intact. Muscle strength 5/5 upper/lower extremity. DTRs equal bilaterally. Psych: Normal affect, dress and demeanor. Normal speech. Normal thought content and judgment.  No exam data present  Assessment/plan: Alyssa Phillips is a 64 y.o. female present for CPE  Hypertension/mixed hyperlipidemia/STEMI involving left anterior descending coronary artery (HCC)/morbid obesity Routine diet and exercise. Continue follow-ups with cardiology who manages all medications for cardiac condition-metoprolol, candesartan, Lipitor, baby aspirin, nitro - CBC - Comp Met (CMET) - TSH - Lipid panel  Moderate persistent asthma without complication Stable. Continue Xyzal Continue Singulair Inhalers prescribed by allergist Continue allergy follow-ups  Hyperparathyroidism (HCC)/hypercalcemia - PTH, Intact and Calcium -Continue routine follow-ups per endocrinology Diabetes mellitus screening - Hemoglobin A1c Encounter for screening mammogram for malignant neoplasm of breast - MM 3D SCREEN BREAST BILATERAL; Future Need for shingles vaccine -  Varicella-zoster vaccine IM (Shingrix) Needs flu shot - Flu Vaccine QUAD 6+ mos PF IM (Fluarix Quad PF)  Encounter for general adult medical examination with abnormal findings Patient was encouraged to exercise greater than 150 minutes a week. Patient was encouraged to choose a diet filled with fresh fruits and vegetables, and lean meats. AVS provided to patient today for education/recommendation on gender specific health and safety maintenance. Colonoscopy:  Due 2028 Mammogram: completed: Due 02/2020-ordered today Cervical cancer screening: No longer indicated Immunizations: Influenza and Shingrix No. 1 started today.  Increase #2 by nurse appointment in 2-6 months. Infectious disease screening: HIVandHep C completed DEXA:02/2019, osteopenia -1.5.  Repeat 2023.  Return in about 1 year (around 12/10/2020) for CPE (30 min).   Orders Placed This Encounter  Procedures  . MM 3D SCREEN BREAST BILATERAL  . Varicella-zoster vaccine IM (Shingrix)  . Flu Vaccine QUAD 6+ mos PF IM (Fluarix Quad PF)  . CBC  . Comp Met (CMET)  . PTH, Intact and Calcium  . TSH  . Hemoglobin A1c  . Lipid panel   Meds ordered this encounter  Medications  . levocetirizine (XYZAL) 5 MG tablet    Sig: Take 1 tablet (5 mg total) by mouth every evening.    Dispense:  90 tablet    Refill:  3    Hold until pt request  . montelukast (SINGULAIR) 10 MG tablet    Sig: Take 1 tablet (10 mg total) by mouth at bedtime.    Dispense:  90 tablet    Refill:  3    Hold until pt request   Referral Orders  No referral(s) requested today     Electronically signed by: Howard Pouch, Joyce

## 2019-12-12 ENCOUNTER — Encounter: Payer: Self-pay | Admitting: Family Medicine

## 2019-12-12 ENCOUNTER — Telehealth: Payer: Self-pay | Admitting: Family Medicine

## 2019-12-12 NOTE — Telephone Encounter (Signed)
I received a call from access nursing/team health about a critical lab value. [facility] for a calcium value less than 5 with reference range 8.6-10.4 reported. PTH still pending. I called [facility] directly and verified the low value.   Patient on same date had CMP with [facility] with calcium of 10.6 (high). I believe with known hyperparathyroidism that the calcium at [facility] is a lab error- I am forwarding to PCP to consider repeat next week.

## 2019-12-15 ENCOUNTER — Telehealth: Payer: Self-pay

## 2019-12-15 LAB — PTH, INTACT AND CALCIUM
Calcium: 11.1 mg/dL — ABNORMAL HIGH (ref 8.6–10.4)
PTH: 76 pg/mL — ABNORMAL HIGH (ref 14–64)

## 2019-12-15 NOTE — Telephone Encounter (Signed)
Received call from [facility] with update regarding repeat calcium for PTH, Intact and calcium originally drawn on 12/11/19. Repeat level was 11.1

## 2019-12-15 NOTE — Telephone Encounter (Signed)
error 

## 2019-12-15 NOTE — Telephone Encounter (Signed)
Thanks so much for the follow up

## 2019-12-15 NOTE — Telephone Encounter (Signed)
[facility] have been contacted.  They ran the specimen again today 12/15/2019 and calcium is reported as 11.  They are making notation in the chart and attempting to correct the abnormal lab value of 5.0.

## 2019-12-16 ENCOUNTER — Other Ambulatory Visit: Payer: Self-pay | Admitting: Cardiology

## 2019-12-18 DIAGNOSIS — M25572 Pain in left ankle and joints of left foot: Secondary | ICD-10-CM

## 2019-12-18 HISTORY — DX: Pain in left ankle and joints of left foot: M25.572

## 2019-12-25 ENCOUNTER — Telehealth (INDEPENDENT_AMBULATORY_CARE_PROVIDER_SITE_OTHER): Payer: BC Managed Care – PPO | Admitting: Family Medicine

## 2019-12-25 ENCOUNTER — Encounter: Payer: Self-pay | Admitting: Family Medicine

## 2019-12-25 VITALS — Wt 245.0 lb

## 2019-12-25 DIAGNOSIS — H5789 Other specified disorders of eye and adnexa: Secondary | ICD-10-CM | POA: Diagnosis not present

## 2019-12-25 MED ORDER — ERYTHROMYCIN 5 MG/GM OP OINT: 1.0000 "application " | TOPICAL_OINTMENT | Freq: Four times a day (QID) | OPHTHALMIC | 0 refills | Status: DC

## 2019-12-25 NOTE — Progress Notes (Signed)
Virtual Visit via Video Note  I connected with Alyssa Phillips  on 12/25/19 at 12:20 PM EDT by a video enabled telemedicine application and verified that I am speaking with the correct person using two identifiers.  Location patient: home, [place] Location provider:work or home office Persons participating in the virtual visit: patient, provider  I discussed the limitations of evaluation and management by telemedicine and the availability of in person appointments. The patient expressed understanding and agreed to proceed.   HPI:  Acute visit for R eye irritation: -started the last few days -symptoms include a little mucus in the eye, a little crusting, a little irritated - feels like something is in it -no known trauma -vision is ok -denies ha, fevers, redness or swelling of skin around   ROS: See pertinent positives and negatives per HPI.  Past Medical History:  Diagnosis Date  . Allergic rhinitis   . Arthritis    Osteoarthrits-knees-hx. RTKA  . Asthma    environmental agents induced asthma  . Coronary artery disease   . Family history of adverse reaction to anesthesia    daughter very sensitive to anesthesia  . GERD (gastroesophageal reflux disease)   . Heart murmur    hx. mitral valve proplapse-mostly asymptomatic-->not apprectiaed n echo  . Hepatic steatosis 03/05/2008   [place]  . Hyperlipidemia   . Hypertension   . Morbid obesity due to excess calories (HCC)   . STEMI (ST elevation myocardial infarction) (HCC) 10/2017   LAD treated with DESx1  . UTI (urinary tract infection)   . Vitamin D deficiency   . Vitamin D deficiency     Past Surgical History:  Procedure Laterality Date  . 5th finger surgery Right 1990   unknown injury  . CARDIAC CATHETERIZATION    . CHOLECYSTECTOMY    . COLONOSCOPY WITH PROPOFOL N/A 06/08/2016   Procedure: COLONOSCOPY WITH PROPOFOL;  Surgeon: Dorena Cookey, MD;  Location: [facility] ENDOSCOPY;  Service: Endoscopy;  Laterality: N/A;  . CORONARY STENT INTERVENTION  N/A 11/01/2017   Procedure: CORONARY STENT INTERVENTION;  Surgeon: Swaziland, Peter M, MD;  Location: [facility] INVASIVE CV LAB;  Service: Cardiovascular;  Laterality: N/A;  Resolute Onyx 4.5 mm x12 mm  . CORONARY/GRAFT ACUTE MI REVASCULARIZATION N/A 11/01/2017   Procedure: Coronary/Graft Acute MI Revascularization;  Surgeon: Swaziland, Peter M, MD;  Location: [facility] INVASIVE CV LAB;  Service: Cardiovascular;  Laterality: N/A;  . KNEE ARTHROSCOPY  05-02-12   x2 right/ x1 left  . LEFT HEART CATH AND CORONARY ANGIOGRAPHY N/A 11/01/2017   Procedure: LEFT HEART CATH AND CORONARY ANGIOGRAPHY;  Surgeon: Swaziland, Peter M, MD;  Location: [facility] INVASIVE CV LAB;  Service: Cardiovascular;  Laterality: N/A;  . REPLACEMENT TOTAL KNEE Right 10/2010  . TOTAL KNEE ARTHROPLASTY  05/10/2012   Procedure: TOTAL KNEE ARTHROPLASTY;  Surgeon: Loanne Drilling, MD;  Location: [facility] ORS;  Service: Orthopedics;  Laterality: Left;  . TUBAL LIGATION    . UMBILICAL HERNIA REPAIR      Family History  Problem Relation Age of Onset  . Hypertension Mother   . Arthritis Mother   . Lymphoma Mother   . Asthma Mother   . Cancer Mother   . Hearing loss Mother   . Miscarriages / [place] Mother   . COPD Father   . Alcohol abuse Father   . Arthritis Father   . Hearing loss Father   . Hypertension Father   . Miscarriages / Stillbirths Sister   . Arthritis Maternal Grandmother   . Hearing loss Maternal  Grandmother   . Breast cancer Maternal Grandmother 5  . Arthritis Maternal Grandfather   . Diabetes Maternal Grandfather   . Hypertension Maternal Grandfather   . Stroke Maternal Grandfather   . Arthritis Paternal Grandmother   . Diabetes Paternal Grandmother   . Hearing loss Paternal Grandmother   . Hearing loss Paternal Grandfather   . Learning disabilities Paternal Grandfather     SOCIAL HX: see hpi   Current Outpatient Medications:  .  albuterol (VENTOLIN HFA) 108 (90 Base) MCG/ACT inhaler, Inhale 1-2 puffs into the lungs every 6  (six) hours as needed for wheezing or shortness of breath., Disp: 6 g, Rfl: 11 .  aspirin EC 81 MG EC tablet, Take 1 tablet (81 mg total) by mouth daily., Disp: , Rfl:  .  atorvastatin (LIPITOR) 80 MG tablet, TAKE 1 TABLET (80 MG TOTAL) BY MOUTH DAILY AT 6 PM., Disp: 90 tablet, Rfl: 3 .  azelastine (OPTIVAR) 0.05 % ophthalmic solution, azelastine 0.05 % eye drops, Disp: , Rfl:  .  budesonide-formoterol (SYMBICORT) 160-4.5 MCG/ACT inhaler, Inhale 2 puffs into the lungs 2 (two) times daily., Disp: 3 Inhaler, Rfl: 1 .  candesartan (ATACAND) 4 MG tablet, TAKE 1 TABLET BY MOUTH EVERY DAY, Disp: 90 tablet, Rfl: 2 .  cholecalciferol (VITAMIN D) 1000 units tablet, Take 1,000 Units by mouth 2 (two) times daily., Disp: , Rfl:  .  diclofenac sodium (VOLTAREN) 1 % GEL, Apply 2-4 g topically daily as needed (as directed to painful sites). , Disp: , Rfl:  .  ipratropium (ATROVENT) 0.03 % nasal spray, Place 2 sprays into both nostrils daily., Disp: 30 mL, Rfl: 5 .  levalbuterol (XOPENEX) 1.25 MG/3ML nebulizer solution, levalbuterol 1.25 mg/3 mL solution for nebulization  USE 1 VIAL VIA NEBULIZER 3 TIMES A DAY, Disp: , Rfl:  .  levocetirizine (XYZAL) 5 MG tablet, Take 1 tablet (5 mg total) by mouth every evening., Disp: 90 tablet, Rfl: 3 .  metoprolol succinate (TOPROL-XL) 50 MG 24 hr tablet, TAKE 1 TABLET BY MOUTH EVERY DAY WITH OR IMMEDIATELY FOLLOWING A MEAL, Disp: 90 tablet, Rfl: 3 .  mometasone (NASONEX) 50 MCG/ACT nasal spray, Place 2 sprays into the nose every morning., Disp: 17 g, Rfl: 5 .  montelukast (SINGULAIR) 10 MG tablet, Take 1 tablet (10 mg total) by mouth at bedtime., Disp: 90 tablet, Rfl: 3 .  Multiple Vitamin (MULTIVITAMIN) tablet, Take 1 tablet by mouth daily., Disp: , Rfl:  .  erythromycin ophthalmic ointment, Place 1 application into the right eye 4 (four) times daily. For 5-7 days, Disp: 3.5 g, Rfl: 0 .  nitroGLYCERIN (NITROSTAT) 0.4 MG SL tablet, Place 1 tablet (0.4 mg total) under the tongue  every 5 (five) minutes as needed for chest pain., Disp: 25 tablet, Rfl: 2  EXAM:  VITALS per patient if applicable:  GENERAL: alert, oriented, appears well and in no acute distress  HEENT: atraumatic, conjunttiva clear on limited video visit exam, no purulent discharge, swelling or redness of the eye observe, no obvious abnormalities on inspection of external nose and ears  NECK: normal movements of the head and neck  LUNGS: on inspection no signs of respiratory distress, breathing rate appears normal, no obvious gross SOB, gasping or wheezing  CV: no obvious cyanosis  MS: moves all visible extremities without noticeable abnormality  PSYCH/NEURO: pleasant and cooperative, no obvious depression or anxiety, speech and thought processing grossly intact  ASSESSMENT AND PLAN:  Discussed the following assessment and plan:  Eye irritation  Discharge  from eye  -we discussed possible serious and likely etiologies, options for evaluation and workup, limitations of telemedicine visit vs in person visit, treatment, treatment risks and precautions. Pt prefers to treat via telemedicine empirically rather then risking or undertaking an in person visit at this moment. Query mild bacterial conjunctivitis vs foreign body vs other. She opted to try saline flush, rx sent for top erythro optho ointment qid x 5-7 days and agrees to seek prompt inperson evaluation if worsening, new symptoms arise, or if is not improving with treatment.    I discussed the assessment and treatment plan with the patient. The patient was provided an opportunity to ask questions and all were answered. The patient agreed with the plan and demonstrated an understanding of the instructions.      Terressa Koyanagi, DO

## 2020-01-03 ENCOUNTER — Other Ambulatory Visit: Payer: Self-pay

## 2020-01-03 ENCOUNTER — Emergency Department (HOSPITAL_BASED_OUTPATIENT_CLINIC_OR_DEPARTMENT_OTHER)
Admission: EM | Admit: 2020-01-03 | Discharge: 2020-01-03 | Disposition: A | Discharge status: Home / Self Care | Payer: BC Managed Care – PPO | Attending: Emergency Medicine | Admitting: Emergency Medicine

## 2020-01-03 ENCOUNTER — Emergency Department (HOSPITAL_BASED_OUTPATIENT_CLINIC_OR_DEPARTMENT_OTHER): Payer: BC Managed Care – PPO

## 2020-01-03 ENCOUNTER — Encounter (HOSPITAL_BASED_OUTPATIENT_CLINIC_OR_DEPARTMENT_OTHER): Payer: Self-pay | Admitting: Emergency Medicine

## 2020-01-03 DIAGNOSIS — S46211A Strain of muscle, fascia and tendon of other parts of biceps, right arm, initial encounter: Secondary | ICD-10-CM | POA: Insufficient documentation

## 2020-01-03 DIAGNOSIS — Z7982 Long term (current) use of aspirin: Secondary | ICD-10-CM | POA: Insufficient documentation

## 2020-01-03 DIAGNOSIS — Z79899 Other long term (current) drug therapy: Secondary | ICD-10-CM | POA: Insufficient documentation

## 2020-01-03 DIAGNOSIS — I2510 Atherosclerotic heart disease of native coronary artery without angina pectoris: Secondary | ICD-10-CM | POA: Insufficient documentation

## 2020-01-03 DIAGNOSIS — Z955 Presence of coronary angioplasty implant and graft: Secondary | ICD-10-CM | POA: Diagnosis not present

## 2020-01-03 DIAGNOSIS — Z96653 Presence of artificial knee joint, bilateral: Secondary | ICD-10-CM | POA: Diagnosis not present

## 2020-01-03 DIAGNOSIS — Y9366 Activity, soccer: Secondary | ICD-10-CM | POA: Insufficient documentation

## 2020-01-03 DIAGNOSIS — J45909 Unspecified asthma, uncomplicated: Secondary | ICD-10-CM | POA: Insufficient documentation

## 2020-01-03 DIAGNOSIS — W208XXA Other cause of strike by thrown, projected or falling object, initial encounter: Secondary | ICD-10-CM | POA: Insufficient documentation

## 2020-01-03 DIAGNOSIS — Y92322 Soccer field as the place of occurrence of the external cause: Secondary | ICD-10-CM | POA: Diagnosis not present

## 2020-01-03 DIAGNOSIS — S4991XA Unspecified injury of right shoulder and upper arm, initial encounter: Secondary | ICD-10-CM | POA: Diagnosis not present

## 2020-01-03 DIAGNOSIS — M79621 Pain in right upper arm: Secondary | ICD-10-CM | POA: Diagnosis not present

## 2020-01-03 DIAGNOSIS — Z7951 Long term (current) use of inhaled steroids: Secondary | ICD-10-CM | POA: Diagnosis not present

## 2020-01-03 DIAGNOSIS — M79601 Pain in right arm: Secondary | ICD-10-CM | POA: Diagnosis not present

## 2020-01-03 DIAGNOSIS — I10 Essential (primary) hypertension: Secondary | ICD-10-CM | POA: Insufficient documentation

## 2020-01-03 MED ORDER — METHOCARBAMOL 500 MG PO TABS: 500.0000 mg | ORAL_TABLET | Freq: Every evening | ORAL | 0 refills | Status: DC | PRN

## 2020-01-03 NOTE — ED Provider Notes (Signed)
[facility] EMERGENCY DEPARTMENT Provider Note   CSN: [id number] Arrival date & time: 01/03/20  4268     History Chief Complaint  Patient presents with  . Arm Pain    Alyssa Phillips is a 64 y.o. female presenting for evaluation of right arm pain.  Patient states about a month ago she was lifting a foosball table in the back of her car when it fell back, landing on her shoulders.  Since then, she has had pain of her right anterior upper arm.  Over the past week, pain has been causing her to have difficulty sleeping.  She has been controlling her pain with Tylenol, but it seems it wears off she reports increased pain.  Pain is worse with movement of her arm and palpation.  Nothing makes it better.  No numbness or tingling.  No pain on the left side.  He has an orthopedic doctor, but has not followed up with them regarding this.  HPI     Past Medical History:  Diagnosis Date  . Allergic rhinitis   . Arthritis    Osteoarthrits-knees-hx. RTKA  . Asthma    environmental agents induced asthma  . Coronary artery disease   . Family history of adverse reaction to anesthesia    daughter very sensitive to anesthesia  . GERD (gastroesophageal reflux disease)   . Heart murmur    hx. mitral valve proplapse-mostly asymptomatic-->not apprectiaed n echo  . Hepatic steatosis 03/05/2008   [place]  . Hyperlipidemia   . Hypertension   . Morbid obesity due to excess calories (HCC)   . STEMI (ST elevation myocardial infarction) (HCC) 10/2017   LAD treated with DESx1  . UTI (urinary tract infection)   . Vitamin D deficiency   . Vitamin D deficiency     Patient Active Problem List   Diagnosis Date Noted  . Hyperparathyroidism (HCC) 07/22/2019  . Hypercalcemia 07/22/2019  . Allergic rhinitis due to pollen 12/18/2018  . Seasonal allergies 05/31/2018  . Pain in left foot 04/04/2018  . STEMI involving left anterior descending coronary artery (HCC) 12/04/2017  . Morbid obesity (HCC)  12/04/2017  . Hypertension 12/04/2017  . Encounter for general adult medical examination with abnormal findings 12/04/2017  . Moderate persistent asthma without complication 12/04/2017  . Hyperlipidemia   . Pain in joint of left shoulder 06/12/2017  . Osteoarthrosis involving lower leg 05/10/2012    Past Surgical History:  Procedure Laterality Date  . 5th finger surgery Right 1990   unknown injury  . CARDIAC CATHETERIZATION    . CHOLECYSTECTOMY    . COLONOSCOPY WITH PROPOFOL N/A 06/08/2016   Procedure: COLONOSCOPY WITH PROPOFOL;  Surgeon: Dorena Cookey, MD;  Location: [facility] ENDOSCOPY;  Service: Endoscopy;  Laterality: N/A;  . CORONARY STENT INTERVENTION N/A 11/01/2017   Procedure: CORONARY STENT INTERVENTION;  Surgeon: Swaziland, Peter M, MD;  Location: [facility] INVASIVE CV LAB;  Service: Cardiovascular;  Laterality: N/A;  Resolute Onyx 4.5 mm x12 mm  . CORONARY/GRAFT ACUTE MI REVASCULARIZATION N/A 11/01/2017   Procedure: Coronary/Graft Acute MI Revascularization;  Surgeon: [place]azil[place], Peter M, MD;  Location: [facility] INVASIVE CV LAB;  Service: Cardiovascular;  Laterality: N/A;  . KNEE ARTHROSCOPY  05-02-12   x2 right/ x1 left  . LEFT HEART CATH AND CORONARY ANGIOGRAPHY N/A 11/01/2017   Procedure: LEFT HEART CATH AND CORONARY ANGIOGRAPHY;  Surgeon: [place]azil[place], Peter M, MD;  Location: [facility] INVASIVE CV LAB;  Service: Cardiovascular;  Laterality: N/A;  . REPLACEMENT TOTAL KNEE Right 10/2010  . TOTAL  KNEE ARTHROPLASTY  05/10/2012   Procedure: TOTAL KNEE ARTHROPLASTY;  Surgeon: Loanne Drilling, MD;  Location: [facility] ORS;  Service: Orthopedics;  Laterality: Left;  . TUBAL LIGATION    . UMBILICAL HERNIA REPAIR       OB History    Gravida  3   Para  3   Term      Preterm      AB      Living  3     SAB      TAB      Ectopic      Multiple      Live Births              Family History  Problem Relation Age of Onset  . Hypertension Mother   . Arthritis Mother   . Lymphoma Mother   . Asthma Mother   .  Cancer Mother   . Hearing loss Mother   . Miscarriages / [place] Mother   . COPD Father   . Alcohol abuse Father   . Arthritis Father   . Hearing loss Father   . Hypertension Father   . Miscarriages / Stillbirths Sister   . Arthritis Maternal Grandmother   . Hearing loss Maternal Grandmother   . Breast cancer Maternal Grandmother 80  . Arthritis Maternal Grandfather   . Diabetes Maternal Grandfather   . Hypertension Maternal Grandfather   . Stroke Maternal Grandfather   . Arthritis Paternal Grandmother   . Diabetes Paternal Grandmother   . Hearing loss Paternal Grandmother   . Hearing loss Paternal Grandfather   . Learning disabilities Paternal Grandfather     Social History   Tobacco Use  . Smoking status: Never Smoker  . Smokeless tobacco: Never Used  Vaping Use  . Vaping Use: Never used  Substance Use Topics  . Alcohol use: Yes    Comment: occ. rare  . Drug use: No    Home Medications Prior to Admission medications   Medication Sig Start Date End Date Taking? Authorizing Provider  albuterol (VENTOLIN HFA) 108 (90 Base) MCG/ACT inhaler Inhale 1-2 puffs into the lungs every 6 (six) hours as needed for wheezing or shortness of breath. 12/18/18   Kuneff, Renee A, DO  aspirin EC 81 MG EC tablet Take 1 tablet (81 mg total) by mouth daily. 11/03/17   Robbie Lis M, PA-C  atorvastatin (LIPITOR) 80 MG tablet TAKE 1 TABLET (80 MG TOTAL) BY MOUTH DAILY AT 6 PM. 07/08/19   [place], Peter M, MD  azelastine (OPTIVAR) 0.05 % ophthalmic solution azelastine 0.05 % eye drops    [provider]  budesonide-formoterol (SYMBICORT) 160-4.5 MCG/ACT inhaler Inhale 2 puffs into the lungs 2 (two) times daily. 12/18/18   Kuneff, Renee A, DO  candesartan (ATACAND) 4 MG tablet TAKE 1 TABLET BY MOUTH EVERY DAY 12/16/19   [place], Peter M, MD  cholecalciferol (VITAMIN D) 1000 units tablet Take 1,000 Units by mouth 2 (two) times daily.    [provider]  diclofenac sodium  (VOLTAREN) 1 % GEL Apply 2-4 g topically daily as needed (as directed to painful sites).     [provider]  erythromycin ophthalmic ointment Place 1 application into the right eye 4 (four) times daily. For 5-7 days 12/25/19   Kriste Basque R, DO  ipratropium (ATROVENT) 0.03 % nasal spray Place 2 sprays into both nostrils daily. 12/18/18   Kuneff, Renee A, DO  levalbuterol (XOPENEX) 1.25 MG/3ML nebulizer solution levalbuterol 1.25 mg/3 mL solution  for nebulization  USE 1 VIAL VIA NEBULIZER 3 TIMES A DAY    [provider]  levocetirizine (XYZAL) 5 MG tablet Take 1 tablet (5 mg total) by mouth every evening. 12/11/19   Kuneff, Renee A, DO  methocarbamol (ROBAXIN) 500 MG tablet Take 1 tablet (500 mg total) by mouth at bedtime as needed for muscle spasms. 01/03/20   Cage Gupton, PA-C  metoprolol succinate (TOPROL-XL) 50 MG 24 hr tablet TAKE 1 TABLET BY MOUTH EVERY DAY WITH OR IMMEDIATELY FOLLOWING A MEAL 07/09/19   [place], Peter M, MD  mometasone (NASONEX) 50 MCG/ACT nasal spray Place 2 sprays into the nose every morning. 12/18/18   Kuneff, Renee A, DO  montelukast (SINGULAIR) 10 MG tablet Take 1 tablet (10 mg total) by mouth at bedtime. 12/11/19   Kuneff, Renee A, DO  Multiple Vitamin (MULTIVITAMIN) tablet Take 1 tablet by mouth daily.    [provider]  nitroGLYCERIN (NITROSTAT) 0.4 MG SL tablet Place 1 tablet (0.4 mg total) under the tongue every 5 (five) minutes as needed for chest pain. 11/02/17 11/02/18  Allayne ButcherSimmons, Brittainy M, PA-C    Allergies    Compazine [prochlorperazine edisylate], Advil [ibuprofen], and Aspirin  Review of Systems   Review of Systems  Musculoskeletal: Positive for myalgias.  Neurological: Negative for numbness.    Physical Exam Updated Vital Signs BP 114/73 (BP Location: Left Arm)   Pulse 70   Temp 98.9 F (37.2 C) (Oral)   Resp 18   Ht 5\' 9"  (1.753 m)   Wt 111.1 kg   SpO2 99%   BMI 36.18 kg/m   Physical Exam Vitals and nursing note  reviewed.  Constitutional:      General: She is not in acute distress.    Appearance: She is well-developed.     Comments: Appears nontoxic  HENT:     Head: Normocephalic and atraumatic.  Pulmonary:     Effort: Pulmonary effort is normal.  Abdominal:     General: There is no distension.  Musculoskeletal:        General: Tenderness present. Normal range of motion.     Cervical back: Normal range of motion.     Comments: Tenderness palpation over the right biceps.  No obvious deformity.  No tenderness palpation over bony shoulder or elbow.  Increased pain with flexion of the elbow, no pain with extension.  Radial pulses 2+ bilaterally.  Grip strength equal bilaterally.  Skin:    General: Skin is warm.     Capillary Refill: Capillary refill takes less than 2 seconds.     Findings: No rash.  Neurological:     Mental Status: She is alert and oriented to person, place, and time.     ED Results / Procedures / Treatments   Labs (all labs ordered are listed, but only abnormal results are displayed) Labs Reviewed - No data to display  EKG None  Radiology DG Humerus Right  Result Date: 01/03/2020 CLINICAL DATA:  64 year old female with upper arm pain EXAM: RIGHT HUMERUS - 2+ VIEW COMPARISON:  None. FINDINGS: There is no evidence of fracture or other focal bone lesions. Soft tissues are unremarkable. IMPRESSION: Negative for acute bony abnormality Electronically Signed   By: Gilmer MorJaime  Wagner D.O.   On: 01/03/2020 11:16    Procedures Procedures (including critical care time)  Medications Ordered in ED Medications - No data to display  ED Course  I have reviewed the triage vital signs and the nursing notes.  Pertinent labs &  imaging results that were available during my care of the patient were reviewed by me and considered in my medical decision making (see chart for details).    MDM Rules/Calculators/A&P                          Patient presented for evaluation of right upper  arm pain.  On exam, pain is reproducible with palpation of the biceps.  Increased pain with use of the biceps.  No pain over bony protuberances.  X-ray obtained from triage read interpreted by me, no fracture dislocation.  Discussed findings with patient.  Discussed likely muscle strain.  Discussed continued symptomatic treatment Tylenol.  Use a sling as needed during the day.  Discussed use of muscle laxer at night, and follow-up with Ortho as needed for further evaluation.  At this time, patient appears safe for discharge.  Return precautions given.  Patient states she understands and agrees to plan.  Final Clinical Impression(s) / ED Diagnoses Final diagnoses:  Strain of right biceps muscle, initial encounter    Rx / DC Orders ED Discharge Orders         Ordered    methocarbamol (ROBAXIN) 500 MG tablet  At bedtime PRN        01/03/20 1247           Laycee Fitzsimmons, PA-C 01/03/20 1257    Arby Barrette, MD 01/04/20 1054

## 2020-01-03 NOTE — Discharge Instructions (Signed)
Take Tylenol as needed for pain. Continue using muscle creams such as Tiger balm to help with pain control. Use the muscle relaxer as needed at night for pain control. Use the sling as needed during the the day to help with pain. Follow-up with [facility] as needed for further evaluation of your pain. Return to the emergency room if you develop severe worsening pain, numbness in your hand, color change of your hand, or any new, worsening, or concerning symptoms.

## 2020-01-03 NOTE — ED Triage Notes (Signed)
Pain to R upper arm x 1 month. States it started after lifting a heavy table. States it has been waking her up at night over the last week.

## 2020-01-06 DIAGNOSIS — M25511 Pain in right shoulder: Secondary | ICD-10-CM | POA: Insufficient documentation

## 2020-01-07 DIAGNOSIS — M25511 Pain in right shoulder: Secondary | ICD-10-CM | POA: Diagnosis not present

## 2020-01-07 DIAGNOSIS — M25512 Pain in left shoulder: Secondary | ICD-10-CM | POA: Diagnosis not present

## 2020-01-08 ENCOUNTER — Encounter: Payer: Self-pay | Admitting: Internal Medicine

## 2020-01-08 ENCOUNTER — Ambulatory Visit: Payer: BC Managed Care – PPO | Admitting: Internal Medicine

## 2020-01-08 ENCOUNTER — Other Ambulatory Visit: Payer: Self-pay

## 2020-01-08 VITALS — BP 130/74 | HR 80 | Ht 69.0 in

## 2020-01-08 DIAGNOSIS — E213 Hyperparathyroidism, unspecified: Secondary | ICD-10-CM | POA: Diagnosis not present

## 2020-01-08 NOTE — Patient Instructions (Signed)
-   Stay Hydrated  - Avoid OVER THE COUNTER calcium tablets - Make sure you consume 2-3 servings of calcium a day in your diet

## 2020-01-08 NOTE — Progress Notes (Signed)
Name: Alyssa Phillips  MRN/ DOB: [id number], [birth year]    Age/ Sex: 64 y.o., female     PCP: Natalia Leatherwood, DO   Reason for Endocrinology Evaluation: Hypercalcemia      Initial Endocrinology Clinic Visit: 09/05/2019    PATIENT IDENTIFIER: Alyssa Phillips is a 64 y.o., female with a past medical history of  CAD ( 10/2017) , HTN and Dyslipidemia.  She has followed with [facility] since 09/05/2019 for consultative assistance with management of her Hyperthyroidism  HISTORICAL SUMMARY:  Alyssa Phillips indicates that she was first diagnosed with hypercalcemia in 06/2019 with a serum calcium of 10.9 mg/dL ( Corrected 30.86) during routine check up   she has broken a leg in the past ( S/P slip injury) and has osteopenia based on DXA from 02/2019  Ca/Cr ratio was 0.0054  SUBJECTIVE:   Today (01/08/2020):  Alyssa Phillips is here for a follow up on hypercalcemia.    Denies polydipsia but has frequency No renal stones Left ankle stress fracture after trip injury- in a cast   Not on OTC calcium  Consumes  1 Servings of dairy  She is on vitamin D   HISTORY:  Past Medical History:  Past Medical History:  Diagnosis Date  . Allergic rhinitis   . Arthritis    Osteoarthrits-knees-hx. RTKA  . Asthma    environmental agents induced asthma  . Coronary artery disease   . Family history of adverse reaction to anesthesia    daughter very sensitive to anesthesia  . GERD (gastroesophageal reflux disease)   . Heart murmur    hx. mitral valve proplapse-mostly asymptomatic-->not apprectiaed n echo  . Hepatic steatosis 03/05/2008   [place]  . Hyperlipidemia   . Hypertension   . Morbid obesity due to excess calories (HCC)   . STEMI (ST elevation myocardial infarction) (HCC) 10/2017   LAD treated with DESx1  . UTI (urinary tract infection)   . Vitamin D deficiency   . Vitamin D deficiency    Past Surgical History:  Past Surgical History:  Procedure Laterality Date  . 5th  finger surgery Right 1990   unknown injury  . CARDIAC CATHETERIZATION    . CHOLECYSTECTOMY    . COLONOSCOPY WITH PROPOFOL N/A 06/08/2016   Procedure: COLONOSCOPY WITH PROPOFOL;  Surgeon: Dorena Cookey, MD;  Location: [facility] ENDOSCOPY;  Service: Endoscopy;  Laterality: N/A;  . CORONARY STENT INTERVENTION N/A 11/01/2017   Procedure: CORONARY STENT INTERVENTION;  Surgeon: Swaziland, Peter M, MD;  Location: [facility] INVASIVE CV LAB;  Service: Cardiovascular;  Laterality: N/A;  Resolute Onyx 4.5 mm x12 mm  . CORONARY/GRAFT ACUTE MI REVASCULARIZATION N/A 11/01/2017   Procedure: Coronary/Graft Acute MI Revascularization;  Surgeon: Swaziland, Peter M, MD;  Location: [facility] LAB;  Service: Cardiovascular;  Laterality: N/A;  . KNEE ARTHROSCOPY  05-02-12   x2 right/ x1 left  . LEFT HEART CATH AND CORONARY ANGIOGRAPHY N/A 11/01/2017   Procedure: LEFT HEART CATH AND CORONARY ANGIOGRAPHY;  Surgeon: Swaziland, Peter M, MD;  Location: [facility] INVASIVE CV LAB;  Service: Cardiovascular;  Laterality: N/A;  . REPLACEMENT TOTAL KNEE Right 10/2010  . TOTAL KNEE ARTHROPLASTY  05/10/2012   Procedure: TOTAL KNEE ARTHROPLASTY;  Surgeon: Loanne Drilling, MD;  Location: [facility] ORS;  Service: Orthopedics;  Laterality: Left;  . TUBAL LIGATION    . UMBILICAL HERNIA REPAIR      Social History:  reports that she has never smoked. She has never used smokeless tobacco. She reports current  alcohol use. She reports that she does not use drugs. Family History:  Family History  Problem Relation Age of Onset  . Hypertension Mother   . Arthritis Mother   . Lymphoma Mother   . Asthma Mother   . Cancer Mother   . Hearing loss Mother   . Miscarriages / [place] Mother   . COPD Father   . Alcohol abuse Father   . Arthritis Father   . Hearing loss Father   . Hypertension Father   . Miscarriages / Stillbirths Sister   . Arthritis Maternal Grandmother   . Hearing loss Maternal Grandmother   . Breast cancer Maternal Grandmother [id number]  . Arthritis  Maternal Grandfather   . Diabetes Maternal Grandfather   . Hypertension Maternal Grandfather   . Stroke Maternal Grandfather   . Arthritis Paternal Grandmother   . Diabetes Paternal Grandmother   . Hearing loss Paternal Grandmother   . Hearing loss Paternal Grandfather   . Learning disabilities Paternal Grandfather      HOME MEDICATIONS: Allergies as of 01/08/2020      Reactions   Compazine [prochlorperazine Edisylate] Other (See Comments)   Eyes rolled back, tongue curled up   Advil [ibuprofen] Hives   Aspirin Other (See Comments)   HAS to be EC aspirin!!      Medication List       Accurate as of January 08, 2020  8:23 AM. If you have any questions, ask your nurse or doctor.        STOP taking these medications   ipratropium 0.03 % nasal spray Commonly known as: ATROVENT Stopped by: Scarlette ShortsIbtehal J Webb Weed, MD     TAKE these medications   albuterol 108 (90 Base) MCG/ACT inhaler Commonly known as: VENTOLIN HFA Inhale 1-2 puffs into the lungs every 6 (six) hours as needed for wheezing or shortness of breath.   aspirin 81 MG EC tablet Take 1 tablet (81 mg total) by mouth daily.   atorvastatin 80 MG tablet Commonly known as: LIPITOR TAKE 1 TABLET (80 MG TOTAL) BY MOUTH DAILY AT 6 PM.   azelastine 0.05 % ophthalmic solution Commonly known as: OPTIVAR azelastine 0.05 % eye drops   budesonide-formoterol 160-4.5 MCG/ACT inhaler Commonly known as: SYMBICORT Inhale 2 puffs into the lungs 2 (two) times daily.   candesartan 4 MG tablet Commonly known as: ATACAND TAKE 1 TABLET BY MOUTH EVERY DAY   cholecalciferol 1000 units tablet Commonly known as: VITAMIN D Take 1,000 Units by mouth 2 (two) times daily.   diclofenac sodium 1 % Gel Commonly known as: VOLTAREN Apply 2-4 g topically daily as needed (as directed to painful sites).   erythromycin ophthalmic ointment Place 1 application into the right eye 4 (four) times daily. For 5-7 days   levalbuterol 1.25  MG/3ML nebulizer solution Commonly known as: XOPENEX levalbuterol 1.25 mg/3 mL solution for nebulization  USE 1 VIAL VIA NEBULIZER 3 TIMES A DAY   levocetirizine 5 MG tablet Commonly known as: XYZAL Take 1 tablet (5 mg total) by mouth every evening.   methocarbamol 500 MG tablet Commonly known as: ROBAXIN Take 1 tablet (500 mg total) by mouth at bedtime as needed for muscle spasms.   metoprolol succinate 50 MG 24 hr tablet Commonly known as: TOPROL-XL TAKE 1 TABLET BY MOUTH EVERY DAY WITH OR IMMEDIATELY FOLLOWING A MEAL   mometasone 50 MCG/ACT nasal spray Commonly known as: NASONEX Place 2 sprays into the nose every morning.   montelukast 10 MG tablet Commonly known as:  SINGULAIR Take 1 tablet (10 mg total) by mouth at bedtime.   multivitamin tablet Take 1 tablet by mouth daily.   nitroGLYCERIN 0.4 MG SL tablet Commonly known as: Nitrostat Place 1 tablet (0.4 mg total) under the tongue every 5 (five) minutes as needed for chest pain.         OBJECTIVE:   PHYSICAL EXAM: VS: BP 130/74 (BP Location: Left Arm, Patient Position: Sitting, Cuff Size: Large)   Pulse 80   Ht 5\' 9"  (1.753 m)   SpO2 96%   BMI 36.18 kg/m    EXAM: General: Pt appears well and is in NAD  Neck: General: Supple without adenopathy. Thyroid: Thyroid size normal.  No goiter or nodules appreciated. No thyroid bruit.  Lungs: Clear with good BS bilat with no rales, rhonchi, or wheezes  Heart: Auscultation: RRR.  Abdomen: Normoactive bowel sounds, soft, nontender, without masses or organomegaly palpable  Extremities:  BL LE: No pretibial edema on right, left boot in place  Mental Status: Judgment, insight: Intact Orientation: Oriented to time, place, and person Mood and affect: No depression, anxiety, or agitation     DATA REVIEWED:  Results for IASIA, FORCIER (MRN Tawnya Crook) as of 01/08/2020 08:25  Ref. Range 12/11/2019 08:33  Sodium Latest Ref Range: 135 - 145 mEq/L 141  Potassium  Latest Ref Range: 3.5 - 5.1 mEq/L 4.4  Chloride Latest Ref Range: 96 - 112 mEq/L 109  CO2 Latest Ref Range: 19 - 32 mEq/L 28  Glucose Latest Ref Range: 70 - 99 mg/dL 83  BUN Latest Ref Range: 6 - 23 mg/dL 21  Creatinine Latest Ref Range: 0.40 - 1.20 mg/dL 12/13/2019  Calcium Latest Ref Range: 8.4 - 10.5 mg/dL 4.03 (H)  Alkaline Phosphatase Latest Ref Range: 39 - 117 U/L 56  Albumin Latest Ref Range: 3.5 - 5.2 g/dL 4.0  AST Latest Ref Range: 0 - 37 U/L 14  ALT Latest Ref Range: 0 - 35 U/L 17  Total Protein Latest Ref Range: 6.0 - 8.3 g/dL 6.3  Total Bilirubin Latest Ref Range: 0.2 - 1.2 mg/dL 0.6  GFR Latest Ref Range: >60.00 mL/min 89.93     Results for ADALYNA, GODBEE (MRN Tawnya Crook) as of 01/08/2020 08:25  Ref. Range 12/11/2019 08:33  PTH, Intact Latest Ref Range: 14 - 64 pg/mL 76 (H)   Results for KRISTY, SCHOMBURG (MRN Tawnya Crook) as of 01/08/2020 08:25  Ref. Range 09/25/2019 11:18  Calcium, 24H Urine Latest Units: mg/24 h 84  Creatinine, 24H Ur Latest Ref Range: 0.50 - 2.15 g/24 h 1.26    ASSESSMENT / PLAN / RECOMMENDATIONS:   1. 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  We have discussed repeating 24-hour urine collection in the future and if she continues to have consistent results we will consider genetic testing for FHH -At this time her labs are stable with no evidence of any endorgan damage.   Recommendations  - Stay Hydrated  - Avoid OVER THE COUNTER calcium tablets - Make sure you consume 2-3 servings of calcium a day in your diet   Follow-up in 6 months   Signed electronically by: 11/25/2019, MD  [facility] Endocrinology  [facility] [address] [place], [place] Phone: [phone] FAX: [phone]      CC: [phone],  [place] [address] [place] [address] Phone: [phone]  Fax: [phone]   Return to Endocrinology clinic as below: Future Appointments  Date Time Provider Department Center  01/08/2020  8:30 AM Jeannine Pennisi, Konrad Dolores, MD [facility] None  03/15/2020  8:45 AM [facility] NURSE [facility]

## 2020-01-19 ENCOUNTER — Encounter (HOSPITAL_COMMUNITY): Payer: Self-pay | Admitting: *Deleted

## 2020-01-19 ENCOUNTER — Emergency Department (HOSPITAL_COMMUNITY): Payer: BC Managed Care – PPO

## 2020-01-19 ENCOUNTER — Other Ambulatory Visit: Payer: Self-pay

## 2020-01-19 ENCOUNTER — Emergency Department (HOSPITAL_COMMUNITY)
Admission: EM | Admit: 2020-01-19 | Discharge: 2020-01-19 | Disposition: A | Discharge status: Home / Self Care | Payer: BC Managed Care – PPO | Attending: Emergency Medicine | Admitting: Emergency Medicine

## 2020-01-19 DIAGNOSIS — Z7951 Long term (current) use of inhaled steroids: Secondary | ICD-10-CM | POA: Insufficient documentation

## 2020-01-19 DIAGNOSIS — Z951 Presence of aortocoronary bypass graft: Secondary | ICD-10-CM | POA: Insufficient documentation

## 2020-01-19 DIAGNOSIS — J4540 Moderate persistent asthma, uncomplicated: Secondary | ICD-10-CM | POA: Insufficient documentation

## 2020-01-19 DIAGNOSIS — Z7982 Long term (current) use of aspirin: Secondary | ICD-10-CM | POA: Insufficient documentation

## 2020-01-19 DIAGNOSIS — R079 Chest pain, unspecified: Secondary | ICD-10-CM | POA: Insufficient documentation

## 2020-01-19 DIAGNOSIS — Z79899 Other long term (current) drug therapy: Secondary | ICD-10-CM | POA: Diagnosis not present

## 2020-01-19 DIAGNOSIS — I2510 Atherosclerotic heart disease of native coronary artery without angina pectoris: Secondary | ICD-10-CM | POA: Diagnosis not present

## 2020-01-19 DIAGNOSIS — R0789 Other chest pain: Secondary | ICD-10-CM | POA: Diagnosis not present

## 2020-01-19 DIAGNOSIS — R0602 Shortness of breath: Secondary | ICD-10-CM | POA: Diagnosis not present

## 2020-01-19 DIAGNOSIS — I10 Essential (primary) hypertension: Secondary | ICD-10-CM | POA: Diagnosis not present

## 2020-01-19 DIAGNOSIS — Z96651 Presence of right artificial knee joint: Secondary | ICD-10-CM | POA: Insufficient documentation

## 2020-01-19 LAB — COMPREHENSIVE METABOLIC PANEL
ALT: 17 U/L (ref 0–44)
AST: 16 U/L (ref 15–41)
Albumin: 3.2 g/dL — ABNORMAL LOW (ref 3.5–5.0)
Alkaline Phosphatase: 55 U/L (ref 38–126)
Anion gap: 10 (ref 5–15)
BUN: 17 mg/dL (ref 8–23)
CO2: 21 mmol/L — ABNORMAL LOW (ref 22–32)
Calcium: 10.3 mg/dL (ref 8.9–10.3)
Chloride: 105 mmol/L (ref 98–111)
Creatinine, Ser: 0.62 mg/dL (ref 0.44–1.00)
GFR calc Af Amer: >60 mL/min (ref >60)
GFR calc non Af Amer: >60 mL/min (ref >60)
Glucose, Bld: 91 mg/dL (ref 70–99)
Potassium: 3.9 mmol/L (ref 3.5–5.1)
Sodium: 136 mmol/L (ref 135–145)
Total Bilirubin: 0.7 mg/dL (ref 0.3–1.2)
Total Protein: 5.8 g/dL — ABNORMAL LOW (ref 6.5–8.1)

## 2020-01-19 LAB — CBC
HCT: 42.2 % (ref 36.0–46.0)
Hemoglobin: 13.6 g/dL (ref 12.0–15.0)
MCH: 30.8 pg (ref 26.0–34.0)
MCHC: 32.2 g/dL (ref 30.0–36.0)
MCV: 95.7 fL (ref 80.0–100.0)
Platelets: 241 10*3/uL (ref 150–400)
RBC: 4.41 MIL/uL (ref 3.87–5.11)
RDW: 11.9 % (ref 11.5–15.5)
WBC: 7 10*3/uL (ref 4.0–10.5)
nRBC: 0 % (ref 0.0–0.2)

## 2020-01-19 LAB — TROPONIN I (HIGH SENSITIVITY)
Troponin I (High Sensitivity): <2 ng/L (ref <18)
Troponin I (High Sensitivity): <2 ng/L (ref <18)

## 2020-01-19 NOTE — ED Provider Notes (Signed)
[facility] EMERGENCY DEPARTMENT Provider Note   CSN: [id number] Arrival date & time: 01/19/20  1231     History Chief Complaint  Patient presents with  . Chest Pain    Alyssa Phillips is a 64 y.o. female w/past medical history of CAD, STEMI s/p LAD treated with DES x1 in 2019, asthma, hyperlipidemia, hypertension presents emerged department today for chest pain.  Patient states that chest pain began around 11 AM this morning while she was working, was lifting something over her head, states that it was in the center of her chest sharp and felt like pressure.  States that it feels  similar to her previous MI.  States that she took 4 aspirin and nitro and the chest pain subsided after this.  States that chest pain stayed until she took her nitro which was 15 minutes later.  States that she has been chest pain-free since 1115.  States that she was in normal health yesterday.  States that when she was having her chest pain, chest pain did not radiate anywhere.  States that she has not taken her nitro since last year when she was having chest pain then.  Denies any nausea, vomiting, shortness of breath, sweating, dizziness, pain, leg pain or leg swelling.  Denies cough, URI symptoms. Is not on any blood thinners.  No numbness, tingling, weakness.    Per chart review echo in 2019 showed LVEF 40 to 45%.  Cardiac cath in 2019 with 95% stenosis LAD, successful PCI of the proximal LAD with DES x1.  Post intervention there was 0% residual stenosis.   HPI     Past Medical History:  Diagnosis Date  . Allergic rhinitis   . Arthritis    Osteoarthrits-knees-hx. RTKA  . Asthma    environmental agents induced asthma  . Coronary artery disease   . Family history of adverse reaction to anesthesia    daughter very sensitive to anesthesia  . GERD (gastroesophageal reflux disease)   . Heart murmur    hx. mitral valve proplapse-mostly asymptomatic-->not apprectiaed n echo  . Hepatic  steatosis 03/05/2008   [place]  . Hyperlipidemia   . Hypertension   . Morbid obesity due to excess calories (HCC)   . STEMI (ST elevation myocardial infarction) (HCC) 10/2017   LAD treated with DESx1  . UTI (urinary tract infection)   . Vitamin D deficiency   . Vitamin D deficiency     Patient Active Problem List   Diagnosis Date Noted  . Hyperparathyroidism (HCC) 07/22/2019  . Hypercalcemia 07/22/2019  . Allergic rhinitis due to pollen 12/18/2018  . Seasonal allergies 05/31/2018  . Pain in left foot 04/04/2018  . STEMI involving left anterior descending coronary artery (HCC) 12/04/2017  . Morbid obesity (HCC) 12/04/2017  . Hypertension 12/04/2017  . Encounter for general adult medical examination with abnormal findings 12/04/2017  . Moderate persistent asthma without complication 12/04/2017  . Hyperlipidemia   . Pain in joint of left shoulder 06/12/2017  . Osteoarthrosis involving lower leg 05/10/2012    Past Surgical History:  Procedure Laterality Date  . 5th finger surgery Right 1990   unknown injury  . CARDIAC CATHETERIZATION    . CHOLECYSTECTOMY    . COLONOSCOPY WITH PROPOFOL N/A 06/08/2016   Procedure: COLONOSCOPY WITH PROPOFOL;  Surgeon: Dorena Cookey, MD;  Location: [facility] ENDOSCOPY;  Service: Endoscopy;  Laterality: N/A;  . CORONARY STENT INTERVENTION N/A 11/01/2017   Procedure: CORONARY STENT INTERVENTION;  Surgeon: Swaziland, Peter M, MD;  Location:  [facility] INVASIVE CV LAB;  Service: Cardiovascular;  Laterality: N/A;  Resolute Onyx 4.5 mm x12 mm  . CORONARY/GRAFT ACUTE MI REVASCULARIZATION N/A 11/01/2017   Procedure: Coronary/Graft Acute MI Revascularization;  Surgeon: Swaziland, Peter M, MD;  Location: [facility] INVASIVE CV LAB;  Service: Cardiovascular;  Laterality: N/A;  . KNEE ARTHROSCOPY  05-02-12   x2 right/ x1 left  . LEFT HEART CATH AND CORONARY ANGIOGRAPHY N/A 11/01/2017   Procedure: LEFT HEART CATH AND CORONARY ANGIOGRAPHY;  Surgeon: [place]aziland, Peter M, MD;  Location: [facility] INVASIVE CV LAB;   Service: Cardiovascular;  Laterality: N/A;  . REPLACEMENT TOTAL KNEE Right 10/2010  . TOTAL KNEE ARTHROPLASTY  05/10/2012   Procedure: TOTAL KNEE ARTHROPLASTY;  Surgeon: Loanne Drilling, MD;  Location: [facility] ORS;  Service: Orthopedics;  Laterality: Left;  . TUBAL LIGATION    . UMBILICAL HERNIA REPAIR       OB History    Gravida  3   Para  3   Term      Preterm      AB      Living  3     SAB      TAB      Ectopic      Multiple      Live Births              Family History  Problem Relation Age of Onset  . Hypertension Mother   . Arthritis Mother   . Lymphoma Mother   . Asthma Mother   . Cancer Mother   . Hearing loss Mother   . Miscarriages / [place] Mother   . COPD Father   . Alcohol abuse Father   . Arthritis Father   . Hearing loss Father   . Hypertension Father   . Miscarriages / Stillbirths Sister   . Arthritis Maternal Grandmother   . Hearing loss Maternal Grandmother   . Breast cancer Maternal Grandmother 46  . Arthritis Maternal Grandfather   . Diabetes Maternal Grandfather   . Hypertension Maternal Grandfather   . Stroke Maternal Grandfather   . Arthritis Paternal Grandmother   . Diabetes Paternal Grandmother   . Hearing loss Paternal Grandmother   . Hearing loss Paternal Grandfather   . Learning disabilities Paternal Grandfather     Social History   Tobacco Use  . Smoking status: Never Smoker  . Smokeless tobacco: Never Used  Vaping Use  . Vaping Use: Never used  Substance Use Topics  . Alcohol use: Yes    Comment: occ. rare  . Drug use: No    Home Medications Prior to Admission medications   Medication Sig Start Date End Date Taking? Authorizing Provider  albuterol (VENTOLIN HFA) 108 (90 Base) MCG/ACT inhaler Inhale 1-2 puffs into the lungs every 6 (six) hours as needed for wheezing or shortness of breath. 12/18/18   Kuneff, Renee A, DO  aspirin EC 81 MG EC tablet Take 1 tablet (81 mg total) by mouth daily. 11/03/17   Robbie Lis M, PA-C  atorvastatin (LIPITOR) 80 MG tablet TAKE 1 TABLET (80 MG TOTAL) BY MOUTH DAILY AT 6 PM. 07/08/19   [place], Peter M, MD  azelastine (OPTIVAR) 0.05 % ophthalmic solution azelastine 0.05 % eye drops    [provider]  budesonide-formoterol (SYMBICORT) 160-4.5 MCG/ACT inhaler Inhale 2 puffs into the lungs 2 (two) times daily. 12/18/18   Kuneff, Renee A, DO  candesartan (ATACAND) 4 MG tablet TAKE 1 TABLET BY MOUTH EVERY DAY 12/16/19   [place],  Demetria Pore, MD  cholecalciferol (VITAMIN D) 1000 units tablet Take 1,000 Units by mouth 2 (two) times daily.    [provider]  diclofenac sodium (VOLTAREN) 1 % GEL Apply 2-4 g topically daily as needed (as directed to painful sites).     [provider]  erythromycin ophthalmic ointment Place 1 application into the right eye 4 (four) times daily. For 5-7 days 12/25/19   Terressa Koyanagi, DO  levalbuterol (XOPENEX) 1.25 MG/3ML nebulizer solution levalbuterol 1.25 mg/3 mL solution for nebulization  USE 1 VIAL VIA NEBULIZER 3 TIMES A DAY    [provider]  levocetirizine (XYZAL) 5 MG tablet Take 1 tablet (5 mg total) by mouth every evening. 12/11/19   Kuneff, Renee A, DO  methocarbamol (ROBAXIN) 500 MG tablet Take 1 tablet (500 mg total) by mouth at bedtime as needed for muscle spasms. 01/03/20   Caccavale, Sophia, PA-C  metoprolol succinate (TOPROL-XL) 50 MG 24 hr tablet TAKE 1 TABLET BY MOUTH EVERY DAY WITH OR IMMEDIATELY FOLLOWING A MEAL 07/09/19   [place], Peter M, MD  mometasone (NASONEX) 50 MCG/ACT nasal spray Place 2 sprays into the nose every morning. 12/18/18   Kuneff, Renee A, DO  montelukast (SINGULAIR) 10 MG tablet Take 1 tablet (10 mg total) by mouth at bedtime. 12/11/19   Kuneff, Renee A, DO  Multiple Vitamin (MULTIVITAMIN) tablet Take 1 tablet by mouth daily.    [provider]  nitroGLYCERIN (NITROSTAT) 0.4 MG SL tablet Place 1 tablet (0.4 mg total) under the tongue every 5 (five) minutes as needed for  chest pain. 11/02/17 11/02/18  Allayne Butcher, PA-C    Allergies    Compazine [prochlorperazine edisylate], Advil [ibuprofen], and Aspirin  Review of Systems   Review of Systems  Constitutional: Negative for chills, diaphoresis, fatigue and fever.  HENT: Negative for congestion, sore throat and trouble swallowing.   Eyes: Negative for pain and visual disturbance.  Respiratory: Negative for cough, chest tightness, shortness of breath and wheezing.   Cardiovascular: Positive for chest pain. Negative for palpitations and leg swelling.  Gastrointestinal: Negative for abdominal distention, abdominal pain, diarrhea, nausea and vomiting.  Genitourinary: Negative for difficulty urinating.  Musculoskeletal: Negative for back pain, neck pain and neck stiffness.  Skin: Negative for pallor.  Neurological: Negative for dizziness, speech difficulty, weakness and headaches.  Psychiatric/Behavioral: Negative for confusion.    Physical Exam Updated Vital Signs BP (!) 136/95 (BP Location: Right Arm)   Pulse 64   Temp 98.1 F (36.7 C) (Oral)   Resp 18   Ht 5\' 9"  (1.753 m)   Wt 108.4 kg   SpO2 100%   BMI 35.29 kg/m   Physical Exam Constitutional:      General: She is not in acute distress.    Appearance: Normal appearance. She is not ill-appearing, toxic-appearing or diaphoretic.  HENT:     Head: Normocephalic.     Mouth/Throat:     Mouth: Mucous membranes are moist.     Pharynx: Oropharynx is clear.  Eyes:     General: No scleral icterus.    Extraocular Movements: Extraocular movements intact.     Pupils: Pupils are equal, round, and reactive to light.  Cardiovascular:     Rate and Rhythm: Normal rate and regular rhythm.     Pulses: Normal pulses.     Heart sounds: Normal heart sounds. No murmur heard.  No friction rub.  Pulmonary:     Effort: Pulmonary effort is normal. No respiratory distress.  Breath sounds: Normal breath sounds. No stridor. No wheezing, rhonchi or rales.   Chest:     Chest wall: No tenderness.  Abdominal:     General: Abdomen is flat. There is no distension.     Palpations: Abdomen is soft.     Tenderness: There is no abdominal tenderness. There is no guarding or rebound.  Musculoskeletal:        General: No swelling or tenderness. Normal range of motion.     Cervical back: Normal range of motion and neck supple. No rigidity.     Right lower leg: No edema.     Left lower leg: No edema.  Skin:    General: Skin is warm and dry.     Capillary Refill: Capillary refill takes less than 2 seconds.     Coloration: Skin is not pale.  Neurological:     General: No focal deficit present.     Mental Status: She is alert and oriented to person, place, and time. Mental status is at baseline.     Cranial Nerves: No cranial nerve deficit.     Sensory: No sensory deficit.     Motor: No weakness.  Psychiatric:        Mood and Affect: Mood normal.        Behavior: Behavior normal.     ED Results / Procedures / Treatments   Labs (all labs ordered are listed, but only abnormal results are displayed) Labs Reviewed  COMPREHENSIVE METABOLIC PANEL - Abnormal; Notable for the following components:      Result Value   CO2 21 (*)    Total Protein 5.8 (*)    Albumin 3.2 (*)    All other components within normal limits  CBC  TROPONIN I (HIGH SENSITIVITY)  TROPONIN I (HIGH SENSITIVITY)    EKG EKG Interpretation  Date/Time:  Monday January 19 2020 13:18:12 EDT Ventricular Rate:  61 PR Interval:    QRS Duration: 99 QT Interval:  392 QTC Calculation: 395 R Axis:   48 Text Interpretation: Sinus rhythm Low voltage, precordial leads Abnormal R-wave progression, early transition ST elevation, consider inferior injury Since last tracing subtle inferior ST elevation is now present Confirmed by Mancel BaleWentz, Elliott [phone]([id number]) on 01/19/2020 1:21:39 PM   Radiology DG Chest Port 1 View  Result Date: 01/19/2020 CLINICAL DATA:  Chest pain EXAM: PORTABLE CHEST 1  VIEW COMPARISON:  05/22/2018 FINDINGS: The heart size and mediastinal contours are within normal limits. Both lungs are clear. The visualized skeletal structures are unremarkable. IMPRESSION: No active disease. Electronically Signed   By: Elige KoHetal  Chaelyn Bunyan   On: 01/19/2020 13:40    Procedures Procedures (including critical care time)  Medications Ordered in ED Medications - No data to display  ED Course  I have reviewed the triage vital signs and the nursing notes.  Pertinent labs & imaging results that were available during my care of the patient were reviewed by me and considered in my medical decision making (see chart for details).    MDM Rules/Calculators/A&P                         Alyssa Phillips is a 64 y.o. female (past medical history of CAD, STEMI s/p LAD treated with DES x1 in 2019, asthma, hyperlipidemia, hypertension presents emerged department today for chest pain. EKG without any signs of ischemia, first troponin 2, second troponin 2. CBC and CMP reassuring.   HEAR score 4 which suggests observation vs  oupatient follow up.  Shared decision making about this, patient states that she does not want to come into therapy, would rather follow-up with cardiologist.  I am okay with this, Dr. Effie Shy thinks that this pain is musculoskeletal in nature and is ok if she goes home.  Has been chest pain-free for 4-1/2 hours while being in the ER.  Patient is to be discharged with recommendation to follow up with cardiology in regards to today's hospital visit. Chest pain is not likely of cardiac or pulmonary etiology d/t presentation, PERC negative, VSS, no tracheal deviation, no JVD or new murmur, RRR, breath sounds equal bilaterally, EKG without acute abnormalities, negative troponin, and negative CXR. Pt has been advised to return to the ED if CP becomes exertional, associated with diaphoresis or nausea, radiates to left jaw/arm, worsens or becomes concerning in any way. Pt appears reliable for  follow up and is agreeable to discharge.   I discussed this case with my attending physician, Dr. Azell Der who cosigned this note including patient's presenting symptoms, physical exam, and planned diagnostics and interventions. Attending physician stated agreement with plan or made changes to plan which were implemented. Attending physician assessed patient at bedside.  Final Clinical Impression(s) / ED Diagnoses Final diagnoses:  Chest pain, unspecified type    Rx / DC Orders ED Discharge Orders    None       Farrel Gordon, PA-C 01/19/20 1704    Mancel Bale, MD 01/20/20 [phone]

## 2020-01-19 NOTE — ED Triage Notes (Signed)
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 States she took asa x 4 and Ntg x 1 with relieve. Denies chest pain or sob upon arrival . Ambulates with difficulty.

## 2020-01-19 NOTE — Discharge Instructions (Signed)
Your work-up today was reassuring, as we discussed I want you to follow-up with your cardiologist.  Please come back to the emergency department if your chest pain continues, worsens, is worse with exertion, you have shortness of breath, weakness or numbness and tingling as we discussed.  I want you to take it easy over the next couple of days, your chest pain could most likely be from musculoskeletal causes as we discussed.  Please use the attached instructions.  Continue to take a baby aspirin daily, please take nitro if you feel it is necessary again.  You can use Tylenol as prescribed on the bottle for pain. I hope you feel better!

## 2020-01-19 NOTE — ED Provider Notes (Signed)
°  Face-to-face evaluation   History: She reports that she had chest discomfort after lifting a box above her shoulders, to put on a shelf.  She feels like this aggravated her shoulder pain, in a few minutes later resulted in "sharp chest pain," in the center of her chest, which lasted 15 minutes, and resolved after taking nitroglycerin and aspirin.  She has not recently used nitroglycerin.  She has known coronary disease.  No other recent illnesses.  Physical exam: Alert, calm, cooperative.  Heart regular rate and rhythm without murmur.  Lungs clear to auscultation.  Chest nontender to palpation.  Abdomen soft nontender.  Medical screening examination/treatment/procedure(s) were conducted as a shared visit with non-physician practitioner(s) and myself.  I personally evaluated the patient during the encounter    Mancel Bale, MD 01/20/20 [phone]

## 2020-02-09 DIAGNOSIS — M25512 Pain in left shoulder: Secondary | ICD-10-CM | POA: Diagnosis not present

## 2020-02-09 DIAGNOSIS — M25511 Pain in right shoulder: Secondary | ICD-10-CM | POA: Diagnosis not present

## 2020-02-12 DIAGNOSIS — M25512 Pain in left shoulder: Secondary | ICD-10-CM | POA: Diagnosis not present

## 2020-02-12 DIAGNOSIS — M25511 Pain in right shoulder: Secondary | ICD-10-CM | POA: Diagnosis not present

## 2020-02-19 DIAGNOSIS — G5601 Carpal tunnel syndrome, right upper limb: Secondary | ICD-10-CM | POA: Insufficient documentation

## 2020-02-19 DIAGNOSIS — G5602 Carpal tunnel syndrome, left upper limb: Secondary | ICD-10-CM | POA: Insufficient documentation

## 2020-02-19 DIAGNOSIS — G5603 Carpal tunnel syndrome, bilateral upper limbs: Secondary | ICD-10-CM | POA: Diagnosis not present

## 2020-02-19 HISTORY — DX: Carpal tunnel syndrome, left upper limb: G56.02

## 2020-02-19 HISTORY — DX: Carpal tunnel syndrome, right upper limb: G56.01

## 2020-02-23 ENCOUNTER — Telehealth: Payer: Self-pay

## 2020-02-23 NOTE — Telephone Encounter (Signed)
Pt will CB to discuss receiving steroid inj before shingles.   Note: it is okay to receive both inj

## 2020-02-23 NOTE — Telephone Encounter (Signed)
Having a deep tissue shot on 02/26/20 for reaction to shingles vaccine.  Patient has questions about 2nd shingles vaccine appt that is scheduled on 11/29.  Please call patient at [phone]

## 2020-02-24 NOTE — Telephone Encounter (Signed)
Pt is aware it is okay to rec both shots. Pt was told that a shot caused her to have frozen shoulder. Pt was inform that the size needle used here in office for an IM inj couldn't possible reach inside the shoulder joint as well as the fact the shot is given in the Deltoid muscle. Pt stated she was going to confirm with her ortho, Dr. Melrose Nakayama, to ensure it was true. Pt was encourage to do so.   FYI

## 2020-02-26 DIAGNOSIS — M25511 Pain in right shoulder: Secondary | ICD-10-CM | POA: Diagnosis not present

## 2020-03-04 DIAGNOSIS — G5602 Carpal tunnel syndrome, left upper limb: Secondary | ICD-10-CM | POA: Diagnosis not present

## 2020-03-04 DIAGNOSIS — M7501 Adhesive capsulitis of right shoulder: Secondary | ICD-10-CM | POA: Diagnosis not present

## 2020-03-04 DIAGNOSIS — G5601 Carpal tunnel syndrome, right upper limb: Secondary | ICD-10-CM | POA: Diagnosis not present

## 2020-03-04 DIAGNOSIS — S46002S Unspecified injury of muscle(s) and tendon(s) of the rotator cuff of left shoulder, sequela: Secondary | ICD-10-CM | POA: Diagnosis not present

## 2020-03-09 DIAGNOSIS — M25512 Pain in left shoulder: Secondary | ICD-10-CM | POA: Diagnosis not present

## 2020-03-09 DIAGNOSIS — M25511 Pain in right shoulder: Secondary | ICD-10-CM | POA: Diagnosis not present

## 2020-03-15 ENCOUNTER — Telehealth: Payer: Self-pay

## 2020-03-15 ENCOUNTER — Ambulatory Visit: Payer: BC Managed Care – PPO

## 2020-03-15 DIAGNOSIS — S46002S Unspecified injury of muscle(s) and tendon(s) of the rotator cuff of left shoulder, sequela: Secondary | ICD-10-CM | POA: Diagnosis not present

## 2020-03-15 DIAGNOSIS — M7501 Adhesive capsulitis of right shoulder: Secondary | ICD-10-CM | POA: Diagnosis not present

## 2020-03-15 NOTE — Telephone Encounter (Signed)
Appt was cancelled  Client [facility] Night - Client Client Site [facility] Night Physician Claiborne [place], [place] Contact Type Call Who Is Calling Patient / Member / Family / Caregiver Caller Name Alyssa Phillips Phone Number [phone] Patient Name same Patient DOB 05-29-1955 Call Type Message Only Information Provided Reason for Call Request to [facility] Appointment Initial Comment Caller needs to CANCEL her appointment for tomorrow 03/15/2020. Caller has PT and is unable to come to appointment. Additional Comment Caller needs to CANCEL her appointment for 03/15/2020. Disp. Time Disposition Final User 03/14/2020 6:48:33 PM General Information Provided Yes King-Hussey, Berdi Call Closed By: Vivianne Master Transaction Date/Time: 03/14/2020 6:44:48 PM (ET)

## 2020-03-17 DIAGNOSIS — S46002S Unspecified injury of muscle(s) and tendon(s) of the rotator cuff of left shoulder, sequela: Secondary | ICD-10-CM | POA: Diagnosis not present

## 2020-03-17 DIAGNOSIS — M7501 Adhesive capsulitis of right shoulder: Secondary | ICD-10-CM | POA: Diagnosis not present

## 2020-03-19 DIAGNOSIS — R42 Dizziness and giddiness: Secondary | ICD-10-CM | POA: Diagnosis not present

## 2020-03-22 DIAGNOSIS — M7501 Adhesive capsulitis of right shoulder: Secondary | ICD-10-CM | POA: Diagnosis not present

## 2020-03-22 DIAGNOSIS — S46002S Unspecified injury of muscle(s) and tendon(s) of the rotator cuff of left shoulder, sequela: Secondary | ICD-10-CM | POA: Diagnosis not present

## 2020-03-24 DIAGNOSIS — S46002S Unspecified injury of muscle(s) and tendon(s) of the rotator cuff of left shoulder, sequela: Secondary | ICD-10-CM | POA: Diagnosis not present

## 2020-03-24 DIAGNOSIS — M7501 Adhesive capsulitis of right shoulder: Secondary | ICD-10-CM | POA: Diagnosis not present

## 2020-03-30 DIAGNOSIS — M7501 Adhesive capsulitis of right shoulder: Secondary | ICD-10-CM | POA: Diagnosis not present

## 2020-03-30 DIAGNOSIS — S46002S Unspecified injury of muscle(s) and tendon(s) of the rotator cuff of left shoulder, sequela: Secondary | ICD-10-CM | POA: Diagnosis not present

## 2020-04-01 DIAGNOSIS — G5603 Carpal tunnel syndrome, bilateral upper limbs: Secondary | ICD-10-CM | POA: Diagnosis not present

## 2020-04-03 DIAGNOSIS — G5601 Carpal tunnel syndrome, right upper limb: Secondary | ICD-10-CM | POA: Diagnosis not present

## 2020-04-03 DIAGNOSIS — R200 Anesthesia of skin: Secondary | ICD-10-CM | POA: Diagnosis not present

## 2020-04-03 DIAGNOSIS — G5622 Lesion of ulnar nerve, left upper limb: Secondary | ICD-10-CM | POA: Diagnosis not present

## 2020-04-05 DIAGNOSIS — M7501 Adhesive capsulitis of right shoulder: Secondary | ICD-10-CM | POA: Diagnosis not present

## 2020-04-05 DIAGNOSIS — S46002S Unspecified injury of muscle(s) and tendon(s) of the rotator cuff of left shoulder, sequela: Secondary | ICD-10-CM | POA: Diagnosis not present

## 2020-04-07 DIAGNOSIS — M7501 Adhesive capsulitis of right shoulder: Secondary | ICD-10-CM | POA: Diagnosis not present

## 2020-04-07 DIAGNOSIS — S46002S Unspecified injury of muscle(s) and tendon(s) of the rotator cuff of left shoulder, sequela: Secondary | ICD-10-CM | POA: Diagnosis not present

## 2020-04-30 DIAGNOSIS — G5603 Carpal tunnel syndrome, bilateral upper limbs: Secondary | ICD-10-CM | POA: Diagnosis not present

## 2020-05-14 ENCOUNTER — Other Ambulatory Visit: Payer: Self-pay

## 2020-05-14 ENCOUNTER — Ambulatory Visit: Payer: BC Managed Care – PPO | Admitting: Family Medicine

## 2020-05-14 ENCOUNTER — Encounter: Payer: Self-pay | Admitting: Family Medicine

## 2020-05-14 VITALS — BP 110/75 | HR 60 | Temp 98.3°F | Ht 69.0 in | Wt 244.0 lb

## 2020-05-14 DIAGNOSIS — R110 Nausea: Secondary | ICD-10-CM | POA: Insufficient documentation

## 2020-05-14 DIAGNOSIS — E213 Hyperparathyroidism, unspecified: Secondary | ICD-10-CM | POA: Diagnosis not present

## 2020-05-14 DIAGNOSIS — R42 Dizziness and giddiness: Secondary | ICD-10-CM | POA: Insufficient documentation

## 2020-05-14 DIAGNOSIS — R270 Ataxia, unspecified: Secondary | ICD-10-CM | POA: Insufficient documentation

## 2020-05-14 LAB — POC URINALSYSI DIPSTICK (AUTOMATED)
Bilirubin, UA: NEGATIVE
Blood, UA: 10
Glucose, UA: NEGATIVE
Ketones, UA: NEGATIVE
Leukocytes, UA: NEGATIVE
Nitrite, UA: NEGATIVE
Protein, UA: POSITIVE — AB
Spec Grav, UA: 1.015 (ref 1.010–1.025)
Urobilinogen, UA: 0.2 E.U./dL
pH, UA: 7 (ref 5.0–8.0)

## 2020-05-14 MED ORDER — MECLIZINE HCL 25 MG PO TABS: 25.0000 mg | ORAL_TABLET | Freq: Three times a day (TID) | ORAL | 0 refills | Status: DC | PRN

## 2020-05-14 MED ORDER — ONDANSETRON HCL 4 MG PO TABS: 4.0000 mg | ORAL_TABLET | Freq: Three times a day (TID) | ORAL | 1 refills | Status: DC | PRN

## 2020-05-14 MED ORDER — ONDANSETRON HCL 4 MG PO TABS: 4.0000 mg | ORAL_TABLET | Freq: Three times a day (TID) | ORAL | 0 refills | Status: DC | PRN

## 2020-05-14 NOTE — Progress Notes (Signed)
This visit occurred during the SARS-CoV-2 public health emergency.  Safety protocols were in place, including screening questions prior to the visit, additional usage of staff PPE, and extensive cleaning of exam room while observing appropriate contact time as indicated for disinfecting solutions.    Alyssa Phillips , February 20, 1956, 65 y.o., female MRN: [id number] Patient Care Team    Relationship Specialty Notifications Start End  Ma Hillock, DO PCP - General Family Medicine  12/04/17   Teena Irani, MD (Inactive) Consulting Physician Gastroenterology  12/04/17   Gaynelle Arabian, MD Consulting Physician Orthopedic Surgery  12/04/17   Mosetta Anis, MD Referring Physician Allergy  12/12/19   Martinique, Peter M, MD Consulting Physician Cardiology  12/12/19   Shamleffer, Melanie Crazier, MD Consulting Physician Endocrinology  12/12/19     Chief Complaint  Patient presents with  . Dizziness    Pt c/o dizziness and nausea x 2 days; was dx with vertigo 2 mos ago     Subjective: Pt presents for an OV with complaints of dizziness/spinning sensation of 2 days duration.  Associated symptoms include unsteady gait, needing to hold onto the wall in order not to fall. Patient reports she woke up in the middle of the night 2 days ago at 2:30 AM to use the bathroom and she reports the room was spinning and has been since that time. She reports her head will feel very heavy. Movement of her head seems to make symptoms worse. She does endorse ringing in her ears during this occasion when symptoms started, not as much currently. She denies fever, chills, visual changes, headache or upper respiratory symptoms. She reports she had vertigo a few years ago and was provided with meclizine and Zofran. She has been taking both of these which helps a small fraction with the symptoms, but symptoms are still occurring. She reports last time she had vertigo symptoms she laid down and slept and when she awoke the symptoms  were resolved. They did not last longer than a few hours. Has a personal history of STEMI, hyperlipidemia, hypertension, asthma, hypercalcemia, hyperparathyroidism. SHe reports compliance with cardiac meds, Lipitor and baby aspirin daily. She has a family history of lymphoma and stroke.  Depression screen [facility] 2/9 12/11/2019 12/09/2018 04/08/2018 01/07/2018 12/04/2017  Decreased Interest 0 0 0 0 0  Down, Depressed, Hopeless 0 0 0 0 0  PHQ - 2 Score 0 0 0 0 0  Altered sleeping 0 - - - -  Tired, decreased energy 0 - - - -  Change in appetite 0 - - - -  Feeling bad or failure about yourself  0 - - - -  Trouble concentrating 0 - - - -  Moving slowly or fidgety/restless 0 - - - -  Suicidal thoughts 0 - - - -  PHQ-9 Score 0 - - - -    Allergies  Allergen Reactions  . Compazine [Prochlorperazine Edisylate] Other (See Comments)    Eyes rolled back, tongue curled up  . Advil [Ibuprofen] Hives  . Aspirin Other (See Comments)    HAS to be EC aspirin!!   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 Allergic rhinitis   .  Arthritis    Osteoarthrits-knees-hx. RTKA  . Asthma    environmental agents induced asthma  . Coronary artery disease   . Family history of adverse reaction to anesthesia    daughter very sensitive to anesthesia  . GERD (gastroesophageal reflux disease)   . Heart murmur    hx. mitral valve proplapse-mostly asymptomatic-->not apprectiaed n echo  . Hepatic steatosis 03/05/2008   [place]  . Hyperlipidemia   . Hypertension   . Morbid obesity due to excess calories ([place])   . STEMI (ST elevation myocardial infarction) ([place]) 10/2017   LAD treated with DESx1  . UTI (urinary tract infection)   . Vitamin D deficiency    Past Surgical  History:  Procedure Laterality Date  . 5th finger surgery Right 1990   unknown injury  . CARDIAC CATHETERIZATION    . CHOLECYSTECTOMY    . COLONOSCOPY WITH PROPOFOL N/A 06/08/2016   Procedure: COLONOSCOPY WITH PROPOFOL;  Surgeon: Teena Irani, MD;  Location: [place];  Service: Endoscopy;  Laterality: N/A;  . CORONARY STENT INTERVENTION N/A 11/01/2017   Procedure: CORONARY STENT INTERVENTION;  Surgeon: Martinique, Peter M, MD;  Location: [place] [facility];  Service: Cardiovascular;  Laterality: N/A;  Resolute Onyx 4.5 mm x12 mm  . CORONARY/GRAFT ACUTE MI REVASCULARIZATION N/A 11/01/2017   Procedure: Coronary/Graft Acute MI Revascularization;  Surgeon: Martinique, Peter M, MD;  Location: [place] [facility];  Service: Cardiovascular;  Laterality: N/A;  . KNEE ARTHROSCOPY  05-02-12   x2 right/ x1 left  . LEFT HEART CATH AND CORONARY ANGIOGRAPHY N/A 11/01/2017   Procedure: LEFT HEART CATH AND CORONARY ANGIOGRAPHY;  Surgeon: Martinique, Peter M, MD;  Location: [place] [facility];  Service: Cardiovascular;  Laterality: N/A;  . REPLACEMENT TOTAL KNEE Right 10/2010  . TOTAL KNEE ARTHROPLASTY  05/10/2012   Procedure: TOTAL KNEE ARTHROPLASTY;  Surgeon: Gearlean Alf, MD;  Location: [facility];  Service: Orthopedics;  Laterality: Left;  . TUBAL LIGATION    . UMBILICAL HERNIA REPAIR     Family History  Problem Relation Age of Onset  . Hypertension Mother   . Arthritis Mother   . Lymphoma Mother   . Asthma Mother   . Cancer Mother   . Hearing loss Mother   . Miscarriages / [place] Mother   . COPD Father   . Alcohol abuse Father   . Arthritis Father   . Hearing loss Father   . Hypertension Father   . Miscarriages / Stillbirths Sister   . Arthritis Maternal Grandmother   . Hearing loss Maternal Grandmother   . Breast cancer Maternal Grandmother 71  . Arthritis Maternal Grandfather   . Diabetes Maternal Grandfather   . Hypertension Maternal Grandfather   . Stroke Maternal Grandfather   . Arthritis  Paternal Grandmother   . Diabetes Paternal Grandmother   . Hearing loss Paternal Grandmother   . Hearing loss Paternal Grandfather   . Learning disabilities Paternal Grandfather    Allergies as of 05/14/2020      Reactions   Compazine [prochlorperazine Edisylate] Other (See Comments)   Eyes rolled back, tongue curled up   Advil [ibuprofen] Hives   Aspirin Other (See Comments)   HAS to be EC aspirin!!      Medication List       Accurate as of May 14, 2020 11:59 PM. If you have any questions, ask your nurse or doctor.        albuterol 108 (90 Base) MCG/ACT inhaler Commonly known as: VENTOLIN HFA Inhale  1-2 puffs into the lungs every 6 (six) hours as needed for wheezing or shortness of breath.   aspirin 81 MG EC tablet Take 1 tablet (81 mg total) by mouth daily.   atorvastatin 80 MG tablet Commonly known as: LIPITOR TAKE 1 TABLET (80 MG TOTAL) BY MOUTH DAILY AT 6 PM.   azelastine 0.05 % ophthalmic solution Commonly known as: OPTIVAR azelastine 0.05 % eye drops   budesonide-formoterol 160-4.5 MCG/ACT inhaler Commonly known as: SYMBICORT Inhale 2 puffs into the lungs 2 (two) times daily.   candesartan 4 MG tablet Commonly known as: ATACAND TAKE 1 TABLET BY MOUTH EVERY DAY   cholecalciferol 1000 units tablet Commonly known as: VITAMIN D Take 1,000 Units by mouth 2 (two) times daily.   diclofenac sodium 1 % Gel Commonly known as: VOLTAREN Apply 2-4 g topically daily as needed (as directed to painful sites).   erythromycin ophthalmic ointment Place 1 application into the right eye 4 (four) times daily. For 5-7 days   HYDROcodone-acetaminophen 5-325 MG tablet Commonly known as: NORCO/VICODIN Take 1 tablet by mouth every 4 (four) hours as needed.   levalbuterol 1.25 MG/3ML nebulizer solution Commonly known as: XOPENEX levalbuterol 1.25 mg/3 mL solution for nebulization  USE 1 VIAL VIA NEBULIZER 3 TIMES A DAY   levocetirizine 5 MG tablet Commonly known as:  XYZAL Take 1 tablet (5 mg total) by mouth every evening.   meclizine 25 MG tablet Commonly known as: ANTIVERT Take 1 tablet (25 mg total) by mouth 3 (three) times daily as needed for dizziness. What changed:   how much to take  when to take this  reasons to take this Changed by: Howard Pouch, DO   methocarbamol 500 MG tablet Commonly known as: ROBAXIN Take 1 tablet (500 mg total) by mouth at bedtime as needed for muscle spasms.   metoprolol succinate 50 MG 24 hr tablet Commonly known as: TOPROL-XL TAKE 1 TABLET BY MOUTH EVERY DAY WITH OR IMMEDIATELY FOLLOWING A MEAL   mometasone 50 MCG/ACT nasal spray Commonly known as: NASONEX Place 2 sprays into the nose every morning.   montelukast 10 MG tablet Commonly known as: SINGULAIR Take 1 tablet (10 mg total) by mouth at bedtime.   multivitamin tablet Take 1 tablet by mouth daily.   nitroGLYCERIN 0.4 MG SL tablet Commonly known as: Nitrostat Place 1 tablet (0.4 mg total) under the tongue every 5 (five) minutes as needed for chest pain.   ondansetron 4 MG tablet Commonly known as: ZOFRAN Take 1 tablet (4 mg total) by mouth every 8 (eight) hours as needed for nausea or vomiting. What changed: See the new instructions. Changed by: Howard Pouch, DO   predniSONE 5 MG (21) Tbpk tablet Commonly known as: STERAPRED UNI-PAK 21 TAB Take by mouth.   traMADol 50 MG tablet Commonly known as: ULTRAM Take 50 mg by mouth every 6 (six) hours as needed.       All past medical history, surgical history, allergies, family history, immunizations andmedications were updated in the EMR today and reviewed under the history and medication portions of their EMR.     ROS: Negative, with the exception of above mentioned in HPI   Objective:  BP 110/75   Pulse 60   Temp 98.3 F (36.8 C) (Oral)   Ht 5' 9" (1.753 m)   Wt 244 lb (110.7 kg)   SpO2 97%   BMI 36.03 kg/m  Body mass index is 36.03 kg/m. Gen: Afebrile. No acute distress.  Nontoxic in  appearance, well developed, well nourished.  HENT: AT. [place]. Bilateral TM visualized without erythema or effusions. MMM, no oral lesions. Bilateral nares without erythema. Throat without erythema or exudates. No cough. No hoarseness. Eyes:Pupils Equal Round Reactive to light, Extraocular movements intact,  Conjunctiva without redness, discharge or icterus. Neck/lymp/endocrine: Supple, no lymphadenopathy CV: RRR, no edema Chest: CTAB, no wheeze or crackles. Good air movement, normal resp effort.  Skin: no rashes, purpura or petechiae.  Neuro: Ambulatory-with assistance on holding wall and walking very slowly. PERLA. EOMi. Alert. Oriented x3 Cranial nerves II through XII intact. Muscle strength 5/5 bilateral extremity. DTRs equal bilaterally.  -Positive Dix-Hallpike test with right head turn-recurrent symptoms. Psych: Normal affect, dress and demeanor. Normal speech. Normal thought content and judgment.  No exam data present No results found. No results found for this or any previous visit (from the past 24 hour(s)).  Assessment/Plan: Alyssa Phillips is a 65 y.o. female present for OV for  Dizziness/nausea ataxia Discussed different etiologies possible for dizziness and vertigo. It does sound more vertigo in nature although cannot rule out vascular/neuro causes given ataxia and symptoms lasting rater than 48 hours. Possibly secondary to her hyperparathyroidism or hypercalcemia we will check labs today to rule out. - POCT Urinalysis Dipstick (Automated) - Comp Met (CMET) - PTH, Intact and Calcium - CBC w/Diff - Urinalysis w microscopic + reflex cultur - meclizine and Zofran prescribed. - MR Brain W Wo Contrast; Future Follow-up dependent upon laboratory results and MRI results. Patient is aware if symptoms are worsening she needs to be seen in the emergency room immediately to rule out neurological causes. She reports understanding.   Reviewed expectations re: course of current  medical issues.  Discussed self-management of symptoms.  Outlined signs and symptoms indicating need for more acute intervention.  Patient verbalized understanding and all questions were answered.  Patient received an After-Visit Summary.    Orders Placed This Encounter  Procedures  . Urine Culture  . MR Brain W Wo Contrast  . Comp Met (CMET)  . PTH, Intact and Calcium  . CBC w/Diff  . Urinalysis w microscopic + reflex cultur  . REFLEXIVE URINE CULTURE  . POCT Urinalysis Dipstick (Automated)   Meds ordered this encounter  Medications  . meclizine (ANTIVERT) 25 MG tablet    Sig: Take 1 tablet (25 mg total) by mouth 3 (three) times daily as needed for dizziness.    Dispense:  90 tablet    Refill:  0  . DISCONTD: ondansetron (ZOFRAN) 4 MG tablet    Sig: Take 1 tablet (4 mg total) by mouth every 8 (eight) hours as needed for nausea or vomiting.    Dispense:  20 tablet    Refill:  0  . ondansetron (ZOFRAN) 4 MG tablet    Sig: Take 1 tablet (4 mg total) by mouth every 8 (eight) hours as needed for nausea or vomiting.    Dispense:  30 tablet    Refill:  1   Referral Orders  No referral(s) requested today     Note is dictated utilizing voice recognition software. Although note has been proof read prior to signing, occasional typographical errors still can be missed. If any questions arise, please do not hesitate to call for verification.   electronically signed by:  Howard Pouch, DO  [place]

## 2020-05-14 NOTE — Patient Instructions (Addendum)
How to Perform the Epley Maneuver The Epley maneuver is an exercise that relieves symptoms of vertigo. Vertigo is the feeling that you or your surroundings are moving when they are not. When you feel vertigo, you may feel like the room is spinning and may have trouble walking. The Epley maneuver is used for a type of vertigo caused by a calcium deposit in a part of the inner ear. The maneuver involves changing head positions to help the deposit move out of the area. You can do this maneuver at home whenever you have symptoms of vertigo. You can repeat it in 24 hours if your vertigo has not gone away. Even though the Epley maneuver may relieve your vertigo for a few weeks, it is possible that your symptoms will return. This maneuver relieves vertigo, but it does not relieve dizziness. What are the risks? If it is done correctly, the Epley maneuver is considered safe. Sometimes it can lead to dizziness or nausea that goes away after a short time. If you develop other symptoms-such as changes in vision, weakness, or numbness-stop doing the maneuver and call your health care provider. Supplies needed:  A bed or table.  A pillow. How to do the Epley maneuver 1. Sit on the edge of a bed or table with your back straight and your legs extended or hanging over the edge of the bed or table. 2. Turn your head halfway toward the affected ear or side as told by your health care provider. 3. Lie backward quickly with your head turned until you are lying flat on your back. You may want to position a pillow under your shoulders. 4. Hold this position for at least 30 seconds. If you feel dizzy or have symptoms of vertigo, continue to hold the position until the symptoms stop. 5. Turn your head to the opposite direction until your unaffected ear is facing the floor. 6. Hold this position for at least 30 seconds. If you feel dizzy or have symptoms of vertigo, continue to hold the position until the symptoms  stop. 7. Turn your whole body to the same side as your head so that you are positioned on your side. Your head will now be nearly facedown. Hold for at least 30 seconds. If you feel dizzy or have symptoms of vertigo, continue to hold the position until the symptoms stop. 8. Sit back up. You can repeat the maneuver in 24 hours if your vertigo does not go away.      Follow these instructions at home: For 24 hours after doing the Epley maneuver:  Keep your head in an upright position.  When lying down to sleep or rest, keep your head raised (elevated) with two or more pillows.  Avoid excessive neck movements. Activity  Do not drive or use machinery if you feel dizzy.  After doing the Epley maneuver, return to your normal activities as told by your health care provider. Ask your health care provider what activities are safe for you. General instructions  Drink enough fluid to keep your urine pale yellow.  Do not drink alcohol.  Take over-the-counter and prescription medicines only as told by your health care provider.  Keep all follow-up visits as told by your health care provider. This is important. Preventing vertigo symptoms Ask your health care provider if there is anything you should do at home to prevent vertigo. He or she may recommend that you:  Keep your head elevated with two or more pillows while you sleep.  Do not sleep on the side of your affected ear.  Get up slowly from bed.  Avoid sudden movements during the day.  Avoid extreme head positions or movement, such as looking up or bending over. Contact a health care provider if:  Your vertigo gets worse.  You have other symptoms, including: ? Nausea. ? Vomiting. ? Headache. Get help right away if you:  Have vision changes.  Have a headache or neck pain that is severe or getting worse.  Cannot stop vomiting.  Have new numbness or weakness in any part of your body. Summary  Vertigo is the feeling that  you or your surroundings are moving when they are not.  The Epley maneuver is an exercise that relieves symptoms of vertigo.  If the Epley maneuver is done correctly, it is considered safe and relieves vertigo quickly. This information is not intended to replace advice given to you by your health care provider. Make sure you discuss any questions you have with your health care provider. Document Revised: 01/29/2019 Document Reviewed: 01/29/2019 Elsevier Patient Education  2021 Elsevier Inc.  Vertigo Vertigo is the feeling that you or the things around you are moving when they are not. This feeling can come and go at any time. Vertigo often goes away on its own. This condition can be dangerous if it happens when you are doing activities like driving or working with machines. Your doctor will do tests to find the cause of your vertigo. These tests will also help your doctor decide on the best treatment for you. Follow these instructions at home: Eating and drinking  Drink enough fluid to keep your pee (urine) pale yellow.  Do not drink alcohol.      Activity  Return to your normal activities as told by your doctor. Ask your doctor what activities are safe for you.  In the morning, first sit up on the side of the bed. When you feel okay, stand slowly while you hold onto something until you know that your balance is fine.  Move slowly. Avoid sudden body or head movements or certain positions, as told by your doctor.  Use a cane if you have trouble standing or walking.  Sit down right away if you feel dizzy.  Avoid doing any tasks or activities that can cause danger to you or others if you get dizzy.  Avoid bending down if you feel dizzy. Place items in your home so that they are easy for you to reach without leaning over.  Do not drive or use heavy machinery if you feel dizzy. General instructions  Take over-the-counter and prescription medicines only as told by your  doctor.  Keep all follow-up visits as told by your doctor. This is important. Contact a doctor if:  Your medicine does not help your vertigo.  You have a fever.  Your problems get worse or you have new symptoms.  Your family or friends see changes in your behavior.  The feeling of being sick to your stomach gets worse.  Your vomiting gets worse.  You lose feeling (have numbness) in part of your body.  You feel prickling and tingling in a part of your body. Get help right away if:  You have trouble moving or talking.  You are always dizzy.  You pass out (faint).  You get very bad headaches.  You feel weak in your hands, arms, or legs.  You have changes in your hearing.  You have changes in how you see (vision).  You get a stiff neck.  Bright light starts to bother you. Summary  Vertigo is the feeling that you or the things around you are moving when they are not.  Your doctor will do tests to find the cause of your vertigo.  You may be told to avoid some tasks, positions, or movements.  Contact a doctor if your medicine is not helping, or if you have a fever, new symptoms, or a change in behavior.  Get help right away if you get very bad headaches, or if you have changes in how you speak, hear, or see. This information is not intended to replace advice given to you by your health care provider. Make sure you discuss any questions you have with your health care provider. Document Revised: 02/25/2018 Document Reviewed: 02/25/2018 Elsevier Patient Education  2021 [employer].

## 2020-05-16 LAB — URINALYSIS W MICROSCOPIC + REFLEX CULTURE
Bacteria, UA: NONE SEEN /HPF
Bilirubin Urine: NEGATIVE
Glucose, UA: NEGATIVE
Hgb urine dipstick: NEGATIVE
Hyaline Cast: NONE SEEN /LPF
Ketones, ur: NEGATIVE
Leukocyte Esterase: NEGATIVE
Nitrites, Initial: NEGATIVE
Protein, ur: NEGATIVE
Specific Gravity, Urine: 1.021 (ref 1.001–1.03)
Squamous Epithelial / HPF: NONE SEEN /HPF (ref <=5)
WBC, UA: NONE SEEN /HPF (ref 0–5)
pH: 7.5 (ref 5.0–8.0)

## 2020-05-16 LAB — URINE CULTURE
MICRO NUMBER:: 11473542
SPECIMEN QUALITY:: ADEQUATE

## 2020-05-16 LAB — CULTURE INDICATED

## 2020-05-17 LAB — CBC WITH DIFFERENTIAL/PLATELET
Absolute Monocytes: 577 cells/uL (ref 200–950)
Basophils Absolute: 37 cells/uL (ref 0–200)
Basophils Relative: 0.4 %
Eosinophils Absolute: 186 cells/uL (ref 15–500)
Eosinophils Relative: 2 %
HCT: 44.4 % (ref 35.0–45.0)
Hemoglobin: 14.8 g/dL (ref 11.7–15.5)
Lymphs Abs: 2427 cells/uL (ref 850–3900)
MCH: 30.4 pg (ref 27.0–33.0)
MCHC: 33.3 g/dL (ref 32.0–36.0)
MCV: 91.2 fL (ref 80.0–100.0)
MPV: 11.1 fL (ref 7.5–12.5)
Monocytes Relative: 6.2 %
Neutro Abs: 6073 cells/uL (ref 1500–7800)
Neutrophils Relative %: 65.3 %
Platelets: 240 10*3/uL (ref 140–400)
RBC: 4.87 10*6/uL (ref 3.80–5.10)
RDW: 13.2 % (ref 11.0–15.0)
Total Lymphocyte: 26.1 %
WBC: 9.3 10*3/uL (ref 3.8–10.8)

## 2020-05-17 LAB — COMPREHENSIVE METABOLIC PANEL
AG Ratio: 1.6 (calc) (ref 1.0–2.5)
ALT: 15 U/L (ref 6–29)
AST: 15 U/L (ref 10–35)
Albumin: 3.8 g/dL (ref 3.6–5.1)
Alkaline phosphatase (APISO): 62 U/L (ref 37–153)
BUN: 17 mg/dL (ref 7–25)
CO2: 25 mmol/L (ref 20–32)
Calcium: 10.5 mg/dL — ABNORMAL HIGH (ref 8.6–10.4)
Chloride: 107 mmol/L (ref 98–110)
Creat: 0.63 mg/dL (ref 0.50–0.99)
Globulin: 2.4 g/dL (calc) (ref 1.9–3.7)
Glucose, Bld: 78 mg/dL (ref 65–99)
Potassium: 4.3 mmol/L (ref 3.5–5.3)
Sodium: 141 mmol/L (ref 135–146)
Total Bilirubin: 0.4 mg/dL (ref 0.2–1.2)
Total Protein: 6.2 g/dL (ref 6.1–8.1)

## 2020-05-17 LAB — PTH, INTACT AND CALCIUM
Calcium: 10.5 mg/dL — ABNORMAL HIGH (ref 8.6–10.4)
PTH: 58 pg/mL (ref 14–64)

## 2020-06-05 ENCOUNTER — Other Ambulatory Visit: Payer: Self-pay

## 2020-06-05 ENCOUNTER — Ambulatory Visit
Admission: RE | Admit: 2020-06-05 | Discharge: 2020-06-05 | Disposition: A | Discharge status: Home / Self Care | Payer: BC Managed Care – PPO | Source: Ambulatory Visit | Attending: Family Medicine | Admitting: Family Medicine

## 2020-06-05 DIAGNOSIS — R270 Ataxia, unspecified: Secondary | ICD-10-CM | POA: Diagnosis not present

## 2020-06-05 DIAGNOSIS — R110 Nausea: Secondary | ICD-10-CM

## 2020-06-05 DIAGNOSIS — R42 Dizziness and giddiness: Secondary | ICD-10-CM | POA: Diagnosis not present

## 2020-06-05 DIAGNOSIS — E213 Hyperparathyroidism, unspecified: Secondary | ICD-10-CM

## 2020-06-05 MED ORDER — GADOBENATE DIMEGLUMINE 529 MG/ML IV SOLN
20.0000 mL | Freq: Once | INTRAVENOUS | Status: AC | PRN
  Administered 2020-06-05: 20 mL via INTRAVENOUS

## 2020-06-07 ENCOUNTER — Telehealth: Payer: Self-pay

## 2020-06-07 NOTE — Telephone Encounter (Signed)
Patient would like referral to see ENT regarding ringing in ears and dizziness.  Patient has scheduled an appt with Dr. Osborn Coho at [facility] for 07/20/19.  Their office is requiring referral from patient's PCP.  Patient aware Dr. Claiborne Billings is out of office today, returning tomorrow.  Please call patient tomorrow or after referral has been entered.  Patient can be reached at [phone].

## 2020-06-07 NOTE — Telephone Encounter (Signed)
Please advise if okay to place referral.

## 2020-06-08 ENCOUNTER — Telehealth: Payer: Self-pay | Admitting: Family Medicine

## 2020-06-08 DIAGNOSIS — R42 Dizziness and giddiness: Secondary | ICD-10-CM

## 2020-06-08 NOTE — Telephone Encounter (Signed)
Completed- see phone note with results.

## 2020-06-08 NOTE — Telephone Encounter (Signed)
Please inform patient the following information: Her MRI showed no cause for her vertigo/dzziness or difficulty walking.  It did result w/ mild small vessel ischemic changes. This is cased by age, high blood pressure, diabetes and./or elevated cholesterol- any of the above can contribute.  Controlling those factors which can be controlled and taking a statin, which she is on, will help decrease chances of progression. This is not the cause of dizziness/vertigo since it is mild, but at later stages it can increase risk of stroke or dementia.  I have placed the referral to ENT she requested.

## 2020-06-08 NOTE — Telephone Encounter (Signed)
Spoke with pt regarding labs and instructions.   

## 2020-06-15 DIAGNOSIS — M25511 Pain in right shoulder: Secondary | ICD-10-CM | POA: Diagnosis not present

## 2020-06-15 DIAGNOSIS — M25512 Pain in left shoulder: Secondary | ICD-10-CM | POA: Diagnosis not present

## 2020-06-16 ENCOUNTER — Telehealth: Payer: Self-pay | Admitting: Cardiology

## 2020-06-16 MED ORDER — NITROGLYCERIN 0.4 MG SL SUBL: 0.4000 mg | SUBLINGUAL_TABLET | SUBLINGUAL | 11 refills | Status: DC | PRN

## 2020-06-16 NOTE — Telephone Encounter (Signed)
Pt c/o medication issue:  1. Name of Medication: nitroGLYCERIN (NITROSTAT) 0.4 MG SL tablet(Expired)  2. How are you currently taking this medication (dosage and times per day)? As written  3. Are you having a reaction (difficulty breathing--STAT)?  No   4. What is your medication issue?  Patient needs a new prescription sent to [id number] - [place],  - [address] [place] [address]

## 2020-06-16 NOTE — Telephone Encounter (Signed)
Spoke to patient Ntg refill sent to pharmacy.

## 2020-06-28 DIAGNOSIS — Z1231 Encounter for screening mammogram for malignant neoplasm of breast: Secondary | ICD-10-CM | POA: Diagnosis not present

## 2020-06-28 DIAGNOSIS — M79675 Pain in left toe(s): Secondary | ICD-10-CM | POA: Diagnosis not present

## 2020-06-28 DIAGNOSIS — S92515A Nondisplaced fracture of proximal phalanx of left lesser toe(s), initial encounter for closed fracture: Secondary | ICD-10-CM | POA: Diagnosis not present

## 2020-06-28 LAB — HM MAMMOGRAPHY

## 2020-07-07 ENCOUNTER — Telehealth: Payer: Self-pay

## 2020-07-07 ENCOUNTER — Other Ambulatory Visit: Payer: Self-pay | Admitting: Cardiology

## 2020-07-07 NOTE — Telephone Encounter (Signed)
Please inform patient her mammogram is normal.   thanks.

## 2020-07-07 NOTE — Telephone Encounter (Signed)
Received Mammogram results from [facility] on 07/07/20. Placed on PCP desk for review.

## 2020-07-07 NOTE — Telephone Encounter (Signed)
Spoke with pt regarding labs and instructions.   

## 2020-07-08 ENCOUNTER — Ambulatory Visit: Payer: BC Managed Care – PPO | Admitting: Internal Medicine

## 2020-07-08 DIAGNOSIS — S92413A Displaced fracture of proximal phalanx of unspecified great toe, initial encounter for closed fracture: Secondary | ICD-10-CM | POA: Insufficient documentation

## 2020-07-08 DIAGNOSIS — S92515D Nondisplaced fracture of proximal phalanx of left lesser toe(s), subsequent encounter for fracture with routine healing: Secondary | ICD-10-CM | POA: Diagnosis not present

## 2020-07-09 NOTE — Progress Notes (Signed)
Cardiology Office Note    Date:  07/14/2020   ID:  Alyssa Phillips, Alyssa Phillips 11/15/1955, MRN [id number]  PCP:  Natalia Leatherwood, DO  Cardiologist:  Dr. Swaziland  Chief Complaint  Patient presents with  . Coronary Artery Disease    History of Present Illness:  Alyssa Phillips is a 65 y.o. female with past medical history of hypertension, HLD, asthma, reported history of mitral valve prolapse who was  admitted for anterior STEMI on 11/01/2017.  Patient was taken urgently to Cath Lab which showed severe single-vessel disease with 95% proximal LAD treated with DES x1.  EF 45 to 50% by LV gram.  Echocardiogram showed EF 40 to 45%. There was no evidence of MV prolapse.  Postprocedure, patient was placed on aspirin and Brilinta and the high-dose statin.  She developed a cough on lisinopril. She was also placed on beta-blocker and ARB.  Her brilinta was discontinued at one year.  On follow up today she is doing very well from a cardiac standpoint. She has had a number of orthopedic issues in the last six months including rotator cuff tear, frozen shoulder, bilateral carpel tunnel, and more recently fracture of several toes on her left foot. These issues have limited her activity. This has made it more difficult to keep her weight down. She denies any chest pain or SOB. No palpitations. She has been having vertigo. Reports MRI was negative. Scheduled to see ENT.     Past Medical History:  Diagnosis Date  . Allergic rhinitis   . Arthritis    Osteoarthrits-knees-hx. RTKA  . Asthma    environmental agents induced asthma  . Coronary artery disease   . Family history of adverse reaction to anesthesia    daughter very sensitive to anesthesia  . GERD (gastroesophageal reflux disease)   . Heart murmur    hx. mitral valve proplapse-mostly asymptomatic-->not apprectiaed n echo  . Hepatic steatosis 03/05/2008   [place]  . Hyperlipidemia   . Hypertension   . Morbid obesity due to excess calories (HCC)   .  STEMI (ST elevation myocardial infarction) (HCC) 10/2017   LAD treated with DESx1  . UTI (urinary tract infection)   . Vitamin D deficiency     Past Surgical History:  Procedure Laterality Date  . 5th finger surgery Right 1990   unknown injury  . CARDIAC CATHETERIZATION    . CHOLECYSTECTOMY    . COLONOSCOPY WITH PROPOFOL N/A 06/08/2016   Procedure: COLONOSCOPY WITH PROPOFOL;  Surgeon: Dorena Cookey, MD;  Location: [facility] END[facility]COPY;  Service: Endoscopy;  Laterality: N/A;  . CORONARY STENT INTERVENTION N/A 11/01/2017   Procedure: CORONARY STENT INTERVENTION;  Surgeon: Swaziland, Jennfier Abdulla M, MD;  Location: [facility] INVASIVE CV LAB;  Service: Cardiovascular;  Laterality: N/A;  Resolute Onyx 4.5 mm x12 mm  . CORONARY/GRAFT ACUTE MI REVASCULARIZATION N/A 11/01/2017   Procedure: Coronary/Graft Acute MI Revascularization;  Surgeon: Swaziland, Escher Harr M, MD;  Location: [facility] INVASIVE CV LAB;  Service: Cardiovascular;  Laterality: N/A;  . KNEE ARTHROSCOPY  05-02-12   x2 right/ x1 left  . LEFT HEART CATH AND CORONARY ANGIOGRAPHY N/A 11/01/2017   Procedure: LEFT HEART CATH AND CORONARY ANGIOGRAPHY;  Surgeon: Swaziland, Alizeh Madril M, MD;  Location: [facility] INVASIVE CV LAB;  Service: Cardiovascular;  Laterality: N/A;  . REPLACEMENT TOTAL KNEE Right 10/2010  . TOTAL KNEE ARTHROPLASTY  05/10/2012   Procedure: TOTAL KNEE ARTHROPLASTY;  Surgeon: Loanne Drilling, MD;  Location: [facility] ORS;  Service: Orthopedics;  Laterality: Left;  .  TUBAL LIGATION    . UMBILICAL HERNIA REPAIR      Current Medications: Outpatient Medications Prior to Visit  Medication Sig Dispense Refill  . albuterol (VENTOLIN HFA) 108 (90 Base) MCG/ACT inhaler Inhale 1-2 puffs into the lungs every 6 (six) hours as needed for wheezing or shortness of breath. 6 g 11  . aspirin EC 81 MG EC tablet Take 1 tablet (81 mg total) by mouth daily.    Marland Kitchen. atorvastatin (LIPITOR) 80 MG tablet TAKE 1 TABLET (80 MG TOTAL) BY MOUTH DAILY AT 6 PM. 90 tablet 3  . azelastine (OPTIVAR) 0.05 % ophthalmic  solution azelastine 0.05 % eye drops    . B Complex Vitamins (B COMPLEX 1 PO) Take by mouth.    . budesonide-formoterol (SYMBICORT) 160-4.5 MCG/ACT inhaler Inhale 2 puffs into the lungs 2 (two) times daily. 3 Inhaler 1  . candesartan (ATACAND) 4 MG tablet TAKE 1 TABLET BY MOUTH EVERY DAY 90 tablet 2  . cholecalciferol (VITAMIN D) 1000 units tablet Take 1,000 Units by mouth 2 (two) times daily.    . diclofenac sodium (VOLTAREN) 1 % GEL Apply 2-4 g topically daily as needed (as directed to painful sites).     Marland Kitchen. erythromycin ophthalmic ointment Place 1 application into the right eye 4 (four) times daily. For 5-7 days 3.5 g 0  . levalbuterol (XOPENEX) 1.25 MG/3ML nebulizer solution levalbuterol 1.25 mg/3 mL solution for nebulization  USE 1 VIAL VIA NEBULIZER 3 TIMES A DAY    . levocetirizine (XYZAL) 5 MG tablet Take 1 tablet (5 mg total) by mouth every evening. 90 tablet 3  . meclizine (ANTIVERT) 25 MG tablet Take 1 tablet (25 mg total) by mouth 3 (three) times daily as needed for dizziness. 90 tablet 0  . methocarbamol (ROBAXIN) 500 MG tablet Take 1 tablet (500 mg total) by mouth at bedtime as needed for muscle spasms. 10 tablet 0  . metoprolol succinate (TOPROL-XL) 50 MG 24 hr tablet TAKE 1 TABLET BY MOUTH EVERY DAY WITH OR IMMEDIATELY FOLLOWING A MEAL 90 tablet 3  . mometasone (NASONEX) 50 MCG/ACT nasal spray Place 2 sprays into the nose every morning. 17 g 5  . montelukast (SINGULAIR) 10 MG tablet Take 1 tablet (10 mg total) by mouth at bedtime. 90 tablet 3  . Multiple Vitamin (MULTIVITAMIN) tablet Take 1 tablet by mouth daily.    . nitroGLYCERIN (NITROSTAT) 0.4 MG SL tablet Place 1 tablet (0.4 mg total) under the tongue every 5 (five) minutes as needed for chest pain. 25 tablet 11  . ondansetron (ZOFRAN) 4 MG tablet Take 1 tablet (4 mg total) by mouth every 8 (eight) hours as needed for nausea or vomiting. 30 tablet 1  . traMADol (ULTRAM) 50 MG tablet Take 50 mg by mouth every 6 (six) hours as  needed.    Marland Kitchen. HYDROcodone-acetaminophen (NORCO/VICODIN) 5-325 MG tablet Take 1 tablet by mouth every 4 (four) hours as needed.    . predniSONE (STERAPRED UNI-PAK 21 TAB) 5 MG (21) TBPK tablet Take by mouth.     No facility-administered medications prior to visit.     Allergies:   Compazine [prochlorperazine edisylate], Advil [ibuprofen], and Aspirin   Social History   Socioeconomic History  . Marital status: Married    Spouse name: Nida BoatmanBrad  . Number of children: Not on file  . Years of education: 4616  . Highest education level: Bachelor's degree (e.g., BA, AB, BS)  Occupational History  . Not on file  Tobacco Use  .  Smoking status: Never Smoker  . Smokeless tobacco: Never Used  Vaping Use  . Vaping Use: Never used  Substance and Sexual Activity  . Alcohol use: Yes    Comment: occ. rare  . Drug use: No  . Sexual activity: Yes    Partners: Male  Other Topics Concern  . 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[employer], Engineer, agricultural:      -smoke alarm in the home:Yes     - wears seatbelt: Yes     - Feels safe in their relationships: Yes   Social Determinants of Corporate investment banker Strain: Not on file  Food Insecurity: Not on file  Transportation Needs: Not on file  Physical Activity: Not on file  Stress: Not on file  Social Connections: Not on file     Family History:  The patient's family history includes Alcohol abuse in her father; Arthritis in her father, maternal grandfather, maternal grandmother, mother, and paternal grandmother; Asthma in her mother; Breast cancer (age of onset: 62) in her maternal grandmother; COPD in her father; Cancer in her mother; Diabetes in her maternal grandfather and paternal grandmother; Hearing loss in her father, maternal grandmother, mother, paternal grandfather, and paternal grandmother; Hypertension in her father, maternal grandfather, and mother; Learning  disabilities in her paternal grandfather; Lymphoma in her mother; Miscarriages / Stillbirths in her mother and sister; Stroke in her maternal grandfather.   ROS:   Please see the history of present illness.    ROS All other systems reviewed and are negative.   PHYSICAL EXAM:   VS:  BP 118/64   Pulse 64   Ht 5\' 9"  (1.753 m)   Wt 247 lb (112 kg)   SpO2 98%   BMI 36.48 kg/m    GEN: Well nourished, obesity, in no acute distress  HEENT: normal  Neck: no JVD, carotid bruits, or masses Cardiac: RRR; no murmurs, rubs, or gallops,no edema  Respiratory:  clear to auscultation bilaterally, normal work of breathing GI: soft, nontender, nondistended, + BS MS: no deformity or atrophy  Skin: warm and dry, no rash Neuro:  Alert and Oriented x 3, Strength and sensation are intact Psych: euthymic mood, full affect  Wt Readings from Last 3 Encounters:  07/14/20 247 lb (112 kg)  05/14/20 244 lb (110.7 kg)  01/19/20 239 lb (108.4 kg)      Studies/Labs Reviewed:   EKG:  EKG is not ordered today.     Recent Labs: 12/11/2019: TSH 2.64 05/14/2020: ALT 15; BUN 17; Creat 0.63; Hemoglobin 14.8; Platelets 240; Potassium 4.3; Sodium 141   Lipid Panel    Component Value Date/Time   CHOL 126 12/11/2019 0833   CHOL 113 06/23/2019 1120   TRIG 44.0 12/11/2019 0833   HDL 55.80 12/11/2019 0833   HDL 45 06/23/2019 1120   CHOLHDL 2 12/11/2019 0833   VLDL 8.8 12/11/2019 0833   LDLCALC 62 12/11/2019 0833   LDLCALC 56 06/23/2019 1120    Additional studies/ records that were reviewed today include:   Cath: 11/01/17   Ost 1st Mrg lesion is 40% stenosed.  Prox LAD lesion is 95% stenosed.  Post intervention, there is a 0% residual stenosis.  A drug-eluting stent was successfully placed using a STENT RESOLUTE ONYX 4.5X12.  There is mild left ventricular systolic dysfunction.  LV end diastolic pressure is mildly elevated.  The left ventricular ejection fraction is 50-55% by visual  estimate.  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 Mildly impaired LV function. EF 45-50% 3. Mildly elevated LVEDP 4. Successful PCI of the proximal LAD with DES x 1   Recommend uninterrupted dual antiplatelet therapy with Aspirin 81mg  daily and Ticagrelor 90mg  twice daily for a minimum of 12 months (ACS - Class I recommendation).  2D echo 11/01/17 Study Conclusions  - Left ventricle: The cavity size was normal. There was mild focal basal hypertrophy of the septum. Systolic function was mildly to moderately reduced. The estimated ejection fraction was in the range of 40% to 45%. Wall motion abnormalities noted below. The study is indeterminate for the evaluation of LV diastolic function. Acoustic contrast opacification revealed no evidence ofthrombus. - Regional wall motion abnormality: Akinesis of the apical anterior and apical myocardium; hypokinesis of the mid anterior, mid anteroseptal, apical septal, and apical lateral myocardium. - Aortic valve: There was no regurgitation. - Mitral valve: Structurally normal valve. No echocardiographic evidence for prolapse. Transvalvular velocity was within the normal range. There was no evidence for stenosis. There was no regurgitation. - Tricuspid valve: There was no significant regurgitation. - Pulmonic valve: There was no significant regurgitation.  Impressions:  - -LV EF mild to moderately reduced with focal wall motion abnormalities. Hypokinesis of anterior and anteroseptal walls and apex. Echo contrast used to better evaluate wall motion and exclude LV thrombus. -No evidence of mitral valve prolapse or regurgitation. -Technically difficult study. Right sided structures not well visualized but appear grossly normal.      ASSESSMENT:    1. Coronary artery disease involving native coronary artery of native heart without angina pectoris   2. Essential hypertension   3.  Mixed hyperlipidemia      PLAN:  In order of problems listed above:  1. CAD: s/p  anterior MI 11/01/17, underwent DES to LAD.  No other residual disease. she is asymptomatic. Continue ASA.  Continue  high-dose statin.  On beta blocker and low dose ARB.  2. Hyperlipidemia: Continue Lipitor. Last LDL 62.   3. Hypertension: Blood pressure well controlled.    4. Asthma symptoms resolved with weight loss and treatment of CAD.   5.   Obesity. Continue efforts at weight loss.   Follow up in one year.   Medication Adjustments/Labs and Tests Ordered: Current medicines are reviewed at length with the patient today.  Concerns regarding medicines are outlined above.  Medication changes, Labs and Tests ordered today are listed in the Patient Instructions below. There are no Patient Instructions on file for this visit.   Signed, Gumaro Brightbill 11/03/17, MD  07/14/2020 8:14 AM    [facility] [address] [place], [place], [place]  [place] Phone: [phone]; Fax: [phone]

## 2020-07-14 ENCOUNTER — Ambulatory Visit: Payer: BC Managed Care – PPO | Admitting: Cardiology

## 2020-07-14 ENCOUNTER — Other Ambulatory Visit: Payer: Self-pay

## 2020-07-14 ENCOUNTER — Encounter: Payer: Self-pay | Admitting: Cardiology

## 2020-07-14 VITALS — BP 118/64 | HR 64 | Ht 69.0 in | Wt 247.0 lb

## 2020-07-14 DIAGNOSIS — E782 Mixed hyperlipidemia: Secondary | ICD-10-CM

## 2020-07-14 DIAGNOSIS — I2510 Atherosclerotic heart disease of native coronary artery without angina pectoris: Secondary | ICD-10-CM | POA: Diagnosis not present

## 2020-07-14 DIAGNOSIS — I10 Essential (primary) hypertension: Secondary | ICD-10-CM

## 2020-07-19 DIAGNOSIS — H903 Sensorineural hearing loss, bilateral: Secondary | ICD-10-CM | POA: Diagnosis not present

## 2020-07-19 DIAGNOSIS — H8110 Benign paroxysmal vertigo, unspecified ear: Secondary | ICD-10-CM | POA: Diagnosis not present

## 2020-07-19 DIAGNOSIS — H9190 Unspecified hearing loss, unspecified ear: Secondary | ICD-10-CM | POA: Insufficient documentation

## 2020-07-19 DIAGNOSIS — H9313 Tinnitus, bilateral: Secondary | ICD-10-CM | POA: Insufficient documentation

## 2020-07-19 DIAGNOSIS — J302 Other seasonal allergic rhinitis: Secondary | ICD-10-CM | POA: Diagnosis not present

## 2020-07-26 DIAGNOSIS — H1045 Other chronic allergic conjunctivitis: Secondary | ICD-10-CM | POA: Diagnosis not present

## 2020-07-26 DIAGNOSIS — J300 Vasomotor rhinitis: Secondary | ICD-10-CM | POA: Diagnosis not present

## 2020-07-26 DIAGNOSIS — J4530 Mild persistent asthma, uncomplicated: Secondary | ICD-10-CM | POA: Diagnosis not present

## 2020-08-02 ENCOUNTER — Ambulatory Visit: Payer: BC Managed Care – PPO | Admitting: Internal Medicine

## 2020-08-02 ENCOUNTER — Encounter: Payer: Self-pay | Admitting: Internal Medicine

## 2020-08-02 ENCOUNTER — Other Ambulatory Visit: Payer: Self-pay

## 2020-08-02 VITALS — BP 114/72 | HR 68

## 2020-08-02 DIAGNOSIS — E213 Hyperparathyroidism, unspecified: Secondary | ICD-10-CM | POA: Diagnosis not present

## 2020-08-02 LAB — ALBUMIN: Albumin: 3.7 g/dL (ref 3.5–5.2)

## 2020-08-02 LAB — BASIC METABOLIC PANEL
BUN: 18 mg/dL (ref 6–23)
CO2: 27 mEq/L (ref 19–32)
Calcium: 10.3 mg/dL (ref 8.4–10.5)
Chloride: 109 mEq/L (ref 96–112)
Creatinine, Ser: 0.66 mg/dL (ref 0.40–1.20)
GFR: 92.03 mL/min (ref >60.00)
Glucose, Bld: 76 mg/dL (ref 70–99)
Potassium: 3.9 mEq/L (ref 3.5–5.1)
Sodium: 142 mEq/L (ref 135–145)

## 2020-08-02 LAB — VITAMIN D 25 HYDROXY (VIT D DEFICIENCY, FRACTURES): VITD: 35.02 ng/mL (ref 30.00–100.00)

## 2020-08-02 NOTE — Progress Notes (Signed)
Name: Alyssa Phillips  MRN/ DOB: [id number], [birth year]    Age/ Sex: 65 y.o., female     PCP: Natalia Leatherwood, DO   Reason for Endocrinology Evaluation: Hypercalcemia      Initial Endocrinology Clinic Visit: 09/05/2019    PATIENT IDENTIFIER: [name] is a 65 y.o., female with a past medical history of  CAD ( 10/2017) , HTN and Dyslipidemia.  She has followed with [facility] since 09/05/2019 for consultative assistance with management of her Hyperthyroidism  HISTORICAL SUMMARY:  [name] indicates that she was first diagnosed with hypercalcemia in 06/2019 with a serum calcium of 10.9 mg/dL ( Corrected 70.35) during routine check up   she has a history of a leg fracture in the past ( S/P slip injury) and has osteopenia based on DXA from 02/2019  Ca/Cr ratio was 0.0054  SUBJECTIVE:   Today (08/02/2020):  [name] is here for a follow up on hypercalcemia.    Denies polydipsia but has frequency No renal stones Left ankle stress fracture after trip injury 12/2019  Broken toes due to stubbing her toes against a chair 07/2020  Not on OTC calcium  Consumes 2  Servings of dairy  She is on vitamin D    HISTORY:  Past Medical History:  Past Medical History:  Diagnosis Date  . Allergic rhinitis   . Arthritis    Osteoarthrits-knees-hx. RTKA  . Asthma    environmental agents induced asthma  . Coronary artery disease   . Family history of adverse reaction to anesthesia    daughter very sensitive to anesthesia  . GERD (gastroesophageal reflux disease)   . Heart murmur    hx. mitral valve proplapse-mostly asymptomatic-->not apprectiaed n echo  . Hepatic steatosis 03/05/2008   [place]  . Hyperlipidemia   . Hypertension   . Morbid obesity due to excess calories (HCC)   . STEMI (ST elevation myocardial infarction) (HCC) 10/2017   LAD treated with DESx1  . UTI (urinary tract infection)   . Vitamin D deficiency    Past Surgical History:  Past  Surgical History:  Procedure Laterality Date  . 5th finger surgery Right 1990   unknown injury  . CARDIAC CATHETERIZATION    . CHOLECYSTECTOMY    . COLONOSCOPY WITH PROPOFOL N/A 06/08/2016   Procedure: COLONOSCOPY WITH PROPOFOL;  Surgeon: Dorena Cookey, MD;  Location: [facility] ENDOSCOPY;  Service: Endoscopy;  Laterality: N/A;  . CORONARY STENT INTERVENTION N/A 11/01/2017   Procedure: CORONARY STENT INTERVENTION;  Surgeon: Swaziland, Peter M, MD;  Location: [facility] INVASIVE CV LAB;  Service: Cardiovascular;  Laterality: N/A;  Resolute Onyx 4.5 mm x12 mm  . CORONARY/GRAFT ACUTE MI REVASCULARIZATION N/A 11/01/2017   Procedure: Coronary/Graft Acute MI Revascularization;  Surgeon: Swaziland, Peter M, MD;  Location: [facility] INVASIVE CV LAB;  Service: Cardiovascular;  Laterality: N/A;  . KNEE ARTHROSCOPY  05-02-12   x2 right/ x1 left  . LEFT HEART CATH AND CORONARY ANGIOGRAPHY N/A 11/01/2017   Procedure: LEFT HEART CATH AND CORONARY ANGIOGRAPHY;  Surgeon: Swaziland, Peter M, MD;  Location[facility] INVASIVE CV LAB;  Service: Cardiovascular;  Laterality: N/A;  . REPLACEMENT TOTAL KNEE Right 10/2010  . TOTAL KNEE ARTHROPLASTY  05/10/2012   Procedure: TOTAL KNEE ARTHROPLASTY;  Surgeon: Loanne Drilling, MD;  Location: [facility] ORS;  Service: Orthopedics;  Laterality: Left;  . TUBAL LIGATION    . UMBILICAL HERNIA REPAIR      Social History:  reports that she has never smoked. She has  never used smokeless tobacco. She reports current alcohol use. She reports that she does not use drugs. Family History:  Family History  Problem Relation Age of Onset  . Hypertension Mother   . Arthritis Mother   . Lymphoma Mother   . Asthma Mother   . Cancer Mother   . Hearing loss Mother   . Miscarriages / [place] Mother   . COPD Father   . Alcohol abuse Father   . Arthritis Father   . Hearing loss Father   . Hypertension Father   . Miscarriages / Stillbirths Sister   . Arthritis Maternal Grandmother   . Hearing loss Maternal Grandmother   .  Breast cancer Maternal Grandmother 32  . Arthritis Maternal Grandfather   . Diabetes Maternal Grandfather   . Hypertension Maternal Grandfather   . Stroke Maternal Grandfather   . Arthritis Paternal Grandmother   . Diabetes Paternal Grandmother   . Hearing loss Paternal Grandmother   . Hearing loss Paternal Grandfather   . Learning disabilities Paternal Grandfather      HOME MEDICATIONS: Allergies as of 08/02/2020      Reactions   Compazine [prochlorperazine Edisylate] Other (See Comments)   Eyes rolled back, tongue curled up   Advil [ibuprofen] Hives   Aspirin Other (See Comments)   HAS to be EC aspirin!!      Medication List       Accurate as of August 02, 2020  8:36 AM. If you have any questions, ask your nurse or doctor.        albuterol 108 (90 Base) MCG/ACT inhaler Commonly known as: VENTOLIN HFA Inhale 1-2 puffs into the lungs every 6 (six) hours as needed for wheezing or shortness of breath.   aspirin 81 MG EC tablet Take 1 tablet (81 mg total) by mouth daily.   atorvastatin 80 MG tablet Commonly known as: LIPITOR TAKE 1 TABLET (80 MG TOTAL) BY MOUTH DAILY AT 6 PM.   azelastine 0.05 % ophthalmic solution Commonly known as: OPTIVAR azelastine 0.05 % eye drops   B COMPLEX 1 PO Take by mouth.   budesonide-formoterol 160-4.5 MCG/ACT inhaler Commonly known as: SYMBICORT Inhale 2 puffs into the lungs 2 (two) times daily.   candesartan 4 MG tablet Commonly known as: ATACAND TAKE 1 TABLET BY MOUTH EVERY DAY   cholecalciferol 1000 units tablet Commonly known as: VITAMIN D Take 1,000 Units by mouth 2 (two) times daily.   diclofenac sodium 1 % Gel Commonly known as: VOLTAREN Apply 2-4 g topically daily as needed (as directed to painful sites).   erythromycin ophthalmic ointment Place 1 application into the right eye 4 (four) times daily. For 5-7 days   levalbuterol 1.25 MG/3ML nebulizer solution Commonly known as: XOPENEX levalbuterol 1.25 mg/3 mL  solution for nebulization  USE 1 VIAL VIA NEBULIZER 3 TIMES A DAY   levocetirizine 5 MG tablet Commonly known as: XYZAL Take 1 tablet (5 mg total) by mouth every evening.   meclizine 25 MG tablet Commonly known as: ANTIVERT Take 1 tablet (25 mg total) by mouth 3 (three) times daily as needed for dizziness.   methocarbamol 500 MG tablet Commonly known as: ROBAXIN Take 1 tablet (500 mg total) by mouth at bedtime as needed for muscle spasms.   metoprolol succinate 50 MG 24 hr tablet Commonly known as: TOPROL-XL TAKE 1 TABLET BY MOUTH EVERY DAY WITH OR IMMEDIATELY FOLLOWING A MEAL   mometasone 50 MCG/ACT nasal spray Commonly known as: NASONEX Place 2 sprays into the  nose every morning.   montelukast 10 MG tablet Commonly known as: SINGULAIR Take 1 tablet (10 mg total) by mouth at bedtime.   multivitamin tablet Take 1 tablet by mouth daily.   nitroGLYCERIN 0.4 MG SL tablet Commonly known as: Nitrostat Place 1 tablet (0.4 mg total) under the tongue every 5 (five) minutes as needed for chest pain.   ondansetron 4 MG tablet Commonly known as: ZOFRAN Take 1 tablet (4 mg total) by mouth every 8 (eight) hours as needed for nausea or vomiting.   traMADol 50 MG tablet Commonly known as: ULTRAM Take 50 mg by mouth every 6 (six) hours as needed.         OBJECTIVE:   PHYSICAL EXAM: VS: BP 114/72   Pulse 68   SpO2 97%    EXAM: General: Pt appears well and is in NAD  Neck: General: Supple without adenopathy. Thyroid: Thyroid size normal.  No goiter or nodules appreciated. No thyroid bruit.  Lungs: Clear with good BS bilat with no rales, rhonchi, or wheezes  Heart: Auscultation: RRR.  Abdomen: Normoactive bowel sounds, soft, nontender, without masses or organomegaly palpable  Extremities:  BL LE: No pretibial edema on right, left boot in place  Mental Status: Judgment, insight: Intact Orientation: Oriented to time, place, and person Mood and affect: No depression,  anxiety, or agitation     DATA REVIEWED:  Results for NAUDIA, CROSLEY (MRN [id number]) as of 08/03/2020 15:08  Ref. Range 08/02/2020 08:40  Sodium Latest Ref Range: 135 - 145 mEq/L 142  Potassium Latest Ref Range: 3.5 - 5.1 mEq/L 3.9  Chloride Latest Ref Range: 96 - 112 mEq/L 109  CO2 Latest Ref Range: 19 - 32 mEq/L 27  Glucose Latest Ref Range: 70 - 99 mg/dL 76  BUN Latest Ref Range: 6 - 23 mg/dL 18  Creatinine Latest Ref Range: 0.40 - 1.20 mg/dL 7.04  Calcium Latest Ref Range: 8.4 - 10.5 mg/dL 88.8  Calcium Ionized Latest Ref Range: 4.8 - 5.6 mg/dL 9.16 (H)  Albumin Latest Ref Range: 3.5 - 5.2 g/dL 3.7  GFR Latest Ref Range: >60.00 mL/min 92.03  VITD Latest Ref Range: 30.00 - 100.00 ng/mL 35.02  PTH, Intact Latest Ref Range: 16 - 77 pg/mL 54      ASSESSMENT / PLAN / RECOMMENDATIONS:   1. 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  Will repeat 24-hour urine collection and if she continues to have consistent low urinary calcium excretion,will refer her for genetic testing for FHH -DXA  Shows osteopenia (02/2019) - Repeat labs today show elevated ionized calcium with normal renal function, PTH and Vitamin D   Recommendations  - Stay Hydrated  - Avoid OVER THE COUNTER calcium tablets - Make sure you consume 2-3 servings of calcium a day in your diet      Follow-up in 1 yr    Signed electronically by: Lyndle Herrlich, MD  [facility] Endocrinology  [facility] [address] [place] [place], [place] [address] Phone: [phone] FAX: [phone]      CC: Natalia Leatherwood, DO [address] [place] [address] Phone: [phone]  Fax: [phone]   Return to Endocrinology clinic as below: Future Appointments  Date Time Provider Department Center  08/03/2021  7:30 AM Aryanne Gilleland, Konrad Dolores, MD  [id number] None

## 2020-08-02 NOTE — Patient Instructions (Addendum)
-   Stay Hydrated  - Avoid OVER THE COUNTER calcium tablets - Make sure you consume 2-3 servings of calcium a day in your diet     24-Hour Urine Collection   You will be collecting your urine for a 24-hour period of time.  Your timer starts with your first urine of the morning (For example - If you first pee at 9AM, your timer will start at 9AM)  Throw away your first urine of the morning  Collect your urine every time you pee for the next 24 hours STOP your urine collection 24 hours after you started the collection (For example - You would stop at 9AM the day after you started)

## 2020-08-03 ENCOUNTER — Encounter: Payer: Self-pay | Admitting: Internal Medicine

## 2020-08-03 LAB — PARATHYROID HORMONE, INTACT (NO CA): PTH: 54 pg/mL (ref 16–77)

## 2020-08-03 LAB — CALCIUM, IONIZED: Calcium, Ion: 5.84 mg/dL — ABNORMAL HIGH (ref 4.8–5.6)

## 2020-08-15 HISTORY — PX: CARPAL TUNNEL RELEASE: SHX101

## 2020-08-16 DIAGNOSIS — H903 Sensorineural hearing loss, bilateral: Secondary | ICD-10-CM | POA: Diagnosis not present

## 2020-08-18 ENCOUNTER — Other Ambulatory Visit: Payer: Self-pay | Admitting: Cardiology

## 2020-08-26 DIAGNOSIS — G5603 Carpal tunnel syndrome, bilateral upper limbs: Secondary | ICD-10-CM | POA: Diagnosis not present

## 2020-09-02 DIAGNOSIS — M19072 Primary osteoarthritis, left ankle and foot: Secondary | ICD-10-CM | POA: Diagnosis not present

## 2020-09-03 DIAGNOSIS — H8113 Benign paroxysmal vertigo, bilateral: Secondary | ICD-10-CM | POA: Diagnosis not present

## 2020-09-07 DIAGNOSIS — G5602 Carpal tunnel syndrome, left upper limb: Secondary | ICD-10-CM | POA: Diagnosis not present

## 2020-09-17 DIAGNOSIS — G5602 Carpal tunnel syndrome, left upper limb: Secondary | ICD-10-CM | POA: Diagnosis not present

## 2020-09-24 DIAGNOSIS — M25512 Pain in left shoulder: Secondary | ICD-10-CM | POA: Diagnosis not present

## 2020-09-24 DIAGNOSIS — M7501 Adhesive capsulitis of right shoulder: Secondary | ICD-10-CM | POA: Diagnosis not present

## 2020-11-12 DIAGNOSIS — Z4789 Encounter for other orthopedic aftercare: Secondary | ICD-10-CM | POA: Insufficient documentation

## 2020-11-18 DIAGNOSIS — W01198A Fall on same level from slipping, tripping and stumbling with subsequent striking against other object, initial encounter: Secondary | ICD-10-CM | POA: Diagnosis not present

## 2020-11-18 DIAGNOSIS — S0990XA Unspecified injury of head, initial encounter: Secondary | ICD-10-CM | POA: Diagnosis not present

## 2020-11-19 ENCOUNTER — Telehealth: Payer: Self-pay

## 2020-11-19 NOTE — Telephone Encounter (Signed)
Spoke with pt and [facility] to [facility]. Pt was informed to go to ED if experience dizziness, HA, or confusion

## 2020-11-19 NOTE — Telephone Encounter (Signed)
[facility] Day - Client TELEPHONE ADVICE RECORD AccessNurse Patient Name: Alyssa Phillips Shon Millet Gender: Female DOB: 07-12-1955 Age: 65 Y 9 M 4 D Return Phone Number: [phone] (Primary) Address: City/ State/ Zip: [place]  [address] Client [facility] Day - Client Client Site [facility] - Day Physician Claiborne Billings, [place] Contact Type Call Who Is Calling Patient / Member / Family / Caregiver Call Type Triage / Clinical Relationship To Patient Self Return Phone Number [phone] (Primary) Chief Complaint Head Injury (non urgent symptom) Reason for Call Symptomatic / Request for Health Information Initial Comment The caller states that she fell yesterday and hit the back of her head on a door handle. The caller reports that the area of injury is very tender to the touch; however, the caller denies having confusion, dizziness, or a headache. Translation No Nurse Assessment Nurse: Hermelinda Dellen, RN, Brandi Date/Time (Eastern Time): 11/18/2020 4:39:03 PM Confirm and document reason for call. If symptomatic, describe symptoms. ---The caller states that she fell yesterday and hit the back of her head on a door handle. The area of injury is very tender to the touch. Denies having confusion, dizziness, or a headache. Takes ASA daily and bruises easily. Reports hx heart attack last year. Does the patient have any new or worsening symptoms? ---Yes Will a triage be completed? ---Yes Related visit to physician within the last 2 weeks? ---No Does the PT have any chronic conditions? (i.e. diabetes, asthma, this includes High risk factors for pregnancy, etc.) ---Yes List chronic conditions. ---Hx: heart attack Arthritis replacement body parts HTN elevated cholesterol Is this a behavioral health or substance abuse call? ---No Guidelines Guideline Title Affirmed Question Affirmed Notes Nurse Date/Time (Eastern Time) Head Injury  [1] Age over 64 years AND [2] swelling or bruise Hermelinda Dellen, RN, Merry Proud 11/18/2020 4:41:14 PM PLEASE NOTE: All timestamps contained within this report are represented as [place] Standard Time. CONFIDENTIALTY NOTICE: This fax transmission is intended only for the addressee. It contains information that is legally privileged, confidential or otherwise protected from use or disclosure. If you are not the intended recipient, you are strictly prohibited from reviewing, disclosing, copying using or disseminating any of this information or taking any action in reliance on or regarding this information. If you have received this fax in error, please notify us immediately by telephone so that we can arrange for its return to [place]. Phone: [phone], Toll-Free: [phone], Fax: [phone] Page: 2 of 2 Call Id: [id number] Disp. Time Lamount Cohen Time) Disposition Final User 11/18/2020 4:44:54 PM See HCP within 4 Hours (or PCP triage) Yes Hermelinda Dellen, RN, Merry Proud Caller Disagree/Comply Comply Caller Understands Yes PreDisposition Call Doctor Care Advice Given Per Guideline SEE HCP (OR PCP TRIAGE) WITHIN 4 HOURS: * IF OFFICE WILL BE OPEN: You need to be seen within the next 3 or 4 hours. Call your doctor (or NP/PA) now or as soon as the office opens. CALL BACK IF: * You become worse Comments User: Marinda Elk, RN Date/Time (Eastern Time): 11/18/2020 4:48:32 PM Warm transferred to back line; NO OPEN appt today or tormorrow. Caller encouraged to go to ED. Referrals REFERRED TO PCP OFFICE

## 2020-12-22 DIAGNOSIS — M19072 Primary osteoarthritis, left ankle and foot: Secondary | ICD-10-CM | POA: Diagnosis not present

## 2020-12-27 DIAGNOSIS — H8113 Benign paroxysmal vertigo, bilateral: Secondary | ICD-10-CM | POA: Diagnosis not present

## 2020-12-30 DIAGNOSIS — H8113 Benign paroxysmal vertigo, bilateral: Secondary | ICD-10-CM | POA: Diagnosis not present

## 2021-01-04 DIAGNOSIS — H8113 Benign paroxysmal vertigo, bilateral: Secondary | ICD-10-CM | POA: Diagnosis not present

## 2021-01-07 DIAGNOSIS — H8113 Benign paroxysmal vertigo, bilateral: Secondary | ICD-10-CM | POA: Diagnosis not present

## 2021-01-27 DIAGNOSIS — H8113 Benign paroxysmal vertigo, bilateral: Secondary | ICD-10-CM | POA: Diagnosis not present

## 2021-02-02 DIAGNOSIS — H8113 Benign paroxysmal vertigo, bilateral: Secondary | ICD-10-CM | POA: Diagnosis not present

## 2021-02-08 ENCOUNTER — Other Ambulatory Visit: Payer: Self-pay

## 2021-02-08 ENCOUNTER — Ambulatory Visit (INDEPENDENT_AMBULATORY_CARE_PROVIDER_SITE_OTHER): Payer: BC Managed Care – PPO

## 2021-02-08 DIAGNOSIS — Z23 Encounter for immunization: Secondary | ICD-10-CM | POA: Diagnosis not present

## 2021-02-08 NOTE — Progress Notes (Signed)
Per orders of Dr. Claiborne Billings Pt is here for flu vaccine, pt received injection in the right deltoid, given IM by Dominic Pea, CMA. Pt tolerated injection well. Pt was notified to report any adverse reactions to me immediately. Sw, cma

## 2021-02-09 DIAGNOSIS — H8113 Benign paroxysmal vertigo, bilateral: Secondary | ICD-10-CM | POA: Diagnosis not present

## 2021-03-14 ENCOUNTER — Ambulatory Visit (INDEPENDENT_AMBULATORY_CARE_PROVIDER_SITE_OTHER): Payer: BC Managed Care – PPO | Admitting: Family Medicine

## 2021-03-14 ENCOUNTER — Encounter: Payer: Self-pay | Admitting: Family Medicine

## 2021-03-14 ENCOUNTER — Other Ambulatory Visit: Payer: Self-pay

## 2021-03-14 VITALS — BP 99/67 | HR 68 | Temp 97.8°F | Ht 68.0 in | Wt 263.0 lb

## 2021-03-14 DIAGNOSIS — J301 Allergic rhinitis due to pollen: Secondary | ICD-10-CM

## 2021-03-14 DIAGNOSIS — J4540 Moderate persistent asthma, uncomplicated: Secondary | ICD-10-CM | POA: Diagnosis not present

## 2021-03-14 DIAGNOSIS — R110 Nausea: Secondary | ICD-10-CM | POA: Diagnosis not present

## 2021-03-14 DIAGNOSIS — I10 Essential (primary) hypertension: Secondary | ICD-10-CM

## 2021-03-14 DIAGNOSIS — E782 Mixed hyperlipidemia: Secondary | ICD-10-CM

## 2021-03-14 DIAGNOSIS — R42 Dizziness and giddiness: Secondary | ICD-10-CM

## 2021-03-14 DIAGNOSIS — E213 Hyperparathyroidism, unspecified: Secondary | ICD-10-CM

## 2021-03-14 DIAGNOSIS — I252 Old myocardial infarction: Secondary | ICD-10-CM

## 2021-03-14 DIAGNOSIS — R270 Ataxia, unspecified: Secondary | ICD-10-CM

## 2021-03-14 LAB — COMPREHENSIVE METABOLIC PANEL
ALT: 19 U/L (ref 0–35)
AST: 18 U/L (ref 0–37)
Albumin: 4.1 g/dL (ref 3.5–5.2)
Alkaline Phosphatase: 62 U/L (ref 39–117)
BUN: 19 mg/dL (ref 6–23)
CO2: 26 mEq/L (ref 19–32)
Calcium: 10.7 mg/dL — ABNORMAL HIGH (ref 8.4–10.5)
Chloride: 108 mEq/L (ref 96–112)
Creatinine, Ser: 0.57 mg/dL (ref 0.40–1.20)
GFR: 94.93 mL/min (ref >60.00)
Glucose, Bld: 82 mg/dL (ref 70–99)
Potassium: 4.4 mEq/L (ref 3.5–5.1)
Sodium: 141 mEq/L (ref 135–145)
Total Bilirubin: 0.5 mg/dL (ref 0.2–1.2)
Total Protein: 6.3 g/dL (ref 6.0–8.3)

## 2021-03-14 LAB — TSH: TSH: 2.35 u[IU]/mL (ref 0.35–5.50)

## 2021-03-14 LAB — CBC
HCT: 42.8 % (ref 36.0–46.0)
Hemoglobin: 13.9 g/dL (ref 12.0–15.0)
MCHC: 32.5 g/dL (ref 30.0–36.0)
MCV: 92 fl (ref 78.0–100.0)
Platelets: 217 10*3/uL (ref 150.0–400.0)
RBC: 4.66 Mil/uL (ref 3.87–5.11)
RDW: 13.3 % (ref 11.5–15.5)
WBC: 5.7 10*3/uL (ref 4.0–10.5)

## 2021-03-14 LAB — LDL CHOLESTEROL, DIRECT: Direct LDL: 59 mg/dL

## 2021-03-14 MED ORDER — LEVOCETIRIZINE DIHYDROCHLORIDE 5 MG PO TABS: 5.0000 mg | ORAL_TABLET | Freq: Every evening | ORAL | 3 refills | Status: DC

## 2021-03-14 MED ORDER — ALBUTEROL SULFATE HFA 108 (90 BASE) MCG/ACT IN AERS: 1.0000 | INHALATION_SPRAY | Freq: Four times a day (QID) | RESPIRATORY_TRACT | 11 refills | Status: DC | PRN

## 2021-03-14 MED ORDER — MECLIZINE HCL 25 MG PO TABS: 25.0000 mg | ORAL_TABLET | Freq: Three times a day (TID) | ORAL | 3 refills | Status: DC | PRN

## 2021-03-14 MED ORDER — MOMETASONE FUROATE 50 MCG/ACT NA SUSP: 2.0000 | Freq: Every morning | NASAL | 5 refills | Status: DC

## 2021-03-14 MED ORDER — ONDANSETRON HCL 4 MG PO TABS: 4.0000 mg | ORAL_TABLET | Freq: Three times a day (TID) | ORAL | 11 refills | Status: DC | PRN

## 2021-03-14 MED ORDER — BUDESONIDE-FORMOTEROL FUMARATE 160-4.5 MCG/ACT IN AERO
2.0000 | INHALATION_SPRAY | Freq: Two times a day (BID) | RESPIRATORY_TRACT | 1 refills | Status: DC
Start: 2021-03-14 — End: 2021-10-31

## 2021-03-14 MED ORDER — MONTELUKAST SODIUM 10 MG PO TABS: 10.0000 mg | ORAL_TABLET | Freq: Every day | ORAL | 3 refills | Status: DC

## 2021-03-14 NOTE — Progress Notes (Signed)
This visit occurred during the SARS-CoV-2 public health emergency.  Safety protocols were in place, including screening questions prior to the visit, additional usage of staff PPE, and extensive cleaning of exam room while observing appropriate contact time as indicated for disinfecting solutions.    Alyssa Phillips , 04/01/1956, 65 y.o., female MRN: [id number] Patient Care Team    Relationship Specialty Notifications Start End  Ma Hillock, DO PCP - General Family Medicine  12/04/17   Teena Irani, MD (Inactive) Consulting Physician Gastroenterology  12/04/17   Gaynelle Arabian, MD Consulting Physician Orthopedic Surgery  12/04/17   Mosetta Anis, MD Referring Physician Allergy  12/12/19   Martinique, Peter M, MD Consulting Physician Cardiology  12/12/19   Shamleffer, Melanie Crazier, MD Consulting Physician Endocrinology  12/12/19     Chief Complaint  Patient presents with   Asthma    CMC; pt is not fasting     Subjective: Alyssa Phillips is a 65 y.o. female present for cmc follow up. Moderate persistent asthma without complication Patient reports her asthma and allergies have been stable.  She is compliant with Symbicort, albuterol as needed, Xyzal nightly, Singulair nightly and Nasonex.  Vertigo: She is prescribed meclizine and Zofran for her vertigo.  She has now seen ENT and vestibular rehab.  Her vertigo was from December 2021 to May 2022.  It then resolved with with vestibular rehab.  Reoccurred again in September for a few weeks.   Primary hypertension/morbid obesity/hyperlipidemia/history of STEMI Patient was admitted for anterior STEMI 11/01/2017 an is under the care of cardiology Dr. Martinique.  She was found to have severe single-vessel disease but 95% proximal LAD treated with DES x1.  EF 45 to 50%. Medications are managed by cardiology.  Pt reports compliance with  ASA 81, Lipitor, metoprolol, candesartan. Blood pressures ranges at home within normal limits. Patient denies  chest pain, shortness of breath, dizziness or lower extremity edema.  Diet: has changed to a heart healthy diet.  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  Levels have been stable.  Patient does ensure she is hydrating well.   Depression screen [facility] 2/9 03/14/2021 12/11/2019 12/09/2018 04/08/2018 01/07/2018  Decreased Interest 0 0 0 0 0  Down, Depressed, Hopeless 0 0 0 0 0  PHQ - 2 Score 0 0 0 0 0  Altered sleeping - 0 - - -  Tired, decreased energy - 0 - - -  Change in appetite - 0 - - -  Feeling bad or failure about yourself  - 0 - - -  Trouble concentrating - 0 - - -  Moving slowly or fidgety/restless - 0 - - -  Suicidal thoughts - 0 - - -  PHQ-9 Score - 0 - - -    Allergies  Allergen Reactions   Compazine [Prochlorperazine Edisylate] Other (See Comments)    Eyes rolled back, tongue curled up   Advil [Ibuprofen] Hives   Aspirin Other (See Comments)    HAS to be EC aspirin!!   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 RTKA   Asthma    environmental agents induced asthma   Carpal tunnel syndrome of left wrist 02/19/2020   Carpal tunnel syndrome of right wrist 02/19/2020   Coronary artery disease    Family history of adverse reaction to anesthesia    daughter very sensitive to anesthesia   GERD (gastroesophageal reflux disease)    Heart murmur    hx. mitral valve proplapse-mostly asymptomatic-->not apprectiaed n echo   Hepatic steatosis 03/05/2008   [place]   Hyperlipidemia    Hypertension    Morbid obesity due to excess calories (HCC)    Pain in  joint of left shoulder 06/12/2017   Pain in left foot 04/04/2018   Pain of joint of left ankle and foot 12/18/2019   STEMI (ST elevation myocardial infarction) ([place]) 10/2017   LAD treated with DESx1   UTI (urinary tract infection)    Vitamin D deficiency    Past Surgical History:  Procedure Laterality Date   5th finger surgery Right 1990   unknown injury   CARDIAC CATHETERIZATION     CARPAL TUNNEL RELEASE  08/2020   CHOLECYSTECTOMY     COLONOSCOPY WITH PROPOFOL N/A 06/08/2016   Procedure: COLONOSCOPY WITH PROPOFOL;  Surgeon: Teena Irani, MD;  Location: [facility] END[facility]COPY;  Service: Endoscopy;  Laterality: N/A;   CORONARY STENT INTERVENTION N/A 11/01/2017   Procedure: CORONARY STENT INTERVENTION;  Surgeon: Martinique, Peter M, MD;  Location: [place] CV LAB;  Service: Cardiovascular;  Laterality: N/A;  Resolute Onyx 4.5 mm x12 mm   CORONARY/GRAFT ACUTE MI REVASCULARIZATION N/A 11/01/2017   Procedure: Coronary/Graft Acute MI Revascularization;  Surgeon: Martinique, Peter M, MD;  Location: [place] CV [facility];  Service: Cardiovascular;  Laterality: N/A;   KNEE ARTHROSCOPY  05/02/2012   x2 right/ x1 left   LEFT HEART CATH AND CORONARY ANGIOGRAPHY N/A 11/01/2017   Procedure: LEFT HEART CATH AND CORONARY ANGIOGRAPHY;  Surgeon: Martinique, Peter M, MD;  Location: [place] [facility];  Service: Cardiovascular;  Laterality: N/A;   REPLACEMENT TOTAL KNEE Right 10/2010   ROOT CANAL     TOTAL KNEE ARTHROPLASTY  05/10/2012   Procedure: TOTAL KNEE ARTHROPLASTY;  Surgeon: Gearlean Alf, MD;  Location: [facility];  Service: Orthopedics;  Laterality: Left;   TUBAL LIGATION     UMBILICAL HERNIA REPAIR     Family History  Problem Relation Age of Onset   Hypertension Mother    Arthritis Mother    Lymphoma Mother    Asthma Mother    Cancer Mother    Hearing loss Mother    Miscarriages / [place] Mother    COPD Father    Alcohol abuse Father    Arthritis Father    Hearing loss Father    Hypertension Father     Miscarriages / [place] Sister    Arthritis Maternal Grandmother    Hearing loss Maternal Grandmother    Breast cancer Maternal Grandmother 77   Arthritis Maternal Grandfather    Diabetes Maternal Grandfather    Hypertension Maternal Grandfather    Stroke Maternal Grandfather    Arthritis Paternal Grandmother    Diabetes Paternal Grandmother    Hearing loss Paternal Grandmother    Hearing loss Paternal Grandfather    Learning disabilities Paternal Grandfather    Allergies as of 03/14/2021       Reactions   Compazine [prochlorperazine Edisylate] Other (See Comments)   Eyes rolled back, tongue curled up   Advil [ibuprofen] Hives   Aspirin Other (See Comments)  HAS to be EC aspirin!!        Medication List        Accurate as of March 14, 2021 12:02 PM. If you have any questions, ask your nurse or doctor.          STOP taking these medications    erythromycin ophthalmic ointment Stopped by: Howard Pouch, DO   methocarbamol 500 MG tablet Commonly known as: ROBAXIN Stopped by: Howard Pouch, DO       TAKE these medications    albuterol 108 (90 Base) MCG/ACT inhaler Commonly known as: VENTOLIN HFA Inhale 1-2 puffs into the lungs every 6 (six) hours as needed for wheezing or shortness of breath.   aspirin 81 MG EC tablet Take 1 tablet (81 mg total) by mouth daily.   atorvastatin 80 MG tablet Commonly known as: LIPITOR TAKE 1 TABLET (80 MG TOTAL) BY MOUTH DAILY AT 6 PM.   azelastine 0.05 % ophthalmic solution Commonly known as: OPTIVAR azelastine 0.05 % eye drops   B COMPLEX 1 PO Take by mouth.   budesonide-formoterol 160-4.5 MCG/ACT inhaler Commonly known as: SYMBICORT Inhale 2 puffs into the lungs 2 (two) times daily.   candesartan 4 MG tablet Commonly known as: ATACAND TAKE 1 TABLET BY MOUTH EVERY DAY   cholecalciferol 1000 units tablet Commonly known as: VITAMIN D Take 1,000 Units by mouth 2 (two) times daily.   clindamycin 150 MG  capsule Commonly known as: CLEOCIN Take by mouth.   diclofenac sodium 1 % Gel Commonly known as: VOLTAREN Apply 2-4 g topically daily as needed (as directed to painful sites).   levalbuterol 1.25 MG/3ML nebulizer solution Commonly known as: XOPENEX levalbuterol 1.25 mg/3 mL solution for nebulization  USE 1 VIAL VIA NEBULIZER 3 TIMES A DAY   levocetirizine 5 MG tablet Commonly known as: XYZAL Take 1 tablet (5 mg total) by mouth every evening.   meclizine 25 MG tablet Commonly known as: ANTIVERT Take 1 tablet (25 mg total) by mouth 3 (three) times daily as needed for dizziness.   metoprolol succinate 50 MG 24 hr tablet Commonly known as: TOPROL-XL TAKE 1 TABLET BY MOUTH EVERY DAY WITH OR IMMEDIATELY FOLLOWING A MEAL   mometasone 50 MCG/ACT nasal spray Commonly known as: NASONEX Place 2 sprays into the nose every morning.   montelukast 10 MG tablet Commonly known as: SINGULAIR Take 1 tablet (10 mg total) by mouth at bedtime.   multivitamin tablet Take 1 tablet by mouth daily.   nitroGLYCERIN 0.4 MG SL tablet Commonly known as: Nitrostat Place 1 tablet (0.4 mg total) under the tongue every 5 (five) minutes as needed for chest pain.   ondansetron 4 MG tablet Commonly known as: ZOFRAN Take 1 tablet (4 mg total) by mouth every 8 (eight) hours as needed for nausea or vomiting.   traMADol 50 MG tablet Commonly known as: ULTRAM Take 50 mg by mouth every 6 (six) hours as needed.        All past medical history, surgical history, allergies, family history, immunizations andmedications were updated in the EMR today and reviewed under the history and medication portions of their EMR.     ROS: Negative, with the exception of above mentioned in HPI   Objective:  BP 99/67   Pulse 68   Temp 97.8 F (36.6 C) (Oral)   Ht _0  (1.727 m)   Wt 263 lb (119.3 kg)   SpO2 95%   BMI 39.99 kg/m  Body mass index is 39.99 kg/m.  Gen: Afebrile. No acute distress. Nontoxic in  appearance, well developed, well nourished.  HENT: AT. [place]. Bilateral TM visualized without erythema or effusions. MMM, no oral lesions. Bilateral nares without erythema. Throat without erythema or exudates. No cough. No hoarseness. Eyes:Pupils Equal Round Reactive to light, Extraocular movements intact,  Conjunctiva without redness, discharge or icterus. Neck/lymp/endocrine: Supple, no lymphadenopathy CV: RRR, no edema Chest: CTAB, no wheeze or crackles. Good air movement, normal resp effort.  Skin: no rashes, purpura or petechiae.  Neuro: Ambulatory-with assistance on holding wall and walking very slowly. PERLA. EOMi. Alert. Oriented x3 Cranial nerves II through XII intact. Muscle strength 5/5 bilateral extremity. DTRs equal bilaterally.  -Positive Dix-Hallpike test with right head turn-recurrent symptoms. Psych: Normal affect, dress and demeanor. Normal speech. Normal thought content and judgment.  No results found. No results found. No results found for this or any previous visit (from the past 24 hour(s)).  Assessment/Plan: Alyssa Phillips is a 65 y.o. female present for OV for  mixed hyperlipidemia/history of STEMI involving left anterior descending coronary artery (HCC)/morbid obesity Stable- CBC - Comp Met (CMET) - TSH - Direct LDL-patient is not fasting -CBC Routine diet and exercise. Continue follow-ups with cardiology who manages all medications for cardiac condition-metoprolol, candesartan, Lipitor, baby aspirin, nitro  Moderate persistent asthma without complication/allergic rhinitis Stable. Continue Xyzal Continue Singulair Continue allergy follow-ups Continue albuterol as needed Continue Nasonex   Hyperparathyroidism (HCC)/hypercalcemia - PTH, Intact and Calcium collected today -Okay not to continue following up with endocrine, as long as calcium/PTH levels are stable.  Patient agreed if levels increase in her calcium she will follow-up with endocrine. Patient  ports adequate hydration  Vertigo: Has been intermittent over the last 12 months. Most recently has been well controlled. Continue meclizine as needed Continue Zofran as needed  Reviewed expectations re: course of current medical issues. Discussed self-management of symptoms. Outlined signs and symptoms indicating need for more acute intervention. Patient verbalized understanding and all questions were answered. Patient received an After-Visit Summary.    Orders Placed This Encounter  Procedures   CBC   Comp Met (CMET)   TSH   PTH, Intact and Calcium   Direct LDL    Meds ordered this encounter  Medications   albuterol (VENTOLIN HFA) 108 (90 Base) MCG/ACT inhaler    Sig: Inhale 1-2 puffs into the lungs every 6 (six) hours as needed for wheezing or shortness of breath.    Dispense:  6 g    Refill:  11   ondansetron (ZOFRAN) 4 MG tablet    Sig: Take 1 tablet (4 mg total) by mouth every 8 (eight) hours as needed for nausea or vomiting.    Dispense:  30 tablet    Refill:  11   mometasone (NASONEX) 50 MCG/ACT nasal spray    Sig: Place 2 sprays into the nose every morning.    Dispense:  17 g    Refill:  5   levocetirizine (XYZAL) 5 MG tablet    Sig: Take 1 tablet (5 mg total) by mouth every evening.    Dispense:  90 tablet    Refill:  3   meclizine (ANTIVERT) 25 MG tablet    Sig: Take 1 tablet (25 mg total) by mouth 3 (three) times daily as needed for dizziness.    Dispense:  90 tablet    Refill:  3   budesonide-formoterol (SYMBICORT) 160-4.5 MCG/ACT inhaler    Sig: Inhale 2 puffs into the lungs 2 (two) times daily.  Dispense:  3 each    Refill:  1   montelukast (SINGULAIR) 10 MG tablet    Sig: Take 1 tablet (10 mg total) by mouth at bedtime.    Dispense:  90 tablet    Refill:  3    Referral Orders  No referral(s) requested today     Note is dictated utilizing voice recognition software. Although note has been proof read prior to signing, occasional  typographical errors still can be missed. If any questions arise, please do not hesitate to call for verification.   electronically signed by:  Howard Pouch, DO  [place]

## 2021-03-15 ENCOUNTER — Telehealth: Payer: Self-pay | Admitting: Family Medicine

## 2021-03-15 LAB — PTH, INTACT AND CALCIUM
Calcium: 10.7 mg/dL — ABNORMAL HIGH (ref 8.6–10.4)
PTH: 91 pg/mL — ABNORMAL HIGH (ref 16–77)

## 2021-03-15 NOTE — Telephone Encounter (Signed)
Please inform patient her calcium levels are stable and her parathyroid hormone is mildly elevated again, but stable from prior collections.

## 2021-03-15 NOTE — Telephone Encounter (Signed)
Spoke with pt regarding results/recommendations,voiced understanding. ? ?

## 2021-03-23 ENCOUNTER — Ambulatory Visit: Payer: BC Managed Care – PPO

## 2021-03-23 ENCOUNTER — Encounter: Payer: Self-pay | Admitting: Family Medicine

## 2021-03-23 ENCOUNTER — Other Ambulatory Visit: Payer: Self-pay

## 2021-03-23 ENCOUNTER — Telehealth (INDEPENDENT_AMBULATORY_CARE_PROVIDER_SITE_OTHER): Payer: BC Managed Care – PPO | Admitting: Family Medicine

## 2021-03-23 ENCOUNTER — Telehealth: Payer: Self-pay

## 2021-03-23 VITALS — Temp 98.6°F

## 2021-03-23 DIAGNOSIS — R051 Acute cough: Secondary | ICD-10-CM | POA: Diagnosis not present

## 2021-03-23 DIAGNOSIS — B349 Viral infection, unspecified: Secondary | ICD-10-CM | POA: Diagnosis not present

## 2021-03-23 MED ORDER — OSELTAMIVIR PHOSPHATE 75 MG PO CAPS: 75.0000 mg | ORAL_CAPSULE | Freq: Two times a day (BID) | ORAL | 0 refills | Status: DC

## 2021-03-23 NOTE — Progress Notes (Signed)
VIRTUAL VISIT VIA VIDEO  I connected with Alyssa Phillips on 03/23/21 at 10:00 AM EST by a video enabled telemedicine application and verified that I am speaking with the correct person using two identifiers. Location patient: Home Location provider: [facility], Office Persons participating in the virtual visit: Patient, Dr. Claiborne Billings and Les Pou, CMA  I discussed the limitations of evaluation and management by telemedicine and the availability of in person appointments. The patient expressed understanding and agreed to proceed.  Alyssa Phillips , 1955-10-05, 65 y.o., female MRN: [id number] Patient Care Team    Relationship Specialty Notifications Start End  Natalia Leatherwood, DO PCP - General Family Medicine  12/04/17   Dorena Cookey, MD (Inactive) Consulting Physician Gastroenterology  12/04/17   Ollen Gross, MD Consulting Physician Orthopedic Surgery  12/04/17   Sidney Ace, MD Referring Physician Allergy  12/12/19   [place], Peter M, MD Consulting Physician Cardiology  12/12/19   Shamleffer, Konrad Dolores, MD Consulting Physician Endocrinology  12/12/19     Chief Complaint  Patient presents with   Cough    Pt c/o productive cough with light yellowish tint, sore throat, runny nose, HA x 2 days; 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  Associated symptoms include cough, phlegm prdx, sore throat, runny nose and headache. She has been exposed to influenza A at her work. She is vaccinated and UTD. She has not taken a covid test. Last covid vaccine 2021.. She denies fever, shills, nausea, vomit or diarrhea.  Pt has tried nothing to ease their symptoms. She is concerned she is infectious and is fearful of she will give illness to her family next week- she is going to see her newborn grandchild BMP Latest Ref Rng & Units 03/14/2021 03/14/2021  08/02/2020  Glucose 70 - 99 mg/dL - 82 76  BUN 6 - 23 mg/dL - 19 18  Creatinine 3.71 - 1.20 mg/dL - 0.62 6.94  BUN/Creat Ratio 6 - 22 (calc) - - -  Sodium 135 - 145 mEq/L - 141 142  Potassium 3.5 - 5.1 mEq/L - 4.4 3.9  Chloride 96 - 112 mEq/L - 108 109  CO2 19 - 32 mEq/L - 26 27  Calcium 8.6 - 10.4 mg/dL 10.7(H) 10.7(H) 10.3    Immunization History  Administered Date(s) Administered   Fluad Quad(high Dose 65+) 02/08/2021   Influenza Split 05/19/2008, 02/01/2010, 01/31/2011, 01/30/2012   Influenza, High Dose Seasonal PF 01/29/2018, 02/10/2019   Influenza,inj,Quad PF,6+ Mos 03/10/2015, 12/22/2016, 12/04/2017, 12/09/2018, 12/11/2019   Influenza,inj,Quad PF,6-35 Mos 05/15/2016   Influenza,inj,quad, With Preservative 01/01/2014, 12/18/2016   Influenza-Unspecified 05/15/2016   PFIZER(Purple Top)SARS-COV-2 Vaccination 06/14/2019, 07/09/2019, 11/03/2019, 01/20/2020   Pneumococcal Polysaccharide-23 12/04/2017, 01/29/2018   Tdap 05/13/2010, 12/09/2018   Zoster Recombinat (Shingrix) 12/11/2019   Depression screen PHQ 2/9 03/14/2021 12/11/2019 12/09/2018 04/08/2018 01/07/2018  Decreased Interest 0 0 0 0 0  Down, Depressed, Hopeless 0 0 0 0 0  PHQ - 2 Score 0 0 0 0 0  Altered sleeping - 0 - - -  Tired, decreased energy - 0 - - -  Change in appetite - 0 - - -  Feeling bad or failure about yourself  - 0 - - -  Trouble concentrating - 0 - - -  Moving slowly or fidgety/restless - 0 - - -  Suicidal thoughts - 0 - - -  PHQ-9 Score - 0 - - -   Allergies  Allergen Reactions   Compazine [Prochlorperazine Edisylate] Other (See Comments)    Eyes rolled back, tongue curled up   Advil [Ibuprofen] Hives   Aspirin Other (See Comments)    HAS to be EC aspirin!!   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 RTKA   Asthma    environmental agents induced asthma   Carpal tunnel syndrome of left wrist 02/19/2020   Carpal tunnel syndrome of right wrist 02/19/2020   Coronary artery disease    Family history of adverse reaction to anesthesia    daughter very sensitive to anesthesia   GERD (gastroesophageal reflux disease)    Heart murmur    hx. mitral valve proplapse-mostly asymptomatic-->not apprectiaed n echo   Hepatic steatosis 03/05/2008   [place]   Hyperlipidemia    Hypertension    Morbid obesity due to excess calories (HCC)    Pain in joint of left shoulder 06/12/2017   Pain in left foot 04/04/2018   Pain of joint of left ankle and foot 12/18/2019   STEMI (ST elevation myocardial infarction) (HCC) 10/2017   LAD treated with DESx1   UTI (urinary tract infection)    Vitamin D deficiency    Past Surgical History:  Procedure Laterality Date   5th finger surgery Right 1990   unknown injury   CARDIAC CATHETERIZATION     CARPAL TUNNEL RELEASE  08/2020   CHOLECYSTECTOMY     COLONOSCOPY WITH PROPOFOL N/A 06/08/2016   Procedure: COLONOSCOPY WITH PROPOFOL;  Surgeon: Dorena Cookey, MD;  Location: [facility] ENDOSCOPY;  Service: Endoscopy;  Laterality: N/A;   CORONARY STENT INTERVENTION N/A 11/01/2017   Procedure: CORONARY STENT INTERVENTION;  Surgeon: Swaziland, Peter M, MD;  Location: [facility] INVASIVE CV LAB;  Service: Cardiovascular;  Laterality: N/A;  Resolute Onyx 4.5 mm x12 mm   CORONARY/GRAFT ACUTE MI REVASCULARIZATION N/A 11/01/2017   Procedure: Coronary/Graft Acute MI Revascularization;  Surgeon: [place]il[place], Peter M, MD;  Location: [facility] INVASIVE CV LAB;  Service: Cardiovascular;  Laterality: N/A;   KNEE ARTHROSCOPY  05/02/2012   x2 right/ x1 left   LEFT HEART CATH AND CORONARY ANGIOGRAPHY N/A 11/01/2017   Procedure: LEFT HEART CATH AND CORONARY ANGIOGRAPHY;  Surgeon: [place], Peter M, MD;   Location: [facility] INVASIVE CV LAB;  Service: Cardiovascular;  Laterality: N/A;   REPLACEMENT TOTAL KNEE Right 10/2010   ROOT CANAL     TOTAL KNEE ARTHROPLASTY  05/10/2012   Procedure: TOTAL KNEE ARTHROPLASTY;  Surgeon: Loanne Drilling, MD;  Location: [facility] ORS;  Service: Orthopedics;  Laterality: Left;   TUBAL LIGATION     UMBILICAL HERNIA REPAIR     Family History  Problem Relation Age of Onset   Hypertension Mother    Arthritis Mother    Lymphoma Mother    Asthma Mother    Cancer Mother    Hearing loss Mother    Miscarriages / [place] Mother    COPD Father    Alcohol abuse Father    Arthritis Father    Hearing loss Father    Hypertension  Father    Miscarriages / [place] Sister    Arthritis Maternal Grandmother    Hearing loss Maternal Grandmother    Breast cancer Maternal Grandmother 59   Arthritis Maternal Grandfather    Diabetes Maternal Grandfather    Hypertension Maternal Grandfather    Stroke Maternal Grandfather    Arthritis Paternal Grandmother    Diabetes Paternal Grandmother    Hearing loss Paternal Grandmother    Hearing loss Paternal Grandfather    Learning disabilities Paternal Grandfather    Allergies as of 03/23/2021       Reactions   Compazine [prochlorperazine Edisylate] Other (See Comments)   Eyes rolled back, tongue curled up   Advil [ibuprofen] Hives   Aspirin Other (See Comments)   HAS to be EC aspirin!!        Medication List        Accurate as of March 23, 2021 10:09 AM. If you have any questions, ask your nurse or doctor.          STOP taking these medications    clindamycin 150 MG capsule Commonly known as: CLEOCIN Stopped by: Felix Pacini, DO       TAKE these medications    albuterol 108 (90 Base) MCG/ACT inhaler Commonly known as: VENTOLIN HFA Inhale 1-2 puffs into the lungs every 6 (six) hours as needed for wheezing or shortness of breath.   aspirin 81 MG EC tablet Take 1 tablet (81 mg total) by mouth daily.    atorvastatin 80 MG tablet Commonly known as: LIPITOR TAKE 1 TABLET (80 MG TOTAL) BY MOUTH DAILY AT 6 PM.   azelastine 0.05 % ophthalmic solution Commonly known as: OPTIVAR azelastine 0.05 % eye drops   B COMPLEX 1 PO Take by mouth.   budesonide-formoterol 160-4.5 MCG/ACT inhaler Commonly known as: SYMBICORT Inhale 2 puffs into the lungs 2 (two) times daily.   candesartan 4 MG tablet Commonly known as: ATACAND TAKE 1 TABLET BY MOUTH EVERY DAY   cholecalciferol 1000 units tablet Commonly known as: VITAMIN D Take 1,000 Units by mouth 2 (two) times daily.   diclofenac sodium 1 % Gel Commonly known as: VOLTAREN Apply 2-4 g topically daily as needed (as directed to painful sites).   levalbuterol 1.25 MG/3ML nebulizer solution Commonly known as: XOPENEX levalbuterol 1.25 mg/3 mL solution for nebulization  USE 1 VIAL VIA NEBULIZER 3 TIMES A DAY   levocetirizine 5 MG tablet Commonly known as: XYZAL Take 1 tablet (5 mg total) by mouth every evening.   meclizine 25 MG tablet Commonly known as: ANTIVERT Take 1 tablet (25 mg total) by mouth 3 (three) times daily as needed for dizziness.   metoprolol succinate 50 MG 24 hr tablet Commonly known as: TOPROL-XL TAKE 1 TABLET BY MOUTH EVERY DAY WITH OR IMMEDIATELY FOLLOWING A MEAL   mometasone 50 MCG/ACT nasal spray Commonly known as: NASONEX Place 2 sprays into the nose every morning.   montelukast 10 MG tablet Commonly known as: SINGULAIR Take 1 tablet (10 mg total) by mouth at bedtime.   multivitamin tablet Take 1 tablet by mouth daily.   nitroGLYCERIN 0.4 MG SL tablet Commonly known as: Nitrostat Place 1 tablet (0.4 mg total) under the tongue every 5 (five) minutes as needed for chest pain.   ondansetron 4 MG tablet Commonly known as: ZOFRAN Take 1 tablet (4 mg total) by mouth every 8 (eight) hours as needed for nausea or vomiting.   oseltamivir 75 MG capsule Commonly known as: Tamiflu Take 1 capsule (  75 mg total)  by mouth 2 (two) times daily. Started by: Felix Pacini, DO   traMADol 50 MG tablet Commonly known as: ULTRAM Take 50 mg by mouth every 6 (six) hours as needed.        All past medical history, surgical history, allergies, family history, immunizations andmedications were updated in the EMR today and reviewed under the history and medication portions of their EMR.     ROS Negative, with the exception of above mentioned in HPI   Objective:  Temp 98.6 F (37 C) (Axillary)  There is no height or weight on file to calculate BMI.  Physical Exam Constitutional:      General: She is not in acute distress.    Appearance: Normal appearance. She is not ill-appearing or toxic-appearing.  Eyes:     General: No scleral icterus.       Right eye: No discharge.        Left eye: No discharge.     Conjunctiva/sclera: Conjunctivae normal.  Pulmonary:     Effort: Pulmonary effort is normal.  Musculoskeletal:     Cervical back: Normal range of motion.  Skin:    Findings: No rash.  Neurological:     General: No focal deficit present.     Mental Status: She is alert and oriented to person, place, and time. Mental status is at baseline.  Psychiatric:        Behavior: Behavior normal.     No results found. No results found. No results found for this or any previous visit (from the past 24 hour(s)).  Assessment/Plan: ADDALIE CALLES is a 65 y.o. female present for OV for  Viral illness/Acute cough - COVID-19, Flu A+B and RSV; Future> arranging drive-thru testing here for her.  Rest, hydrate.  +/- flonase, mucinex (DM if cough), nettie pot or nasal saline.  Elected to start tamiflu since known exposure and still w/in window for tx, while we wait on results.  Will re-address, tx plan after results received if needed.  F/U 2 weeks of not improved.   Reviewed expectations re: course of current medical issues. Discussed self-management of symptoms. Outlined signs and symptoms indicating  need for more acute intervention. Patient verbalized understanding and all questions were answered. Patient received an After-Visit Summary.    Orders Placed This Encounter  Procedures   COVID-19, Flu A+B and RSV   Meds ordered this encounter  Medications   oseltamivir (TAMIFLU) 75 MG capsule    Sig: Take 1 capsule (75 mg total) by mouth 2 (two) times daily.    Dispense:  10 capsule    Refill:  0   Referral Orders  No referral(s) requested today     Note is dictated utilizing voice recognition software. Although note has been proof read prior to signing, occasional typographical errors still can be missed. If any questions arise, please do not hesitate to call for verification.   electronically signed by:  Felix Pacini, DO  [facility] [place]

## 2021-03-23 NOTE — Telephone Encounter (Signed)
Pt has VV appt scheduled with PCP  [facility] Day - Client TELEPHONE ADVICE RECORD Access[employer]urse Patient Name: Alyssa Phillips Gender: Female DOB: 1955-12-10 Age: 65 Y 1 M 7 D Return Phone Number: [phone] (Primary) Address: City/ State/ Zip: [place]  [address] Client [facility] Day - Client Client Site  [facility] - Day Provider Claiborne Billings, [place] Contact Type Call Who Is Calling Patient / Member / Family / Caregiver Call Type Triage / Clinical Relationship To Patient Self Return Phone Number [phone] (Primary) Chief Complaint Headache Reason for Call Symptomatic / Request for Health Information Initial Comment Caller has had headache and runny nose and asks to be seen/tested. Translation No Nurse Assessment Nurse: Loistine Simas, RN, Lannette Donath Date/Time (Eastern Time): 03/23/2021 7:16:21 AM Confirm and document reason for call. If symptomatic, describe symptoms. ---Caller has had headache, runny nose, cough, and chest congestion. Symptoms began 2 nights ago. Denies fever. Does the patient have any new or worsening symptoms? ---Yes Will a triage be completed? ---Yes Related visit to physician within the last 2 weeks? ---No Does the PT have any chronic conditions? (i.e. diabetes, asthma, this includes High risk factors for pregnancy, etc.) ---Yes List chronic conditions. ---asthma, hx of MI Is this a behavioral health or substance abuse call? ---No Guidelines Guideline Title Affirmed Question Affirmed Notes Nurse Date/Time (Eastern Time) Cough - Acute Productive Cough with cold symptoms (e.g., runny nose, postnasal drip, throat clearing) Hearon, RN, Lannette Donath 03/23/2021 7:18:41 AM Disp. Time Lamount Cohen Time) Disposition Final User 03/23/2021 7:22:22 AM Home Care Yes Hearon, RN, Lannette Donath PLEASE NOTE: All timestamps contained within this report are represented as [place] Standard Time. CONFIDENTIALTY NOTICE: This  fax transmission is intended only for the addressee. It contains information that is legally privileged, confidential or otherwise protected from use or disclosure. If you are not the intended recipient, you are strictly prohibited from reviewing, disclosing, copying using or disseminating any of this information or taking any action in reliance on or regarding this information. If you have received this fax in error, please notify us immediately by telephone so that we can arrange for its return to [place]. Phone: [phone], Toll-Free: [phone], Fax: [phone] Page: 2 of 2 Call Id: [id number] Caller Disagree/Comply Comply Caller Understands Yes PreDisposition Did not know what to do Care Advice Given Per Guideline HOME CARE: * You should be able to treat this at home. CARE ADVICE given per Cough - Acute Productive (Adult) guideline. * It sounds like an uncomplicated cold that we can treat at home. REASSURANCE AND EDUCATION - COUGH WITH COMMON COLD SYMPTOMS: * You become worse CALL BACK IF

## 2021-03-24 LAB — COVID-19, FLU A+B AND RSV
Influenza A, NAA: DETECTED — AB
Influenza B, NAA: NOT DETECTED
RSV, NAA: NOT DETECTED
SARS-CoV-2, NAA: NOT DETECTED

## 2021-04-14 ENCOUNTER — Other Ambulatory Visit: Payer: Self-pay | Admitting: Cardiology

## 2021-04-14 DIAGNOSIS — M19072 Primary osteoarthritis, left ankle and foot: Secondary | ICD-10-CM | POA: Diagnosis not present

## 2021-06-16 ENCOUNTER — Other Ambulatory Visit: Payer: Self-pay | Admitting: Cardiology

## 2021-06-17 DIAGNOSIS — E559 Vitamin D deficiency, unspecified: Secondary | ICD-10-CM | POA: Insufficient documentation

## 2021-06-17 DIAGNOSIS — J4520 Mild intermittent asthma, uncomplicated: Secondary | ICD-10-CM | POA: Insufficient documentation

## 2021-06-17 DIAGNOSIS — I341 Nonrheumatic mitral (valve) prolapse: Secondary | ICD-10-CM | POA: Insufficient documentation

## 2021-06-17 DIAGNOSIS — I213 ST elevation (STEMI) myocardial infarction of unspecified site: Secondary | ICD-10-CM | POA: Insufficient documentation

## 2021-06-17 DIAGNOSIS — Z6841 Body Mass Index (BMI) 40.0 and over, adult: Secondary | ICD-10-CM | POA: Insufficient documentation

## 2021-06-18 DIAGNOSIS — M25522 Pain in left elbow: Secondary | ICD-10-CM | POA: Diagnosis not present

## 2021-06-19 ENCOUNTER — Inpatient Hospital Stay (HOSPITAL_COMMUNITY)
Admission: EM | Admit: 2021-06-19 | Discharge: 2021-06-22 | DRG: 603 | Disposition: A | Discharge status: Home / Self Care | Payer: BC Managed Care – PPO | Attending: Family Medicine | Admitting: Family Medicine

## 2021-06-19 ENCOUNTER — Encounter (HOSPITAL_COMMUNITY): Payer: Self-pay | Admitting: Emergency Medicine

## 2021-06-19 ENCOUNTER — Other Ambulatory Visit: Payer: Self-pay

## 2021-06-19 DIAGNOSIS — M170 Bilateral primary osteoarthritis of knee: Secondary | ICD-10-CM | POA: Diagnosis present

## 2021-06-19 DIAGNOSIS — H8110 Benign paroxysmal vertigo, unspecified ear: Secondary | ICD-10-CM | POA: Diagnosis present

## 2021-06-19 DIAGNOSIS — E6609 Other obesity due to excess calories: Secondary | ICD-10-CM | POA: Diagnosis present

## 2021-06-19 DIAGNOSIS — Z20822 Contact with and (suspected) exposure to covid-19: Secondary | ICD-10-CM | POA: Diagnosis not present

## 2021-06-19 DIAGNOSIS — Z7982 Long term (current) use of aspirin: Secondary | ICD-10-CM

## 2021-06-19 DIAGNOSIS — Z9181 History of falling: Secondary | ICD-10-CM

## 2021-06-19 DIAGNOSIS — R21 Rash and other nonspecific skin eruption: Secondary | ICD-10-CM | POA: Diagnosis present

## 2021-06-19 DIAGNOSIS — I341 Nonrheumatic mitral (valve) prolapse: Secondary | ICD-10-CM | POA: Diagnosis present

## 2021-06-19 DIAGNOSIS — E559 Vitamin D deficiency, unspecified: Secondary | ICD-10-CM | POA: Diagnosis present

## 2021-06-19 DIAGNOSIS — Z79899 Other long term (current) drug therapy: Secondary | ICD-10-CM

## 2021-06-19 DIAGNOSIS — M25422 Effusion, left elbow: Secondary | ICD-10-CM | POA: Diagnosis not present

## 2021-06-19 DIAGNOSIS — Z8261 Family history of arthritis: Secondary | ICD-10-CM

## 2021-06-19 DIAGNOSIS — E785 Hyperlipidemia, unspecified: Secondary | ICD-10-CM | POA: Diagnosis not present

## 2021-06-19 DIAGNOSIS — Z886 Allergy status to analgesic agent status: Secondary | ICD-10-CM

## 2021-06-19 DIAGNOSIS — J4540 Moderate persistent asthma, uncomplicated: Secondary | ICD-10-CM | POA: Diagnosis present

## 2021-06-19 DIAGNOSIS — M25522 Pain in left elbow: Secondary | ICD-10-CM | POA: Diagnosis not present

## 2021-06-19 DIAGNOSIS — Z955 Presence of coronary angioplasty implant and graft: Secondary | ICD-10-CM | POA: Diagnosis not present

## 2021-06-19 DIAGNOSIS — I252 Old myocardial infarction: Secondary | ICD-10-CM

## 2021-06-19 DIAGNOSIS — E213 Hyperparathyroidism, unspecified: Secondary | ICD-10-CM | POA: Diagnosis not present

## 2021-06-19 DIAGNOSIS — L0390 Cellulitis, unspecified: Secondary | ICD-10-CM | POA: Diagnosis present

## 2021-06-19 DIAGNOSIS — Z87440 Personal history of urinary (tract) infections: Secondary | ICD-10-CM

## 2021-06-19 DIAGNOSIS — Z6839 Body mass index (BMI) 39.0-39.9, adult: Secondary | ICD-10-CM | POA: Diagnosis not present

## 2021-06-19 DIAGNOSIS — I10 Essential (primary) hypertension: Secondary | ICD-10-CM | POA: Diagnosis not present

## 2021-06-19 DIAGNOSIS — Z825 Family history of asthma and other chronic lower respiratory diseases: Secondary | ICD-10-CM

## 2021-06-19 DIAGNOSIS — K760 Fatty (change of) liver, not elsewhere classified: Secondary | ICD-10-CM | POA: Diagnosis present

## 2021-06-19 DIAGNOSIS — I2510 Atherosclerotic heart disease of native coronary artery without angina pectoris: Secondary | ICD-10-CM | POA: Diagnosis present

## 2021-06-19 DIAGNOSIS — H9190 Unspecified hearing loss, unspecified ear: Secondary | ICD-10-CM | POA: Diagnosis present

## 2021-06-19 DIAGNOSIS — L03114 Cellulitis of left upper limb: Principal | ICD-10-CM | POA: Diagnosis present

## 2021-06-19 DIAGNOSIS — H9313 Tinnitus, bilateral: Secondary | ICD-10-CM | POA: Diagnosis present

## 2021-06-19 DIAGNOSIS — Z7951 Long term (current) use of inhaled steroids: Secondary | ICD-10-CM

## 2021-06-19 DIAGNOSIS — K219 Gastro-esophageal reflux disease without esophagitis: Secondary | ICD-10-CM | POA: Diagnosis present

## 2021-06-19 DIAGNOSIS — Z8249 Family history of ischemic heart disease and other diseases of the circulatory system: Secondary | ICD-10-CM

## 2021-06-19 DIAGNOSIS — Z888 Allergy status to other drugs, medicaments and biological substances status: Secondary | ICD-10-CM

## 2021-06-19 LAB — BASIC METABOLIC PANEL
Anion gap: 6 (ref 5–15)
BUN: 10 mg/dL (ref 8–23)
CO2: 25 mmol/L (ref 22–32)
Calcium: 10.2 mg/dL (ref 8.9–10.3)
Chloride: 105 mmol/L (ref 98–111)
Creatinine, Ser: 0.67 mg/dL (ref 0.44–1.00)
GFR, Estimated: >60 mL/min (ref >60)
Glucose, Bld: 97 mg/dL (ref 70–99)
Potassium: 3.7 mmol/L (ref 3.5–5.1)
Sodium: 136 mmol/L (ref 135–145)

## 2021-06-19 LAB — CBC WITH DIFFERENTIAL/PLATELET
Abs Immature Granulocytes: 0.04 10*3/uL (ref 0.00–0.07)
Basophils Absolute: 0 10*3/uL (ref 0.0–0.1)
Basophils Relative: 0 %
Eosinophils Absolute: 0.1 10*3/uL (ref 0.0–0.5)
Eosinophils Relative: 1 %
HCT: 43.9 % (ref 36.0–46.0)
Hemoglobin: 13.8 g/dL (ref 12.0–15.0)
Immature Granulocytes: 0 %
Lymphocytes Relative: 15 %
Lymphs Abs: 1.5 10*3/uL (ref 0.7–4.0)
MCH: 29.8 pg (ref 26.0–34.0)
MCHC: 31.4 g/dL (ref 30.0–36.0)
MCV: 94.8 fL (ref 80.0–100.0)
Monocytes Absolute: 0.9 10*3/uL (ref 0.1–1.0)
Monocytes Relative: 9 %
Neutro Abs: 7.7 10*3/uL (ref 1.7–7.7)
Neutrophils Relative %: 75 %
Platelets: 172 10*3/uL (ref 150–400)
RBC: 4.63 MIL/uL (ref 3.87–5.11)
RDW: 13.2 % (ref 11.5–15.5)
WBC: 10.2 10*3/uL (ref 4.0–10.5)
nRBC: 0 % (ref 0.0–0.2)

## 2021-06-19 LAB — RESP PANEL BY RT-PCR (FLU A&B, COVID) ARPGX2
Influenza A by PCR: NEGATIVE
Influenza B by PCR: NEGATIVE
SARS Coronavirus 2 by RT PCR: NEGATIVE

## 2021-06-19 LAB — LACTIC ACID, PLASMA: Lactic Acid, Venous: 2 mmol/L (ref 0.5–1.9)

## 2021-06-19 MED ORDER — SODIUM CHLORIDE 0.9 % IV SOLN
2.0000 g | Freq: Once | INTRAVENOUS | Status: AC
  Administered 2021-06-19: 2 g via INTRAVENOUS
  Filled 2021-06-19: qty 2

## 2021-06-19 MED ORDER — SODIUM CHLORIDE 0.9 % IV SOLN: INTRAVENOUS | Status: DC

## 2021-06-19 MED ORDER — ACETAMINOPHEN 650 MG RE SUPP
650.0000 mg | Freq: Four times a day (QID) | RECTAL | Status: DC | PRN
Start: 2021-06-19 — End: 2021-06-22

## 2021-06-19 MED ORDER — ATORVASTATIN CALCIUM 80 MG PO TABS
80.0000 mg | ORAL_TABLET | Freq: Every day | ORAL | Status: DC
Start: 2021-06-20 — End: 2021-06-19
  Filled 2021-06-19: qty 1

## 2021-06-19 MED ORDER — VANCOMYCIN HCL IN DEXTROSE 1-5 GM/200ML-% IV SOLN
1000.0000 mg | Freq: Once | INTRAVENOUS | Status: AC
  Administered 2021-06-19: 1000 mg via INTRAVENOUS
  Filled 2021-06-19: qty 200

## 2021-06-19 MED ORDER — ACETAMINOPHEN 325 MG PO TABS
650.0000 mg | ORAL_TABLET | Freq: Four times a day (QID) | ORAL | Status: DC | PRN
  Administered 2021-06-19 – 2021-06-22 (×4): 650 mg via ORAL
  Filled 2021-06-19 (×4): qty 2

## 2021-06-19 MED ORDER — ALBUTEROL SULFATE (2.5 MG/3ML) 0.083% IN NEBU: 2.5000 mg | INHALATION_SOLUTION | RESPIRATORY_TRACT | Status: DC | PRN

## 2021-06-19 MED ORDER — ENOXAPARIN SODIUM 40 MG/0.4ML IJ SOSY
40.0000 mg | PREFILLED_SYRINGE | INTRAMUSCULAR | Status: DC
  Administered 2021-06-19: 40 mg via SUBCUTANEOUS
  Filled 2021-06-19 (×5): qty 0.4

## 2021-06-19 MED ORDER — CALCIUM CARBONATE ANTACID 500 MG PO CHEW
1.0000 | CHEWABLE_TABLET | Freq: Two times a day (BID) | ORAL | Status: DC
  Administered 2021-06-19 – 2021-06-21 (×5): 200 mg via ORAL
  Filled 2021-06-19 (×6): qty 1

## 2021-06-19 MED ORDER — SODIUM CHLORIDE 0.9 % IV BOLUS
1000.0000 mL | Freq: Once | INTRAVENOUS | Status: AC
  Administered 2021-06-19: 1000 mL via INTRAVENOUS

## 2021-06-19 MED ORDER — ATORVASTATIN CALCIUM 80 MG PO TABS
80.0000 mg | ORAL_TABLET | Freq: Every day | ORAL | Status: DC
Start: 2021-06-19 — End: 2021-06-22
  Administered 2021-06-19 – 2021-06-21 (×3): 80 mg via ORAL
  Filled 2021-06-19 (×2): qty 1

## 2021-06-19 MED ORDER — METOPROLOL SUCCINATE ER 50 MG PO TB24
50.0000 mg | ORAL_TABLET | Freq: Every day | ORAL | Status: DC
  Administered 2021-06-20 – 2021-06-21 (×2): 50 mg via ORAL
  Filled 2021-06-19 (×3): qty 1

## 2021-06-19 MED ORDER — IRBESARTAN 75 MG PO TABS
37.5000 mg | ORAL_TABLET | Freq: Every day | ORAL | Status: DC
Start: 2021-06-20 — End: 2021-06-22
  Administered 2021-06-20 – 2021-06-22 (×3): 37.5 mg via ORAL
  Filled 2021-06-19 (×3): qty 1

## 2021-06-19 MED ORDER — MONTELUKAST SODIUM 10 MG PO TABS
10.0000 mg | ORAL_TABLET | Freq: Every day | ORAL | Status: DC
  Administered 2021-06-19 – 2021-06-21 (×3): 10 mg via ORAL
  Filled 2021-06-19 (×3): qty 1

## 2021-06-19 NOTE — Plan of Care (Signed)
  Problem: Education: Goal: Knowledge of General Education information will improve Description: Including pain rating scale, medication(s)/side effects and non-pharmacologic comfort measures Outcome: Progressing   Problem: Activity: Goal: Risk for activity intolerance will decrease Outcome: Progressing   

## 2021-06-19 NOTE — ED Triage Notes (Signed)
Pt reports L arm cellulitis.  Seen by orthopedist yesterday and started on antibiotics but states it is worse today.  Denies pain.  States she woke up sweating this morning and felt like she had a fever. ?

## 2021-06-19 NOTE — H&P (Signed)
Family Medicine Teaching [facility] Admission History and Physical Service Pager: [phone]  Patient name: Alyssa Phillips Medical record number: [id number] Date of birth: October 15, 1955 Age: 66 y.o. Gender: female  Primary Care Provider: Natalia Leatherwood, DO Consultants: none Code Status: Full Preferred Emergency Contact: spouse, Quinton Overbeck  Chief Complaint: left elbow pain  Assessment and Plan: Alyssa Phillips is a 66 y.o. female presenting with cellulitis. PMH is significant for asthma, vertigo, HTN, obesity, HLD, h/o STEMI, hypercalcemia, hyperparathyroidism.  Cellulitis Patient presents with erythema, swelling of left elbow/forearm. She failed 1 day of po keflex. No known injury, no obvious cut. Patient has no known immunodeficiency (no history of DM). ED course: CMP WNL, CBC WNL, no leukocytosis or left shift. Fever to 100.4*F in ED. LA to 2. Blood cultures obtained. Flu/COVID negative. S/p 1L NS. Plan to treat with vanc, cefepime and transition to po abx when blood cultures available. Exam is strongly c/w cellulitis; UE DVT considered, but patient is low risk for this, and it is far less likely than infection given fever, physical exam, and LA. Geneva score 1 only due to age. -Admit to FPTS, attending Dr. Leveda Anna -Continue Cefepime, Vanc -NS mIVF -Follow blood cultures, de-escalate abx when able -Acetaminophen for fever, pain -AM CBC, BMP -Regular diet -Lovenox for DVT ppx -PT eval and treat  Asthma Patient has history of moderate persistent asthma. Home medication includes symbicort, albuterol, montelukast, xyzal, nasonex. She is not having any symptoms today. - albuterol nebulizer q6h prn for wheezing, dyspnea - continue home montelukast  Hypertension BP has been normotensive since admission. She is on candesartan 4 mg qd, will use formulary alternative irbesartan. - irbesartan 37.5 mg po qd - continue to monitor  CAD h/o STEMI   HLD Patient had STEMI in July 2019  with 95% occlusion of prox LAD, s/p DES in LAD. EF then 40-45%, no MVP. Previously patient was on DAPT, which was discontinued after 1 year. Current medications include metoprolol succinate 50 mg qd, atorvastatin 80 mg qd, ASA 81 mg. Last LDL in November 2022 below goal of <70. She has no chest pain today, EKG pending. VSS. - continue home medications  Hypercalcemia   Hyperparathyroidism Calcium chronically mildly elevated to 10.7, PTH mildly elevated to 91. She has followed with endocrinology in the past. Followed by PCP as well. No hypercalcemia on labs here.  FEN/GI: heart healthy diet Prophylaxis: lovenox   Disposition: to med-surg   History of Present Illness:  Alyssa Phillips is a 66 y.o. female presenting with cellulitis of left elbow/forearm. 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 She does not recall any injuries, no cuts in her skin. She is taking aspirin and reports more bruising as a result. Then Saturday AM, she had pain and increasing redness/swelling in her left elbow and forearm. She was seen at [facility] walk-in-clinic, had an x-ray that she reports did not show any fracture. She was treated with keflex and took 4 doses of it. Last night she awoke with sweats, fever, and nausea, no emesis. She denies any other symptoms. She only takes tylenol as she has had hives with advil in the past.   Review Of Systems: Per HPI with the following additions:   Review of Systems  Constitutional:  Positive for chills and fever. Negative for activity change.  HENT:  Negative for rhinorrhea and sore  throat.   Respiratory:  Negative for cough and shortness of breath.   Cardiovascular:  Negative for chest pain, palpitations and leg swelling.  Gastrointestinal:  Negative for abdominal pain, constipation, diarrhea, nausea and vomiting.  Musculoskeletal:  Positive for joint swelling.   Skin:  Positive for color change and rash.  All other systems reviewed and are negative.   Patient Active Problem List   Diagnosis Date Noted   Vitamin D deficiency 06/17/2021   ST elevation (STEMI) myocardial infarction of unspecified site (HCC) 06/17/2021   Mitral valve prolapse 06/17/2021   Mild intermittent asthma 06/17/2021   Body mass index (BMI) 40.0-44.9, adult (HCC) 06/17/2021   Hypertension 03/14/2021   Encounter for orthopedic follow-up care 11/12/2020   Tinnitus, bilateral 07/19/2020   Hearing loss 07/19/2020   BPPV (benign paroxysmal positional vertigo), unspecified laterality 07/19/2020   Closed fracture of proximal phalanx of great toe 07/08/2020   Vertigo 05/14/2020   Hyperparathyroidism (HCC) 07/22/2019   Hypercalcemia 07/22/2019   Allergic rhinitis due to pollen 12/18/2018   History of ST elevation myocardial infarction (STEMI) 12/04/2017   Morbid obesity (HCC) 12/04/2017   Encounter for general adult medical examination with abnormal findings 12/04/2017   Moderate persistent asthma without complication 12/04/2017   Hyperlipidemia    Osteoarthrosis involving lower leg 05/10/2012    Past Medical History: Past Medical History:  Diagnosis Date   Allergic rhinitis    Arthritis    Osteoarthrits-knees-hx. RTKA   Asthma    environmental agents induced asthma   Carpal tunnel syndrome of left wrist 02/19/2020   Carpal tunnel syndrome of right wrist 02/19/2020   Coronary artery disease    Family history of adverse reaction to anesthesia    daughter very sensitive to anesthesia   GERD (gastroesophageal reflux disease)    Heart murmur    hx. mitral valve proplapse-mostly asymptomatic-->not apprectiaed n echo   Hepatic steatosis 03/05/2008   [place]   Hyperlipidemia    Hypertension    Morbid obesity due to excess calories (HCC)    Pain in joint of left shoulder 06/12/2017   Pain in left foot 04/04/2018   Pain of joint of left ankle and foot 12/18/2019   STEMI (ST  elevation myocardial infarction) (HCC) 10/2017   LAD treated with DESx1   UTI (urinary tract infection)    Vitamin D deficiency     Past Surgical History: Past Surgical History:  Procedure Laterality Date   5th finger surgery Right 1990   unknown injury   CARDIAC CATHETERIZATION     CARPAL TUNNEL RELEASE  08/2020   CHOLECYSTECTOMY     COLONOSCOPY WITH PROPOFOL N/A 06/08/2016   Procedure: COLONOSCOPY WITH PROPOFOL;  Surgeon: Dorena Cookey, MD;  Location: [facility] ENDOSCOPY;  Service: Endoscopy;  Laterality: N/A;   CORONARY STENT INTERVENTION N/A 11/01/2017   Procedure: CORONARY STENT INTERVENTION;  Surgeon: Swaziland, Peter M, MD;  Location: [facility] INVASIVE CV LAB;  Service: Cardiovascular;  Laterality: N/A;  Resolute Onyx 4.5 mm x12 mm   CORONARY/GRAFT ACUTE MI REVASCULARIZATION N/A 11/01/2017   Procedure: Coronary/Graft Acute MI Revascularization;  Surgeon: Swaziland, Peter M, MD;  Location: [facility] INVASIVE CV LAB;  Service: Cardiovascular;  Laterality: N/A;   KNEE ARTHROSCOPY  05/02/2012   x2 right/ x1 left   LEFT HEART CATH AND CORONARY ANGIOGRAPHY N/A 11/01/2017   Procedure: LEFT HEART CATH AND CORONARY ANGIOGRAPHY;  Surgeon: Swaziland, Peter M, MD;  Location: [facility] INVASIVE CV LAB;  Service: Cardiovascular;  Laterality: N/A;   REPLACEMENT TOTAL KNEE Right 10/2010  ROOT CANAL     TOTAL KNEE ARTHROPLASTY  05/10/2012   Procedure: TOTAL KNEE ARTHROPLASTY;  Surgeon: Loanne DrillingFrank V Aluisio, MD;  Location: [facility] ORS;  Service: Orthopedics;  Laterality: Left;   TUBAL LIGATION     UMBILICAL HERNIA REPAIR      Social History: Social History   Tobacco Use   Smoking status: Never   Smokeless tobacco: Never  Vaping Use   Vaping Use: Never used  Substance Use Topics   Alcohol use: Yes    Comment: occ. rare   Drug use: No   Additional social history:   Please also refer to relevant sections of EMR.  Family History: Family History  Problem Relation Age of Onset   Hypertension Mother    Arthritis Mother     Lymphoma Mother    Asthma Mother    Cancer Mother    Hearing loss Mother    Miscarriages / [place] Mother    COPD Father    Alcohol abuse Father    Arthritis Father    Hearing loss Father    Hypertension Father    Miscarriages / [place] Sister    Arthritis Maternal Grandmother    Hearing loss Maternal Grandmother    Breast cancer Maternal Grandmother [id number]   Arthritis Maternal Grandfather    Diabetes Maternal Grandfather    Hypertension Maternal Grandfather    Stroke Maternal Grandfather    Arthritis Paternal Grandmother    Diabetes Paternal Grandmother    Hearing loss Paternal Grandmother    Hearing loss Paternal Grandfather    Learning disabilities Paternal Grandfather    Allergies and Medications: Allergies  Allergen Reactions   Compazine [Prochlorperazine Edisylate] Other (See Comments)    Eyes rolled back, tongue curled up   Advil [Ibuprofen] Hives   Aspirin Other (See Comments)    HAS to be EC aspirin!!   No current facility-administered medications on file prior to encounter.   Current Outpatient Medications on File Prior to Encounter  Medication Sig Dispense Refill   albuterol (VENTOLIN HFA) 108 (90 Base) MCG/ACT inhaler Inhale 1-2 puffs into the lungs every 6 (six) hours as needed for wheezing or shortness of breath. 6 g 11   aspirin EC 81 MG EC tablet Take 1 tablet (81 mg total) by mouth daily.     atorvastatin (LIPITOR) 80 MG tablet TAKE 1 TABLET BY MOUTH DAILY AT 6 PM. 90 tablet 0   azelastine (OPTIVAR) 0.05 % ophthalmic solution azelastine 0.05 % eye drops     B Complex Vitamins (B COMPLEX 1 PO) Take by mouth.     budesonide-formoterol (SYMBICORT) 160-4.5 MCG/ACT inhaler Inhale 2 puffs into the lungs 2 (two) times daily. 3 each 1   candesartan (ATACAND) 4 MG tablet TAKE 1 TABLET BY MOUTH EVERY DAY 90 tablet 0   cholecalciferol (VITAMIN D) 1000 units tablet Take 1,000 Units by mouth 2 (two) times daily.     diclofenac sodium (VOLTAREN) 1 % GEL Apply 2-4 g  topically daily as needed (as directed to painful sites).      levalbuterol (XOPENEX) 1.25 MG/3ML nebulizer solution levalbuterol 1.25 mg/3 mL solution for nebulization  USE 1 VIAL VIA NEBULIZER 3 TIMES A DAY     levocetirizine (XYZAL) 5 MG tablet Take 1 tablet (5 mg total) by mouth every evening. 90 tablet 3   meclizine (ANTIVERT) 25 MG tablet Take 1 tablet (25 mg total) by mouth 3 (three) times daily as needed for dizziness. 90 tablet 3   metoprolol succinate (TOPROL-XL) 50  MG 24 hr tablet TAKE 1 TABLET BY MOUTH EVERY DAY WITH OR IMMEDIATELY FOLLOWING A MEAL 90 tablet 0   mometasone (NASONEX) 50 MCG/ACT nasal spray Place 2 sprays into the nose every morning. 17 g 5   montelukast (SINGULAIR) 10 MG tablet Take 1 tablet (10 mg total) by mouth at bedtime. 90 tablet 3   Multiple Vitamin (MULTIVITAMIN) tablet Take 1 tablet by mouth daily.     nitroGLYCERIN (NITROSTAT) 0.4 MG SL tablet Place 1 tablet (0.4 mg total) under the tongue every 5 (five) minutes as needed for chest pain. 25 tablet 11   ondansetron (ZOFRAN) 4 MG tablet Take 1 tablet (4 mg total) by mouth every 8 (eight) hours as needed for nausea or vomiting. 30 tablet 11   oseltamivir (TAMIFLU) 75 MG capsule Take 1 capsule (75 mg total) by mouth 2 (two) times daily. 10 capsule 0   traMADol (ULTRAM) 50 MG tablet Take 50 mg by mouth every 6 (six) hours as needed.      Objective: BP 120/70 (BP Location: Right Arm)    Pulse 67    Temp (!) 100.4 F (38 C) (Oral)    Resp 16    SpO2 100%  Exam: General: age-appropriate WW, resting comfortably in bed, NAD,  Eyes: PERRL, anicteric ENTM: clear oropharynx, no rhinorrhea Neck: supple, no LAD Cardiovascular: RRR, no m/r/g, 2+ radial and DP pulses Respiratory: CTAB, no iWOB Gastrointestinal: soft, NT, ND, normal bowel sounds Elbow: - Inspection: no obvious deformity b/l. + swelling, erythema on left elbow/forearm. - Palpation: + generalized mild TTP on left - ROM: full active ROM in flexion and  extension b/l. No crepitus - Strength: 5/5 strength in wrist flexion and extension without pain b/l. 5/5 strength in biceps, triceps b/l. - Neuro: NV intact distally b/l - Special testing: no laxity with varus/valgus stress. No pain with resisted ECRB or supination. Derm: sharply demarcated erythema and edema of left elbow and forearm from just posterior of elbow to mid forearm, warm to the touch Neuro: CN2-12 grossly intact, AO x3 Psych: pleasant and appropriate  Labs and Imaging: CBC BMET  Recent Labs  Lab 06/19/21 1359  WBC 10.2  HGB 13.8  HCT 43.9  PLT 172   Recent Labs  Lab 06/19/21 1359  NA 136  K 3.7  CL 105  CO2 25  BUN 10  CREATININE 0.67  GLUCOSE 97  CALCIUM 10.2     EKG: ekg pending  No results found.  Shirlean Mylar, MD 06/19/2021, 3:37 PM PGY-3, [facility] Family Medicine FPTS Intern pager: [phone], text pages welcome

## 2021-06-19 NOTE — ED Provider Notes (Signed)
?[facility] EMERGENCY DEPARTMENT ?Provider Note ? ? ?CSN: [id number] ?Arrival date & time: 06/19/21  1232 ? ?  ? ?History ? ?Chief Complaint  ?Patient presents with  ? Cellulitis  ? ? ?Alyssa Phillips is a 67 y.o. female. ? ?HPI ? ?66 year old female presenting to the emergency department with a complaint of left arm cellulitis.  The patient was seen by an orthopedist yesterday due to a rash on her left arm that was painful and erythematous and was started on Keflex.  She states that she has been taking the Keflex but it continues to endorse worsening left arm pain and redness and swelling.  The redness has extended from close to her proximal forearm down her forearm.  She felt febrile this morning.  On arrival, the patient was febrile T1 100.4, otherwise hemodynamically stable, not tachycardic or tachypneic. ? ?Home Medications ?Prior to Admission medications   ?Medication Sig Start Date End Date Taking? Authorizing Provider  ?albuterol (VENTOLIN HFA) 108 (90 Base) MCG/ACT inhaler Inhale 1-2 puffs into the lungs every 6 (six) hours as needed for wheezing or shortness of breath. 03/14/21   Kuneff, Renee A, DO  ?aspirin EC 81 MG EC tablet Take 1 tablet (81 mg total) by mouth daily. 11/03/17   Robbie Lis M, PA-C  ?atorvastatin (LIPITOR) 80 MG tablet TAKE 1 TABLET BY MOUTH DAILY AT 6 PM. 06/16/21   [place], Peter M, MD  ?azelastine (OPTIVAR) 0.05 % ophthalmic solution azelastine 0.05 % eye drops    [provider]  ?B Complex Vitamins (B COMPLEX 1 PO) Take by mouth.    [provider]  ?budesonide-formoterol (SYMBICORT) 160-4.5 MCG/ACT inhaler Inhale 2 puffs into the lungs 2 (two) times daily. 03/14/21   Kuneff, Renee A, DO  ?candesartan (ATACAND) 4 MG tablet TAKE 1 TABLET BY MOUTH EVERY DAY 06/16/21   [place], Peter M, MD  ?cholecalciferol (VITAMIN D) 1000 units tablet Take 1,000 Units by mouth 2 (two) times daily.    [provider]  ?diclofenac sodium (VOLTAREN) 1 % GEL  Apply 2-4 g topically daily as needed (as directed to painful sites).     [provider]  ?levalbuterol Pauline Aus) 1.25 MG/3ML nebulizer solution levalbuterol 1.25 mg/3 mL solution for nebulization ? USE 1 VIAL VIA NEBULIZER 3 TIMES A DAY    [provider]  ?levocetirizine (XYZAL) 5 MG tablet Take 1 tablet (5 mg total) by mouth every evening. 03/14/21   Kuneff, Renee A, DO  ?meclizine (ANTIVERT) 25 MG tablet Take 1 tablet (25 mg total) by mouth 3 (three) times daily as needed for dizziness. 03/14/21   Kuneff, Renee A, DO  ?metoprolol succinate (TOPROL-XL) 50 MG 24 hr tablet TAKE 1 TABLET BY MOUTH EVERY DAY WITH OR IMMEDIATELY FOLLOWING A MEAL 06/16/21   [place], Peter M, MD  ?mometasone (NASONEX) 50 MCG/ACT nasal spray Place 2 sprays into the nose every morning. 03/14/21   Kuneff, Renee A, DO  ?montelukast (SINGULAIR) 10 MG tablet Take 1 tablet (10 mg total) by mouth at bedtime. 03/14/21   Felix Pacini A, DO  ?Multiple Vitamin (MULTIVITAMIN) tablet Take 1 tablet by mouth daily.    [provider]  ?nitroGLYCERIN (NITROSTAT) 0.4 MG SL tablet Place 1 tablet (0.4 mg total) under the tongue every 5 (five) minutes as needed for chest pain. 06/16/20 06/16/21  [place], Peter M, MD  ?ondansetron (ZOFRAN) 4 MG tablet Take 1 tablet (4 mg total) by mouth every 8 (eight) hours as needed for nausea or  vomiting. 03/14/21   Kuneff, Renee A, DO  ?oseltamivir (TAMIFLU) 75 MG capsule Take 1 capsule (75 mg total) by mouth 2 (two) times daily. 03/23/21   Kuneff, Renee A, DO  ?traMADol (ULTRAM) 50 MG tablet Take 50 mg by mouth every 6 (six) hours as needed. 03/05/20   [provider]  ?   ? ?Allergies    ?Compazine [prochlorperazine edisylate], Advil [ibuprofen], and Aspirin   ? ?Review of Systems   ?Review of Systems  ?Constitutional:  Positive for fever.  ?Skin:  Positive for color change and rash.  ?All other systems reviewed and are negative. ? ?Physical Exam ?Updated Vital Signs ?BP 114/64   Pulse 72    Temp 98.4 ?F (36.9 ?C) (Oral)   Resp 18   Wt 119.3 kg   SpO2 98%   BMI 39.99 kg/m?  ?Physical Exam ?Vitals and nursing note reviewed.  ?Constitutional:   ?   General: She is not in acute distress. ?   Appearance: She is well-developed.  ?HENT:  ?   Head: Normocephalic and atraumatic.  ?Eyes:  ?   Conjunctiva/sclera: Conjunctivae normal.  ?Cardiovascular:  ?   Rate and Rhythm: Normal rate and regular rhythm.  ?   Heart sounds: No murmur heard. ?Pulmonary:  ?   Effort: Pulmonary effort is normal. No respiratory distress.  ?   Breath sounds: Normal breath sounds.  ?Abdominal:  ?   Palpations: Abdomen is soft.  ?   Tenderness: There is no abdominal tenderness.  ?Musculoskeletal:     ?   General: No swelling.  ?   Cervical back: Neck supple.  ?Skin: ?   General: Skin is warm and dry.  ?   Capillary Refill: Capillary refill takes less than 2 seconds.  ?   Findings: Erythema and rash present.  ?   Comments: Left upper extremity/left forearm erythema, mild tenderness, warmth consistent with likely cellulitis.  No fluctuance to suggest abscess  ?Neurological:  ?   Mental Status: She is alert.  ?Psychiatric:     ?   Mood and Affect: Mood normal.  ? ? ?ED Results / Procedures / Treatments   ?Labs ?(all labs ordered are listed, but only abnormal results are displayed) ?Labs Reviewed  ?LACTIC ACID, PLASMA - Abnormal; Notable for the following components:  ?    Result Value  ? Lactic Acid, Venous 2.0 (*)   ? All other components within normal limits  ?CULTURE, BLOOD (ROUTINE X 2)  ?RESP PANEL BY RT-PCR (FLU A&B, COVID) ARPGX2  ?BASIC METABOLIC PANEL  ?CBC WITH DIFFERENTIAL/PLATELET  ? ? ?EKG ?None ? ?Radiology ?No results found. ? ?Procedures ?Procedures  ? ? ?Medications Ordered in ED ?Medications  ?sodium chloride 0.9 % bolus 1,000 mL (0 mLs Intravenous Stopped 06/19/21 1547)  ?  And  ?0.9 %  sodium chloride infusion ( Intravenous New Bag/Given 06/19/21 1553)  ?vancomycin (VANCOCIN) IVPB 1000 mg/200 mL premix (1,000 mg  Intravenous New Bag/Given 06/19/21 1553)  ?ceFEPIme (MAXIPIME) 2 g in sodium chloride 0.9 % 100 mL IVPB (0 g Intravenous Stopped 06/19/21 1547)  ? ? ?ED Course/ Medical Decision Making/ A&P ?  ?                        ?Medical Decision Making ?Amount and/or Complexity of Data Reviewed ?Labs: ordered. ? ?Risk ?Prescription drug management. ?Decision regarding hospitalization. ? ? ?66 year old female presenting to the emergency department with a complaint of left arm cellulitis.  The  patient was seen by an orthopedist yesterday due to a rash on her left arm that was painful and erythematous and was started on Keflex.  She states that she has been taking the Keflex but it continues to endorse worsening left arm pain and redness and swelling.  The redness has extended from close to her proximal forearm down her forearm.  She felt febrile this morning.  On arrival, the patient was febrile T1 100.4, otherwise hemodynamically stable, not tachycardic or tachypneic. ? ?Physical exam significant for cellulitis of the left forearm, no fluctuance to suggest abscess.  Given the patient's failed outpatient treatment of uncomplicated cellulitis, I informed the patient that she would likely need admission for further monitoring and IV antibiotics. ? ?Laboratory evaluation was performed significant for a BMP that was unremarkable, CBC without a leukocytosis or anemia, lactic acid mildly elevated 2.0.  The patient was administered an IV fluid bolus of 1 L NaCl for volume resuscitation and started on vancomycin IV and cefepime.  Family medicine was consulted for admission and the patient was subsequently admitted in stable condition. ? ? ?Final Clinical Impression(s) / ED Diagnoses ?Final diagnoses:  ?Cellulitis of left forearm  ? ? ?Rx / DC Orders ?ED Discharge Orders   ? ? None  ? ?  ? ? ?  ?Ernie Avena, MD ?06/19/21 1651 ? ?

## 2021-06-19 NOTE — TOC Initial Note (Signed)
Transition of Care (TOC) - Initial/Assessment Note  ? ? ?Patient Details  ?Name: Alyssa Phillips ?MRN: [id number] ?Date of Birth: July 19, 1955 ? ?Transition of Care (TOC) CM/SW Contact:    ?Verdell Carmine, RN ?Phone Number: ?06/19/2021, 5:13 PM ? ?Clinical Narrative:                 ? ?Transition of Care Department [facility]) has reviewed patient and no TOC needs have been identified at this time. We will continue to monitor patient advancement through interdisciplinary progression rounds. If new patient transition needs arise, please place a TOC consult. ?  ?  ?  ?  ? ? ?Patient Goals and CMS Choice ?  ?  ?  ? ?Expected Discharge Plan and Services ?  ?  ?  ?  ?  ?                ?  ?  ?  ?  ?  ?  ?  ?  ?  ?  ? ?Prior Living Arrangements/Services ?  ?  ?  ?       ?  ?  ?  ?  ? ?Activities of Daily Living ?  ?  ? ?Permission Sought/Granted ?  ?  ?   ?   ?   ?   ? ?Emotional Assessment ?  ?  ?  ?  ?  ?  ? ?Admission diagnosis:  cellulitis arm ?Patient Active Problem List  ? Diagnosis Date Noted  ? Vitamin D deficiency 06/17/2021  ? ST elevation (STEMI) myocardial infarction of unspecified site [facility]) 06/17/2021  ? Mitral valve prolapse 06/17/2021  ? Mild intermittent asthma 06/17/2021  ? Body mass index (BMI) 40.0-44.9, adult ([place]) 06/17/2021  ? Hypertension 03/14/2021  ? Encounter for orthopedic follow-up care 11/12/2020  ? Tinnitus, bilateral 07/19/2020  ? Hearing loss 07/19/2020  ? BPPV (benign paroxysmal positional vertigo), unspecified laterality 07/19/2020  ? Closed fracture of proximal phalanx of great toe 07/08/2020  ? Vertigo 05/14/2020  ? Hyperparathyroidism ([place]) 07/22/2019  ? Hypercalcemia 07/22/2019  ? Allergic rhinitis due to pollen 12/18/2018  ? History of ST elevation myocardial infarction (STEMI) 12/04/2017  ? Morbid obesity ([place]) 12/04/2017  ? Encounter for general adult medical examination with abnormal findings 12/04/2017  ? Moderate persistent asthma without complication [id number]  ? Hyperlipidemia   ?  Osteoarthrosis involving lower leg 05/10/2012  ? ?PCP:  Howard Pouch A, DO ?Pharmacy:   ?[id number] - [place], [place] - [address] ?[address] [place] [address] ?Phone: [phone] Fax: [phone] ? ? ? ? ?Social Determinants of Health (SDOH) Interventions ?  ? ?Readmission Risk Interventions ?No flowsheet data found. ? ? ?

## 2021-06-19 NOTE — Care Management (Signed)
Lactate level 2.0 messaged provider with critical value ?

## 2021-06-19 NOTE — ED Notes (Signed)
Pt ambulated to BR with steady gait.

## 2021-06-20 ENCOUNTER — Ambulatory Visit: Payer: BC Managed Care – PPO | Admitting: Family Medicine

## 2021-06-20 ENCOUNTER — Observation Stay (HOSPITAL_COMMUNITY): Payer: BC Managed Care – PPO

## 2021-06-20 ENCOUNTER — Telehealth: Payer: Self-pay

## 2021-06-20 DIAGNOSIS — M25522 Pain in left elbow: Secondary | ICD-10-CM

## 2021-06-20 DIAGNOSIS — L03114 Cellulitis of left upper limb: Secondary | ICD-10-CM | POA: Diagnosis not present

## 2021-06-20 DIAGNOSIS — M25422 Effusion, left elbow: Secondary | ICD-10-CM | POA: Diagnosis not present

## 2021-06-20 LAB — HIV ANTIBODY (ROUTINE TESTING W REFLEX): HIV Screen 4th Generation wRfx: NONREACTIVE

## 2021-06-20 LAB — CBC
HCT: 38.4 % (ref 36.0–46.0)
Hemoglobin: 12.3 g/dL (ref 12.0–15.0)
MCH: 29.9 pg (ref 26.0–34.0)
MCHC: 32 g/dL (ref 30.0–36.0)
MCV: 93.4 fL (ref 80.0–100.0)
Platelets: 152 10*3/uL (ref 150–400)
RBC: 4.11 MIL/uL (ref 3.87–5.11)
RDW: 13.2 % (ref 11.5–15.5)
WBC: 9.4 10*3/uL (ref 4.0–10.5)
nRBC: 0 % (ref 0.0–0.2)

## 2021-06-20 LAB — BASIC METABOLIC PANEL
Anion gap: 6 (ref 5–15)
BUN: 9 mg/dL (ref 8–23)
CO2: 22 mmol/L (ref 22–32)
Calcium: 9.5 mg/dL (ref 8.9–10.3)
Chloride: 111 mmol/L (ref 98–111)
Creatinine, Ser: 0.57 mg/dL (ref 0.44–1.00)
GFR, Estimated: >60 mL/min (ref >60)
Glucose, Bld: 109 mg/dL — ABNORMAL HIGH (ref 70–99)
Potassium: 3.5 mmol/L (ref 3.5–5.1)
Sodium: 139 mmol/L (ref 135–145)

## 2021-06-20 MED ORDER — SODIUM CHLORIDE 0.9 % IV SOLN
2.0000 g | Freq: Once | INTRAVENOUS | Status: AC
  Administered 2021-06-20: 2 g via INTRAVENOUS
  Filled 2021-06-20: qty 2

## 2021-06-20 MED ORDER — MOMETASONE FURO-FORMOTEROL FUM 200-5 MCG/ACT IN AERO
2.0000 | INHALATION_SPRAY | Freq: Two times a day (BID) | RESPIRATORY_TRACT | Status: DC
  Administered 2021-06-20 – 2021-06-22 (×5): 2 via RESPIRATORY_TRACT
  Filled 2021-06-20: qty 8.8

## 2021-06-20 MED ORDER — SODIUM CHLORIDE 0.9 % IV SOLN
2.0000 g | Freq: Three times a day (TID) | INTRAVENOUS | Status: DC
  Administered 2021-06-20: 2 g via INTRAVENOUS
  Filled 2021-06-20: qty 2

## 2021-06-20 MED ORDER — VANCOMYCIN HCL IN DEXTROSE 1-5 GM/200ML-% IV SOLN
1000.0000 mg | Freq: Two times a day (BID) | INTRAVENOUS | Status: DC
  Administered 2021-06-20 – 2021-06-22 (×6): 1000 mg via INTRAVENOUS
  Filled 2021-06-20 (×7): qty 200

## 2021-06-20 NOTE — Evaluation (Signed)
Physical Therapy Evaluation ?Patient Details ?Name: Alyssa Phillips ?MRN: [id number] ?DOB: 03/24/1956 ?Today's Date: 06/20/2021 ? ?History of Present Illness ? Alyssa Phillips is a 66 y.o. female presenting with cellulitis L elbow, forearm. PMH is significant for asthma, vertigo, HTN, obesity, HLD, h/o STEMI, hypercalcemia, hyperparathyroidism.  ?Clinical Impression ?  ?Patient evaluated by Physical Therapy with no further acute PT needs identified. All education has been completed and the patient has no further questions. Overall managing well, and safe to ambulate in the hallways independently. See below for any follow-up Physical Therapy or equipment needs. PT is signing off. Thank you for this referral. ?   ?   ? ?Recommendations for follow up therapy are one component of a multi-disciplinary discharge planning process, led by the attending physician.  Recommendations may be updated based on patient status, additional functional criteria and insurance authorization. ? ?Follow Up Recommendations No PT follow up ? ?  ?Assistance Recommended at Discharge PRN  ?Patient can return home with the following ?  (A little help to avoid Lifting and/or overuse of L UE) ? ?  ?Equipment Recommendations None recommended by PT  ?Recommendations for Other Services ?    ?  ?Functional Status Assessment Patient has not had a recent decline in their functional status  ? ?  ?Precautions / Restrictions Precautions ?Precautions: None ?Restrictions ?Weight Bearing Restrictions: No  ? ?  ? ?Mobility ? Bed Mobility ?Overal bed mobility: Independent ?  ?  ?  ?  ?  ?  ?  ?  ? ?Transfers ?Overall transfer level: Independent ?  ?  ?  ?  ?  ?  ?  ?  ?  ?  ? ?Ambulation/Gait ?Ambulation/Gait assistance: Modified independent (Device/Increase time) ?Gait Distance (Feet): 300 Feet ?Assistive device: None, IV Pole ?Gait Pattern/deviations: Step-through pattern ?  ?  ?  ?General Gait Details: Overall no difficulty ? ?Stairs ?  ?  ?  ?  ?General  stair comments: Pt reports confidence in her abiltiy to negotiate the stairs in her home ? ?Wheelchair Mobility ?  ? ?Modified Rankin (Stroke Patients Only) ?  ? ?  ? ?Balance Overall balance assessment: No apparent balance deficits (not formally assessed) ?  ?  ?  ?  ?  ?  ?  ?  ?  ?  ?  ?  ?  ?  ?  ?  ?  ?  ?   ? ? ? ?Pertinent Vitals/Pain Pain Assessment ?Pain Assessment: Faces ?Faces Pain Scale: Hurts a little bit ?Pain Location: L elbow and forearm with light touch; Also reports low back discomfort, and asks about using her heating pad ?Pain Descriptors / Indicators: Discomfort ?Pain Intervention(s): Monitored during session, Repositioned, Other (comment) (Will ask about a k-pad)  ? ? ?Home Living Family/patient expects to be discharged to:: Private residence ?Living Arrangements: Spouse/significant other ?Available Help at Discharge: Family ?Type of Home: House ?Home Access: Stairs to enter ?Entrance Stairs-Rails: Right ?Entrance Stairs-Number of Steps: 6+5 ?Alternate Level Stairs-Number of Steps: 5 ?Home Layout: Two level ?  ?Additional Comments: Owns/Manages a childcare center  ?  ?Prior Function Prior Level of Function : Independent/Modified Independent ?  ?  ?  ?  ?  ?  ?  ?  ?  ? ? ?Hand Dominance  ? Dominant Hand: Right ? ?  ?Extremity/Trunk Assessment  ? Upper Extremity Assessment ?Upper Extremity Assessment: LUE deficits/detail ?LUE Deficits / Details: Erythma and swelling at L elbow and extending through forearm; Educated  pt in elevation and positioning; Able to use LUE for light ADLs ?  ? ?Lower Extremity Assessment ?Lower Extremity Assessment: Overall WFL for tasks assessed ?  ? ?Cervical / Trunk Assessment ?Cervical / Trunk Assessment: Normal  ?Communication  ? Communication: No difficulties  ?Cognition Arousal/Alertness: Awake/alert ?Behavior During Therapy: [facility] for tasks assessed/performed ?Overall Cognitive Status: Within Functional Limits for tasks assessed ?  ?  ?  ?  ?  ?  ?  ?  ?  ?  ?  ?  ?   ?  ?  ?  ?  ?  ?  ? ?  ?General Comments General comments (skin integrity, edema, etc.): Demonstrated elevation positioning for LUE ? ?  ?Exercises    ? ?Assessment/Plan  ?  ?PT Assessment Patient does not need any further PT services  ?PT Problem List   ? ?   ?  ?PT Treatment Interventions     ? ?PT Goals (Current goals can be found in the Care Plan section)  ?Acute Rehab PT Goals ?Patient Stated Goal: Hopes to get home soon ?PT Goal Formulation: All assessment and education complete, DC therapy ? ?  ?Frequency   ?  ? ? ?Co-evaluation   ?  ?  ?  ?  ? ? ?  ?AM-PAC PT "6 Clicks" Mobility  ?Outcome Measure Help needed turning from your back to your side while in a flat bed without using bedrails?: None ?Help needed moving from lying on your back to sitting on the side of a flat bed without using bedrails?: None ?Help needed moving to and from a bed to a chair (including a wheelchair)?: None ?Help needed standing up from a chair using your arms (e.g., wheelchair or bedside chair)?: None ?Help needed to walk in hospital room?: None ?Help needed climbing 3-5 steps with a railing? : None ?6 Click Score: 24 ? ?  ?End of Session   ?Activity Tolerance: Patient tolerated treatment well ?Patient left: in chair;with call bell/phone within reach ?Nurse Communication: Mobility status (Safe to independently ambulate in hallways) ?PT Visit Diagnosis: Pain ?Pain - Right/Left: Left ?Pain - part of body: Arm ?  ? ?Time: 0858 (minus approx 10 min to order meals and for Dr. Cyndia Skeeters examination)-0941 ?PT Time Calculation (min) (ACUTE ONLY): 43 min ? ? ?Charges:   PT Evaluation ?$PT Eval Low Complexity: 1 Low ?PT Treatments ?$Gait Training: 8-22 mins ?  ?   ? ? ?Van Clines, PT  ?Acute Rehabilitation Services ?Pager [phone] ?Office [phone] ? ? ?[name] ?06/20/2021, 9:55 AM ? ?

## 2021-06-20 NOTE — Hospital Course (Addendum)
Alyssa Phillips is a 66 y.o.female with a history of asthma, vertigo, HTN, obesity, HLD, h/o STEMI, hypercalcemia, hyperparathyroidism who was admitted to the [facility] Teaching Service at [facility] for cellulitis. Her hospital course is detailed below: ? ?Left arm cellulitis ?Pt presented febrile to 100.4, lactic acid 2 and physical exam c/w cellulitis unresponsive to Keflex. CBC unremarkable. Pt received Cefepime and Vancomycin and narrowed to strictly Vancomycin. LUE U/S noted diffuse soft tissue edema aboout the left elbow with small fluid collection posteriomedially  measuring up to 1.8cm concerning for abscess formation. General and orthopedic surgery both denied necessity for I&D given size, recommended abx. IR also denied necessity of  Pt completed 5 day course of Vancomycin and she was discharged home on Doxycycline and Cephalexin for a total of 14 days antibiotic treatment ? ?Other chronic conditions were medically managed with home medications and formulary alternatives as necessary (asthma, HTN, HLD, CAD, hyperparathyroidism) ? ?PCP Follow-up Recommendations: ?Ensure completion of antibiotics and resolution of arm swelling/erythema ?

## 2021-06-20 NOTE — Progress Notes (Signed)
Patient sleeping and resting comfortably.  Rounded with primary RN.  No concerns voiced.  No orders required.  Appreciated nightly round. ? ?Today's Vitals  ? 06/19/21 1937 06/19/21 2128 06/19/21 2139 06/19/21 2225  ?BP:   104/65   ?Pulse:   72   ?Resp:   18   ?Temp:   98.6 ?F (37 ?C)   ?TempSrc:   Oral   ?SpO2:   98%   ?Weight:      ?Height:      ?PainSc: 0-No pain 6   0-No pain  ? ?Body mass index is 39.85 kg/m?.  ? ?Left arm cellulitis ?Afebrile and hemodynamically stable overnight.  ?Placed pharmacy consult for Vancomycin and Cefepime ?F/u blood cultures ?Monitor fever curve ? ?Dana Allan, MD ?Family Medicine Residency    ?

## 2021-06-20 NOTE — Progress Notes (Signed)
Pharmacy Antibiotic Note ? ?Alyssa Phillips is a 66 y.o. female admitted on 06/19/2021 with cellulitis.  Pharmacy has been consulted for vancomycin and cefepime dosing. ? ?Plan: ?Vancomycin 1000 mg IV Q12H. Goal AUC 400-550.  Expected AUC 510.  SCr used 0.8 (actual 0.67).  ?Cefepime 2g IV Q8H. ? ?Height: 5\' 9"  (175.3 cm) ?Weight: 122.4 kg (269 lb 13.5 oz) ?IBW/kg (Calculated) : 66.2 ? ?Temp (24hrs), Avg:99.1 ?F (37.3 ?C), Min:98.4 ?F (36.9 ?C), Max:100.4 ?F (38 ?C) ? ?Recent Labs  ?Lab 06/19/21 ?1359 06/19/21 ?1420  ?WBC 10.2  --   ?CREATININE 0.67  --   ?LATICACIDVEN  --  2.0*  ?  ?Estimated Creatinine Clearance: 96.9 mL/min (by C-G formula based on SCr of 0.67 mg/dL).   ? ?Allergies  ?Allergen Reactions  ? Compazine [Prochlorperazine Edisylate] Other (See Comments)  ?  Eyes rolled back, tongue curled up  ? Advil [Ibuprofen] Hives  ? Aspirin Other (See Comments)  ?  HAS to be EC aspirin!!  ? ? ? ?Thank you for allowing pharmacy to be a part of this patient?s care. ? ?08/19/21, PharmD, BCPS  ?06/20/2021 1:00 AM ? ?

## 2021-06-20 NOTE — TOC Initial Note (Signed)
Transition of Care (TOC) - Initial/Assessment Note  ? ? ?Patient Details  ?Name: Alyssa Phillips ?MRN: [id number] ?Date of Birth: 28-Apr-1955 ? ?Transition of Care (TOC) CM/SW Contact:    ?Tom-Johnson, Hershal Coria, RN ?Phone Number: ?06/20/2021, 2:38 PM ? ?Clinical Narrative:                 ? ?CM spoke with patient at bedside about needs for post hospital transition. From home with husband and has three supportive adult children. Admitted for Cellulitis. 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 Independent with care and drive self prior to admission. Has a cane and walker at home. PCP is Natalia Leatherwood, DO and [facility] [place]. Has [employer] PPO insurance. No PT f/u noted. CM will continue to follow with needs. ? ?Expected Discharge Plan: Home/Self Care ?Barriers to Discharge: Continued Medical Work up ? ? ?Patient Goals and CMS Choice ?Patient states their goals for this hospitalization and ongoing recovery are:: To return home ?CMS Medicare.gov Compare Post Acute Care list provided to:: Patient ?Choice offered to / list presented to : NA ? ?Expected Discharge Plan and Services ?Expected Discharge Plan: Home/Self Care ?  ?Discharge Planning Services: CM Consult ?Post Acute Care Choice: NA ?Living arrangements for the past 2 months: Single Family Home ?                ?DME Arranged: N/A ?DME Agency: NA ?  ?  ?  ?HH Arranged: NA ?HH Agency: NA ?  ?  ?  ? ?Prior Living Arrangements/Services ?Living arrangements for the past 2 months: Single Family Home ?Lives with:: Spouse (Cane, walker) ?Patient language and need for interpreter reviewed:: Yes ?Do you feel safe going back to the place where you live?: Yes      ?Need for Family Participation in Patient Care: Yes (Comment) ?Care giver support system in place?: Yes (comment) ?Current home services: DME ?Criminal Activity/Legal Involvement Pertinent to Current Situation/Hospitalization: No - Comment as needed ? ?Activities of Daily  Living ?Home Assistive Devices/Equipment: Eyeglasses ?ADL Screening (condition at time of admission) ?Patient's cognitive ability adequate to safely complete daily activities?: Yes ?Is the patient deaf or have difficulty hearing?: No ?Does the patient have difficulty seeing, even when wearing glasses/contacts?: No ?Does the patient have difficulty concentrating, remembering, or making decisions?: No ?Patient able to express need for assistance with ADLs?: Yes ?Does the patient have difficulty dressing or bathing?: No ?Independently performs ADLs?: Yes (appropriate for developmental age) ?Does the patient have difficulty walking or climbing stairs?: No ?Weakness of Legs: None ?Weakness of Arms/Hands: None ? ?Permission Sought/Granted ?Permission sought to share information with : Case Manager, Family Supports ?Permission granted to share information with : Yes, Verbal Permission Granted ?   ?   ?   ?   ? ?Emotional Assessment ?Appearance:: Appears stated age ?Attitude/Demeanor/Rapport: Engaged, Gracious ?Affect (typically observed): Accepting, Appropriate, Calm, Hopeful ?Orientation: : Oriented to Self, Oriented to Place, Oriented to  Time, Oriented to Situation ?Alcohol / Substance Use: Not Applicable ?Psych Involvement: No (comment) ? ?Admission diagnosis:  Cellulitis [L03.90] ?Cellulitis of left forearm [L03.114] ?Patient Active Problem List  ? Diagnosis Date Noted  ? Cellulitis 06/19/2021  ? Vitamin D deficiency 06/17/2021  ? ST elevation (STEMI) myocardial infarction of unspecified site [facility]) 06/17/2021  ? Mitral valve prolapse 06/17/2021  ? Mild intermittent asthma 06/17/2021  ? Body mass index (BMI) 40.0-44.9, adult (HCC) 06/17/2021  ? Hypertension 03/14/2021  ? Encounter for orthopedic follow-up  care 11/12/2020  ? Tinnitus, bilateral 07/19/2020  ? Hearing loss 07/19/2020  ? BPPV (benign paroxysmal positional vertigo), unspecified laterality 07/19/2020  ? Closed fracture of proximal phalanx of great toe  07/08/2020  ? Vertigo 05/14/2020  ? Hyperparathyroidism (HCC) 07/22/2019  ? Hypercalcemia 07/22/2019  ? Allergic rhinitis due to pollen 12/18/2018  ? History of ST elevation myocardial infarction (STEMI) 12/04/2017  ? Morbid obesity (HCC) 12/04/2017  ? Encounter for general adult medical examination with abnormal findings 12/04/2017  ? Moderate persistent asthma without complication 12/04/2017  ? Hyperlipidemia   ? Osteoarthrosis involving lower leg 05/10/2012  ? ?PCP:  Felix Pacini A, DO ?Pharmacy:   ?[id number] [place], [place] - [address] ?[address] ?[place] [address] ?Phone: [phone] Fax: [phone] ? ? ? ? ?Social Determinants of Health (SDOH) Interventions ?  ? ?Readmission Risk Interventions ?No flowsheet data found. ? ? ?

## 2021-06-20 NOTE — Telephone Encounter (Signed)
Pt currently admitted to hospital ? ? ?[facility] Day - Client ?TELEPHONE ADVICE RECORD ?AccessNurse? ?Patient ?Name: ?Alyssa MCD ?Phillips ?Gender: Female ?DOB: 08-Nov-1955 ?Age: 66 Y 4 M 5 D ?Return ?Phone ?Number: ?[phone] ?(Primary) ?Address: ?City/ ?State/ ?Zip: [place] ? [address] ?Client [facility] Day - Client ?Client Site [facility] - Day ?Provider Felix Pacini ?Contact Type Call ?Who Is Calling Patient / Member / Family / Caregiver ?Call Type Triage / Clinical ?Relationship To Patient Self ?Return Phone Number [phone] (Primary) ?Chief Complaint Arm Pain (known cause) ?Reason for Call Symptomatic / Request for Health Information ?Initial Comment Caller states went to emerg [facility] care ?yesterday and was dx with cellulitis on left ?arm and the infection is not getting better ?with medication that was prescribed. Caller is ?requesting different antibiotic. Dr Claiborne Billings ?Translation No ?No Triage Reason Patient declined ?Nurse Assessment ?Nurse: Vito Backers, RN, [place] Date/Time (Eastern Time): 06/19/2021 8:16:53 AM ?Confirm and document reason for call. If ?symptomatic, describe symptoms. ?---Caller states went to emergency orthopedic care ?yesterday and was dx with Cellulitis on left arm and ?the infection is not getting better with medication ?that was prescribed. Caller is requesting different ?antibiotic. Keflex. Has nausea d/t medication. Declines ?triage. ?Does the patient have any new or worsening ?symptoms? ---Yes ?Will a triage be completed? ---No ?Select reason for no triage. ---Patient declined ?Disp. Time (Eastern ?Time) Disposition Final User ?06/19/2021 8:20:08 AM Clinical Call Yes [place], RN, [place] ?

## 2021-06-20 NOTE — Telephone Encounter (Signed)
Noted. FYI. Appt cancelled. ?
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
## 2021-06-21 DIAGNOSIS — H9313 Tinnitus, bilateral: Secondary | ICD-10-CM | POA: Diagnosis present

## 2021-06-21 DIAGNOSIS — K760 Fatty (change of) liver, not elsewhere classified: Secondary | ICD-10-CM | POA: Diagnosis present

## 2021-06-21 DIAGNOSIS — M25522 Pain in left elbow: Secondary | ICD-10-CM | POA: Diagnosis present

## 2021-06-21 DIAGNOSIS — Z87440 Personal history of urinary (tract) infections: Secondary | ICD-10-CM | POA: Diagnosis not present

## 2021-06-21 DIAGNOSIS — E213 Hyperparathyroidism, unspecified: Secondary | ICD-10-CM | POA: Diagnosis present

## 2021-06-21 DIAGNOSIS — H9190 Unspecified hearing loss, unspecified ear: Secondary | ICD-10-CM | POA: Diagnosis present

## 2021-06-21 DIAGNOSIS — J4540 Moderate persistent asthma, uncomplicated: Secondary | ICD-10-CM | POA: Diagnosis present

## 2021-06-21 DIAGNOSIS — E6609 Other obesity due to excess calories: Secondary | ICD-10-CM | POA: Diagnosis present

## 2021-06-21 DIAGNOSIS — E559 Vitamin D deficiency, unspecified: Secondary | ICD-10-CM | POA: Diagnosis present

## 2021-06-21 DIAGNOSIS — Z6839 Body mass index (BMI) 39.0-39.9, adult: Secondary | ICD-10-CM | POA: Diagnosis not present

## 2021-06-21 DIAGNOSIS — Z8249 Family history of ischemic heart disease and other diseases of the circulatory system: Secondary | ICD-10-CM | POA: Diagnosis not present

## 2021-06-21 DIAGNOSIS — Z20822 Contact with and (suspected) exposure to covid-19: Secondary | ICD-10-CM | POA: Diagnosis present

## 2021-06-21 DIAGNOSIS — I341 Nonrheumatic mitral (valve) prolapse: Secondary | ICD-10-CM | POA: Diagnosis present

## 2021-06-21 DIAGNOSIS — Z9181 History of falling: Secondary | ICD-10-CM | POA: Diagnosis not present

## 2021-06-21 DIAGNOSIS — M170 Bilateral primary osteoarthritis of knee: Secondary | ICD-10-CM | POA: Diagnosis present

## 2021-06-21 DIAGNOSIS — L03114 Cellulitis of left upper limb: Secondary | ICD-10-CM

## 2021-06-21 DIAGNOSIS — E785 Hyperlipidemia, unspecified: Secondary | ICD-10-CM | POA: Diagnosis present

## 2021-06-21 DIAGNOSIS — Z955 Presence of coronary angioplasty implant and graft: Secondary | ICD-10-CM | POA: Diagnosis not present

## 2021-06-21 DIAGNOSIS — I2510 Atherosclerotic heart disease of native coronary artery without angina pectoris: Secondary | ICD-10-CM | POA: Diagnosis present

## 2021-06-21 DIAGNOSIS — Z825 Family history of asthma and other chronic lower respiratory diseases: Secondary | ICD-10-CM | POA: Diagnosis not present

## 2021-06-21 DIAGNOSIS — I10 Essential (primary) hypertension: Secondary | ICD-10-CM | POA: Diagnosis present

## 2021-06-21 DIAGNOSIS — I252 Old myocardial infarction: Secondary | ICD-10-CM | POA: Diagnosis not present

## 2021-06-21 DIAGNOSIS — H8110 Benign paroxysmal vertigo, unspecified ear: Secondary | ICD-10-CM | POA: Diagnosis present

## 2021-06-21 DIAGNOSIS — Z8261 Family history of arthritis: Secondary | ICD-10-CM | POA: Diagnosis not present

## 2021-06-21 DIAGNOSIS — K219 Gastro-esophageal reflux disease without esophagitis: Secondary | ICD-10-CM | POA: Diagnosis present

## 2021-06-21 LAB — BASIC METABOLIC PANEL
Anion gap: 6 (ref 5–15)
BUN: 7 mg/dL — ABNORMAL LOW (ref 8–23)
CO2: 21 mmol/L — ABNORMAL LOW (ref 22–32)
Calcium: 9.5 mg/dL (ref 8.9–10.3)
Chloride: 109 mmol/L (ref 98–111)
Creatinine, Ser: 0.61 mg/dL (ref 0.44–1.00)
GFR, Estimated: >60 mL/min (ref >60)
Glucose, Bld: 87 mg/dL (ref 70–99)
Potassium: 3.7 mmol/L (ref 3.5–5.1)
Sodium: 136 mmol/L (ref 135–145)

## 2021-06-21 LAB — CBC
HCT: 37.2 % (ref 36.0–46.0)
Hemoglobin: 12.3 g/dL (ref 12.0–15.0)
MCH: 30.5 pg (ref 26.0–34.0)
MCHC: 33.1 g/dL (ref 30.0–36.0)
MCV: 92.3 fL (ref 80.0–100.0)
Platelets: 169 10*3/uL (ref 150–400)
RBC: 4.03 MIL/uL (ref 3.87–5.11)
RDW: 13 % (ref 11.5–15.5)
WBC: 7.1 10*3/uL (ref 4.0–10.5)
nRBC: 0 % (ref 0.0–0.2)

## 2021-06-21 NOTE — Progress Notes (Addendum)
Family Medicine Teaching Service ?Daily Progress Note ?Intern Pager: [phone] ? ?Patient name: Alyssa Phillips Medical record number: [id number] ?Date of birth: 1955/06/04 Age: 66 y.o. Gender: female ? ?Primary Care Provider: Ma Hillock, DO ?Consultants: None ?Code Status: Full ? ?Pt Overview and Major Events to Date:  ?3/5- Admitted ? ?Assessment and Plan: ?Left arm cellulitis ?Patient says she had trauma to arm a few weeks ago when she fell, likely could have been beginning of infection. Afebrile without leukocytosis WBC 7.1. U/S with 1.8cm abscess.  General surgery and orthopedic surgery both denied necessity for surgical I &D given small size, recommended antibiotics and reaching out to IR for potential aspiration who agreed with continuing with antibiotics. Should she develop signs of worsening infection or failure with antibiotics, can order MRI w/ contrast of elbow and then d/w IR again. ?Pt worked with PT, no further PT follow up indicated. ?- Day 3 IV Vancomycin (start 3/5), continue dosing her pharmacy ?- Awaiting blood cultures (NG <24 h) ?- Tylenol for fever, pain ?- AM CBC, BMP ? ? ?Asthma ?Breathing better today s/p albuterol neb. Stable on room air. No hypoxia ?- Continue albuterol nebulizer q6h PRN for wheezing, dyspnea ?- Continue home Symbicort ?- Continue home Montelukast ? ?FEN/GI: Heart healthy diet ?PPx: Lovenox ?Dispo:Home pending clinical improvement . Barriers include IV antbiotics, return of cultures.  ? ?Subjective:  ?Feeling well. Sleeping better. No complaints. Arm still tender and swollen. No chills, fevers, etc. ? ?Objective: ?Temp:  [98.2 ?F (36.8 ?C)-99.2 ?F (37.3 ?C)] 98.8 ?F (37.1 ?C) (03/07 0343) ?Pulse Rate:  [66-73] 68 (03/07 0911) ?Resp:  [16-17] 16 (03/07 0911) ?BP: (110-129)/(68-85) 128/68 (03/07 0343) ?SpO2:  [95 %-98 %] 95 % (03/07 0343) ?Physical Exam: ?General: Resting comfortably, in no distress ?Cardiovascular: RRR ?Respiratory: Clear in all fields, improved air  movement from yesterday ?Abdomen: Soft, non-tender ?Extremities: Left arm with continued erythema and edema, warm to touch, up to posterior mid arm. ? ?Laboratory: ?Recent Labs  ?Lab 06/19/21 ?1359 06/20/21 ?[id number] 06/21/21 ?0342  ?WBC 10.2 9.4 7.1  ?HGB 13.8 12.3 12.3  ?HCT 43.9 38.4 37.2  ?PLT 172 152 169  ? ?Recent Labs  ?Lab 06/19/21 ?1359 06/20/21 ?[id number] 06/21/21 ?0342  ?NA 136 139 136  ?K 3.7 3.5 3.7  ?CL 105 111 109  ?CO2 25 22 21*  ?BUN 10 9 7*  ?CREATININE 0.67 0.57 0.61  ?CALCIUM 10.2 9.5 9.5  ?GLUCOSE 97 109* 87  ? ? ? ?Imaging/Diagnostic Tests: ?[place] LT UPPER EXTREM LTD SOFT TISSUE NON VASCULAR ? ?Result Date: 06/20/2021 ?CLINICAL DATA:  Left elbow pain EXAM: ULTRASOUND LEFT UPPER EXTREMITY LIMITED TECHNIQUE: Ultrasound examination of the upper extremity soft tissues was performed in the area of clinical concern. COMPARISON:  None. FINDINGS: Ultrasound performed of the soft tissues of the left upper extremity at site of patient's clinical concern near the left elbow. Diffuse subcutaneous soft tissue edema. Focal fluid collection at the posteromedial aspect of the elbow measuring approximately 1.8 x 0.6 cm. There is hyperemia throughout the soft tissues. IMPRESSION: Diffuse soft tissue edema about the left elbow with small fluid collection posteromedially measuring up to 1.8 cm. Findings concerning for cellulitis with abscess formation. Electronically Signed   By: Davina Poke D.O.   On: 06/20/2021 16:05   ? ? ?Orvis Brill, DO ?06/21/2021, 9:50 AM ?PGY-1, [place] ?[place] Intern pager: [phone], text pages welcome ? ?

## 2021-06-21 NOTE — Progress Notes (Signed)
FPTS Interim Night Progress Note ? ?S:Patient sleeping comfortably.  Rounded with primary night RN.  No concerns voiced.  No orders required.   ? ?O: ?Today's Vitals  ? 06/21/21 5277 06/21/21 8242 06/21/21 3536 06/21/21 0343  ?BP:   128/68 128/68  ?Pulse:   66 66  ?Resp:    17  ?Temp:   98.8 ?F (37.1 ?C) 98.8 ?F (37.1 ?C)  ?TempSrc:   Oral Oral  ?SpO2:   95% 95%  ?Weight:      ?Height:      ?PainSc: 0-No pain Asleep    ? ? ? ? ?A/P: ?Continue current management ? ?Dana Allan MD ?PGY-3, [facility] ?Service pager [phone]   ?

## 2021-06-22 LAB — BASIC METABOLIC PANEL
Anion gap: 7 (ref 5–15)
BUN: 9 mg/dL (ref 8–23)
CO2: 22 mmol/L (ref 22–32)
Calcium: 9.5 mg/dL (ref 8.9–10.3)
Chloride: 112 mmol/L — ABNORMAL HIGH (ref 98–111)
Creatinine, Ser: 0.61 mg/dL (ref 0.44–1.00)
GFR, Estimated: >60 mL/min (ref >60)
Glucose, Bld: 92 mg/dL (ref 70–99)
Potassium: 3.8 mmol/L (ref 3.5–5.1)
Sodium: 141 mmol/L (ref 135–145)

## 2021-06-22 LAB — CBC
HCT: 37.2 % (ref 36.0–46.0)
Hemoglobin: 11.9 g/dL — ABNORMAL LOW (ref 12.0–15.0)
MCH: 29.9 pg (ref 26.0–34.0)
MCHC: 32 g/dL (ref 30.0–36.0)
MCV: 93.5 fL (ref 80.0–100.0)
Platelets: 167 10*3/uL (ref 150–400)
RBC: 3.98 MIL/uL (ref 3.87–5.11)
RDW: 12.9 % (ref 11.5–15.5)
WBC: 6.2 10*3/uL (ref 4.0–10.5)
nRBC: 0 % (ref 0.0–0.2)

## 2021-06-22 LAB — GLUCOSE, CAPILLARY: Glucose-Capillary: 90 mg/dL (ref 70–99)

## 2021-06-22 MED ORDER — CEPHALEXIN 500 MG PO CAPS: 500.0000 mg | ORAL_CAPSULE | Freq: Four times a day (QID) | ORAL | 0 refills | Status: AC

## 2021-06-22 MED ORDER — DOXYCYCLINE MONOHYDRATE 100 MG PO TABS: 100.0000 mg | ORAL_TABLET | Freq: Two times a day (BID) | ORAL | 0 refills | Status: AC

## 2021-06-22 NOTE — Progress Notes (Signed)
Family Medicine Teaching Service ?Daily Progress Note ?Intern Pager: [phone] ? ?Patient name: Alyssa Phillips Medical record number: [id number] ?Date of birth: [birth year] Age: 66 y.o. Gender: female ? ?Primary Care Provider: Natalia Leatherwood, DO ?Consultants: None ?Code Status: Full ? ?Pt Overview and Major Events to Date:  ?3/5- Admitted, IV Vancomycin started ? ?Assessment and Plan: ?Alyssa Phillips is a 66 year-old female with left arm cellulitis, improving with IV antibiotics. PMH significant for asthma, vertigo, HTN, obesity, HLD, h/o STEMI, hypercalcemia, hyperparathyroidism. ? ?Left arm cellulitis, improving ?Much improved erythema, swelling although still present. Reports some tenderness yesterday in arm. No fever, chills, other VSS. No leukocytosis WBC 6.2. Doing very well on IV Vancomycin, can likely transition to PO Doxycycline and Keflex today to cover MRSA and Strep- likely organisms causing infection. General surgery, Orthoopedic surgery and IR all agree fluid collection seen on U/S does not require I&D or aspiration due to small size (~1-2cm) and recommend continuing antibiotics for management. ?- Day 4 IV Vancomycin (start 3/5) ?- Bcx no growth at 2 days ?- Tylenol for fever, pain ? ?Asthma, stable ?Breathing well on room air. No hypoxia. ?- Continue home meds ?- Albuteral neb q6h PRN for wheezing, dyspnea ? ? ?FEN/GI: Heart healthy diet ?PPx: Lovenox ?Dispo:Home  today or tomorrow . Barriers include IV antbiotics.  ? ?Subjective:  ?Feeling well, sitting in chair eating breakfast this morning. Denies any chills, significant pain. Says her arm does look a little better but still has tenderness. Woke up drenched in sweat, but wore 2 gowns and a quilt to bed. ? ?Objective: ?Temp:  [97.8 ?F (36.6 ?C)-98.6 ?F (37 ?C)] 98 ?F (36.7 ?C) (03/08 0409) ?Pulse Rate:  [56-70] 56 (03/08 0409) ?Resp:  [16-18] 18 (03/08 0409) ?BP: (103-133)/(58-79) 120/65 (03/08 0409) ?SpO2:  [94 %-99 %] 98 % (03/08 0409) ?Physical  Exam: ?General: Sitting in chair eating breakfast ?Cardiovascular: RRR ?Respiratory: Normal work of breathing on room air ?Extremities: Left arm with improved erythema and edema. Not significantly tender to touch. Demarcation to mid posterior arm seems to have improved. ? ?Laboratory: ?Recent Labs  ?Lab 06/20/21 ?1287 06/21/21 ?8676 06/22/21 ?7209  ?WBC 9.4 7.1 6.2  ?HGB 12.3 12.3 11.9*  ?HCT 38.4 37.2 37.2  ?PLT 152 169 167  ? ?Recent Labs  ?Lab 06/20/21 ?4709 06/21/21 ?6283 06/22/21 ?6629  ?NA 139 136 141  ?K 3.5 3.7 3.8  ?CL 111 109 112*  ?CO2 22 21* 22  ?BUN 9 7* 9  ?CREATININE 0.57 0.61 0.61  ?CALCIUM 9.5 9.5 9.5  ?GLUCOSE 109* 87 92  ? ? ?Imaging/Diagnostic Tests: ?No results found. ? ? ?Darral Dash, DO ?06/22/2021, 8:31 AM ?PGY-1, [facility] ?FPTS Intern pager: [phone], text pages welcome ? ?

## 2021-06-22 NOTE — Progress Notes (Signed)
FPTS Interim Night Progress Note ? ?S:Patient sleeping comfortably.  Rounded with primary night RN.  No concerns voiced.  No orders required.   ? ?O: ?Today's Vitals  ? 06/21/21 1900 06/21/21 2104 06/22/21 0409 06/22/21 0504  ?BP:  133/68 120/65   ?Pulse:  68 (!) 56   ?Resp:  18 18   ?Temp:  97.8 ?F (36.6 ?C) 98 ?F (36.7 ?C)   ?TempSrc:      ?SpO2: 98% 99% 98%   ?Weight:      ?Height:      ?PainSc:    6   ? ? ? ? ?A/P: ?Continue current management  ? ?Dana Allan MD ?PGY-3, [facility] ?Service pager [phone]   ?

## 2021-06-23 ENCOUNTER — Telehealth: Payer: Self-pay

## 2021-06-23 NOTE — Discharge Summary (Signed)
Family Medicine Teaching Service ?Hospital Discharge Summary ? ?Patient name: Alyssa Phillips Medical record number: [id number] ?Date of birth: [birth year] Age: 66 y.o. Gender: female ?Date of Admission: 06/19/2021  Date of Discharge: 06/22/21 ?Admitting Physician: Shirlean Mylar, MD ? ?Primary Care Provider: Natalia Leatherwood, DO ?Consultants: IR, Gen Surg, Orthopedics ? ?Indication for Hospitalization: left arm cellulitis ? ?Discharge Diagnoses/Problem List:  ?Principal Problem: ?  Cellulitis ?Active Problems: ?  Cellulitis of left forearm ? ? ? ?Disposition: Home ? ?Discharge Condition: Stable ? ?Discharge Exam: General: Sitting in chair eating breakfast ?Cardiovascular: RRR ?Respiratory: Normal work of breathing on room air ?Extremities: Left arm with improved erythema and edema. Not significantly tender to touch. Demarcation to mid posterior arm seems to have improved. ?  ? ?Brief Hospital Course:  ?Alyssa Phillips is a 66 y.o.female with a history of asthma, vertigo, HTN, obesity, HLD, h/o STEMI, hypercalcemia, hyperparathyroidism who was admitted to the [facility] at [facility] for cellulitis. Her hospital course is detailed below: ? ?Left forearm cellulitis ?Pt presented febrile to 100.4, lactic acid 2 and physical exam c/w cellulitis unresponsive to Keflex. CBC unremarkable. Pt received Cefepime and Vancomycin and narrowed to strictly Vancomycin. LUE U/S noted diffuse soft tissue edema aboout the left elbow with small fluid collection posteriomedially  measuring up to 1.8cm concernign for abscess formation. General and orthopedic surgery both denied necessity for I&D given size, recommended abx. Pt completed 5 day course of Vancomycin and she was discharged home on Doxycycline and Cephalexin for a total of 14 days antibiotic treatment ? ? ?Other chronic conditions were medically managed with home medications and formulary alternatives as necessary (asthma, HTN, HLD, CAD,  hyperparathyroidism) ? ?PCP Follow-up Recommendations: ?Ensure completion of antibiotics and resolution of arm swelling/erythema ? ?Significant Procedures: None ? ?Significant Labs and Imaging:  ?Recent Labs  ?Lab 06/20/21 ?3810 06/21/21 ?1751 06/22/21 ?0258  ?WBC 9.4 7.1 6.2  ?HGB 12.3 12.3 11.9*  ?HCT 38.4 37.2 37.2  ?PLT 152 169 167  ? ?Recent Labs  ?Lab 06/19/21 ?1359 06/20/21 ?5277 06/21/21 ?8242 06/22/21 ?3536  ?NA 136 139 136 141  ?K 3.7 3.5 3.7 3.8  ?CL 105 111 109 112*  ?CO2 25 22 21* 22  ?GLUCOSE 97 109* 87 92  ?BUN 10 9 7* 9  ?CREATININE 0.67 0.57 0.61 0.61  ?CALCIUM 10.2 9.5 9.5 9.5  ? ? ?Results/Tests Pending at Time of Discharge: None ? ?Discharge Medications:  ?Allergies as of 06/22/2021   ? ?   Reactions  ? Compazine [prochlorperazine Edisylate] Other (See Comments)  ? Eyes rolled back, tongue curled up  ? Advil [ibuprofen] Hives  ? Aspirin Other (See Comments)  ? HAS to be EC aspirin!!  ? ?  ? ?  ?Medication List  ?  ? ?STOP taking these medications   ? ?guaiFENesin 600 MG 12 hr tablet ?Commonly known as: MUCINEX ?  ?meclizine 25 MG tablet ?Commonly known as: ANTIVERT ?  ?ondansetron 4 MG tablet ?Commonly known as: ZOFRAN ?  ?oseltamivir 75 MG capsule ?Commonly known as: Tamiflu ?  ? ?  ? ?TAKE these medications   ? ?acetaminophen 500 MG tablet ?Commonly known as: TYLENOL ?Take 1,000 mg by mouth every 8 (eight) hours. ?  ?albuterol 108 (90 Base) MCG/ACT inhaler ?Commonly known as: VENTOLIN HFA ?Inhale 1-2 puffs into the lungs every 6 (six) hours as needed for wheezing or shortness of breath. ?  ?aspirin 81 MG EC tablet ?Take 1 tablet (81 mg total) by mouth daily. ?  ?  atorvastatin 80 MG tablet ?Commonly known as: LIPITOR ?TAKE 1 TABLET BY MOUTH DAILY AT 6 PM. ?What changed: when to take this ?  ?ATROVENT NA ?Place 1 spray into the nose daily as needed. ?  ?azelastine 0.05 % ophthalmic solution ?Commonly known as: OPTIVAR ?Place 1 drop into both eyes daily as needed (itching). ?  ?B COMPLEX 1 PO ?Take 1  tablet by mouth at bedtime. ?  ?budesonide-formoterol 160-4.5 MCG/ACT inhaler ?Commonly known as: SYMBICORT ?Inhale 2 puffs into the lungs 2 (two) times daily. ?  ?candesartan 4 MG tablet ?Commonly known as: ATACAND ?TAKE 1 TABLET BY MOUTH EVERY DAY ?  ?cephALEXin 500 MG capsule ?Commonly known as: KEFLEX ?Take 1 capsule (500 mg total) by mouth 4 (four) times daily for 11 days. ?What changed: additional instructions ?  ?cholecalciferol 1000 units tablet ?Commonly known as: VITAMIN D ?Take 1,000 Units by mouth 2 (two) times daily. ?  ?CO Q 10 PO ?Take 1 capsule by mouth daily. ?  ?diclofenac sodium 1 % Gel ?Commonly known as: VOLTAREN ?Apply 2-4 g topically daily as needed (as directed to painful sites). ?  ?doxycycline 100 MG tablet ?Commonly known as: ADOXA ?Take 1 tablet (100 mg total) by mouth 2 (two) times daily for 11 days. ?  ?levalbuterol 1.25 MG/3ML nebulizer solution ?Commonly known as: XOPENEX ?Take 1.25 mg by nebulization every 8 (eight) hours as needed for wheezing or shortness of breath. ?  ?levocetirizine 5 MG tablet ?Commonly known as: XYZAL ?Take 1 tablet (5 mg total) by mouth every evening. ?  ?metoprolol succinate 50 MG 24 hr tablet ?Commonly known as: TOPROL-XL ?TAKE 1 TABLET BY MOUTH EVERY DAY WITH OR IMMEDIATELY FOLLOWING A MEAL ?What changed: See the new instructions. ?  ?mometasone 50 MCG/ACT nasal spray ?Commonly known as: NASONEX ?Place 2 sprays into the nose every morning. ?What changed:  ?when to take this ?reasons to take this ?  ?montelukast 10 MG tablet ?Commonly known as: SINGULAIR ?Take 1 tablet (10 mg total) by mouth at bedtime. ?  ?multivitamin tablet ?Take 1 tablet by mouth daily. ?  ?nitroGLYCERIN 0.4 MG SL tablet ?Commonly known as: Nitrostat ?Place 1 tablet (0.4 mg total) under the tongue every 5 (five) minutes as needed for chest pain. ?  ?senna 8.6 MG tablet ?Commonly known as: SENOKOT ?Take 1 tablet by mouth every other day. ?  ?TIGER BALM ARTHRITIS RUB EX ?Apply 1  application. topically at bedtime as needed (shoulder pain). ?  ?traMADol 50 MG tablet ?Commonly known as: ULTRAM ?Take 50 mg by mouth every 6 (six) hours as needed (pain). ?  ?VITAMIN C PO ?Take 1 tablet by mouth daily. ?  ?ZINC PO ?Take 1 tablet by mouth at bedtime. ?  ? ?  ? ? ?Discharge Instructions: Please refer to Patient Instructions section of EMR for full details.  Patient was counseled important signs and symptoms that should prompt return to medical care, changes in medications, dietary instructions, activity restrictions, and follow up appointments.  ? ?Follow-Up Appointments: ? ? ?Darral Dash, DO ?06/23/2021, 1:46 PM ?PGY-1, [facility]  ?

## 2021-06-23 NOTE — Telephone Encounter (Signed)
Transition Care Management Follow-up Telephone Call ?Date of discharge and from where: [facility] 06/22/21 ?How have you been since you were released from the hospital? Resting  ?Any questions or concerns? No ? ?Items Reviewed: ?Did the pt receive and understand the discharge instructions provided? Yes  ?Medications obtained and verified? Yes  ?Other? No  ?Any new allergies since your discharge? No  ?Dietary orders reviewed? Yes ?Do you have support at home? Yes  ? ?Home Care and Equipment/Supplies: ?Were home health services ordered? not applicable ?If so, what is the name of the agency?   ?Has the agency set up a time to come to the patient's home? not applicable ?Were any new equipment or medical supplies ordered?  No ?What is the name of the medical supply agency?  ?Were you able to get the supplies/equipment?  ?Do you have any questions related to the use of the equipment or supplies? No ? ?Functional Questionnaire: (I = Independent and D = Dependent) ?ADLs: I ? ?Bathing/Dressing- I ? ?Meal Prep- I ? ?Eating- I ? ?Maintaining continence- I ? ?Transferring/Ambulation- I ? ?Managing Meds- I ? ?Follow up appointments reviewed: ? ?PCP Hospital f/u appt confirmed? No  Pt stated she will call  ?Specialist Hospital f/u appt confirmed? No   ?Are transportation arrangements needed? No  ?If their condition worsens, is the pt aware to call PCP or go to the Emergency Dept.? Yes ?Was the patient provided with contact information for the PCP's office or ED? Yes ?Was to pt encouraged to call back with questions or concerns? Yes ? ?

## 2021-06-24 LAB — CULTURE, BLOOD (ROUTINE X 2): Culture: NO GROWTH

## 2021-06-28 DIAGNOSIS — M25522 Pain in left elbow: Secondary | ICD-10-CM | POA: Diagnosis not present

## 2021-06-29 DIAGNOSIS — M25522 Pain in left elbow: Secondary | ICD-10-CM | POA: Diagnosis not present

## 2021-06-30 ENCOUNTER — Ambulatory Visit (HOSPITAL_COMMUNITY): Payer: BC Managed Care – PPO | Admitting: Certified Registered Nurse Anesthetist

## 2021-06-30 ENCOUNTER — Encounter (HOSPITAL_COMMUNITY): Payer: Self-pay | Admitting: Orthopedic Surgery

## 2021-06-30 ENCOUNTER — Encounter (HOSPITAL_COMMUNITY)
Admission: RE | Disposition: A | Discharge status: Home / Self Care | Payer: Self-pay | Source: Ambulatory Visit | Attending: Orthopedic Surgery

## 2021-06-30 ENCOUNTER — Ambulatory Visit (HOSPITAL_COMMUNITY)
Admission: RE | Admit: 2021-06-30 | Discharge: 2021-06-30 | Disposition: A | Discharge status: Home / Self Care | Payer: BC Managed Care – PPO | Source: Ambulatory Visit | Attending: Orthopedic Surgery | Admitting: Orthopedic Surgery

## 2021-06-30 DIAGNOSIS — Z6839 Body mass index (BMI) 39.0-39.9, adult: Secondary | ICD-10-CM | POA: Insufficient documentation

## 2021-06-30 DIAGNOSIS — I2510 Atherosclerotic heart disease of native coronary artery without angina pectoris: Secondary | ICD-10-CM | POA: Insufficient documentation

## 2021-06-30 DIAGNOSIS — I252 Old myocardial infarction: Secondary | ICD-10-CM | POA: Diagnosis not present

## 2021-06-30 DIAGNOSIS — J45909 Unspecified asthma, uncomplicated: Secondary | ICD-10-CM | POA: Diagnosis not present

## 2021-06-30 DIAGNOSIS — L02414 Cutaneous abscess of left upper limb: Secondary | ICD-10-CM | POA: Diagnosis not present

## 2021-06-30 DIAGNOSIS — L03114 Cellulitis of left upper limb: Secondary | ICD-10-CM | POA: Diagnosis not present

## 2021-06-30 DIAGNOSIS — Z955 Presence of coronary angioplasty implant and graft: Secondary | ICD-10-CM | POA: Diagnosis not present

## 2021-06-30 DIAGNOSIS — Z79899 Other long term (current) drug therapy: Secondary | ICD-10-CM | POA: Diagnosis not present

## 2021-06-30 DIAGNOSIS — G8918 Other acute postprocedural pain: Secondary | ICD-10-CM | POA: Diagnosis not present

## 2021-06-30 DIAGNOSIS — I10 Essential (primary) hypertension: Secondary | ICD-10-CM | POA: Insufficient documentation

## 2021-06-30 DIAGNOSIS — M009 Pyogenic arthritis, unspecified: Secondary | ICD-10-CM | POA: Diagnosis not present

## 2021-06-30 HISTORY — PX: I & D EXTREMITY: SHX5045

## 2021-06-30 SURGERY — IRRIGATION AND DEBRIDEMENT EXTREMITY
Anesthesia: General | Laterality: Left

## 2021-06-30 MED ORDER — FENTANYL CITRATE (PF) 100 MCG/2ML IJ SOLN
INTRAMUSCULAR | Status: AC
  Administered 2021-06-30: 100 ug via INTRAVENOUS
  Filled 2021-06-30: qty 2

## 2021-06-30 MED ORDER — MIDAZOLAM HCL 2 MG/2ML IJ SOLN: 2.0000 mg | Freq: Once | INTRAMUSCULAR | Status: AC

## 2021-06-30 MED ORDER — LIDOCAINE 2% (20 MG/ML) 5 ML SYRINGE
INTRAMUSCULAR | Status: AC
  Filled 2021-06-30: qty 5

## 2021-06-30 MED ORDER — SODIUM CHLORIDE 0.9 % IR SOLN
Status: DC | PRN
  Administered 2021-06-30: 3000 mL
  Administered 2021-06-30: 1000 mL

## 2021-06-30 MED ORDER — ROPIVACAINE HCL 5 MG/ML IJ SOLN
INTRAMUSCULAR | Status: DC | PRN
  Administered 2021-06-30: 30 mL via PERINEURAL

## 2021-06-30 MED ORDER — MIDAZOLAM HCL 2 MG/2ML IJ SOLN
INTRAMUSCULAR | Status: DC | PRN
Start: 2021-06-30 — End: 2021-06-30
  Administered 2021-06-30: 2 mg via INTRAVENOUS

## 2021-06-30 MED ORDER — CEFAZOLIN IN SODIUM CHLORIDE 3-0.9 GM/100ML-% IV SOLN
3.0000 g | INTRAVENOUS | Status: AC
  Administered 2021-06-30: 3 g via INTRAVENOUS
  Filled 2021-06-30: qty 100

## 2021-06-30 MED ORDER — LIDOCAINE 2% (20 MG/ML) 5 ML SYRINGE
INTRAMUSCULAR | Status: DC | PRN
  Administered 2021-06-30: 40 mg via INTRAVENOUS

## 2021-06-30 MED ORDER — CEFAZOLIN SODIUM-DEXTROSE 2-4 GM/100ML-% IV SOLN: 2.0000 g | INTRAVENOUS | Status: DC

## 2021-06-30 MED ORDER — PROPOFOL 10 MG/ML IV BOLUS
INTRAVENOUS | Status: DC | PRN
  Administered 2021-06-30: 25 mg via INTRAVENOUS

## 2021-06-30 MED ORDER — DEXAMETHASONE SODIUM PHOSPHATE 10 MG/ML IJ SOLN
INTRAMUSCULAR | Status: DC | PRN
  Administered 2021-06-30: 5 mg

## 2021-06-30 MED ORDER — ACETAMINOPHEN 500 MG PO TABS
1000.0000 mg | ORAL_TABLET | Freq: Once | ORAL | Status: DC
Start: 2021-06-30 — End: 2021-07-01

## 2021-06-30 MED ORDER — FENTANYL CITRATE (PF) 100 MCG/2ML IJ SOLN: 100.0000 ug | Freq: Once | INTRAMUSCULAR | Status: AC

## 2021-06-30 MED ORDER — CEFAZOLIN SODIUM-DEXTROSE 2-4 GM/100ML-% IV SOLN
INTRAVENOUS | Status: AC
  Filled 2021-06-30: qty 100

## 2021-06-30 MED ORDER — PROPOFOL 500 MG/50ML IV EMUL
INTRAVENOUS | Status: DC | PRN
  Administered 2021-06-30: 100 ug/kg/min via INTRAVENOUS

## 2021-06-30 MED ORDER — MIDAZOLAM HCL 2 MG/2ML IJ SOLN
INTRAMUSCULAR | Status: AC
  Administered 2021-06-30: 2 mg via INTRAVENOUS
  Filled 2021-06-30: qty 2

## 2021-06-30 MED ORDER — LACTATED RINGERS IV SOLN: INTRAVENOUS | Status: DC

## 2021-06-30 MED ORDER — AMISULPRIDE (ANTIEMETIC) 5 MG/2ML IV SOLN: 10.0000 mg | Freq: Once | INTRAVENOUS | Status: DC | PRN

## 2021-06-30 MED ORDER — MIDAZOLAM HCL 2 MG/2ML IJ SOLN
INTRAMUSCULAR | Status: AC
  Filled 2021-06-30: qty 2

## 2021-06-30 MED ORDER — ONDANSETRON HCL 4 MG/2ML IJ SOLN
INTRAMUSCULAR | Status: DC | PRN
Start: 2021-06-30 — End: 2021-06-30
  Administered 2021-06-30: 4 mg via INTRAVENOUS

## 2021-06-30 MED ORDER — FENTANYL CITRATE (PF) 100 MCG/2ML IJ SOLN: 25.0000 ug | INTRAMUSCULAR | Status: DC | PRN

## 2021-06-30 MED ORDER — ONDANSETRON HCL 4 MG/2ML IJ SOLN
INTRAMUSCULAR | Status: AC
  Filled 2021-06-30: qty 2

## 2021-06-30 MED ORDER — CHLORHEXIDINE GLUCONATE 0.12 % MT SOLN
OROMUCOSAL | Status: AC
  Administered 2021-06-30: 15 mL via OROMUCOSAL
  Filled 2021-06-30: qty 15

## 2021-06-30 MED ORDER — CHLORHEXIDINE GLUCONATE 0.12 % MT SOLN
15.0000 mL | OROMUCOSAL | Status: AC
Start: 2021-06-30 — End: 2021-06-30
  Filled 2021-06-30: qty 15

## 2021-06-30 SURGICAL SUPPLY — 57 items
BAG COUNTER SPONGE SURGICOUNT (BAG) ×2 IMPLANT
BAG SPNG CNTER NS LX DISP (BAG) ×1
BNDG CMPR 9X4 STRL LF SNTH (GAUZE/BANDAGES/DRESSINGS)
BNDG COHESIVE 1X5 TAN STRL LF (GAUZE/BANDAGES/DRESSINGS) IMPLANT
BNDG CONFORM 2 STRL LF (GAUZE/BANDAGES/DRESSINGS) IMPLANT
BNDG ELASTIC 3X5.8 VLCR STR LF (GAUZE/BANDAGES/DRESSINGS) ×2 IMPLANT
BNDG ELASTIC 4X5.8 VLCR STR LF (GAUZE/BANDAGES/DRESSINGS) ×3 IMPLANT
BNDG ESMARK 4X9 LF (GAUZE/BANDAGES/DRESSINGS) ×1 IMPLANT
BNDG GAUZE ELAST 4 BULKY (GAUZE/BANDAGES/DRESSINGS) ×2 IMPLANT
CORD BIPOLAR FORCEPS 12FT (ELECTRODE) ×2 IMPLANT
COVER SURGICAL LIGHT HANDLE (MISCELLANEOUS) ×2 IMPLANT
CUFF TOURN SGL QUICK 24 (TOURNIQUET CUFF) ×2
CUFF TRNQT CYL 24X4X16.5-23 (TOURNIQUET CUFF) IMPLANT
DRAIN PENROSE 1/4X12 LTX STRL (WOUND CARE) IMPLANT
DRAPE SURG 17X23 STRL (DRAPES) ×2 IMPLANT
DRSG ADAPTIC 3X8 NADH LF (GAUZE/BANDAGES/DRESSINGS) ×2 IMPLANT
ELECT REM PT RETURN 9FT ADLT (ELECTROSURGICAL)
ELECTRODE REM PT RTRN 9FT ADLT (ELECTROSURGICAL) IMPLANT
GAUZE SPONGE 4X4 12PLY STRL (GAUZE/BANDAGES/DRESSINGS) ×2 IMPLANT
GAUZE SPONGE 4X4 12PLY STRL LF (GAUZE/BANDAGES/DRESSINGS) ×1 IMPLANT
GAUZE XEROFORM 1X8 LF (GAUZE/BANDAGES/DRESSINGS) ×1 IMPLANT
GAUZE XEROFORM 5X9 LF (GAUZE/BANDAGES/DRESSINGS) IMPLANT
GLOVE SURG PR MICRO ENCORE 7.5 (GLOVE) ×2 IMPLANT
GLOVE SURG UNDER POLY LF SZ7.5 (GLOVE) ×4 IMPLANT
GOWN STRL REUS W/ TWL LRG LVL3 (GOWN DISPOSABLE) ×3 IMPLANT
GOWN STRL REUS W/ TWL XL LVL3 (GOWN DISPOSABLE) ×1 IMPLANT
GOWN STRL REUS W/TWL LRG LVL3 (GOWN DISPOSABLE) ×6
GOWN STRL REUS W/TWL XL LVL3 (GOWN DISPOSABLE) ×2
HANDPIECE INTERPULSE COAX TIP (DISPOSABLE)
KIT BASIN OR (CUSTOM PROCEDURE TRAY) ×2 IMPLANT
KIT TURNOVER KIT B (KITS) ×2 IMPLANT
MANIFOLD NEPTUNE II (INSTRUMENTS) ×2 IMPLANT
NDL HYPO 25GX1X1/2 BEV (NEEDLE) IMPLANT
NEEDLE HYPO 25GX1X1/2 BEV (NEEDLE) IMPLANT
NS IRRIG 1000ML POUR BTL (IV SOLUTION) ×2 IMPLANT
PACK ORTHO EXTREMITY (CUSTOM PROCEDURE TRAY) ×2 IMPLANT
PAD ARMBOARD 7.5X6 YLW CONV (MISCELLANEOUS) ×4 IMPLANT
PAD CAST 4YDX4 CTTN HI CHSV (CAST SUPPLIES) ×1 IMPLANT
PADDING CAST COTTON 4X4 STRL (CAST SUPPLIES) ×4
SET CYSTO W/LG BORE CLAMP LF (SET/KITS/TRAYS/PACK) ×1 IMPLANT
SET HNDPC FAN SPRY TIP SCT (DISPOSABLE) IMPLANT
SLING ARM FOAM STRAP LRG (SOFTGOODS) ×1 IMPLANT
SOAP 2 % CHG 4 OZ (WOUND CARE) ×2 IMPLANT
SPONGE T-LAP 18X18 ~~LOC~~+RFID (SPONGE) ×3 IMPLANT
SUT ETHILON 4 0 PS 2 18 (SUTURE) IMPLANT
SUT ETHILON 5 0 P 3 18 (SUTURE)
SUT NYLON ETHILON 5-0 P-3 1X18 (SUTURE) IMPLANT
SUT PDS AB 2-0 CT2 27 (SUTURE) ×1 IMPLANT
SWAB COLLECTION DEVICE MRSA (MISCELLANEOUS) ×2 IMPLANT
SWAB CULTURE ESWAB REG 1ML (MISCELLANEOUS) ×1 IMPLANT
SYR CONTROL 10ML LL (SYRINGE) IMPLANT
TOWEL GREEN STERILE (TOWEL DISPOSABLE) ×2 IMPLANT
TOWEL GREEN STERILE FF (TOWEL DISPOSABLE) ×2 IMPLANT
TUBE CONNECTING 12X1/4 (SUCTIONS) ×2 IMPLANT
UNDERPAD 30X36 HEAVY ABSORB (UNDERPADS AND DIAPERS) ×2 IMPLANT
WATER STERILE IRR 1000ML POUR (IV SOLUTION) ×2 IMPLANT
YANKAUER SUCT BULB TIP NO VENT (SUCTIONS) ×2 IMPLANT

## 2021-06-30 NOTE — Transfer of Care (Signed)
Immediate Anesthesia Transfer of Care Note ? ?Patient: Alyssa Phillips ? ?Procedure(s) Performed: IRRIGATION AND DEBRIDEMENT left elbow (Left) ? ?Patient Location: PACU ? ?Anesthesia Type:MAC and Regional ? ?Level of Consciousness: awake and alert  ? ?Airway & Oxygen Therapy: Patient Spontanous Breathing ? ?Post-op Assessment: Report given to RN and Post -op Vital signs reviewed and stable ? ?Post vital signs: Reviewed and stable ? ?Last Vitals:  ?Vitals Value Taken Time  ?BP 143/76 06/30/21 2125  ?Temp    ?Pulse 72 06/30/21 2126  ?Resp 17 06/30/21 2126  ?SpO2 96 % 06/30/21 2126  ?Vitals shown include unvalidated device data. ? ?Last Pain:  ?Vitals:  ? 06/30/21 1845  ?TempSrc:   ?PainSc: 0-No pain  ?   ? ?Patients Stated Pain Goal: 2 (06/30/21 1727) ? ?Complications: No notable events documented. ?

## 2021-06-30 NOTE — Progress Notes (Signed)
Pacu Discharge Note ? ?Patient instuctions were given to family. Wound care, diet, pain, follow up care and how and whom to contact with concerns were discussed. Family aware someone needs to remain with patient overnight and concerns after receiving anesthesia and what to avoid and safety. Answered all questions and concerns.  ? ?Discussed use of sling and to keep it on until the block to L arm is no longer working.  ? ?Discharge paperwork has clear contact informations for surgeon and 24 hour RN line for concerns.  ? ?Discussed what concerns to look for including infection and signs/symptoms to look for.  ? ?IV was removed prior to discharge. Patient was brought to car with belongings.  ? ?Pt exits my care.   ?

## 2021-06-30 NOTE — Anesthesia Procedure Notes (Signed)
Anesthesia Regional Block: Supraclavicular block  ? ?Pre-Anesthetic Checklist: , timeout performed,  Correct Patient, Correct Site, Correct Laterality,  Correct Procedure, Correct Position, site marked,  Risks and benefits discussed,  Surgical consent,  Pre-op evaluation,  At surgeon's request and post-op pain management ? ?Laterality: Left ? ?Prep: chloraprep     ?  ?Needles:  ?Injection technique: Single-shot ? ?Needle Type: Echogenic Stimulator Needle   ? ? ?Needle Length: 5cm  ?Needle Gauge: 22  ? ? ? ?Additional Needles: ? ? ?Narrative:  ?Start time: 06/30/2021 6:34 PM ?End time: 06/30/2021 6:44 PM ?Injection made incrementally with aspirations every 5 mL. ? ?Performed by: Personally  ?Anesthesiologist: Heather Roberts, MD ? ?Additional Notes: ?Functioning IV was confirmed and monitors applied.  A 100mm 22ga echogenic arrow stimulator was used. Sterile prep and drape,hand hygiene and sterile gloves were used.Ultrasound guidance: relevant anatomy identified, needle position confirmed, local anesthetic spread visualized around nerve(s)., vascular puncture avoided.  Image printed for medical record.  Negative aspiration and negative test dose prior to incremental administration of local anesthetic. The patient tolerated the procedure well. ? ? ? ? ?

## 2021-06-30 NOTE — Op Note (Signed)
OPERATIVE NOTE ? ?DATE OF PROCEDURE: 06/30/2021 ? ?SURGEONS:  ?Primary: Orene Desanctis, MD ? ?PREOPERATIVE DIAGNOSIS: left elbow infection ? ?POSTOPERATIVE DIAGNOSIS: Same ? ?NAME OF PROCEDURE:  ? ?Left elbow irrigation and debridement of bursa and proximal forearm ? ?ANESTHESIA: General ? ?SKIN PREPARATION: Hibiclens ? ?ESTIMATED BLOOD LOSS: Minimal ? ?IMPLANTS: none ? ?INDICATIONS:  Alyssa Phillips is a 66 y.o. female who has the above preoperative diagnosis. The patient has decided to proceed with surgical intervention.  Risks, benefits and alternatives of operative management were discussed including, but not limited to, risks of anesthesia complications, infection, pain, persistent symptoms, stiffness, need for future surgery.  The patient understands, agrees and elects to proceed with surgery.   ? ?DESCRIPTION OF PROCEDURE: The patient was met in the pre-operative area and their identity was verified.  The operative location and laterality was also verified and marked.  The patient was brought to the OR and was placed supine on the table.  After repeat patient identification with the operative team anesthesia was provided and the patient was prepped and draped in the usual sterile fashion.  A final timeout was performed verifying the correction patient, procedure, location and laterality. ? ?The left upper extremity was elevated and tourniquet inflated to 250 mmHg.  The patient had previously been on almost 2 weeks of antibiotics and antibiotics were provided prior to inflation of the tourniquet.  At this time a longitudinal incision was made over the proximal lateral forearm.  Skin and subcutaneous tissues were divided and chronic necrotic inflammatory tissue was identified.  There was some cloudy fluid that was identified and evacuated.  The skin and subcutaneous tissues were divided down to the level of the fascia.  There did not appear to be a rent in the fascia nor involvement of the bone however this debridement was  done down to the level of the bone of the superficial border of the ulna.  The abscess remained superficial to this fascia and bone level.  There was direct indication with the olecranon bursa and this area was entered.  All fluid was removed and the excisional debridement was completed.  The wound was thoroughly irrigated with 3 L of normal saline via low flow cystoscopy tubing.  Prior to the irrigation 3 cultures were obtained and sent to microbiology.  The dead space was then closed down with 2-0 PDS sutures and the skin was closed in layers.  4-0 nylon suture was utilized in horizontal mattress fashion for skin closure.  A sterile soft bandage was applied.  The tourniquet was deflated and the fingers were pink and warm and well-perfused.  All counts were correct x2.  The patient was awoke from anesthesia and brought to PACU for recovery in stable condition. ? ? ?Matt Holmes, MD ? ?

## 2021-06-30 NOTE — Anesthesia Procedure Notes (Signed)
Procedure Name: [place] ?Date/Time: 06/30/2021 8:31 PM ?Performed by: Reece Agar, CRNA ?Pre-anesthesia Checklist: Patient identified, Emergency Drugs available, Suction available and Patient being monitored ?Patient Re-evaluated:Patient Re-evaluated prior to induction ?Oxygen Delivery Method: Simple face mask ? ? ? ? ?

## 2021-06-30 NOTE — H&P (Signed)
Preoperative History & Physical Exam ? ?Surgeon: Philipp Ovens, MD ? ?Diagnosis: left elbow infection ? ?Planned Procedure: Procedure(s) (LRB): ?IRRIGATION AND DEBRIDEMENT left elbow (Left) ? ?History of Present Illness:   ?Patient is a 66 y.o. female with symptoms consistent with left elbow infection who presents for surgical intervention. The risks, benefits and alternatives of surgical intervention were discussed and informed consent was obtained prior to surgery. ? ?Past Medical History:  ?Past Medical History:  ?Diagnosis Date  ? Allergic rhinitis   ? Arthritis   ? Osteoarthrits-knees-hx. RTKA  ? Asthma   ? environmental agents induced asthma  ? Carpal tunnel syndrome of left wrist 02/19/2020  ? Carpal tunnel syndrome of right wrist 02/19/2020  ? Coronary artery disease   ? Family history of adverse reaction to anesthesia   ? daughter very sensitive to anesthesia  ? GERD (gastroesophageal reflux disease)   ? Heart murmur   ? hx. mitral valve proplapse-mostly asymptomatic-->not apprectiaed n echo  ? Hepatic steatosis 03/05/2008  ? [place]  ? Hyperlipidemia   ? Hypertension   ? Morbid obesity due to excess calories (HCC)   ? Pain in joint of left shoulder 06/12/2017  ? Pain in left foot 04/04/2018  ? Pain of joint of left ankle and foot 12/18/2019  ? STEMI (ST elevation myocardial infarction) (HCC) 10/2017  ? LAD treated with DESx1  ? UTI (urinary tract infection)   ? Vitamin D deficiency   ? ? ?Past Surgical History:  ?Past Surgical History:  ?Procedure Laterality Date  ? 5th finger surgery Right 1990  ? unknown injury  ? CARDIAC CATHETERIZATION    ? CARPAL TUNNEL RELEASE  08/2020  ? CHOLECYSTECTOMY    ? COLONOSCOPY WITH PROPOFOL N/A 06/08/2016  ? Procedure: COLONOSCOPY WITH PROPOFOL;  Surgeon: Dorena Cookey, MD;  Location: [facility] ENDOSCOPY;  Service: Endoscopy;  Laterality: N/A;  ? CORONARY STENT INTERVENTION N/A 11/01/2017  ? Procedure: CORONARY STENT INTERVENTION;  Surgeon: Swaziland, Peter M, MD;  Location: [facility] INVASIVE CV  LAB;  Service: Cardiovascular;  Laterality: N/A;  Resolute Onyx 4.5 mm x12 mm  ? CORONARY/GRAFT ACUTE MI REVASCULARIZATION N/A 11/01/2017  ? Procedure: Coronary/Graft Acute MI Revascularization;  Surgeon: Swaziland, Peter M, MD;  Location: [facility] INVASIVE CV LAB;  Service: Cardiovascular;  Laterality: N/A;  ? KNEE ARTHROSCOPY  05/02/2012  ? x2 right/ x1 left  ? LEFT HEART CATH AND CORONARY ANGIOGRAPHY N/A 11/01/2017  ? Procedure: LEFT HEART CATH AND CORONARY ANGIOGRAPHY;  Surgeon: Swaziland, Peter M, MD;  Location: [facility] INVASIVE CV LAB;  Service: Cardiovascular;  Laterality: N/A;  ? REPLACEMENT TOTAL KNEE Right 10/2010  ? ROOT CANAL    ? TOTAL KNEE ARTHROPLASTY  05/10/2012  ? Procedure: TOTAL KNEE ARTHROPLASTY;  Surgeon: Loanne Drilling, MD;  Location: [facility] ORS;  Service: Orthopedics;  Laterality: Left;  ? TUBAL LIGATION    ? UMBILICAL HERNIA REPAIR    ? ? ?Medications:  ?Prior to Admission medications   ?Medication Sig Start Date End Date Taking? Authorizing Provider  ?acetaminophen (TYLENOL) 500 MG tablet Take 1,000 mg by mouth every 8 (eight) hours.    [provider]  ?albuterol (VENTOLIN HFA) 108 (90 Base) MCG/ACT inhaler Inhale 1-2 puffs into the lungs every 6 (six) hours as needed for wheezing or shortness of breath. 03/14/21   Felix Pacini A, DO  ?Ascorbic Acid (VITAMIN C PO) Take 1 tablet by mouth daily.    [provider]  ?aspirin EC 81 MG EC tablet Take 1 tablet (81 mg  total) by mouth daily. 11/03/17   Robbie LisSimmons, Brittainy M, PA-C  ?atorvastatin (LIPITOR) 80 MG tablet TAKE 1 TABLET BY MOUTH DAILY AT 6 PM. ?Patient taking differently: Take 80 mg by mouth every evening. 06/16/21   SwazilandJordan, Peter M, MD  ?azelastine (OPTIVAR) 0.05 % ophthalmic solution Place 1 drop into both eyes daily as needed (itching).    [provider]  ?B Complex Vitamins (B COMPLEX 1 PO) Take 1 tablet by mouth at bedtime.    [provider]  ?budesonide-formoterol (SYMBICORT) 160-4.5 MCG/ACT inhaler Inhale 2 puffs  into the lungs 2 (two) times daily. 03/14/21   Kuneff, Renee A, DO  ?candesartan (ATACAND) 4 MG tablet TAKE 1 TABLET BY MOUTH EVERY DAY ?Patient taking differently: Take 4 mg by mouth daily. 06/16/21   SwazilandJordan, Peter M, MD  ?cephALEXin (KEFLEX) 500 MG capsule Take 1 capsule (500 mg total) by mouth 4 (four) times daily for 11 days. 06/22/21 07/03/21  Cora CollumPaige, Victoria J, DO  ?cholecalciferol (VITAMIN D) 1000 units tablet Take 1,000 Units by mouth 2 (two) times daily.    [provider]  ?Coenzyme Q10 (CO Q 10 PO) Take 1 capsule by mouth daily.    [provider]  ?diclofenac sodium (VOLTAREN) 1 % GEL Apply 2-4 g topically daily as needed (as directed to painful sites).     [provider]  ?doxycycline (AVIDOXY) 100 MG tablet Take 1 tablet (100 mg total) by mouth 2 (two) times daily for 11 days. 06/22/21 07/03/21  Cora CollumPaige, Victoria J, DO  ?Ipratropium Bromide (ATROVENT NA) Place 1 spray into the nose daily as needed.    [provider]  ?levalbuterol (XOPENEX) 1.25 MG/3ML nebulizer solution Take 1.25 mg by nebulization every 8 (eight) hours as needed for wheezing or shortness of breath.    [provider]  ?levocetirizine (XYZAL) 5 MG tablet Take 1 tablet (5 mg total) by mouth every evening. 03/14/21   Felix PaciniKuneff, Renee A, DO  ?Menthol-Camphor (TIGER BALM ARTHRITIS RUB EX) Apply 1 application. topically at bedtime as needed (shoulder pain).    [provider]  ?metoprolol succinate (TOPROL-XL) 50 MG 24 hr tablet TAKE 1 TABLET BY MOUTH EVERY DAY WITH OR IMMEDIATELY FOLLOWING A MEAL ?Patient taking differently: Take 50 mg by mouth daily. 06/16/21   [place]azilandJordan, Peter M, MD  ?mometasone (NASONEX) 50 MCG/ACT nasal spray Place 2 sprays into the nose every morning. ?Patient taking differently: Place 2 sprays into the nose daily as needed (allergies). 03/14/21   Kuneff, Renee A, DO  ?montelukast (SINGULAIR) 10 MG tablet Take 1 tablet (10 mg total) by mouth at bedtime. 03/14/21   Felix PaciniKuneff, Renee  A, DO  ?Multiple Vitamin (MULTIVITAMIN) tablet Take 1 tablet by mouth daily.    [provider]  ?Multiple Vitamins-Minerals (ZINC PO) Take 1 tablet by mouth at bedtime.    [provider]  ?nitroGLYCERIN (NITROSTAT) 0.4 MG SL tablet Place 1 tablet (0.4 mg total) under the tongue every 5 (five) minutes as needed for chest pain. 06/16/20 08/20/21  SwazilandJordan, Peter M, MD  ?senna (SENOKOT) 8.6 MG tablet Take 1 tablet by mouth every other day.    [provider]  ?traMADol (ULTRAM) 50 MG tablet Take 50 mg by mouth every 6 (six) hours as needed (pain). 03/05/20   [provider]  ? ? ?Allergies:  Compazine [prochlorperazine edisylate], Advil [ibuprofen], and Aspirin ? ?Review of Systems: Negative except per HPI. ? ?Physical Exam: ?Alert and oriented, NAD ?Head and neck: no masses, normal  alignment ?CV: pulse intact ?Pulm: no increased work of breathing, respirations even and unlabored ?Abdomen: non-distended ?Extremities: extremities warm and well perfused ? ?LABS: ?Recent Results (from the past 2160 hour(s))  ?Culture, blood (routine x 2)     Status: None  ? Collection Time: 06/19/21  1:00 PM  ? Specimen: BLOOD  ?Result Value Ref Range  ? Specimen Description BLOOD SITE NOT SPECIFIED   ? Special Requests    ?  BOTTLES DRAWN AEROBIC AND ANAEROBIC Blood Culture results may not be optimal due to an inadequate volume of blood received in culture bottles  ? Culture    ?  NO GROWTH 5 DAYS ?Performed at [facility], [address], [place], [place] [address] ?  ? Report Status 06/24/2021 FINAL   ?Basic metabolic panel     Status: None  ? Collection Time: 06/19/21  1:59 PM  ?Result Value Ref Range  ? Sodium 136 135 - 145 mmol/L  ? Potassium 3.7 3.5 - 5.1 mmol/L  ? Chloride 105 98 - 111 mmol/L  ? CO2 25 22 - 32 mmol/L  ? Glucose, Bld 97 70 - 99 mg/dL  ?  Comment: Glucose reference range applies only to samples taken after fasting for at least 8 hours.  ? BUN 10 8 - 23 mg/dL  ? Creatinine, Ser  0.67 0.44 - 1.00 mg/dL  ? Calcium 10.2 8.9 - 10.3 mg/dL  ? GFR, Estimated >60 >60 mL/min  ?  Comment: (NOTE) ?Calculated using the CKD-EPI Creatinine Equation (2021) ?  ? Anion gap 6 5 - 15  ?  Comment: P

## 2021-06-30 NOTE — Anesthesia Postprocedure Evaluation (Signed)
Anesthesia Post Note ? ?Patient: Alyssa Phillips ? ?Procedure(s) Performed: IRRIGATION AND DEBRIDEMENT left elbow (Left) ? ?  ? ?Patient location during evaluation: PACU ?Anesthesia Type: General ?Level of consciousness: awake and alert ?Pain management: pain level controlled ?Vital Signs Assessment: post-procedure vital signs reviewed and stable ?Respiratory status: spontaneous breathing and respiratory function stable ?Cardiovascular status: stable ?Postop Assessment: no apparent nausea or vomiting ?Anesthetic complications: no ? ? ?No notable events documented. ? ?Last Vitals:  ?Vitals:  ? 06/30/21 2205 06/30/21 2210  ?BP: (!) 157/86   ?Pulse: 60 (!) 55  ?Resp: 15 13  ?Temp:    ?SpO2: 97% 95%  ?  ?Last Pain:  ?Vitals:  ? 06/30/21 2205  ?TempSrc:   ?PainSc: 0-No pain  ? ? ?  ?  ?  ?  ?  ?  ? ?Cordie Beazley DANIEL ? ? ? ? ?

## 2021-06-30 NOTE — Anesthesia Preprocedure Evaluation (Addendum)
Anesthesia Evaluation  ?Patient identified by MRN, date of birth, ID band ?Patient awake ? ? ? ?Reviewed: ?Allergy & Precautions, NPO status , Patient's Chart, lab work & pertinent test results ? ?History of Anesthesia Complications ?Negative for: history of anesthetic complications ? ?Airway ?Mallampati: II ? ?TM Distance: >3 FB ?Neck ROM: Full ? ? ? Dental ?no notable dental hx. ?(+) Dental Advisory Given ?  ?Pulmonary ?asthma ,  ?  ?Pulmonary exam normal ? ? ? ? ? ? ? Cardiovascular ?hypertension, Pt. on medications ?+ CAD, + Past MI and + Cardiac Stents  ?Normal cardiovascular exam ? ?Study Conclusions  ? ?- Left ventricle: The cavity size was normal. There was mild focal  ???basal hypertrophy of the septum. Systolic function was mildly to  ???moderately reduced. The estimated ejection fraction was in the  ???range of 40% to 45%. Wall motion abnormalities noted below. The  ???study is indeterminate for the evaluation of LV diastolic  ???function. Acoustic contrast opacification revealed no evidence  ???ofthrombus.  ?- Regional wall motion abnormality: Akinesis of the apical anterior  ???and apical myocardium; hypokinesis of the mid anterior, mid  ???anteroseptal, apical septal, and apical lateral myocardium.  ?- Aortic valve: There was no regurgitation.  ?- Mitral valve: Structurally normal valve. No echocardiographic  ???evidence for prolapse. Transvalvular velocity was within the  ???normal range. There was no evidence for stenosis. There was no  ???regurgitation.  ?- Tricuspid valve: There was no significant regurgitation.  ?- Pulmonic valve: There was no significant regurgitation.  ? ?Impressions:  ? ?- -LV EF mild to moderately reduced with focal wall motion  ???abnormalities. Hypokinesis of anterior and anteroseptal walls and  ???apex. Echo contrast used to better evaluate wall motion and  ???exclude LV thrombus.  ???-No evidence of mitral valve prolapse or regurgitation.   ???-Technically difficult study. Right sided structures not well  ???visualized but appear grossly normal.  ?  ?Neuro/Psych ?negative neurological ROS ?   ? GI/Hepatic ?negative GI ROS, Neg liver ROS,   ?Endo/Other  ?Morbid obesity ? Renal/GU ?negative Renal ROS  ? ?  ?Musculoskeletal ?negative musculoskeletal ROS ?(+)  ? Abdominal ?  ?Peds ? Hematology ?negative hematology ROS ?(+)   ?Anesthesia Other Findings ? ? Reproductive/Obstetrics ? ?  ? ? ? ? ? ? ? ? ? ? ? ? ? ?  ?  ? ? ? ? ? ? ? ?Anesthesia Physical ?Anesthesia Plan ? ?ASA: 3 ? ?Anesthesia Plan: MAC  ? ?Post-op Pain Management: Regional block*  ? ?Induction:  ? ?PONV Risk Score and Plan: 2 and Ondansetron and Propofol infusion ? ?Airway Management Planned: Natural Airway and Simple Face Mask ? ?Additional Equipment:  ? ?Intra-op Plan:  ? ?Post-operative Plan:  ? ?Informed Consent: I have reviewed the patients History and Physical, chart, labs and discussed the procedure including the risks, benefits and alternatives for the proposed anesthesia with the patient or authorized representative who has indicated his/her understanding and acceptance.  ? ? ? ?Dental advisory given ? ?Plan Discussed with: Anesthesiologist, CRNA and Surgeon ? ?Anesthesia Plan Comments:   ? ? ? ? ? ?Anesthesia Quick Evaluation ? ?

## 2021-06-30 NOTE — Discharge Instructions (Signed)
  Orthopaedic Hand Surgery Discharge Instructions  WEIGHT BEARING STATUS: Non weight bearing on operative extremity  DRESSING CARE: Please keep your dressing/splint/cast clean and dry until your follow-up appointment. You may shower by placing a waterproof covering over your dressing/splint/cast. Contact your surgeon if your splint/cast gets wet. It will need to be changed to prevent skin breakdown.  PAIN CONTROL: First line medications for post operative pain control are Tylenol (acetaminophen) and Motrin (ibuprofen) if you are able to take these medications. If you have been prescribed a medication these can be taken as breakthrough pain medications. Please note that some narcotic pain medication has acetaminophen added and you should never consume more than 4,000mg of acetaminophen in 24-hour period. Please note that if you are given Toradol (ketorolac) you should not take similar medications such as ibuprofen or naproxen.  DISCHARGE MEDICATIONS: If you have been prescribed medication it was sent electronically to your pharmacy. No changes have been made to your home medications.  ICE/ELEVATION: Ice and elevate your injured extremity as needed. Avoid direct contact of ice with skin.   BANDAGE FEELS TOO TIGHT: If your bandage feels too tight, first make sure you are elevating your fingers as much as possible. The outer layer of the bandage can be unwrapped and reapplied more loosely. If no improvement, you may carefully cut the inner layer longitudinally until the pressure has resolved and then rewrap the outer layer. If you are not comfortable with these instructions, please call the office and the bandage can be changed for you.   FOLLOW UP: You will be called after surgery with an appointment date and time, however if you have not received a phone call within 3 days, please call during regular office hours at [phone] to schedule a post operative appointment.  Please Seek Medical Attention  if: Call MD for: pain or pressure in chest, jaw, arm, back, neck  Call MD for: temperature greater than 101 F for more than 24 hrs Call MD for: difficulty breathing Call MD for: incision redness, bleeding, drainage  Call MD for: palpitations or feeling that the heart is racing  Call MD for: increased swelling in arm, leg, ankle, or abdomen  Call MD for: lightheadedness, dizziness, fainting Call 911 or go to ER for any medical emergency if you are not able to get in touch with your doctor   J. Reid Adrea Sherpa, MD Orthopaedic Hand Surgeon [facility] Office number: [phone] [address] [place], [place] [address]  

## 2021-07-01 ENCOUNTER — Encounter (HOSPITAL_COMMUNITY): Payer: Self-pay | Admitting: Orthopedic Surgery

## 2021-07-04 DIAGNOSIS — Z1231 Encounter for screening mammogram for malignant neoplasm of breast: Secondary | ICD-10-CM | POA: Diagnosis not present

## 2021-07-04 LAB — HM MAMMOGRAPHY

## 2021-07-05 ENCOUNTER — Telehealth: Payer: Self-pay

## 2021-07-05 LAB — AEROBIC/ANAEROBIC CULTURE W GRAM STAIN (SURGICAL/DEEP WOUND): Culture: NO GROWTH

## 2021-07-05 NOTE — Telephone Encounter (Signed)
Received Mammogram results from [facility] on 07/05/21. Will place on PCP desk for review. ? ?

## 2021-07-05 NOTE — Telephone Encounter (Signed)
Please inform patient her mammogram is normal.  

## 2021-07-05 NOTE — Telephone Encounter (Signed)
Spoke with pt regarding results and instructions.   

## 2021-07-07 LAB — AEROBIC/ANAEROBIC CULTURE W GRAM STAIN (SURGICAL/DEEP WOUND)
Gram Stain: NONE SEEN
Gram Stain: NONE SEEN

## 2021-07-10 ENCOUNTER — Other Ambulatory Visit: Payer: Self-pay | Admitting: Cardiology

## 2021-07-15 ENCOUNTER — Telehealth (INDEPENDENT_AMBULATORY_CARE_PROVIDER_SITE_OTHER): Payer: BC Managed Care – PPO | Admitting: Family Medicine

## 2021-07-15 ENCOUNTER — Encounter: Payer: Self-pay | Admitting: Family Medicine

## 2021-07-15 DIAGNOSIS — U071 COVID-19: Secondary | ICD-10-CM

## 2021-07-15 MED ORDER — MOLNUPIRAVIR EUA 200MG CAPSULE: 4.0000 | ORAL_CAPSULE | Freq: Two times a day (BID) | ORAL | 0 refills | Status: AC

## 2021-07-15 MED ORDER — PREDNISONE 20 MG PO TABS: ORAL_TABLET | ORAL | 0 refills | Status: AC

## 2021-07-15 NOTE — Progress Notes (Signed)
? ? ?MyChart Video Visit ? ? ? ?Virtual Visit via Video Note  ? ?This visit type was conducted due to national recommendations for restrictions regarding the COVID-19 Pandemic (e.g. social distancing) in an effort to limit this patient's exposure and mitigate transmission in our community. This patient is at least at moderate risk for complications without adequate follow up. This format is felt to be most appropriate for this patient at this time. Physical exam was limited by quality of the video and audio technology used for the visit. CMA was able to get the patient set up on a video visit. ? ?Patient location: Home. Patient and provider in visit ?Provider location: Office ? ?I discussed the limitations of evaluation and management by telemedicine and the availability of in person appointments. The patient expressed understanding and agreed to proceed. ? ?Visit Date: 07/15/2021 ? ?Today's healthcare provider: Angelena Sole, MD  ? ? ? ?Subjective:  ? ? Patient ID: Alyssa Phillips, female    DOB: Oct 06, 1955, 66 y.o.   MRN: [id number] ? ?Chief Complaint  ?Patient presents with  ? Covid Positive  ?  Took two test this morning at 8 am ?Sx started yesterday after getting off work, was really tired  ? Fever  ?  Low grade fever yesterday, 99.6  ? Generalized Body Aches  ? Headache  ?  Started this morning  ? Sore Throat  ?  Started this morning  ? Chills  ?  Started this morning ?  ? Night Sweats  ? ? ?HPI ?Chief complaint: covid ?Symptom onset: yesterday ?Pertinent positives: temp 99.6, body aches, HA, sore throat, chills, sweats, but some wheezing and does have asthma which can flare. ?Pertinent negatives: no sob ?Treatments tried: inhalers. ?Vaccine status: UTD ?Sick exposure: none-but hosp for infection elbow  ? ?Past Medical History:  ?Diagnosis Date  ? Allergic rhinitis   ? Arthritis   ? Osteoarthrits-knees-hx. RTKA  ? Asthma   ? environmental agents induced asthma  ? Carpal tunnel syndrome of left wrist 02/19/2020   ? Carpal tunnel syndrome of right wrist 02/19/2020  ? Coronary artery disease   ? Family history of adverse reaction to anesthesia   ? daughter very sensitive to anesthesia  ? GERD (gastroesophageal reflux disease)   ? Heart murmur   ? hx. mitral valve proplapse-mostly asymptomatic-->not apprectiaed n echo  ? Hepatic steatosis 03/05/2008  ? [place]  ? Hyperlipidemia   ? Hypertension   ? Morbid obesity due to excess calories (HCC)   ? Pain in joint of left shoulder 06/12/2017  ? Pain in left foot 04/04/2018  ? Pain of joint of left ankle and foot 12/18/2019  ? STEMI (ST elevation myocardial infarction) (HCC) 10/2017  ? LAD treated with DESx1  ? UTI (urinary tract infection)   ? Vitamin D deficiency   ? ? ?Past Surgical History:  ?Procedure Laterality Date  ? 5th finger surgery Right 1990  ? unknown injury  ? CARDIAC CATHETERIZATION    ? CARPAL TUNNEL RELEASE  08/2020  ? CHOLECYSTECTOMY    ? COLONOSCOPY WITH PROPOFOL N/A 06/08/2016  ? Procedure: COLONOSCOPY WITH PROPOFOL;  Surgeon: Dorena Cookey, MD;  Location: [facility] ENDOSCOPY;  Service: Endoscopy;  Laterality: N/A;  ? CORONARY STENT INTERVENTION N/A 11/01/2017  ? Procedure: CORONARY STENT INTERVENTION;  Surgeon: Swaziland, Peter M, MD;  Location: [facility] INVASIVE CV LAB;  Service: Cardiovascular;  Laterality: N/A;  Resolute Onyx 4.5 mm x12 mm  ? CORONARY/GRAFT ACUTE MI REVASCULARIZATION N/A 11/01/2017  ? Procedure:  Coronary/Graft Acute MI Revascularization;  Surgeon: Swaziland, Peter M, MD;  Location: [facility] INVASIVE CV LAB;  Service: Cardiovascular;  Laterality: N/A;  ? I & D EXTREMITY Left 06/30/2021  ? Procedure: IRRIGATION AND DEBRIDEMENT left elbow;  Surgeon: Gomez Cleverly, MD;  Location: [facility] OR;  Service: Orthopedics;  Laterality: Left;  ? KNEE ARTHROSCOPY  05/02/2012  ? x2 right/ x1 left  ? LEFT HEART CATH AND CORONARY ANGIOGRAPHY N/A 11/01/2017  ? Procedure: LEFT HEART CATH AND CORONARY ANGIOGRAPHY;  Surgeon: Swaziland, Peter M, MD;  Location: [facility] INVASIVE CV LAB;  Service: Cardiovascular;   Laterality: N/A;  ? REPLACEMENT TOTAL KNEE Right 10/2010  ? ROOT CANAL    ? TOTAL KNEE ARTHROPLASTY  05/10/2012  ? Procedure: TOTAL KNEE ARTHROPLASTY;  Surgeon: Loanne Drilling, MD;  Location: [facility] ORS;  Service: Orthopedics;  Laterality: Left;  ? TUBAL LIGATION    ? UMBILICAL HERNIA REPAIR    ? ? ?Outpatient Medications Prior to Visit  ?Medication Sig Dispense Refill  ? acetaminophen (TYLENOL) 500 MG tablet Take 1,000 mg by mouth every 8 (eight) hours.    ? albuterol (VENTOLIN HFA) 108 (90 Base) MCG/ACT inhaler Inhale 1-2 puffs into the lungs every 6 (six) hours as needed for wheezing or shortness of breath. 6 g 11  ? Ascorbic Acid (VITAMIN C PO) Take 1 tablet by mouth daily.    ? aspirin EC 81 MG EC tablet Take 1 tablet (81 mg total) by mouth daily.    ? atorvastatin (LIPITOR) 80 MG tablet TAKE 1 TABLET BY MOUTH DAILY AT 6 PM. (Patient taking differently: Take 80 mg by mouth every evening.) 90 tablet 0  ? azelastine (OPTIVAR) 0.05 % ophthalmic solution Place 1 drop into both eyes daily as needed (itching).    ? B Complex Vitamins (B COMPLEX 1 PO) Take 1 tablet by mouth at bedtime.    ? budesonide-formoterol (SYMBICORT) 160-4.5 MCG/ACT inhaler Inhale 2 puffs into the lungs 2 (two) times daily. 3 each 1  ? candesartan (ATACAND) 4 MG tablet TAKE 1 TABLET BY MOUTH EVERY DAY (Patient taking differently: Take 4 mg by mouth daily.) 90 tablet 0  ? cholecalciferol (VITAMIN D) 1000 units tablet Take 1,000 Units by mouth 2 (two) times daily.    ? Coenzyme Q10 (CO Q 10 PO) Take 1 capsule by mouth daily.    ? diclofenac sodium (VOLTAREN) 1 % GEL Apply 2-4 g topically daily as needed (as directed to painful sites).     ? Ipratropium Bromide (ATROVENT NA) Place 1 spray into the nose daily as needed.    ? levalbuterol (XOPENEX) 1.25 MG/3ML nebulizer solution Take 1.25 mg by nebulization every 8 (eight) hours as needed for wheezing or shortness of breath.    ? levocetirizine (XYZAL) 5 MG tablet Take 1 tablet (5 mg total) by mouth  every evening. 90 tablet 3  ? Menthol-Camphor (TIGER BALM ARTHRITIS RUB EX) Apply 1 application. topically at bedtime as needed (shoulder pain).    ? metoprolol succinate (TOPROL-XL) 50 MG 24 hr tablet TAKE 1 TABLET BY MOUTH EVERY DAY WITH OR IMMEDIATELY FOLLOWING A MEAL (Patient taking differently: Take 50 mg by mouth daily.) 90 tablet 0  ? mometasone (NASONEX) 50 MCG/ACT nasal spray Place 2 sprays into the nose every morning. (Patient taking differently: Place 2 sprays into the nose daily as needed (allergies).) 17 g 5  ? montelukast (SINGULAIR) 10 MG tablet Take 1 tablet (10 mg total) by mouth at bedtime. 90 tablet 3  ? Multiple  Vitamin (MULTIVITAMIN) tablet Take 1 tablet by mouth daily.    ? Multiple Vitamins-Minerals (ZINC PO) Take 1 tablet by mouth at bedtime.    ? nitroGLYCERIN (NITROSTAT) 0.4 MG SL tablet PLACE 1 TABLET UNDER THE TONGUE EVERY 5 MINUTES AS NEEDED FOR CHEST PAIN. 25 tablet 3  ? senna (SENOKOT) 8.6 MG tablet Take 1 tablet by mouth every other day.    ? traMADol (ULTRAM) 50 MG tablet Take 50 mg by mouth every 6 (six) hours as needed (pain).    ? amoxicillin (AMOXIL) 500 MG capsule amoxicillin 500 mg capsule ? TAKE 4 CAPSULES 1 HOUR BEFORE APPOINTMENT (Patient not taking: Reported on 07/15/2021)    ? ?No facility-administered medications prior to visit.  ? ? ?Allergies  ?Allergen Reactions  ? Compazine [Prochlorperazine Edisylate] Other (See Comments)  ?  Eyes rolled back, tongue curled up  ? Advil [Ibuprofen] Hives  ? Aspirin Other (See Comments)  ?  HAS to be EC aspirin!!  ? ? ? ?   ?Objective:  ?  ? ?Physical Exam  ?Vitals and nursing note reviewed.  ?Constitutional:   ?   General:  is not in acute distress. Mildly ill ?   Appearance: Normal appearance.  ?HENT:  ?   Head: Normocephalic. Congested some ?Pulmonary:  ?   Effort: No respiratory distress.  ?Skin: ?   General: Skin is dry.  ?   Coloration: Skin is not pale.  ?Neurological:  ?   Mental Status: Pt is alert and oriented to person,  place, and time.  ?Psychiatric:     ?   Mood and Affect: Mood normal.  ? ?There were no vitals taken for this visit. ? ?Wt Readings from Last 3 Encounters:  ?06/30/21 265 lb (120.2 kg)  ?06/19/21 269 lb 13.5 oz

## 2021-07-15 NOTE — Patient Instructions (Signed)
Meds have been sent the the pharmacy °You can take tylenol for pain/fevers °If worsening symptoms, let us know or go to the Emergency room  ° ° °

## 2021-07-28 ENCOUNTER — Telehealth: Payer: Self-pay | Admitting: Family Medicine

## 2021-07-28 NOTE — Telephone Encounter (Signed)
Spoke with patient regarding results/recommendations.  

## 2021-07-28 NOTE — Telephone Encounter (Signed)
Pt called about wanting to schedule for 2nd shingles shot. ? ?She couldn't get her 2nd shot due to having Covid at the time. ? ?Pt wants to know will this count as her 2nd shingles shot. I scheduled pt for NURSE visit next week for shingles shot. ? ?Pt cell: [phone]  ?

## 2021-08-01 ENCOUNTER — Ambulatory Visit (INDEPENDENT_AMBULATORY_CARE_PROVIDER_SITE_OTHER): Payer: BC Managed Care – PPO

## 2021-08-01 DIAGNOSIS — Z23 Encounter for immunization: Secondary | ICD-10-CM

## 2021-08-03 ENCOUNTER — Encounter: Payer: Self-pay | Admitting: Internal Medicine

## 2021-08-03 ENCOUNTER — Ambulatory Visit: Payer: BC Managed Care – PPO

## 2021-08-03 ENCOUNTER — Telehealth: Payer: Self-pay

## 2021-08-03 ENCOUNTER — Ambulatory Visit: Payer: BC Managed Care – PPO | Admitting: Internal Medicine

## 2021-08-03 VITALS — BP 104/70 | HR 75 | Ht 69.0 in | Wt 265.8 lb

## 2021-08-03 DIAGNOSIS — E213 Hyperparathyroidism, unspecified: Secondary | ICD-10-CM | POA: Diagnosis not present

## 2021-08-03 NOTE — Patient Instructions (Signed)
-   Stay Hydrated  - Avoid OVER THE COUNTER calcium tablets - Make sure you consume 2-3 servings of calcium a day in your diet   

## 2021-08-03 NOTE — Telephone Encounter (Signed)
Patient was here on 4/17 and rec'd shingles shot.  She states at the injection site, are is red and warm to touch. ?Is this normal?  Should she do anything? ? ?Please call [phone] ?

## 2021-08-03 NOTE — Progress Notes (Signed)
? ?Name: Alyssa Phillips  ?MRN/ DOB: [id number], 1956-01-21    ?Age/ Sex: 66 y.o., female   ? ? ?PCP: Kuneff, Renee A, DO   ?Reason for Endocrinology Evaluation: Hypercalcemia   ?   ?Initial Endocrinology Clinic Visit: 09/05/2019  ? ? ?PATIENT IDENTIFIER: Alyssa Phillips is a 66 y.o., female with a past medical history of  CAD ( 10/2017) , HTN and Dyslipidemia.  She has followed with [facility] since 09/05/2019 for consultative assistance with management of her Hyperthyroidism ? ?HISTORICAL SUMMARY:  ?Ms. Augello indicates that she was first diagnosed with hypercalcemia in 06/2019 with a serum calcium of 10.9 mg/dL ( Corrected 80.32) during routine check up  ? ?she has a history of a leg fracture in the past ( S/P slip injury) and has osteopenia based on DXA from 02/2019 ? ?Ca/Cr ratio was 0.0054 ? ?SUBJECTIVE:  ? ?Today (08/03/2021):  [name] is here for a follow up on hypercalcemia.  ? ? ?She had left arm cellulitis , Treated with Abx  and I&D. This was followed by COVID infection ~ 4 weeks ago  ?She has been having aches and pains  ?Denies polydipsia  ?No renal stones ?Denies constipation  ? ? ?Not on OTC calcium  ?Consumes 2  Servings of dairy  ?She is on vitamin D  ? ? ?HISTORY:  ?Past Medical History:  ?Past Medical History:  ?Diagnosis Date  ? Allergic rhinitis   ? Arthritis   ? Osteoarthrits-knees-hx. RTKA  ? Asthma   ? environmental agents induced asthma  ? Carpal tunnel syndrome of left wrist 02/19/2020  ? Carpal tunnel syndrome of right wrist 02/19/2020  ? Coronary artery disease   ? Family history of adverse reaction to anesthesia   ? daughter very sensitive to anesthesia  ? GERD (gastroesophageal reflux disease)   ? Heart murmur   ? hx. mitral valve proplapse-mostly asymptomatic-->not apprectiaed n echo  ? Hepatic steatosis 03/05/2008  ? [place]  ? Hyperlipidemia   ? Hypertension   ? Morbid obesity due to excess calories (HCC)   ? Pain in joint of left shoulder 06/12/2017  ? Pain in  left foot 04/04/2018  ? Pain of joint of left ankle and foot 12/18/2019  ? STEMI (ST elevation myocardial infarction) (HCC) 10/2017  ? LAD treated with DESx1  ? UTI (urinary tract infection)   ? Vitamin D deficiency   ? ?Past Surgical History:  ?Past Surgical History:  ?Procedure Laterality Date  ? 5th finger surgery Right 1990  ? unknown injury  ? CARDIAC CATHETERIZATION    ? CARPAL TUNNEL RELEASE  08/2020  ? CHOLECYSTECTOMY    ? COLONOSCOPY WITH PROPOFOL N/A 06/08/2016  ? Procedure: COLONOSCOPY WITH PROPOFOL;  Surgeon: Dorena Cookey, MD;  Location: [facility] ENDOSCOPY;  Service: Endoscopy;  Laterality: N/A;  ? CORONARY STENT INTERVENTION N/A 11/01/2017  ? Procedure: CORONARY STENT INTERVENTION;  Surgeon: Swaziland, Peter M, MD;  Location: [facility] INVASIVE CV LAB;  Service: Cardiovascular;  Laterality: N/A;  Resolute Onyx 4.5 mm x12 mm  ? CORONARY/GRAFT ACUTE MI REVASCULARIZATION N/A 11/01/2017  ? Procedure: Coronary/Graft Acute MI Revascularization;  Surgeon: Swaziland, Peter M, MD;  Location: [facility] INVASIVE CV LAB;  Service: Cardiovascular;  Laterality: N/A;  ? I & D EXTREMITY Left 06/30/2021  ? Procedure: IRRIGATION AND DEBRIDEMENT left elbow;  Surgeon: Gomez Cleverly, MD;  Location: [facility] OR;  Service: Orthopedics;  Laterality: Left;  ? KNEE ARTHROSCOPY  05/02/2012  ? x2 right/ x1 left  ? LEFT HEART  CATH AND CORONARY ANGIOGRAPHY N/A 11/01/2017  ? Procedure: LEFT HEART CATH AND CORONARY ANGIOGRAPHY;  Surgeon: Swaziland, Peter M, MD;  Location: [facility] INVASIVE CV LAB;  Service: Cardiovascular;  Laterality: N/A;  ? REPLACEMENT TOTAL KNEE Right 10/2010  ? ROOT CANAL    ? TOTAL KNEE ARTHROPLASTY  05/10/2012  ? Procedure: TOTAL KNEE ARTHROPLASTY;  Surgeon: Loanne Drilling, MD;  Location: [facility] ORS;  Service: Orthopedics;  Laterality: Left;  ? TUBAL LIGATION    ? UMBILICAL HERNIA REPAIR    ? ?Social History:  reports that she has never smoked. She has never used smokeless tobacco. She reports current alcohol use. She reports that she does not use  drugs. ?Family History:  ?Family History  ?Problem Relation Age of Onset  ? Hypertension Mother   ? Arthritis Mother   ? Lymphoma Mother   ? Asthma Mother   ? Cancer Mother   ? Hearing loss Mother   ? Miscarriages / [place] Mother   ? COPD Father   ? Alcohol abuse Father   ? Arthritis Father   ? Hearing loss Father   ? Hypertension Father   ? Miscarriages / Stillbirths Sister   ? Arthritis Maternal Grandmother   ? Hearing loss Maternal Grandmother   ? Breast cancer Maternal Grandmother 71  ? Arthritis Maternal Grandfather   ? Diabetes Maternal Grandfather   ? Hypertension Maternal Grandfather   ? Stroke Maternal Grandfather   ? Arthritis Paternal Grandmother   ? Diabetes Paternal Grandmother   ? Hearing loss Paternal Grandmother   ? Hearing loss Paternal Grandfather   ? Learning disabilities Paternal Grandfather   ? ? ? ?HOME MEDICATIONS: ?Allergies as of 08/03/2021   ? ?   Reactions  ? Compazine [prochlorperazine Edisylate] Other (See Comments)  ? Eyes rolled back, tongue curled up  ? Advil [ibuprofen] Hives  ? Aspirin Other (See Comments)  ? HAS to be EC aspirin!!  ? ?  ? ?  ?Medication List  ?  ? ?  ? Accurate as of August 03, 2021  7:42 AM. If you have any questions, ask your nurse or doctor.  ?  ?  ? ?  ? ?acetaminophen 500 MG tablet ?Commonly known as: TYLENOL ?Take 1,000 mg by mouth every 8 (eight) hours. ?  ?albuterol 108 (90 Base) MCG/ACT inhaler ?Commonly known as: VENTOLIN HFA ?Inhale 1-2 puffs into the lungs every 6 (six) hours as needed for wheezing or shortness of breath. ?  ?amoxicillin 500 MG capsule ?Commonly known as: AMOXIL ?  ?aspirin 81 MG EC tablet ?Take 1 tablet (81 mg total) by mouth daily. ?  ?atorvastatin 80 MG tablet ?Commonly known as: LIPITOR ?TAKE 1 TABLET BY MOUTH DAILY AT 6 PM. ?What changed: when to take this ?  ?ATROVENT NA ?Place 1 spray into the nose daily as needed. ?  ?azelastine 0.05 % ophthalmic solution ?Commonly known as: OPTIVAR ?Place 1 drop into both eyes daily as  needed (itching). ?  ?B COMPLEX 1 PO ?Take 1 tablet by mouth at bedtime. ?  ?budesonide-formoterol 160-4.5 MCG/ACT inhaler ?Commonly known as: SYMBICORT ?Inhale 2 puffs into the lungs 2 (two) times daily. ?  ?candesartan 4 MG tablet ?Commonly known as: ATACAND ?TAKE 1 TABLET BY MOUTH EVERY DAY ?  ?cholecalciferol 1000 units tablet ?Commonly known as: VITAMIN D ?Take 1,000 Units by mouth 2 (two) times daily. ?  ?CO Q 10 PO ?Take 1 capsule by mouth daily. ?  ?diclofenac sodium 1 % Gel ?Commonly known as:  VOLTAREN ?Apply 2-4 g topically daily as needed (as directed to painful sites). ?  ?levalbuterol 1.25 MG/3ML nebulizer solution ?Commonly known as: XOPENEX ?Take 1.25 mg by nebulization every 8 (eight) hours as needed for wheezing or shortness of breath. ?  ?levocetirizine 5 MG tablet ?Commonly known as: XYZAL ?Take 1 tablet (5 mg total) by mouth every evening. ?  ?metoprolol succinate 50 MG 24 hr tablet ?Commonly known as: TOPROL-XL ?TAKE 1 TABLET BY MOUTH EVERY DAY WITH OR IMMEDIATELY FOLLOWING A MEAL ?What changed: See the new instructions. ?  ?mometasone 50 MCG/ACT nasal spray ?Commonly known as: NASONEX ?Place 2 sprays into the nose every morning. ?What changed:  ?when to take this ?reasons to take this ?  ?montelukast 10 MG tablet ?Commonly known as: SINGULAIR ?Take 1 tablet (10 mg total) by mouth at bedtime. ?  ?multivitamin tablet ?Take 1 tablet by mouth daily. ?  ?nitroGLYCERIN 0.4 MG SL tablet ?Commonly known as: NITROSTAT ?PLACE 1 TABLET UNDER THE TONGUE EVERY 5 MINUTES AS NEEDED FOR CHEST PAIN. ?  ?senna 8.6 MG tablet ?Commonly known as: SENOKOT ?Take 1 tablet by mouth every other day. ?  ?TIGER BALM ARTHRITIS RUB EX ?Apply 1 application. topically at bedtime as needed (shoulder pain). ?  ?traMADol 50 MG tablet ?Commonly known as: ULTRAM ?Take 50 mg by mouth every 6 (six) hours as needed (pain). ?  ?VITAMIN C PO ?Take 1 tablet by mouth daily. ?  ?ZINC PO ?Take 1 tablet by mouth at bedtime. ?  ? ?   ? ? ? ? ?OBJECTIVE:  ? ?PHYSICAL EXAM: ?VS: BP 104/70 (BP Location: Right Wrist, Patient Position: Sitting, Cuff Size: Large)   Pulse 75   Ht  (1.753 m)   Wt 265 lb 12.8 oz (120.6 kg) Comment: Patient reported  SpO2 98%

## 2021-08-03 NOTE — Telephone Encounter (Signed)
Pt advised of recommendations. She understands to proceed to nearest ED or call 911 if shortness of breath develops. ?

## 2021-08-03 NOTE — Progress Notes (Signed)
Total volume 1,100.  Started 08-02-2021 at 7:00 am ended 08-03-2021 at 7:00 am. ?

## 2021-08-03 NOTE — Telephone Encounter (Signed)
?  How to Treat Normal Immunization Reactions ?Local Reactions at Injection Site:  ? Cold Pack: For initial pain or swelling at the injection site, apply a cold pack to the area for 20  ?minutes each as needed. After 48 hours, if pain still present switch to warm compress or heating pad.   ? Pain Medicine: acetaminophen (e.g., Tylenol) or ibuprofen (e.g., Advil) as long as patient is not allergic to the above or has been told to avoid one of the above secondary to liver or kidney disease. ? Localized Hives: Apply 1% hydrocortisone cream (OTC) once or twice.  ?Fever: ? For fevers above 102? F (39? C), acetaminophen or ibuprofen. If younger than 6 months, avoid  ?ibuprofen. Always give ibuprofen with food.  ? For all fevers: extra fluids.  ?General Reactions:  ? Usually due to a sore injection site.  ?- Decreased appetite and activity level can also occur, as well as feeling fatigued ? These symptoms do not need any treatment and will usually resolve in 24 to 72 hours. ? ?

## 2021-08-03 NOTE — Telephone Encounter (Signed)
Please advise 

## 2021-08-04 LAB — CREATININE, URINE, 24 HOUR: Creatinine, 24H Ur: 1.2 g/(24.h) (ref 0.50–2.15)

## 2021-08-04 LAB — CALCIUM, URINE, 24 HOUR: Calcium, 24H Urine: 276 mg/24 h — ABNORMAL HIGH

## 2021-08-15 DIAGNOSIS — M19072 Primary osteoarthritis, left ankle and foot: Secondary | ICD-10-CM | POA: Diagnosis not present

## 2021-08-24 DIAGNOSIS — J4530 Mild persistent asthma, uncomplicated: Secondary | ICD-10-CM | POA: Diagnosis not present

## 2021-08-24 DIAGNOSIS — H1045 Other chronic allergic conjunctivitis: Secondary | ICD-10-CM | POA: Diagnosis not present

## 2021-08-24 DIAGNOSIS — J300 Vasomotor rhinitis: Secondary | ICD-10-CM | POA: Diagnosis not present

## 2021-09-16 ENCOUNTER — Other Ambulatory Visit: Payer: Self-pay | Admitting: Family Medicine

## 2021-09-16 ENCOUNTER — Ambulatory Visit
Admission: RE | Admit: 2021-09-16 | Discharge: 2021-09-16 | Disposition: A | Discharge status: Home / Self Care | Payer: BC Managed Care – PPO | Source: Ambulatory Visit | Attending: Internal Medicine | Admitting: Internal Medicine

## 2021-09-16 ENCOUNTER — Ambulatory Visit: Payer: BC Managed Care – PPO | Admitting: Family Medicine

## 2021-09-16 ENCOUNTER — Encounter: Payer: Self-pay | Admitting: Family Medicine

## 2021-09-16 VITALS — BP 122/77 | HR 96 | Temp 102.5°F

## 2021-09-16 DIAGNOSIS — R5081 Fever presenting with conditions classified elsewhere: Secondary | ICD-10-CM

## 2021-09-16 DIAGNOSIS — R112 Nausea with vomiting, unspecified: Secondary | ICD-10-CM | POA: Diagnosis not present

## 2021-09-16 DIAGNOSIS — R062 Wheezing: Secondary | ICD-10-CM | POA: Diagnosis not present

## 2021-09-16 DIAGNOSIS — J4540 Moderate persistent asthma, uncomplicated: Secondary | ICD-10-CM

## 2021-09-16 DIAGNOSIS — E213 Hyperparathyroidism, unspecified: Secondary | ICD-10-CM

## 2021-09-16 DIAGNOSIS — M8589 Other specified disorders of bone density and structure, multiple sites: Secondary | ICD-10-CM | POA: Diagnosis not present

## 2021-09-16 DIAGNOSIS — B349 Viral infection, unspecified: Secondary | ICD-10-CM

## 2021-09-16 DIAGNOSIS — Z780 Asymptomatic menopausal state: Secondary | ICD-10-CM | POA: Diagnosis not present

## 2021-09-16 MED ORDER — ONDANSETRON 4 MG PO TBDP: 4.0000 mg | ORAL_TABLET | Freq: Three times a day (TID) | ORAL | 0 refills | Status: DC | PRN

## 2021-09-16 MED ORDER — ALBUTEROL SULFATE HFA 108 (90 BASE) MCG/ACT IN AERS: 1.0000 | INHALATION_SPRAY | RESPIRATORY_TRACT | 11 refills | Status: DC | PRN

## 2021-09-16 MED ORDER — AZITHROMYCIN 250 MG PO TABS: ORAL_TABLET | ORAL | 0 refills | Status: AC

## 2021-09-16 MED ORDER — ONDANSETRON HCL 4 MG/2ML IJ SOLN
4.0000 mg | Freq: Once | INTRAMUSCULAR | Status: AC
  Administered 2021-09-16: 4 mg via INTRAMUSCULAR

## 2021-09-16 NOTE — Patient Instructions (Signed)
Adenovirus Infection, Adult Adenoviruses are common viruses that cause many types of infections. These viruses may affect the nose, throat, windpipe, and lungs (respiratory system), as well as other parts of the body, including the eyes, stomach, bowels, bladder, and brain. The most common type of adenovirus infection is the common cold. Usually, adenovirus infections are not severe. However, they can become severe in people who have another health problem that makes it hard to fight off infection. What are the causes? This condition is caused by an adenovirus entering your body. Some ways this can happen are: Touching a surface or object that has an adenovirus on it and then touching your mouth, nose, or eyes with unwashed hands. Coming in close physical contact with someone who has this type of infection. This may happen if you hug or shake hands with the person. Breathing in droplets that fly through the air when someone who has the infection talks, coughs, or sneezes. Having contact with stool (feces) that has the virus in it. Using a swimming pool that does not have enough chlorine in it. Chlorine is a chemical that kills germs. Adenoviruses can live outside the body for a long time. They spread easily from person to person (are contagious). What increases the risk? The following factors may make you more likely to develop this condition: Spending a lot of time in places where there are many people. These include schools, summer camps, day care centers, community centers, and training centers for people who join the [employer]. Being an older adult. Having a weak immune system. This is the body's defense system. Having a disease of the respiratory system. Having a heart condition. What are the signs or symptoms? Adenovirus infections usually cause flu-like symptoms. When the virus enters the body, symptoms of this condition can take up to 14 days to develop. Symptoms may include: Having lung  and breathing problems, such as: Cough. Trouble breathing. Runny nose or stuffy (congested) nose. Feeling aches and pains, including: Headache. Stiff neck. Sore throat. Ear pain or congested ears. Stomachache. Having digestive problems, such as: Feeling nauseous or vomiting. Having diarrhea. Having a fever. Having eye problems, such as pink eye (conjunctivitis), causing inflammation and redness. Rash. Less common symptoms include: Being confused or not knowing the time of day or where you are (disoriented). Having blood in your urine or having pain while urinating. How is this diagnosed? This condition may be diagnosed based on your symptoms and a physical exam. Your health care provider may order tests to make sure your symptoms are not caused by another problem. Tests can include: Blood tests. Urine tests. Stool tests. Chest X-ray. Tests of tissue or mucus from your throat. How is this treated? This condition goes away on its own with time. Treatment for this condition involves managing symptoms until they go away. Your health care provider may recommend: Getting plenty of rest. Drinking more fluids than usual. Taking over-the-counter medicine to help relieve a sore throat, fever, or headache. Follow these instructions at home:  Lifestyle Do not drink alcohol. Do not use any products that contain nicotine or tobacco, such as cigarettes, e-cigarettes, and chewing tobacco. If you need help quitting, ask your health care provider. General instructions Take over-the-counter and prescription medicines only as told by your health care provider. Rest at home until your symptoms go away. Drink enough fluid to keep your urine pale yellow. If you have a sore throat, gargle with a salt-water mixture 3-4 times a day or as needed. To  make a salt-water mixture, completely dissolve -1 tsp (3-6 g) of salt in 1 cup (237 mL) of warm water. Keep all follow-up visits as told by your health  care provider. This is important. Return to your normal activities as told by your health care provider. Ask your health care provider what activities are safe for you. How is this prevented?     Adenoviruses often are not killed by cleaning products and can remain on surfaces for a long time. To help prevent becoming infected or spreading infection: Wash your hands often with soap and water. If soap and water are not available, use hand sanitizer. Cover your mouth when you cough. Cover your nose and mouth when you sneeze. Do not touch your eyes, nose, or mouth with unwashed hands, and wash hands after touching these areas. Clean commonly used objects often. Do not use a swimming pool that does not have enough chlorine in it. Avoid close contact with people who are sick. Do not go to school or work when you are sick. Do not share cups or eating utensils. Where to find more information Centers for Disease Control and Prevention: http://www.wolf.info/ Contact a health care provider if: Your symptoms stay the same after 10 days. Your symptoms get worse. You cannot eat or drink without vomiting. Get help right away if: You have trouble breathing or you are breathing quickly. Your skin, lips, or fingernails look blue. Your heart is beating fast. You become confused. You lose consciousness. These symptoms may represent a serious problem that is an emergency. Do not wait to see if the symptoms will go away. Get medical help right away. Call your local emergency services (911 in the [place] Do not drive yourself to the hospital. Summary The most common type of adenovirus infection is the common cold. This condition goes away on its own with time. Adenoviruses can live outside the body for a long time. They spread easily from person to person (are contagious). Rest at home until your symptoms go away. Contact a health care provider if your symptoms stay the same after 10 days. This information is not  intended to replace advice given to you by your health care provider. Make sure you discuss any questions you have with your health care provider. Document Revised: 10/23/2018 Document Reviewed: 10/23/2018 Elsevier Patient Education  [employer] [place].

## 2021-09-16 NOTE — Progress Notes (Signed)
Alyssa Phillips , 06/09/1955, 66 y.o., female MRN: [id number] Patient Care Team    Relationship Specialty Notifications Start End  Natalia LeatherwoodKuneff, Courtnie Brenes A, DO PCP - General Family Medicine  12/04/17   Dorena CookeyHayes, John, MD (Inactive) Consulting Physician Gastroenterology  12/04/17   Ollen GrossAluisio, Frank, MD Consulting Physician Orthopedic Surgery  12/04/17   Sidney AceSharma, Ranjan, MD Referring Physician Allergy  12/12/19   SwazilandJordan, Peter M, MD Consulting Physician Cardiology  12/12/19   Shamleffer, Konrad DoloresIbtehal Jaralla, MD Consulting Physician Endocrinology  12/12/19     Chief Complaint  Patient presents with   Cough    Pt c/o cough, HA, n/v, chills, fever, sweats, x 2 hrs     Subjective: Pt presents for an OV with complaints of nausea, headache, chills, fever, sweats that just started a couple hours ago.  She denies vomiting or diarrhea.  She states symptoms came on rather quickly.  She did have a part of a tuna [employer] today, but she was feeling a little off prior to that and did not eat much of it.  She did recently travel to [place] with her family.  She reports her granddaughter had had a sore throat recently.  Patient had gone to the bathroom here and became rather dizzy with profuse sweating.  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     03/14/2021   11:10 AM 12/11/2019    9:07 AM 12/09/2018    8:09 AM 04/08/2018    5:09 PM 01/07/2018    8:58 AM  Depression screen PHQ 2/9  Decreased Interest 0 0 0 0 0  Down, Depressed, Hopeless 0 0 0 0 0  PHQ - 2 Score 0 0 0 0 0  Altered sleeping  0     Tired, decreased energy  0     Change in appetite  0     Feeling bad or failure about yourself   0     Trouble concentrating  0     Moving slowly or fidgety/restless  0     Suicidal thoughts  0     PHQ-9 Score  0       Allergies  Allergen Reactions   Compazine [Prochlorperazine Edisylate] Other (See Comments)    Eyes rolled back, tongue curled up   Advil [Ibuprofen] Hives   Aspirin Other (See Comments)     HAS to be EC aspirin!!   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 RTKA   Asthma    environmental agents induced asthma   Carpal tunnel syndrome of left wrist 02/19/2020   Carpal tunnel syndrome of right wrist 02/19/2020   Coronary artery disease    Family history of adverse reaction to anesthesia    daughter very sensitive to anesthesia   GERD (gastroesophageal reflux disease)    Heart murmur    hx. mitral valve proplapse-mostly asymptomatic-->not apprectiaed n echo   Hepatic steatosis 03/05/2008   US   Hyperlipidemia    Hypertension    Morbid obesity due to excess calories (HCC)    Pain in joint of left shoulder 06/12/2017   Pain in left foot  04/04/2018   Pain of joint of left ankle and foot 12/18/2019   STEMI (ST elevation myocardial infarction) (HCC) 10/2017   LAD treated with DESx1   UTI (urinary tract infection)    Vitamin D deficiency    Past Surgical History:  Procedure Laterality Date   5th finger surgery Right 1990   unknown injury   CARDIAC CATHETERIZATION     CARPAL TUNNEL RELEASE  08/2020   CHOLECYSTECTOMY     COLONOSCOPY WITH PROPOFOL N/A 06/08/2016   Procedure: COLONOSCOPY WITH PROPOFOL;  Surgeon: Dorena Cookey, MD;  Location: [facility] ENDOSCOPY;  Service: Endoscopy;  Laterality: N/A;   CORONARY STENT INTERVENTION N/A 11/01/2017   Procedure: CORONARY STENT INTERVENTION;  Surgeon: Swaziland, Peter M, MD;  Location: [facility] INVASIVE CV LAB;  Service: Cardiovascular;  Laterality: N/A;  Resolute Onyx 4.5 mm x12 mm   CORONARY/GRAFT ACUTE MI REVASCULARIZATION N/A 11/01/2017   Procedure: Coronary/Graft Acute MI Revascularization;  Surgeon: Swaziland, Peter M, MD;  Location: [facility] INVASIVE  CV LAB;  Service: Cardiovascular;  Laterality: N/A;   I & D EXTREMITY Left 06/30/2021   Procedure: IRRIGATION AND DEBRIDEMENT left elbow;  Surgeon: Gomez Cleverly, MD;  Location: [facility] OR;  Service: Orthopedics;  Laterality: Left;   KNEE ARTHROSCOPY  05/02/2012   x2 right/ x1 left   LEFT HEART CATH AND CORONARY ANGIOGRAPHY N/A 11/01/2017   Procedure: LEFT HEART CATH AND CORONARY ANGIOGRAPHY;  Surgeon: Swaziland, Peter M, MD;  Location: [facility] INVASIVE CV LAB;  Service: Cardiovascular;  Laterality: N/A;   REPLACEMENT TOTAL KNEE Right 10/2010   ROOT CANAL     TOTAL KNEE ARTHROPLASTY  05/10/2012   Procedure: TOTAL KNEE ARTHROPLASTY;  Surgeon: Loanne Drilling, MD;  Location: [facility] ORS;  Service: Orthopedics;  Laterality: Left;   TUBAL LIGATION     UMBILICAL HERNIA REPAIR     Family History  Problem Relation Age of Onset   Hypertension Mother    Arthritis Mother    Lymphoma Mother    Asthma Mother    Cancer Mother    Hearing loss Mother    Miscarriages / [place] Mother    COPD Father    Alcohol abuse Father    Arthritis Father    Hearing loss Father    Hypertension Father    Miscarriages / [place] Sister    Arthritis Maternal Grandmother    Hearing loss Maternal Grandmother    Breast cancer Maternal Grandmother 71   Arthritis Maternal Grandfather    Diabetes Maternal Grandfather    Hypertension Maternal Grandfather    Stroke Maternal Grandfather    Arthritis Paternal Grandmother    Diabetes Paternal Grandmother    Hearing loss Paternal Grandmother    Hearing loss Paternal Grandfather    Learning disabilities Paternal Grandfather    Allergies as of 09/16/2021       Reactions   Compazine [prochlorperazine Edisylate] Other (See Comments)   Eyes rolled back, tongue curled up   Advil [ibuprofen] Hives   Aspirin Other (See Comments)   HAS to be EC aspirin!!        Medication List        Accurate as of September 16, 2021 11:59 PM. If you have any questions, ask your nurse or doctor.           acetaminophen 500 MG tablet Commonly known as: TYLENOL Take 1,000 mg by mouth every 8 (eight) hours.   albuterol 108 (90 Base) MCG/ACT inhaler Commonly known as: VENTOLIN HFA Inhale 1-2 puffs into the lungs every  4 (four) hours as needed for wheezing or shortness of breath. What changed: when to take this Changed by: Loyola Mast, MD   amoxicillin 500 MG capsule Commonly known as: AMOXIL   aspirin EC 81 MG tablet Take 1 tablet (81 mg total) by mouth daily.   atorvastatin 80 MG tablet Commonly known as: LIPITOR TAKE 1 TABLET BY MOUTH DAILY AT 6 PM. What changed: when to take this   ATROVENT NA Place 1 spray into the nose daily as needed.   azelastine 0.05 % ophthalmic solution Commonly known as: OPTIVAR Place 1 drop into both eyes daily as needed (itching).   azithromycin 250 MG tablet Commonly known as: ZITHROMAX Take 2 tablets on day 1, then 1 tablet daily on days 2 through 5 Started by: Felix Pacini, DO   B COMPLEX 1 PO Take 1 tablet by mouth at bedtime.   budesonide-formoterol 160-4.5 MCG/ACT inhaler Commonly known as: SYMBICORT Inhale 2 puffs into the lungs 2 (two) times daily.   candesartan 4 MG tablet Commonly known as: ATACAND TAKE 1 TABLET BY MOUTH EVERY DAY   cholecalciferol 1000 units tablet Commonly known as: VITAMIN D Take 1,000 Units by mouth 2 (two) times daily.   CO Q 10 PO Take 1 capsule by mouth daily.   diclofenac sodium 1 % Gel Commonly known as: VOLTAREN Apply 2-4 g topically daily as needed (as directed to painful sites).   levalbuterol 1.25 MG/3ML nebulizer solution Commonly known as: XOPENEX Take 1.25 mg by nebulization every 8 (eight) hours as needed for wheezing or shortness of breath.   levocetirizine 5 MG tablet Commonly known as: XYZAL Take 1 tablet (5 mg total) by mouth every evening.   metoprolol succinate 50 MG 24 hr tablet Commonly known as: TOPROL-XL TAKE 1 TABLET BY MOUTH EVERY DAY WITH OR IMMEDIATELY  FOLLOWING A MEAL What changed: See the new instructions.   mometasone 50 MCG/ACT nasal spray Commonly known as: NASONEX Place 2 sprays into the nose every morning. What changed:  when to take this reasons to take this   montelukast 10 MG tablet Commonly known as: SINGULAIR Take 1 tablet (10 mg total) by mouth at bedtime.   multivitamin tablet Take 1 tablet by mouth daily.   nitroGLYCERIN 0.4 MG SL tablet Commonly known as: NITROSTAT PLACE 1 TABLET UNDER THE TONGUE EVERY 5 MINUTES AS NEEDED FOR CHEST PAIN.   ondansetron 4 MG disintegrating tablet Commonly known as: ZOFRAN-ODT Take 1 tablet (4 mg total) by mouth every 8 (eight) hours as needed for nausea or vomiting. Started by: Felix Pacini, DO   senna 8.6 MG tablet Commonly known as: SENOKOT Take 1 tablet by mouth every other day.   TIGER BALM ARTHRITIS RUB EX Apply 1 application. topically at bedtime as needed (shoulder pain).   traMADol 50 MG tablet Commonly known as: ULTRAM Take 50 mg by mouth every 6 (six) hours as needed (pain).   VITAMIN C PO Take 1 tablet by mouth daily.   ZINC PO Take 1 tablet by mouth at bedtime.        All past medical history, surgical history, allergies, family history, immunizations andmedications were updated in the EMR today and reviewed under the history and medication portions of their EMR.     ROS Negative, with the exception of above mentioned in HPI   Objective:  BP 122/77   Pulse 96   Temp (!) 102.5 F (39.2 C) (Axillary)   SpO2 94%  There is no height or  weight on file to calculate BMI. Physical Exam Vitals and nursing note reviewed.  Constitutional:      General: She is not in acute distress.    Appearance: Normal appearance. She is obese. She is ill-appearing and diaphoretic. She is not toxic-appearing.     Comments: Febrile  HENT:     Head: Normocephalic and atraumatic.     Right Ear: Tympanic membrane, ear canal and external ear normal. There is no impacted  cerumen.     Left Ear: Tympanic membrane, ear canal and external ear normal. There is no impacted cerumen.     Nose: No congestion or rhinorrhea.     Mouth/Throat:     Mouth: Mucous membranes are moist.     Pharynx: No oropharyngeal exudate or posterior oropharyngeal erythema.  Eyes:     General:        Right eye: No discharge.        Left eye: No discharge.     Extraocular Movements: Extraocular movements intact.     Conjunctiva/sclera: Conjunctivae normal.     Pupils: Pupils are equal, round, and reactive to light.  Cardiovascular:     Rate and Rhythm: Normal rate and regular rhythm.     Heart sounds: No murmur heard. Pulmonary:     Effort: Pulmonary effort is normal. No respiratory distress.     Breath sounds: Wheezing present. No rhonchi or rales.  Musculoskeletal:     Cervical back: Neck supple. No tenderness.  Lymphadenopathy:     Cervical: No cervical adenopathy.  Skin:    Findings: No rash.  Neurological:     Mental Status: She is alert and oriented to person, place, and time. Mental status is at baseline.  Psychiatric:        Mood and Affect: Mood normal.        Behavior: Behavior normal.        Thought Content: Thought content normal.        Judgment: Judgment normal.     No results found.  No results found for this or any previous visit (from the past 24 hour(s)).  Assessment/Plan: HAJRA PORT is a 66 y.o. female present for OV for  Nausea and vomiting, unspecified vomiting type/fever/wheezing/illness Suspect viral illness.  Exam is normal with the exception of wheezing, fever and dizziness. She had became significantly weak and dizzy after voiding in the restroom.  She required assistance ambulating to her room.  She was very nauseated.  Sweating with a fan on. IM Zofran provided for her.  She noted improvement within a few minutes. Wheezing was present and patient had a breathing treatment.  Wheezing subsided with treatment. -COVID-negative -  ondansetron (ZOFRAN) injection 4 mg - methylPREDNISolone acetate (DEPO-MEDROL) injection 80 mg -Rest.  Hydrate. -Encouraged albuterol 1 to 2 puffs every 4 hours if needed. -Z-Pak prescribed in the event needed over the weekend for asthma exacerbation from viral illness  Reviewed expectations re: course of current medical issues. Discussed self-management of symptoms. Outlined signs and symptoms indicating need for more acute intervention. Patient verbalized understanding and all questions were answered. Patient received an After-Visit Summary.    No orders of the defined types were placed in this encounter.  Meds ordered this encounter  Medications   ondansetron (ZOFRAN) injection 4 mg   azithromycin (ZITHROMAX) 250 MG tablet    Sig: Take 2 tablets on day 1, then 1 tablet daily on days 2 through 5    Dispense:  6 tablet  Refill:  0   ondansetron (ZOFRAN-ODT) 4 MG disintegrating tablet    Sig: Take 1 tablet (4 mg total) by mouth every 8 (eight) hours as needed for nausea or vomiting.    Dispense:  20 tablet    Refill:  0   methylPREDNISolone acetate (DEPO-MEDROL) injection 80 mg   Referral Orders  No referral(s) requested today   > 50 Minutes was dedicated to this patient's encounter> face-to-face time with patient.  Patient was acutely ill and nauseated with dizziness, requiring urgent hands-on care.   Note is dictated utilizing voice recognition software. Although note has been proof read prior to signing, occasional typographical errors still can be missed. If any questions arise, please do not hesitate to call for verification.   electronically signed by:  Felix Pacini, DO  [facility] - [place]

## 2021-09-16 NOTE — Progress Notes (Signed)
Received call from phone triage nurse. Patient was seen earlier today by Dr. Claiborne Billings. Apparently, she was told to use her nebulizer at home for 48 hours. Upon returning home, she found she was out of medication and her nebulizer was no longer working. Requested I call in both the medication and an order for a new nebulizer.  Review of the medication list shows it has been over 2 years since the patient was prescribed Xopenex solution. I will leave the decision about the need for a nebulizer for Dr. Claiborne Billings to determine. Studies show MDI is equally effective to nebulizer. I will renew her albuterol MDI. She should use 2 puffs every 4 hours while awake for 48 hours, then resume PRN use.  1. Moderate persistent asthma without complication  - albuterol (VENTOLIN HFA) 108 (90 Base) MCG/ACT inhaler; Inhale 1-2 puffs into the lungs every 4 (four) hours as needed for wheezing or shortness of breath.  Dispense: 6 g; Refill: 11   Loyola Mast, MD

## 2021-09-19 ENCOUNTER — Telehealth: Payer: Self-pay

## 2021-09-19 DIAGNOSIS — J4540 Moderate persistent asthma, uncomplicated: Secondary | ICD-10-CM

## 2021-09-19 MED ORDER — METHYLPREDNISOLONE ACETATE 80 MG/ML IJ SUSP
80.0000 mg | Freq: Once | INTRAMUSCULAR | Status: AC
  Administered 2021-09-16: 80 mg via INTRAMUSCULAR

## 2021-09-19 NOTE — Telephone Encounter (Signed)
[facility] - Client TELEPHONE ADVICE RECORD [facility] Patient Name: IDIL GILLOCK Gender: Female DOB: 1955-05-16 Age: 66 Y 39 M 3 D Return Phone Number: [phone] (Primary), [phone] (Secondary) Address: [place]  [address] Client [facility] - Client Client Site [facility] - Day Provider Raoul Pitch, Tyrrell Type Call Who Is Calling Patient / Member / Family / Caregiver Call Type Triage / Clinical Relationship To Patient Self Return Phone Number [phone] (Primary) Chief Complaint Prescription Refill or Medication Request (non symptomatic) Reason for Call Medication Question / Request Initial Comment Caller states the Dr told her to continue the neb treatment for the next 48 hours. Caller states her nebulizer is broken and she would like to see if she can get an Rx for a new one. Translation No Nurse Assessment Nurse: Lovena Le, RN, Santiago Glad Date/Time Eilene Ghazi Time): 09/16/2021 8:50:59 PM Confirm and document reason for call. If symptomatic, describe symptoms. ---Caller states she was seen at 4 pm today dx with upper resp virus s.s chills and fever 102, hx of asthma. The pcp told her to continue the neb treatment q 4 hrs for the next 48 hours. Caller states her nebulizer is broken and she would like to see if she can get an Rx for a new one. She had zofran, steroids, and neb tx in the office, rx for zpak, prednisone and zofran. She is requesting Albuterol neb and medication [facility] [address], [place],  [address] ph [phone]. Denies need for triage. Does the patient have any new or worsening symptoms? ---No Disp. Time Eilene Ghazi Time) Disposition Final User 09/16/2021 8:08:38 PM Send To Nurse Lonia Farber, RN, Greg 09/16/2021 8:08:59 PM Send To Nurse Lonia Farber, RN, Greg 09/16/2021 8:09:10 PM Send To Nurse Lonia Farber, RN, Greg 09/16/2021 8:09:27 PM Send To  Nurse Lonia Farber, RN, Greg 09/16/2021 8:09:42 PM Send To Nurse Lonia Farber, RN, Greg 09/16/2021 8:59:11 PM Called On-Call Provider Lovena Le, RN, Santiago Glad 09/16/2021 9:03:08 PM Clinical Call Yes Lovena Le, RN, Ralene Bathe NOTE: All timestamps contained within this report are represented as [place] Standard Time. CONFIDENTIALTY NOTICE: This fax transmission is intended only for the addressee. It contains information that is legally privileged, confidential or otherwise protected from use or disclosure. If you are not the intended recipient, you are strictly prohibited from reviewing, disclosing, copying using or disseminating any of this information or taking any action in reliance on or regarding this information. If you have received this fax in error, please notify us immediately by telephone so that we can arrange for its return to [place]. Phone: [phone], Toll-Free: [phone], Fax: [phone] Page: 2 of 2 Call Id: [id number] Tye Phone DateTime Result/ Outcome Message Type Notes Orlie Dakin MD [id number] 09/16/2021 8:59:11 PM Called On Call Provider - Reached Doctor Paged Arlester Marker "Lennox Grumbles"- MD 09/16/2021 9:02:22 PM Spoke with On Call - General Message Result Per on call he will call her Albuterol Inhaler to do 2 puffs q 4 w/a. Caller updated and verb understanding

## 2021-09-21 MED ORDER — NEBULIZER CUP/TUBING DEVI: 1.0000 | Freq: Three times a day (TID) | 5 refills | Status: AC | PRN

## 2021-09-21 MED ORDER — ALBUTEROL SULFATE (2.5 MG/3ML) 0.083% IN NEBU: 2.5000 mg | INHALATION_SOLUTION | Freq: Four times a day (QID) | RESPIRATORY_TRACT | 1 refills | Status: DC | PRN

## 2021-09-21 NOTE — Telephone Encounter (Addendum)
noted 

## 2021-09-21 NOTE — Addendum Note (Signed)
Addended by: Felix Pacini A on: 09/21/2021 02:33 PM   Modules accepted: Orders

## 2021-09-21 NOTE — Telephone Encounter (Signed)
Pt informed

## 2021-09-21 NOTE — Telephone Encounter (Signed)
Pt checking on status of this message. She is wanting this script sent to   [id number] - [place], [place] - [address]  [address] [place], [place] [address]  Phone:  [phone]  Fax:  [phone]  DEA #:  [id number]

## 2021-09-21 NOTE — Telephone Encounter (Signed)
Called in [facility]bulizer machine[facility] tubing and [facility] to [facility]   If the nebulizer machine is not covered, she can purchase one without a prescription on [employer].   The nebulizer machine prescription printed, therefore we will need to fax to them.

## 2021-09-21 NOTE — Telephone Encounter (Signed)
Pt stated that she already has an albuterol inhaler but would like to know if she can have a ned tx.

## 2021-09-26 NOTE — Progress Notes (Signed)
Cardiology Office Note    Date:  09/30/2021   ID:  AKYLAH HASCALL, DOB 01/12/1956, MRN [id number]  PCP:  Natalia Leatherwood, DO  Cardiologist:  Dr. [place]  Chief Complaint  Patient presents with   Coronary Artery Disease    History of Present Illness:  Alyssa Phillips is a 66 y.o. female with past medical history of hypertension, HLD, asthma, reported history of mitral valve prolapse who was  admitted for anterior STEMI on 11/01/2017.  Patient was taken urgently to Cath Lab which showed severe single-vessel disease with 95% proximal LAD treated with DES x1.  EF 45 to 50% by LV gram.  Echocardiogram showed EF 40 to 45%. There was no evidence of MV prolapse.  Postprocedure, patient was placed on aspirin and Brilinta and the high-dose statin.  She developed a cough on lisinopril. She was also placed on beta-blocker and ARB.  Her brilinta was discontinued at one year.  She has been seen by endocrinology for familial hypocalciuric hypercalcemia. She was seen by PCP on 6/2 for acute viral illness with fever. In March she was treated by Ortho for infection of left elbow. She is still teaching. Has rare chest pain only when she overdoes it. No dyspnea. No palpitations. No edema. Walks 8-10K steps a day.    Past Medical History:  Diagnosis Date   Allergic rhinitis    Arthritis    Osteoarthrits-knees-hx. RTKA   Asthma    environmental agents induced asthma   Carpal tunnel syndrome of left wrist 02/19/2020   Carpal tunnel syndrome of right wrist 02/19/2020   Coronary artery disease    Family history of adverse reaction to anesthesia    daughter very sensitive to anesthesia   GERD (gastroesophageal reflux disease)    Heart murmur    hx. mitral valve proplapse-mostly asymptomatic-->not apprectiaed n echo   Hepatic steatosis 03/05/2008   [place]   Hyperlipidemia    Hypertension    Morbid obesity due to excess calories (HCC)    Pain in joint of left shoulder 06/12/2017   Pain in left foot  04/04/2018   Pain of joint of left ankle and foot 12/18/2019   STEMI (ST elevation myocardial infarction) (HCC) 10/2017   LAD treated with DESx1   UTI (urinary tract infection)    Vitamin D deficiency     Past Surgical History:  Procedure Laterality Date   5th finger surgery Right 1990   unknown injury   CARDIAC CATHETERIZATION     CARPAL TUNNEL RELEASE  08/2020   CHOLECYSTECTOMY     COLONOSCOPY WITH PROPOFOL N/A 06/08/2016   Procedure: COLONOSCOPY WITH PROPOFOL;  Surgeon: Dorena Cookey, MD;  Location[facility] END[facility]COPY;  Service: Endoscopy;  Laterality: N/A;   CORONARY STENT INTERVENTION N/A 11/01/2017   Procedure: CORONARY STENT INTERVENTION;  Surgeon: Swaziland, Kynnedy Carreno M, MD;  Location: [facility] INVASIVE CV LAB;  Service: Cardiovascular;  Laterality: N/A;  Resolute Onyx 4.5 mm x12 mm   CORONARY/GRAFT ACUTE MI REVASCULARIZATION N/A 11/01/2017   Procedure: Coronary/Graft Acute MI Revascularization;  Surgeon: [place], Zafirah Vanzee M, MD;  Location: [facility] INVASIVE CV LAB;  Service: Cardiovascular;  Laterality: N/A;   I & D EXTREMITY Left 06/30/2021   Procedure: IRRIGATION AND DEBRIDEMENT left elbow;  Surgeon: Gomez Cleverly, MD;  Location: [facility] OR;  Service: Orthopedics;  Laterality: Left;   KNEE ARTHROSCOPY  05/02/2012   x2 right/ x1 left   LEFT HEART CATH AND CORONARY ANGIOGRAPHY N/A 11/01/2017   Procedure: LEFT HEART CATH AND CORONARY ANGIOGRAPHY;  Surgeon: SwazilandJordan, Loraine Bhullar M, MD;  [facility];  Service: Cardiovascular;  Laterality: N/A;   REPLACEMENT TOTAL KNEE Right 10/2010   ROOT CANAL     TOTAL KNEE ARTHROPLASTY  05/10/2012   Procedure: TOTAL KNEE ARTHROPLASTY;  Surgeon: Loanne DrillingFrank V Aluisio, MD;  Location: [facility] ORS;  Service: Orthopedics;  Laterality: Left;   TUBAL LIGATION     UMBILICAL HERNIA REPAIR      Current Medications: Outpatient Medications Prior to Visit  Medication Sig Dispense Refill   acetaminophen (TYLENOL) 500 MG tablet Take 1,000 mg by mouth every 8 (eight) hours.     albuterol  (PROVENTIL) (2.5 MG/3ML) 0.083% nebulizer solution Take 3 mLs (2.5 mg total) by nebulization every 6 (six) hours as needed for wheezing or shortness of breath. 150 mL 1   albuterol (VENTOLIN HFA) 108 (90 Base) MCG/ACT inhaler Inhale 1-2 puffs into the lungs every 4 (four) hours as needed for wheezing or shortness of breath. 6 g 11   amoxicillin (AMOXIL) 500 MG capsule  (Patient not taking: Reported on 09/16/2021)     Ascorbic Acid (VITAMIN C PO) Take 1 tablet by mouth daily.     aspirin EC 81 MG EC tablet Take 1 tablet (81 mg total) by mouth daily.     azelastine (OPTIVAR) 0.05 % ophthalmic solution Place 1 drop into both eyes daily as needed (itching).     B Complex Vitamins (B COMPLEX 1 PO) Take 1 tablet by mouth at bedtime.     budesonide-formoterol (SYMBICORT) 160-4.5 MCG/ACT inhaler Inhale 2 puffs into the lungs 2 (two) times daily. 3 each 1   cholecalciferol (VITAMIN D) 1000 units tablet Take 1,000 Units by mouth 2 (two) times daily.     Coenzyme Q10 (CO Q 10 PO) Take 1 capsule by mouth daily.     diclofenac sodium (VOLTAREN) 1 % GEL Apply 2-4 g topically daily as needed (as directed to painful sites).      Ipratropium Bromide (ATROVENT NA) Place 1 spray into the nose daily as needed.     levalbuterol (XOPENEX) 1.25 MG/3ML nebulizer solution Take 1.25 mg by nebulization every 8 (eight) hours as needed for wheezing or shortness of breath.     levocetirizine (XYZAL) 5 MG tablet Take 1 tablet (5 mg total) by mouth every evening. 90 tablet 3   Menthol-Camphor (TIGER BALM ARTHRITIS RUB EX) Apply 1 application. topically at bedtime as needed (shoulder pain).     mometasone (NASONEX) 50 MCG/ACT nasal spray Place 2 sprays into the nose every morning. (Patient taking differently: Place 2 sprays into the nose daily as needed (allergies).) 17 g 5   montelukast (SINGULAIR) 10 MG tablet Take 1 tablet (10 mg total) by mouth at bedtime. 90 tablet 3   Multiple Vitamin (MULTIVITAMIN) tablet Take 1 tablet by  mouth daily.     Multiple Vitamins-Minerals (ZINC PO) Take 1 tablet by mouth at bedtime.     nitroGLYCERIN (NITROSTAT) 0.4 MG SL tablet PLACE 1 TABLET UNDER THE TONGUE EVERY 5 MINUTES AS NEEDED FOR CHEST PAIN. 25 tablet 3   ondansetron (ZOFRAN-ODT) 4 MG disintegrating tablet Take 1 tablet (4 mg total) by mouth every 8 (eight) hours as needed for nausea or vomiting. 20 tablet 0   Respiratory Therapy Supplies (NEBULIZER) DEVI 1 Device by Does not apply route 3 (three) times daily as needed. 1 each 5   senna (SENOKOT) 8.6 MG tablet Take 1 tablet by mouth every other day.     traMADol (ULTRAM) 50  MG tablet Take 50 mg by mouth every 6 (six) hours as needed (pain). (Patient not taking: Reported on 09/16/2021)     atorvastatin (LIPITOR) 80 MG tablet TAKE 1 TABLET BY MOUTH DAILY AT 6 PM. (Patient taking differently: Take 80 mg by mouth every evening.) 90 tablet 0   candesartan (ATACAND) 4 MG tablet TAKE 1 TABLET BY MOUTH EVERY DAY (Patient taking differently: Take 4 mg by mouth daily.) 90 tablet 0   metoprolol succinate (TOPROL-XL) 50 MG 24 hr tablet TAKE 1 TABLET BY MOUTH EVERY DAY WITH OR IMMEDIATELY FOLLOWING A MEAL (Patient taking differently: Take 50 mg by mouth daily.) 90 tablet 0   No facility-administered medications prior to visit.     Allergies:   Compazine [prochlorperazine edisylate], Advil [ibuprofen], and Aspirin   Social History   Socioeconomic History   Marital status: Married    Spouse name: Brad   Number of children: Not on file   Years of education: 16   Highest education level: Bachelor's degree (e.g., BA, AB, BS)  Occupational History   Not on file  Tobacco Use   Smoking status: Never   Smokeless tobacco: Never  Vaping Use   Vaping Use: Never used  Substance and Sexual Activity   Alcohol use: Yes    Comment: occ. 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[employer], Engineer, agricultural:      -smoke alarm in the home:Yes     - wears seatbelt: Yes     - Feels safe in their relationships: Yes   Social Determinants of Health   Financial Resource Strain: Low Risk  (01/01/2018)   Overall Financial Resource Strain (CARDIA)    Difficulty of Paying Living Expenses: Not hard at all  Food Insecurity: No Food Insecurity (01/01/2018)   Hunger Vital Sign    Worried About Running Out of Food in the Last Year: Never true    Ran Out of Food in the Last Year: Never true  Transportation Needs: No Transportation Needs (01/01/2018)   PRAPARE - Administrator, Civil Service (Medical): No    Lack of Transportation (Non-Medical): No  Physical Activity: Inactive (01/01/2018)   Exercise Vital Sign    Days of Exercise per Week: 0 days    Minutes of Exercise per Session: 0 min  Stress: No Stress Concern Present (01/01/2018)   [employer] of Occupational Health - Occupational Stress Questionnaire    Feeling of Stress : Only a little  Social Connections: Not on file     Family History:  The patient's family history includes Alcohol abuse in her father; Arthritis in her father, maternal grandfather, maternal grandmother, mother, and paternal grandmother; Asthma in her mother; Breast cancer (age of onset: 35) in her maternal grandmother; COPD in her father; Cancer in her mother; Diabetes in her maternal grandfather and paternal grandmother; Hearing loss in her father, maternal grandmother, mother, paternal grandfather, and paternal grandmother; Hypertension in her father, maternal grandfather, and mother; Learning disabilities in her paternal grandfather; Lymphoma in her mother; Miscarriages / Stillbirths in her mother and sister; Stroke in her maternal grandfather.   ROS:   Please see the history of present illness.    ROS All other systems reviewed and are negative.   PHYSICAL EXAM:   VS:  BP 122/82  Pulse 72   Ht  5\' 9"  (1.753 m)   Wt 265 lb 6.4 oz (120.4 kg)   SpO2 98%   BMI 39.19 kg/m    GEN: Well nourished, obesity, in no acute distress  HEENT: normal  Neck: no JVD, carotid bruits, or masses Cardiac: RRR; soft SEM RUSB.  No rubs, or gallops,no edema  Respiratory:  clear to auscultation bilaterally, normal work of breathing, no wheezing GI: soft, nontender, nondistended, + BS MS: no deformity or atrophy  Skin: warm and dry, no rash Neuro:  Alert and Oriented x 3, Strength and sensation are intact Psych: euthymic mood, full affect  Wt Readings from Last 3 Encounters:  09/30/21 265 lb 6.4 oz (120.4 kg)  08/03/21 265 lb 12.8 oz (120.6 kg)  06/30/21 265 lb (120.2 kg)      Studies/Labs Reviewed:   EKG:  EKG is ordered today.  NSR rate 72. Incomplete RBBB. Otherwise normal. I have personally reviewed and interpreted this study.    Recent Labs: 03/14/2021: ALT 19; TSH 2.35 06/22/2021: BUN 9; Creatinine, Ser 0.61; Hemoglobin 11.9; Platelets 167; Potassium 3.8; Sodium 141   Lipid Panel    Component Value Date/Time   CHOL 126 12/11/2019 0833   CHOL 113 06/23/2019 1120   TRIG 44.0 12/11/2019 0833   HDL 55.80 12/11/2019 0833   HDL 45 06/23/2019 1120   CHOLHDL 2 12/11/2019 0833   VLDL 8.8 12/11/2019 0833   LDLCALC 62 12/11/2019 0833   LDLCALC 56 06/23/2019 1120   LDLDIRECT 59.0 03/14/2021 1137    Additional studies/ records that were reviewed today include:   Cath: 11/01/17   Ost 1st Mrg lesion is 40% stenosed. Prox LAD lesion is 95% stenosed. Post intervention, there is a 0% residual stenosis. A drug-eluting stent was successfully placed using a STENT RESOLUTE ONYX 4.5X12. There is mild left ventricular systolic dysfunction. LV end diastolic pressure is mildly elevated. The left ventricular ejection fraction is 50-55% by visual estimate.   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 Mildly impaired LV function. EF 45-50% 3. Mildly elevated LVEDP 4. Successful PCI of  the proximal LAD with DES x 1     Recommend uninterrupted dual antiplatelet therapy with Aspirin 81mg  daily and Ticagrelor 90mg  twice daily for a minimum of 12 months (ACS - Class I recommendation).   2D echo 11/01/17 Study Conclusions   - Left ventricle: The cavity size was normal. There was mild focal   basal hypertrophy of the septum. Systolic function was mildly to   moderately reduced. The estimated ejection fraction was in the   range of 40% to 45%. Wall motion abnormalities noted below. The   study is indeterminate for the evaluation of LV diastolic   function. Acoustic contrast opacification revealed no evidence   ofthrombus. - Regional wall motion abnormality: Akinesis of the apical anterior   and apical myocardium; hypokinesis of the mid anterior, mid   anteroseptal, apical septal, and apical lateral myocardium. - Aortic valve: There was no regurgitation. - Mitral valve: Structurally normal valve. No echocardiographic   evidence for prolapse. Transvalvular velocity was within the   normal range. There was no evidence for stenosis. There was no   regurgitation. - Tricuspid valve: There was no significant regurgitation. - Pulmonic valve: There was no significant regurgitation.   Impressions:   - -LV EF mild to moderately reduced with focal wall motion   abnormalities. Hypokinesis of anterior and anteroseptal walls and   apex. Echo contrast used to better evaluate  wall motion and   exclude LV thrombus.   -No evidence of mitral valve prolapse or regurgitation.   -Technically difficult study. Right sided structures not well   visualized but appear grossly normal.       ASSESSMENT:    1. Coronary artery disease involving native coronary artery of native heart without angina pectoris   2. Essential hypertension   3. Mixed hyperlipidemia      PLAN:  In order of problems listed above:  CAD: s/p  anterior MI 11/01/17, underwent DES to LAD.  No other residual disease.  she is asymptomatic. Continue ASA.  Continue  high-dose statin.  On beta blocker and ARB. RX refilled.   Hyperlipidemia: Continue Lipitor. Last LDL 59  Hypertension: Blood pressure well controlled.    4.   Obesity. Continue efforts at weight loss.   Follow up in one year.   Medication Adjustments/Labs and Tests Ordered: Current medicines are reviewed at length with the patient today.  Concerns regarding medicines are outlined above.  Medication changes, Labs and Tests ordered today are listed in the Patient Instructions below. There are no Patient Instructions on file for this visit.   Signed, Lakenya Riendeau [place], MD  09/30/2021 8:16 AM    [facility] [address] [place], [place], [place]  [address] Phone: [phone]; Fax: [phone]

## 2021-09-26 NOTE — Telephone Encounter (Signed)
noted 

## 2021-09-26 NOTE — Telephone Encounter (Signed)
[facility] Day - Client Nonclinical Telephone Record  [facility] Client [facility] Day - Client Client Site [facility] - Day Provider Claiborne Billings, [place] Contact Type Call Who Is Calling Patient / Member / Family / Caregiver Caller Name [name] Phone Number [phone] Patient Name Alyssa Phillips Patient DOB [birth year] Call Type Message Only Information Provided Reason for Call Request for General Office Information Initial Comment Caller states she needs to leave a message for Dr. Claiborne Billings for tomorrow. Saw Dr. Claiborne Billings on last Friday. She had told her to continue using nebulizer for breathing issue at home. When she got home, breathing machine would not work and the medication for neb was outdated. [facility] could not get her a [facility]bul[facility] or a medication because they need prescription. Need Rx for neb and for medicine. Disp. Time Disposition Final User 09/20/2021 5:58:35 PM General Information Provided Yes Evern Core Call Closed By: Evern Core Transaction Date/Time: 09/20/2021 5:54:39 PM (ET)

## 2021-09-26 NOTE — Telephone Encounter (Signed)
These prescriptions were written on 09/21/2021. Please see phone notes

## 2021-09-26 NOTE — Telephone Encounter (Signed)
Please advise  Saw Dr. Raoul Pitch on last Friday. She had told her to continue using nebulizer for breathing issue at home. When she got home, breathing machine would not work and the medication for neb was outdated. [facility] could not get [facility] a [facility] or a medication because they need prescription. Need Rx for neb and for medicine.

## 2021-09-30 ENCOUNTER — Encounter: Payer: Self-pay | Admitting: Cardiology

## 2021-09-30 ENCOUNTER — Ambulatory Visit (INDEPENDENT_AMBULATORY_CARE_PROVIDER_SITE_OTHER): Payer: BC Managed Care – PPO | Admitting: Cardiology

## 2021-09-30 VITALS — BP 122/82 | HR 72 | Ht 69.0 in | Wt 265.4 lb

## 2021-09-30 DIAGNOSIS — E782 Mixed hyperlipidemia: Secondary | ICD-10-CM

## 2021-09-30 DIAGNOSIS — I2510 Atherosclerotic heart disease of native coronary artery without angina pectoris: Secondary | ICD-10-CM | POA: Diagnosis not present

## 2021-09-30 DIAGNOSIS — I10 Essential (primary) hypertension: Secondary | ICD-10-CM

## 2021-09-30 MED ORDER — ATORVASTATIN CALCIUM 80 MG PO TABS: 80.0000 mg | ORAL_TABLET | Freq: Every evening | ORAL | 3 refills | Status: DC

## 2021-09-30 MED ORDER — CANDESARTAN CILEXETIL 4 MG PO TABS
4.0000 mg | ORAL_TABLET | Freq: Every day | ORAL | 3 refills | Status: DC
Start: 2021-09-30 — End: 2022-09-04

## 2021-09-30 MED ORDER — METOPROLOL SUCCINATE ER 50 MG PO TB24: 50.0000 mg | ORAL_TABLET | Freq: Every day | ORAL | 3 refills | Status: DC

## 2021-09-30 NOTE — Patient Instructions (Signed)
Medication Instructions:  Your physician recommends that you continue on your current medications as directed. Please refer to the Current Medication list given to you today.  *If you need a refill on your cardiac medications before your next appointment, please call your pharmacy*   Follow-Up: At [facility], you and your health needs are our priority.  As part of our continuing mission to provide you with exceptional heart care, we have created designated Provider Care Teams.  These Care Teams include your primary Cardiologist (physician) and Advanced Practice Providers (APPs -  Physician Assistants and Nurse Practitioners) who all work together to provide you with the care you need, when you need it.  We recommend signing up for the patient portal called "MyChart".  Sign up information is provided on this After Visit Summary.  MyChart is used to connect with patients for Virtual Visits (Telemedicine).  Patients are able to view lab/test results, encounter notes, upcoming appointments, etc.  Non-urgent messages can be sent to your provider as well.   To learn more about what you can do with MyChart, go to [email or web address].    Your next appointment:   12 month(s)  The format for your next appointment:   In Person  Provider:   Peter Jordan, MD { 

## 2021-10-31 ENCOUNTER — Other Ambulatory Visit: Payer: Self-pay

## 2021-10-31 DIAGNOSIS — J4540 Moderate persistent asthma, uncomplicated: Secondary | ICD-10-CM

## 2021-10-31 MED ORDER — BUDESONIDE-FORMOTEROL FUMARATE 160-4.5 MCG/ACT IN AERO: 2.0000 | INHALATION_SPRAY | Freq: Two times a day (BID) | RESPIRATORY_TRACT | 0 refills | Status: DC

## 2021-11-22 ENCOUNTER — Encounter (HOSPITAL_COMMUNITY): Payer: Self-pay

## 2021-11-22 ENCOUNTER — Other Ambulatory Visit (HOSPITAL_COMMUNITY): Payer: BC Managed Care – PPO

## 2022-01-16 ENCOUNTER — Other Ambulatory Visit: Payer: Self-pay

## 2022-01-16 DIAGNOSIS — J4540 Moderate persistent asthma, uncomplicated: Secondary | ICD-10-CM

## 2022-01-25 ENCOUNTER — Other Ambulatory Visit: Payer: Self-pay

## 2022-01-25 DIAGNOSIS — J4540 Moderate persistent asthma, uncomplicated: Secondary | ICD-10-CM

## 2022-01-25 MED ORDER — BUDESONIDE-FORMOTEROL FUMARATE 160-4.5 MCG/ACT IN AERO: 2.0000 | INHALATION_SPRAY | Freq: Two times a day (BID) | RESPIRATORY_TRACT | 0 refills | Status: DC

## 2022-02-03 ENCOUNTER — Emergency Department (HOSPITAL_COMMUNITY)
Admission: EM | Admit: 2022-02-03 | Discharge: 2022-02-03 | Disposition: A | Discharge status: Home / Self Care | Payer: BC Managed Care – PPO | Attending: Emergency Medicine | Admitting: Emergency Medicine

## 2022-02-03 ENCOUNTER — Emergency Department (HOSPITAL_COMMUNITY): Payer: BC Managed Care – PPO

## 2022-02-03 ENCOUNTER — Other Ambulatory Visit: Payer: Self-pay

## 2022-02-03 ENCOUNTER — Encounter (HOSPITAL_COMMUNITY): Payer: Self-pay | Admitting: Emergency Medicine

## 2022-02-03 DIAGNOSIS — K449 Diaphragmatic hernia without obstruction or gangrene: Secondary | ICD-10-CM | POA: Diagnosis not present

## 2022-02-03 DIAGNOSIS — K429 Umbilical hernia without obstruction or gangrene: Secondary | ICD-10-CM | POA: Diagnosis not present

## 2022-02-03 DIAGNOSIS — N200 Calculus of kidney: Secondary | ICD-10-CM | POA: Diagnosis not present

## 2022-02-03 DIAGNOSIS — I700 Atherosclerosis of aorta: Secondary | ICD-10-CM | POA: Diagnosis not present

## 2022-02-03 DIAGNOSIS — R109 Unspecified abdominal pain: Secondary | ICD-10-CM | POA: Diagnosis not present

## 2022-02-03 DIAGNOSIS — N132 Hydronephrosis with renal and ureteral calculous obstruction: Secondary | ICD-10-CM | POA: Diagnosis not present

## 2022-02-03 DIAGNOSIS — R1084 Generalized abdominal pain: Secondary | ICD-10-CM | POA: Diagnosis not present

## 2022-02-03 LAB — CBC WITH DIFFERENTIAL/PLATELET
Abs Immature Granulocytes: 0.02 10*3/uL (ref 0.00–0.07)
Basophils Absolute: 0 10*3/uL (ref 0.0–0.1)
Basophils Relative: 0 %
Eosinophils Absolute: 0 10*3/uL (ref 0.0–0.5)
Eosinophils Relative: 0 %
HCT: 46.9 % — ABNORMAL HIGH (ref 36.0–46.0)
Hemoglobin: 14.4 g/dL (ref 12.0–15.0)
Immature Granulocytes: 0 %
Lymphocytes Relative: 6 %
Lymphs Abs: 0.5 10*3/uL — ABNORMAL LOW (ref 0.7–4.0)
MCH: 29.4 pg (ref 26.0–34.0)
MCHC: 30.7 g/dL (ref 30.0–36.0)
MCV: 95.9 fL (ref 80.0–100.0)
Monocytes Absolute: 0.2 10*3/uL (ref 0.1–1.0)
Monocytes Relative: 3 %
Neutro Abs: 7.2 10*3/uL (ref 1.7–7.7)
Neutrophils Relative %: 91 %
Platelets: 204 10*3/uL (ref 150–400)
RBC: 4.89 MIL/uL (ref 3.87–5.11)
RDW: 13.4 % (ref 11.5–15.5)
WBC: 7.9 10*3/uL (ref 4.0–10.5)
nRBC: 0 % (ref 0.0–0.2)

## 2022-02-03 LAB — LIPASE, BLOOD: Lipase: 32 U/L (ref 11–51)

## 2022-02-03 LAB — URINALYSIS, ROUTINE W REFLEX MICROSCOPIC
Bilirubin Urine: NEGATIVE
Glucose, UA: NEGATIVE mg/dL
Hgb urine dipstick: NEGATIVE
Ketones, ur: NEGATIVE mg/dL
Leukocytes,Ua: NEGATIVE
Nitrite: NEGATIVE
Protein, ur: NEGATIVE mg/dL
Specific Gravity, Urine: 1.028 (ref 1.005–1.030)
pH: 5 (ref 5.0–8.0)

## 2022-02-03 LAB — COMPREHENSIVE METABOLIC PANEL
ALT: 18 U/L (ref 0–44)
AST: 22 U/L (ref 15–41)
Albumin: 3.7 g/dL (ref 3.5–5.0)
Alkaline Phosphatase: 59 U/L (ref 38–126)
Anion gap: 16 — ABNORMAL HIGH (ref 5–15)
BUN: 19 mg/dL (ref 8–23)
CO2: 23 mmol/L (ref 22–32)
Calcium: 10.8 mg/dL — ABNORMAL HIGH (ref 8.9–10.3)
Chloride: 104 mmol/L (ref 98–111)
Creatinine, Ser: 0.78 mg/dL (ref 0.44–1.00)
GFR, Estimated: >60 mL/min (ref >60)
Glucose, Bld: 132 mg/dL — ABNORMAL HIGH (ref 70–99)
Potassium: 4.4 mmol/L (ref 3.5–5.1)
Sodium: 143 mmol/L (ref 135–145)
Total Bilirubin: 0.7 mg/dL (ref 0.3–1.2)
Total Protein: 6.4 g/dL — ABNORMAL LOW (ref 6.5–8.1)

## 2022-02-03 MED ORDER — ONDANSETRON 4 MG PO TBDP: 4.0000 mg | ORAL_TABLET | Freq: Three times a day (TID) | ORAL | 0 refills | Status: DC | PRN

## 2022-02-03 MED ORDER — CEPHALEXIN 500 MG PO CAPS: 500.0000 mg | ORAL_CAPSULE | Freq: Two times a day (BID) | ORAL | 0 refills | Status: DC

## 2022-02-03 MED ORDER — OXYCODONE-ACETAMINOPHEN 5-325 MG PO TABS
1.0000 | ORAL_TABLET | Freq: Once | ORAL | Status: AC
  Administered 2022-02-03: 1 via ORAL
  Filled 2022-02-03: qty 1

## 2022-02-03 MED ORDER — ONDANSETRON 4 MG PO TBDP
8.0000 mg | ORAL_TABLET | Freq: Once | ORAL | Status: AC
  Administered 2022-02-03: 8 mg via ORAL
  Filled 2022-02-03: qty 2

## 2022-02-03 MED ORDER — OXYCODONE-ACETAMINOPHEN 5-325 MG PO TABS: 1.0000 | ORAL_TABLET | Freq: Four times a day (QID) | ORAL | 0 refills | Status: DC | PRN

## 2022-02-03 MED ORDER — HYDROCODONE-ACETAMINOPHEN 5-325 MG PO TABS
1.0000 | ORAL_TABLET | Freq: Once | ORAL | Status: AC
  Administered 2022-02-03: 1 via ORAL
  Filled 2022-02-03: qty 1

## 2022-02-03 MED ORDER — ONDANSETRON 4 MG PO TBDP
4.0000 mg | ORAL_TABLET | Freq: Once | ORAL | Status: AC | PRN
  Administered 2022-02-03: 4 mg via ORAL

## 2022-02-03 MED ORDER — ONDANSETRON 4 MG PO TBDP
4.0000 mg | ORAL_TABLET | Freq: Once | ORAL | Status: AC
  Administered 2022-02-03: 4 mg via ORAL
  Filled 2022-02-03: qty 1

## 2022-02-03 NOTE — ED Triage Notes (Signed)
Patient c/o left lower abdominal pain radiating into her back. Patient reports feeling this pain a few weeks ago that went away but woke her from her sleep around 1:30am. Reports emesis and nausea. Reports small BM early this am. Took tylenol around 6am today and went to clinic PTA and given Zofran with no relief.

## 2022-02-03 NOTE — ED Provider Triage Note (Signed)
Emergency Medicine Provider Triage Evaluation Note  Alyssa Phillips , a 66 y.o. female  was evaluated in triage.  Pt complains of left-sided flank pain.  Patient reports symptoms woke him from sleep around 130 this morning.  Reports associated nausea and vomiting.  Denies fever, hematuria, urinary retention, dysuria.  Denies history of kidney stones.  Reports similar symptoms earlier in the week that resolved spontaneously.  Review of Systems  Positive: See abov Negative:   Physical Exam  BP (!) 144/81   Pulse (!) 55   Temp 98.2 F (36.8 C) (Oral)   Resp 13   Ht 5\' 9"  (1.753 m)   Wt 120.2 kg   SpO2 98%   BMI 39.13 kg/m  Gen:   Awake, no distress   Resp:  Normal effort  MSK:   Moves extremities without difficulty  Other:  Left-sided CVA tenderness.  Mild anterior left lower quadrant tenderness.  Medical Decision Making  Medically screening exam initiated at 12:03 PM.  Appropriate orders placed.  Alyssa Phillips was informed that the remainder of the evaluation will be completed by another provider, this initial triage assessment does not replace that evaluation, and the importance of remaining in the ED until their evaluation is complete.     Alyssa Phillips, [place] 02/03/22 1204

## 2022-02-03 NOTE — ED Provider Notes (Signed)
[facility]-EMERGENCY DEPT [facility] Emergency Department Provider Note MRN:  [id number]  Arrival date & time: 02/03/22     Chief Complaint   Abdominal Pain   History of Present Illness   Alyssa Phillips is a 66 y.o. year-old female presents to the ED with chief complaint of left-sided flank pain.  She states that she felt something similar a couple weeks ago, but this episode awakened her from sleep at 1 AM this morning.  She states that she has had some intermittent nausea and vomiting.  Has tried taking Tylenol without relief.  Also tried Zofran without relief.  She states the pain waxes and wanes.  History provided by patient.   Review of Systems  Pertinent positive and negative review of systems noted in HPI.    Physical Exam   Vitals:   02/03/22 2211 02/03/22 2212  BP:  (!) 149/66  Pulse:  60  Resp:  16  Temp: 98.8 F (37.1 C)   SpO2:  97%    CONSTITUTIONAL:  non toxic-appearing, NAD NEURO:  Alert and oriented x 3, CN 3-12 grossly intact EYES:  eyes equal and reactive ENT/NECK:  Supple, no stridor  CARDIO:  normal rate, appears well-perfused  PULM:  No respiratory distress,  GI/GU:  non-distended,  MSK/SPINE:  No gross deformities, no edema, moves all extremities  SKIN:  no rash, atraumatic   *Additional and/or pertinent findings included in MDM below  Diagnostic and Interventional Summary     Labs Reviewed  COMPREHENSIVE METABOLIC PANEL - Abnormal; Notable for the following components:      Result Value   Glucose, Bld 132 (*)    Calcium 10.8 (*)    Total Protein 6.4 (*)    Anion gap 16 (*)    All other components within normal limits  CBC WITH DIFFERENTIAL/PLATELET - Abnormal; Notable for the following components:   HCT 46.9 (*)    Lymphs Abs 0.5 (*)    All other components within normal limits  URINALYSIS, ROUTINE W REFLEX MICROSCOPIC - Abnormal; Notable for the following components:   APPearance HAZY (*)    All other components within normal  limits  URINE CULTURE  LIPASE, BLOOD    CT Renal Stone Study  Final Result      Medications  ondansetron (ZOFRAN-ODT) disintegrating tablet 4 mg (has no administration in time range)  ondansetron (ZOFRAN-ODT) disintegrating tablet 8 mg (8 mg Oral Given 02/03/22 1227)  HYDROcodone-acetaminophen (NORCO/VICODIN) 5-325 MG per tablet 1 tablet (1 tablet Oral Given 02/03/22 1227)  ondansetron (ZOFRAN-ODT) disintegrating tablet 4 mg (4 mg Oral Given 02/03/22 1852)  oxyCODONE-acetaminophen (PERCOCET/ROXICET) 5-325 MG per tablet 1 tablet (1 tablet Oral Given 02/03/22 2211)     Procedures  /  Critical Care Procedures  ED Course and Medical Decision Making  I have reviewed the triage vital signs, the nursing notes, and pertinent available records from the EMR.  Social Determinants Affecting Complexity of Care: Patient has no clinically significant social determinants affecting this chief complaint..   ED Course:    Medical Decision Making Patient here with left-sided flank pain.  Triage began work-up earlier today.  Labs are reassuring.  Patient brought back for redose of pain medicine.  I offered her to continue to wait for a room to see if we can achieve better pain control versus trial outpatient treatment with pain medications.  She elects to be discharged.  She does not appear toxic.  We will have her follow-up with urology.  Problems Addressed: Kidney  stone: acute illness or injury  Amount and/or Complexity of Data Reviewed Labs: ordered.    Details: No evidence of AKI, urinalysis is not consistent with infection Radiology: independent interpretation performed.    Details: Left-sided hydronephrosis is noted with stone present  Risk Prescription drug management.     Consultants: No consultations were needed in caring for this patient.   Treatment and Plan: Emergency department workup does not suggest an emergent condition requiring admission or immediate intervention  beyond  what has been performed at this time. The patient is safe for discharge and has  been instructed to return immediately for worsening symptoms, change in  symptoms or any other concerns    Final Clinical Impressions(s) / ED Diagnoses     ICD-10-CM   1. Kidney stone  N20.0       ED Discharge Orders          Ordered    oxyCODONE-acetaminophen (PERCOCET) 5-325 MG tablet  Every 6 hours PRN,   Status:  Discontinued        02/03/22 2213    ondansetron (ZOFRAN-ODT) 4 MG disintegrating tablet  Every 8 hours PRN        02/03/22 2213    cephALEXin (KEFLEX) 500 MG capsule  2 times daily        02/03/22 2213    oxyCODONE-acetaminophen (PERCOCET) 5-325 MG tablet  Every 6 hours PRN        02/03/22 2213              Discharge Instructions Discussed with and Provided to Patient:   Discharge Instructions      Return for uncontrollable pain, persistent vomiting, fever.  Call the Urology practice listed and make an appointment.      Montine Circle, PA-C 28/31/51 [id number]    Lianne Cure, DO 07/37/10 [phone]

## 2022-02-03 NOTE — Discharge Instructions (Addendum)
Return for uncontrollable pain, persistent vomiting, fever.  Call the Urology practice listed and make an appointment.

## 2022-02-05 LAB — URINE CULTURE: Culture: <10000 — AB

## 2022-02-06 ENCOUNTER — Encounter (HOSPITAL_COMMUNITY): Payer: Self-pay | Admitting: Urology

## 2022-02-06 ENCOUNTER — Inpatient Hospital Stay (HOSPITAL_COMMUNITY): Payer: BC Managed Care – PPO | Admitting: Anesthesiology

## 2022-02-06 ENCOUNTER — Ambulatory Visit (HOSPITAL_COMMUNITY)
Admission: AD | Admit: 2022-02-06 | Discharge: 2022-02-06 | Disposition: A | Discharge status: Home / Self Care | Payer: BC Managed Care – PPO | Source: Ambulatory Visit | Attending: Urology | Admitting: Urology

## 2022-02-06 ENCOUNTER — Inpatient Hospital Stay (HOSPITAL_COMMUNITY): Payer: BC Managed Care – PPO

## 2022-02-06 ENCOUNTER — Encounter (HOSPITAL_COMMUNITY)
Admission: AD | Disposition: A | Discharge status: Home / Self Care | Payer: Self-pay | Source: Ambulatory Visit | Attending: Urology

## 2022-02-06 DIAGNOSIS — N201 Calculus of ureter: Principal | ICD-10-CM | POA: Insufficient documentation

## 2022-02-06 DIAGNOSIS — I252 Old myocardial infarction: Secondary | ICD-10-CM | POA: Insufficient documentation

## 2022-02-06 DIAGNOSIS — I2510 Atherosclerotic heart disease of native coronary artery without angina pectoris: Secondary | ICD-10-CM | POA: Insufficient documentation

## 2022-02-06 DIAGNOSIS — K219 Gastro-esophageal reflux disease without esophagitis: Secondary | ICD-10-CM | POA: Diagnosis not present

## 2022-02-06 DIAGNOSIS — Z6839 Body mass index (BMI) 39.0-39.9, adult: Secondary | ICD-10-CM | POA: Insufficient documentation

## 2022-02-06 DIAGNOSIS — I10 Essential (primary) hypertension: Secondary | ICD-10-CM | POA: Diagnosis not present

## 2022-02-06 DIAGNOSIS — N132 Hydronephrosis with renal and ureteral calculous obstruction: Secondary | ICD-10-CM | POA: Diagnosis not present

## 2022-02-06 DIAGNOSIS — R109 Unspecified abdominal pain: Secondary | ICD-10-CM | POA: Diagnosis not present

## 2022-02-06 HISTORY — PX: CYSTOSCOPY W/ URETERAL STENT PLACEMENT: SHX1429

## 2022-02-06 SURGERY — CYSTOSCOPY, WITH RETROGRADE PYELOGRAM AND URETERAL STENT INSERTION
Anesthesia: General | Laterality: Left

## 2022-02-06 MED ORDER — FENTANYL CITRATE (PF) 100 MCG/2ML IJ SOLN
INTRAMUSCULAR | Status: DC | PRN
  Administered 2022-02-06 (×2): 25 ug via INTRAVENOUS
  Administered 2022-02-06: 50 ug via INTRAVENOUS

## 2022-02-06 MED ORDER — CIPROFLOXACIN IN D5W 400 MG/200ML IV SOLN
400.0000 mg | INTRAVENOUS | Status: AC
  Administered 2022-02-06: 400 mg via INTRAVENOUS
  Filled 2022-02-06: qty 200

## 2022-02-06 MED ORDER — PHENYLEPHRINE HCL-NACL 20-0.9 MG/250ML-% IV SOLN
INTRAVENOUS | Status: DC | PRN
  Administered 2022-02-06: 20 ug/min via INTRAVENOUS

## 2022-02-06 MED ORDER — MIDAZOLAM HCL 2 MG/2ML IJ SOLN
INTRAMUSCULAR | Status: AC
  Filled 2022-02-06: qty 2

## 2022-02-06 MED ORDER — PHENYLEPHRINE HCL (PRESSORS) 10 MG/ML IV SOLN
INTRAVENOUS | Status: AC
  Filled 2022-02-06: qty 1

## 2022-02-06 MED ORDER — OXYCODONE-ACETAMINOPHEN 5-325 MG PO TABS: 1.0000 | ORAL_TABLET | Freq: Four times a day (QID) | ORAL | 0 refills | Status: DC | PRN

## 2022-02-06 MED ORDER — DEXAMETHASONE SODIUM PHOSPHATE 10 MG/ML IJ SOLN
INTRAMUSCULAR | Status: DC | PRN
  Administered 2022-02-06: 8 mg via INTRAVENOUS

## 2022-02-06 MED ORDER — TAMSULOSIN HCL 0.4 MG PO CAPS: 0.4000 mg | ORAL_CAPSULE | Freq: Every day | ORAL | 0 refills | Status: DC

## 2022-02-06 MED ORDER — LIDOCAINE 2% (20 MG/ML) 5 ML SYRINGE
INTRAMUSCULAR | Status: DC | PRN
  Administered 2022-02-06: 60 mg via INTRAVENOUS

## 2022-02-06 MED ORDER — LACTATED RINGERS IV SOLN: INTRAVENOUS | Status: DC

## 2022-02-06 MED ORDER — IOHEXOL 300 MG/ML  SOLN
INTRAMUSCULAR | Status: DC | PRN
  Administered 2022-02-06: 8 mL

## 2022-02-06 MED ORDER — ONDANSETRON HCL 4 MG/2ML IJ SOLN
INTRAMUSCULAR | Status: DC | PRN
  Administered 2022-02-06: 4 mg via INTRAVENOUS

## 2022-02-06 MED ORDER — OXYCODONE HCL 5 MG PO TABS: 5.0000 mg | ORAL_TABLET | Freq: Once | ORAL | Status: DC | PRN

## 2022-02-06 MED ORDER — ACETAMINOPHEN 500 MG PO TABS: 1000.0000 mg | ORAL_TABLET | Freq: Once | ORAL | Status: DC

## 2022-02-06 MED ORDER — PHENAZOPYRIDINE HCL 200 MG PO TABS: 200.0000 mg | ORAL_TABLET | Freq: Three times a day (TID) | ORAL | 1 refills | Status: DC | PRN

## 2022-02-06 MED ORDER — ONDANSETRON HCL 4 MG/2ML IJ SOLN: 4.0000 mg | Freq: Once | INTRAMUSCULAR | Status: DC | PRN

## 2022-02-06 MED ORDER — MIDAZOLAM HCL 5 MG/5ML IJ SOLN
INTRAMUSCULAR | Status: DC | PRN
  Administered 2022-02-06: 1 mg via INTRAVENOUS

## 2022-02-06 MED ORDER — OXYCODONE HCL 5 MG/5ML PO SOLN: 5.0000 mg | Freq: Once | ORAL | Status: DC | PRN

## 2022-02-06 MED ORDER — ACETAMINOPHEN 500 MG PO TABS
1000.0000 mg | ORAL_TABLET | Freq: Once | ORAL | Status: AC
  Administered 2022-02-06: 1000 mg via ORAL
  Filled 2022-02-06: qty 2

## 2022-02-06 MED ORDER — PROPOFOL 10 MG/ML IV BOLUS
INTRAVENOUS | Status: DC | PRN
  Administered 2022-02-06: 160 mg via INTRAVENOUS

## 2022-02-06 MED ORDER — PROPOFOL 10 MG/ML IV BOLUS
INTRAVENOUS | Status: AC
  Filled 2022-02-06: qty 20

## 2022-02-06 MED ORDER — SODIUM CHLORIDE 0.9 % IR SOLN
Status: DC | PRN
  Administered 2022-02-06: 3000 mL via INTRAVESICAL

## 2022-02-06 MED ORDER — FENTANYL CITRATE PF 50 MCG/ML IJ SOSY: 25.0000 ug | PREFILLED_SYRINGE | INTRAMUSCULAR | Status: DC | PRN

## 2022-02-06 MED ORDER — FENTANYL CITRATE (PF) 100 MCG/2ML IJ SOLN
INTRAMUSCULAR | Status: AC
  Filled 2022-02-06: qty 2

## 2022-02-06 SURGICAL SUPPLY — 15 items
BAG URO CATCHER STRL LF (MISCELLANEOUS) ×1 IMPLANT
CATH URETL OPEN 5X70 (CATHETERS) ×1 IMPLANT
CATH URETL OPEN END 6FR 70 (CATHETERS) IMPLANT
CLOTH BEACON ORANGE TIMEOUT ST (SAFETY) ×1 IMPLANT
GLOVE BIO SURGEON STRL SZ7 (GLOVE) ×1 IMPLANT
GLOVE SURG LX STRL 7.5 STRW (GLOVE) ×1 IMPLANT
GOWN STRL REUS W/ TWL LRG LVL3 (GOWN DISPOSABLE) ×1 IMPLANT
GOWN STRL REUS W/ TWL XL LVL3 (GOWN DISPOSABLE) ×1 IMPLANT
GOWN STRL REUS W/TWL LRG LVL3 (GOWN DISPOSABLE) ×1
GOWN STRL REUS W/TWL XL LVL3 (GOWN DISPOSABLE) ×1
GUIDEWIRE ZIPWRE .038 STRAIGHT (WIRE) ×1 IMPLANT
MANIFOLD NEPTUNE II (INSTRUMENTS) ×1 IMPLANT
PACK CYSTO (CUSTOM PROCEDURE TRAY) ×1 IMPLANT
STENT URET 6FRX26 CONTOUR (STENTS) IMPLANT
TUBING CONNECTING 10 (TUBING) ×1 IMPLANT

## 2022-02-06 NOTE — Op Note (Signed)
Operative Note  Preoperative diagnosis:  1.  Left ureteral stone 2. Flank pain  Postoperative diagnosis: 1.  same  Procedure(s): 1.  Cystoscopy 2. left retrograde pyelogram with interpretation 3. left ureteral stent placement 6x26 4. Fluoroscopy <1 hour with intraoperative interpretation  Surgeon: Donald Pore, MD  Assistants:  None  Anesthesia:  General  Complications:  None  EBL:  minimal  Specimens: 1. none  Drains/Catheters: 1.  Left 6Fr x 26cm ureteral stent  Intraoperative findings:   Cystoscopy demonstrated no suspicious lesions, masses, stones or other pathology. Left Retrograde pyelogram demonstrated mild hydronephrosis. Successful left ureteral stent placement with curl in the renal pelvis and bladder respectively.  Indication:  Alyssa Phillips is a 66 y.o. female with the above diagnoses. After reviewing the management options for treatment, she elected to proceed with the above surgical procedure(s). We have discussed the potential benefits and risks of the procedure, side effects of the proposed treatment, the likelihood of the patient achieving the goals of the procedure, and any potential problems that might occur during the procedure or recuperation. Informed consent has been obtained.  Description of procedure: The patient was taken to the operating room and general anesthesia was induced.  The patient was placed in the dorsal lithotomy position, prepped and draped in the usual sterile fashion, and preoperative antibiotics were administered. A preoperative time-out was performed.   Cystourethroscopy was performed.  The patient's urethra was examined and was normal. The bladder was then systematically examined in its entirety. There was no evidence for any bladder tumors, stones, or other mucosal pathology.    Attention then turned to the left ureteral orifice. A 0.038 zip wire was passed through the left orifice and over the wire a 5 Fr open ended catheter  was inserted and passed up to the level of the renal pelvis. Omnipaque contrast was injected through the ureteral catheter and a retrograde pyelogram was performed with findings as dictated above. The wire was then replaced and the open ended catheter was removed.   A 6Fr x 26cm ureteral stent was advanced over the wire. The stent was positioned appropriately under fluoroscopic and cystoscopic guidance.  The wire was then removed with an adequate stent curl noted in the renal pelvis as well as in the bladder.  The bladder was then emptied and the procedure ended.  The patient appeared to tolerate the procedure well and without complications.  The patient was able to be awakened and transferred to the recovery unit in satisfactory condition.   Plan:  will plan for staged left ureteroscopy with laser [facility]

## 2022-02-06 NOTE — Anesthesia Preprocedure Evaluation (Addendum)
Anesthesia Evaluation  Patient identified by MRN, date of birth, ID band Patient awake    Reviewed: Allergy & Precautions, NPO status , Patient's Chart, lab work & pertinent test results, reviewed documented beta blocker date and time   History of Anesthesia Complications Negative for: history of anesthetic complications  Airway Mallampati: II  TM Distance: >3 FB Neck ROM: Full    Dental  (+) Dental Advisory Given   Pulmonary asthma ,    Pulmonary exam normal        Cardiovascular hypertension, Pt. on medications and Pt. on home beta blockers + CAD, + Past MI and + Cardiac Stents  Normal cardiovascular exam   '19 TTE - mild focal basal hypertrophy of the septum. EF 40% to 45%. Akinesis of the apical anterior and apical myocardium; hypokinesis of the mid anterior, mid anteroseptal, apical septal, and apical lateral myocardium.     Neuro/Psych  Vertigo   Neuromuscular disease negative psych ROS   GI/Hepatic Neg liver ROS, GERD  Controlled,  Endo/Other  Morbid obesity  Renal/GU negative Renal ROS     Musculoskeletal  (+) Arthritis ,   Abdominal   Peds  Hematology negative hematology ROS (+)   Anesthesia Other Findings   Reproductive/Obstetrics                            Anesthesia Physical Anesthesia Plan  ASA: 3  Anesthesia Plan: General   Post-op Pain Management:    Induction: Intravenous  PONV Risk Score and Plan: 3 and Treatment may vary due to age or medical condition, Ondansetron and Dexamethasone  Airway Management Planned: LMA  Additional Equipment: None  Intra-op Plan:   Post-operative Plan: Extubation in OR  Informed Consent: I have reviewed the patients History and Physical, chart, labs and discussed the procedure including the risks, benefits and alternatives for the proposed anesthesia with the patient or authorized representative who has indicated his/her  understanding and acceptance.     Dental advisory given  Plan Discussed with: CRNA and Anesthesiologist  Anesthesia Plan Comments:        Anesthesia Quick Evaluation

## 2022-02-06 NOTE — Discharge Instructions (Addendum)

## 2022-02-06 NOTE — H&P (Signed)
H&P  History of Present Illness: Alyssa Phillips is a 66 y.o. year old with a left proximal ureteral stone. She has had pain refractory to PO medications and elected to proceed with stent placement  Past Medical History:  Diagnosis Date   Allergic rhinitis    Arthritis    Osteoarthrits-knees-hx. RTKA   Asthma    environmental agents induced asthma   Carpal tunnel syndrome of left wrist 02/19/2020   Carpal tunnel syndrome of right wrist 02/19/2020   Coronary artery disease    Family history of adverse reaction to anesthesia    daughter very sensitive to anesthesia   GERD (gastroesophageal reflux disease)    Heart murmur    hx. mitral valve proplapse-mostly asymptomatic-->not apprectiaed n echo   Hepatic steatosis 03/05/2008   [place]   Hyperlipidemia    Hypertension    Morbid obesity due to excess calories (HCC)    Pain in joint of left shoulder 06/12/2017   Pain in left foot 04/04/2018   Pain of joint of left ankle and foot 12/18/2019   STEMI (ST elevation myocardial infarction) (HCC) 10/2017   LAD treated with DESx1   UTI (urinary tract infection)    Vitamin D deficiency     Past Surgical History:  Procedure Laterality Date   5th finger surgery Right 1990   unknown injury   CARDIAC CATHETERIZATION     CARPAL TUNNEL RELEASE  08/2020   CHOLECYSTECTOMY     COLONOSCOPY WITH PROPOFOL N/A 06/08/2016   Procedure: COLONOSCOPY WITH PROPOFOL;  Surgeon: Dorena Cookey, MD;  Location: [facility] ENDOSCOPY;  Service: Endoscopy;  Laterality: N/A;   CORONARY STENT INTERVENTION N/A 11/01/2017   Procedure: CORONARY STENT INTERVENTION;  Surgeon: Swaziland, Peter M, MD;  Location: [facility] INVASIVE CV LAB;  Service: Cardiovascular;  Laterality: N/A;  Resolute Onyx 4.5 mm x12 mm   CORONARY/GRAFT ACUTE MI REVASCULARIZATION N/A 11/01/2017   Procedure: Coronary/Graft Acute MI Revascularization;  Surgeon: Swaziland, Peter M, MD;  Location: [facility] INVASIVE CV LAB;  Service: Cardiovascular;  Laterality: N/A;   I & D EXTREMITY Left  06/30/2021   Procedure: IRRIGATION AND DEBRIDEMENT left elbow;  Surgeon: Gomez Cleverly, MD;  Location: [facility] OR;  Service: Orthopedics;  Laterality: Left;   KNEE ARTHROSCOPY  05/02/2012   x2 right/ x1 left   LEFT HEART CATH AND CORONARY ANGIOGRAPHY N/A 11/01/2017   Procedure: LEFT HEART CATH AND CORONARY ANGIOGRAPHY;  Surgeon: Swaziland, Peter M, MD;  Location: [facility] INVASIVE CV LAB;  Service: Cardiovascular;  Laterality: N/A;   REPLACEMENT TOTAL KNEE Right 10/2010   ROOT CANAL     TOTAL KNEE ARTHROPLASTY  05/10/2012   Procedure: TOTAL KNEE ARTHROPLASTY;  Surgeon: Loanne Drilling, MD;  Location: [facility] ORS;  Service: Orthopedics;  Laterality: Left;   TUBAL LIGATION     UMBILICAL HERNIA REPAIR      Home Medications:  Current Meds  Medication Sig   acetaminophen (TYLENOL) 500 MG tablet Take 1,000 mg by mouth every 8 (eight) hours.   albuterol (PROVENTIL) (2.5 MG/3ML) 0.083% nebulizer solution Take 3 mLs (2.5 mg total) by nebulization every 6 (six) hours as needed for wheezing or shortness of breath.   albuterol (VENTOLIN HFA) 108 (90 Base) MCG/ACT inhaler Inhale 1-2 puffs into the lungs every 4 (four) hours as needed for wheezing or shortness of breath.   Ascorbic Acid (VITAMIN C PO) Take 1 tablet by mouth daily.   aspirin EC 81 MG EC tablet Take 1 tablet (81 mg total) by mouth daily.  atorvastatin (LIPITOR) 80 MG tablet Take 1 tablet (80 mg total) by mouth every evening.   budesonide-formoterol (SYMBICORT) 160-4.5 MCG/ACT inhaler Inhale 2 puffs into the lungs 2 (two) times daily. Must keep scheduled appt for further refills   candesartan (ATACAND) 4 MG tablet Take 1 tablet (4 mg total) by mouth daily.   cholecalciferol (VITAMIN D) 1000 units tablet Take 1,000 Units by mouth 2 (two) times daily.   Coenzyme Q10 (CO Q 10 PO) Take 1 capsule by mouth daily.   levalbuterol (XOPENEX) 1.25 MG/3ML nebulizer solution Take 1.25 mg by nebulization every 8 (eight) hours as needed for wheezing or shortness of breath.    metoprolol succinate (TOPROL-XL) 50 MG 24 hr tablet Take 1 tablet (50 mg total) by mouth daily. Take with or immediately following a meal.   montelukast (SINGULAIR) 10 MG tablet Take 1 tablet (10 mg total) by mouth at bedtime.   Multiple Vitamins-Minerals (ZINC PO) Take 1 tablet by mouth at bedtime.   oxyCODONE-acetaminophen (PERCOCET) 5-325 MG tablet Take 1-2 tablets by mouth every 6 (six) hours as needed.    Allergies:  Allergies  Allergen Reactions   Compazine [Prochlorperazine Edisylate] Other (See Comments)    Eyes rolled back, tongue curled up   Advil [Ibuprofen] Hives   Aspirin Other (See Comments)    HAS to be EC aspirin!!    Family History  Problem Relation Age of Onset   Hypertension Mother    Arthritis Mother    Lymphoma Mother    Asthma Mother    Cancer Mother    Hearing loss Mother    Miscarriages / [place] Mother    COPD Father    Alcohol abuse Father    Arthritis Father    Hearing loss Father    Hypertension Father    Miscarriages / [place] Sister    Arthritis Maternal Grandmother    Hearing loss Maternal Grandmother    Breast cancer Maternal Grandmother 14   Arthritis Maternal Grandfather    Diabetes Maternal Grandfather    Hypertension Maternal Grandfather    Stroke Maternal Grandfather    Arthritis Paternal Grandmother    Diabetes Paternal Grandmother    Hearing loss Paternal Grandmother    Hearing loss Paternal Grandfather    Learning disabilities Paternal Grandfather     Social History:  reports that she has never smoked. She has never used smokeless tobacco. She reports current alcohol use. She reports that she does not use drugs.  ROS: A complete review of systems was performed.  All systems are negative except for pertinent findings as noted.  Physical Exam:  Vital signs in last 24 hours: Temp:  [98.5 F (36.9 C)] 98.5 F (36.9 C) (10/23 1629) Pulse Rate:  [66] 66 (10/23 1629) Resp:  [16] 16 (10/23 1629) BP: (110)/(70) 110/70  (10/23 1629) SpO2:  [94 %] 94 % (10/23 1629) Weight:  [326 kg] 122 kg (10/23 1629) Constitutional:  Alert and oriented, No acute distress Cardiovascular: Regular rate and rhythm, No JVD Respiratory: Normal respiratory effort, Lungs clear bilaterally GI: Abdomen is soft, nontender, nondistended, no abdominal masses GU: No CVA tenderness Lymphatic: No lymphadenopathy Neurologic: Grossly intact, no focal deficits Psychiatric: Normal mood and affect Drains/Tubes: none   Laboratory Data:  No results for input(s): "WBC", "HGB", "HCT", "PLT" in the last 72 hours.  No results for input(s): "NA", "K", "CL", "GLUCOSE", "BUN", "CALCIUM", "CREATININE" in the last 72 hours.  Invalid input(s): "CO3"   No results found for this or any previous visit (  from the past 24 hour(s)). Recent Results (from the past 240 hour(s))  Urine Culture     Status: Abnormal   Collection Time: 02/03/22  4:00 PM   Specimen: Urine, Clean Catch  Result Value Ref Range Status   Specimen Description URINE, CLEAN CATCH  Final   Special Requests NONE  Final   Culture (A)  Final    <10,000 COLONIES/mL INSIGNIFICANT GROWTH Performed at [facility], [address], [place], [place] [address]    Report Status 02/05/2022 FINAL  Final    Renal Function: Recent Labs    02/03/22 1200  CREATININE 0.78   Estimated Creatinine Clearance: 96.6 mL/min (by C-G formula based on SCr of 0.78 mg/dL).  Radiologic Imaging: No results found.  Assessment:  66 yo F with left proximal ureteral stone, refractory pain  Plan:  To OR for ureteral stent placement. Procedure reviewed in detail, including risks and patient voiced understanding.  Donald Pore, MD 02/06/2022, 4:54 PM  [facility] Pager: [phone]

## 2022-02-06 NOTE — H&P (View-Only) (Signed)
H&P  History of Present Illness: Alyssa Phillips is a 66 y.o. year old with a left proximal ureteral stone. She has had pain refractory to PO medications and elected to proceed with stent placement  Past Medical History:  Diagnosis Date   Allergic rhinitis    Arthritis    Osteoarthrits-knees-hx. RTKA   Asthma    environmental agents induced asthma   Carpal tunnel syndrome of left wrist 02/19/2020   Carpal tunnel syndrome of right wrist 02/19/2020   Coronary artery disease    Family history of adverse reaction to anesthesia    daughter very sensitive to anesthesia   GERD (gastroesophageal reflux disease)    Heart murmur    hx. mitral valve proplapse-mostly asymptomatic-->not apprectiaed n echo   Hepatic steatosis 03/05/2008   [place]   Hyperlipidemia    Hypertension    Morbid obesity due to excess calories (HCC)    Pain in joint of left shoulder 06/12/2017   Pain in left foot 04/04/2018   Pain of joint of left ankle and foot 12/18/2019   STEMI (ST elevation myocardial infarction) (HCC) 10/2017   LAD treated with DESx1   UTI (urinary tract infection)    Vitamin D deficiency     Past Surgical History:  Procedure Laterality Date   5th finger surgery Right 1990   unknown injury   CARDIAC CATHETERIZATION     CARPAL TUNNEL RELEASE  08/2020   CHOLECYSTECTOMY     COLONOSCOPY WITH PROPOFOL N/A 06/08/2016   Procedure: COLONOSCOPY WITH PROPOFOL;  Surgeon: John Hayes, MD;  Location: [facility] ENDOSCOPY;  Service: Endoscopy;  Laterality: N/A;   CORONARY STENT INTERVENTION N/A 11/01/2017   Procedure: CORONARY STENT INTERVENTION;  Surgeon: Jordan, Peter M, MD;  Location: [facility] INVASIVE CV LAB;  Service: Cardiovascular;  Laterality: N/A;  Resolute Onyx 4.5 mm x12 mm   CORONARY/GRAFT ACUTE MI REVASCULARIZATION N/A 11/01/2017   Procedure: Coronary/Graft Acute MI Revascularization;  Surgeon: Jordan, Peter M, MD;  Location: [facility] INVASIVE CV LAB;  Service: Cardiovascular;  Laterality: N/A;   I & D EXTREMITY Left  06/30/2021   Procedure: IRRIGATION AND DEBRIDEMENT left elbow;  Surgeon: Spears, James, MD;  Location: [facility] OR;  Service: Orthopedics;  Laterality: Left;   KNEE ARTHROSCOPY  05/02/2012   x2 right/ x1 left   LEFT HEART CATH AND CORONARY ANGIOGRAPHY N/A 11/01/2017   Procedure: LEFT HEART CATH AND CORONARY ANGIOGRAPHY;  Surgeon: Jordan, Peter M, MD;  Location: [facility] INVASIVE CV LAB;  Service: Cardiovascular;  Laterality: N/A;   REPLACEMENT TOTAL KNEE Right 10/2010   ROOT CANAL     TOTAL KNEE ARTHROPLASTY  05/10/2012   Procedure: TOTAL KNEE ARTHROPLASTY;  Surgeon: Frank V Aluisio, MD;  Location: [facility] ORS;  Service: Orthopedics;  Laterality: Left;   TUBAL LIGATION     UMBILICAL HERNIA REPAIR      Home Medications:  Current Meds  Medication Sig   acetaminophen (TYLENOL) 500 MG tablet Take 1,000 mg by mouth every 8 (eight) hours.   albuterol (PROVENTIL) (2.5 MG/3ML) 0.083% nebulizer solution Take 3 mLs (2.5 mg total) by nebulization every 6 (six) hours as needed for wheezing or shortness of breath.   albuterol (VENTOLIN HFA) 108 (90 Base) MCG/ACT inhaler Inhale 1-2 puffs into the lungs every 4 (four) hours as needed for wheezing or shortness of breath.   Ascorbic Acid (VITAMIN C PO) Take 1 tablet by mouth daily.   aspirin EC 81 MG EC tablet Take 1 tablet (81 mg total) by mouth daily.     atorvastatin (LIPITOR) 80 MG tablet Take 1 tablet (80 mg total) by mouth every evening.   budesonide-formoterol (SYMBICORT) 160-4.5 MCG/ACT inhaler Inhale 2 puffs into the lungs 2 (two) times daily. Must keep scheduled appt for further refills   candesartan (ATACAND) 4 MG tablet Take 1 tablet (4 mg total) by mouth daily.   cholecalciferol (VITAMIN D) 1000 units tablet Take 1,000 Units by mouth 2 (two) times daily.   Coenzyme Q10 (CO Q 10 PO) Take 1 capsule by mouth daily.   levalbuterol (XOPENEX) 1.25 MG/3ML nebulizer solution Take 1.25 mg by nebulization every 8 (eight) hours as needed for wheezing or shortness of breath.    metoprolol succinate (TOPROL-XL) 50 MG 24 hr tablet Take 1 tablet (50 mg total) by mouth daily. Take with or immediately following a meal.   montelukast (SINGULAIR) 10 MG tablet Take 1 tablet (10 mg total) by mouth at bedtime.   Multiple Vitamins-Minerals (ZINC PO) Take 1 tablet by mouth at bedtime.   oxyCODONE-acetaminophen (PERCOCET) 5-325 MG tablet Take 1-2 tablets by mouth every 6 (six) hours as needed.    Allergies:  Allergies  Allergen Reactions   Compazine [Prochlorperazine Edisylate] Other (See Comments)    Eyes rolled back, tongue curled up   Advil [Ibuprofen] Hives   Aspirin Other (See Comments)    HAS to be EC aspirin!!    Family History  Problem Relation Age of Onset   Hypertension Mother    Arthritis Mother    Lymphoma Mother    Asthma Mother    Cancer Mother    Hearing loss Mother    Miscarriages / Stillbirths Mother    COPD Father    Alcohol abuse Father    Arthritis Father    Hearing loss Father    Hypertension Father    Miscarriages / Stillbirths Sister    Arthritis Maternal Grandmother    Hearing loss Maternal Grandmother    Breast cancer Maternal Grandmother 70   Arthritis Maternal Grandfather    Diabetes Maternal Grandfather    Hypertension Maternal Grandfather    Stroke Maternal Grandfather    Arthritis Paternal Grandmother    Diabetes Paternal Grandmother    Hearing loss Paternal Grandmother    Hearing loss Paternal Grandfather    Learning disabilities Paternal Grandfather     Social History:  reports that she has never smoked. She has never used smokeless tobacco. She reports current alcohol use. She reports that she does not use drugs.  ROS: A complete review of systems was performed.  All systems are negative except for pertinent findings as noted.  Physical Exam:  Vital signs in last 24 hours: Temp:  [98.5 F (36.9 C)] 98.5 F (36.9 C) (10/23 1629) Pulse Rate:  [66] 66 (10/23 1629) Resp:  [16] 16 (10/23 1629) BP: (110)/(70) 110/70  (10/23 1629) SpO2:  [94 %] 94 % (10/23 1629) Weight:  [122 kg] 122 kg (10/23 1629) Constitutional:  Alert and oriented, No acute distress Cardiovascular: Regular rate and rhythm, No JVD Respiratory: Normal respiratory effort, Lungs clear bilaterally GI: Abdomen is soft, nontender, nondistended, no abdominal masses GU: No CVA tenderness Lymphatic: No lymphadenopathy Neurologic: Grossly intact, no focal deficits Psychiatric: Normal mood and affect Drains/Tubes: none   Laboratory Data:  No results for input(s): "WBC", "HGB", "HCT", "PLT" in the last 72 hours.  No results for input(s): "NA", "K", "CL", "GLUCOSE", "BUN", "CALCIUM", "CREATININE" in the last 72 hours.  Invalid input(s): "CO3"   No results found for this or any previous visit (  from the past 24 hour(s)). Recent Results (from the past 240 hour(s))  Urine Culture     Status: Abnormal   Collection Time: 02/03/22  4:00 PM   Specimen: Urine, Clean Catch  Result Value Ref Range Status   Specimen Description URINE, CLEAN CATCH  Final   Special Requests NONE  Final   Culture (A)  Final    <10,000 COLONIES/mL INSIGNIFICANT GROWTH Performed at [facility], [address], [place], [place] [address]    Report Status 02/05/2022 FINAL  Final    Renal Function: Recent Labs    02/03/22 1200  CREATININE 0.78   Estimated Creatinine Clearance: 96.6 mL/min (by C-G formula based on SCr of 0.78 mg/dL).  Radiologic Imaging: No results found.  Assessment:  66 yo F with left proximal ureteral stone, refractory pain  Plan:  To OR for ureteral stent placement. Procedure reviewed in detail, including risks and patient voiced understanding.  Donald Pore, MD 02/06/2022, 4:54 PM  [facility] Pager: [phone]

## 2022-02-06 NOTE — Transfer of Care (Signed)
Immediate Anesthesia Transfer of Care Note  Patient: Alyssa Phillips  Procedure(s) Performed: CYSTOSCOPY WITH RETROGRADE PYELOGRAM/URETERAL STENT PLACEMENT (Left)  Patient Location: PACU  Anesthesia Type:General  Level of Consciousness: drowsy and patient cooperative  Airway & Oxygen Therapy: Patient Spontanous Breathing and Patient connected to face mask oxygen  Post-op Assessment: Report given to RN and Post -op Vital signs reviewed and stable  Post vital signs: Reviewed and stable  Last Vitals:  Vitals Value Taken Time  BP 123/66 02/06/22 1924  Temp    Pulse 73 02/06/22 1928  Resp 12 02/06/22 1928  SpO2 100 % 02/06/22 1928  Vitals shown include unvalidated device data.  Last Pain:  Vitals:   02/06/22 1629  TempSrc: Oral  PainSc: 8       Patients Stated Pain Goal: 2 (95/28/41 3244)  Complications: No notable events documented.

## 2022-02-06 NOTE — Anesthesia Procedure Notes (Signed)
Procedure Name: LMA Insertion Date/Time: 02/06/2022 6:57 PM  Performed by: Montel Clock, CRNAPre-anesthesia Checklist: Patient identified, Emergency Drugs available, Suction available, Patient being monitored and Timeout performed Patient Re-evaluated:Patient Re-evaluated prior to induction Oxygen Delivery Method: Circle system utilized Preoxygenation: Pre-oxygenation with 100% oxygen Induction Type: IV induction LMA: LMA with gastric port inserted LMA Size: 4.0 Number of attempts: 1 Dental Injury: Teeth and Oropharynx as per pre-operative assessment

## 2022-02-07 ENCOUNTER — Encounter (HOSPITAL_COMMUNITY): Payer: Self-pay | Admitting: Urology

## 2022-02-07 NOTE — Anesthesia Postprocedure Evaluation (Signed)
Anesthesia Post Note  Patient: Alyssa Phillips  Procedure(s) Performed: CYSTOSCOPY WITH RETROGRADE PYELOGRAM/URETERAL STENT PLACEMENT (Left)     Patient location during evaluation: PACU Anesthesia Type: General Level of consciousness: awake and alert Pain management: pain level controlled Vital Signs Assessment: post-procedure vital signs reviewed and stable Respiratory status: spontaneous breathing, nonlabored ventilation and respiratory function stable Cardiovascular status: stable and blood pressure returned to baseline Anesthetic complications: no   No notable events documented.  Last Vitals:  Vitals:   02/06/22 2033 02/06/22 2053  BP:  125/60  Pulse: 64 65  Resp:  16  Temp:  36.9 C  SpO2: 95% 97%    Last Pain:  Vitals:   02/06/22 2053  TempSrc:   PainSc: 0-No pain                 Audry Pili

## 2022-02-10 ENCOUNTER — Other Ambulatory Visit: Payer: Self-pay | Admitting: Urology

## 2022-02-15 ENCOUNTER — Ambulatory Visit: Payer: BC Managed Care – PPO | Admitting: Family Medicine

## 2022-02-15 ENCOUNTER — Encounter: Payer: Self-pay | Admitting: Family Medicine

## 2022-02-15 VITALS — BP 114/78 | HR 77 | Temp 98.3°F

## 2022-02-15 DIAGNOSIS — N200 Calculus of kidney: Secondary | ICD-10-CM | POA: Diagnosis not present

## 2022-02-15 DIAGNOSIS — R109 Unspecified abdominal pain: Secondary | ICD-10-CM

## 2022-02-15 DIAGNOSIS — R319 Hematuria, unspecified: Secondary | ICD-10-CM | POA: Diagnosis not present

## 2022-02-15 DIAGNOSIS — N390 Urinary tract infection, site not specified: Secondary | ICD-10-CM

## 2022-02-15 LAB — POCT URINALYSIS DIPSTICK
Bilirubin, UA: NEGATIVE
Glucose, UA: NEGATIVE
Ketones, UA: NEGATIVE
Nitrite, UA: NEGATIVE
Protein, UA: POSITIVE — AB
Spec Grav, UA: 1.01 (ref 1.010–1.025)
Urobilinogen, UA: 0.2 E.U./dL
pH, UA: 8 (ref 5.0–8.0)

## 2022-02-15 MED ORDER — CEFTRIAXONE SODIUM 1 G IJ SOLR
1.0000 g | Freq: Once | INTRAMUSCULAR | Status: AC
  Administered 2022-02-15: 1 g via INTRAMUSCULAR

## 2022-02-15 MED ORDER — ONDANSETRON HCL 4 MG/2ML IJ SOLN
4.0000 mg | INTRAMUSCULAR | Status: AC
  Administered 2022-02-15: 4 mg via INTRAMUSCULAR

## 2022-02-15 MED ORDER — CIPROFLOXACIN HCL 500 MG PO TABS: 500.0000 mg | ORAL_TABLET | Freq: Two times a day (BID) | ORAL | 0 refills | Status: DC

## 2022-02-15 MED ORDER — CIPROFLOXACIN HCL 500 MG PO TABS: 500.0000 mg | ORAL_TABLET | Freq: Two times a day (BID) | ORAL | 0 refills | Status: AC

## 2022-02-15 NOTE — Progress Notes (Signed)
Alyssa Phillips , Mar 13, 1956, 66 y.o., female MRN: [id number] Patient Care Team    Relationship Specialty Notifications Start End  Natalia Leatherwood, DO PCP - General Family Medicine  12/04/17   Dorena Cookey, MD (Inactive) Consulting Physician Gastroenterology  12/04/17   Ollen Gross, MD Consulting Physician Orthopedic Surgery  12/04/17   Sidney Ace, MD Referring Physician Allergy  12/12/19   Swaziland, Peter M, MD Consulting Physician Cardiology  12/12/19   Shamleffer, Konrad Dolores, MD Consulting Physician Endocrinology  12/12/19     Chief Complaint  Patient presents with   Flank Pain    Started this morning and got nauseous around lunch/left side     Subjective: Pt presents for an OV with complaints of left sided flank pain of 1 day duration.  Associated symptoms include chills, nausea. She is tolerating PO, but has a decreased appetite. She denies fever.  Recent cystoscopy 02/06/2022 with ureter stent placement. Schedule .for ureteroscopy/laser 02/22/2022. She is flying to [place].  Pt has tried zofran to ease their symptoms.  CT 02/03/2022: IMPRESSION: Moderate left hydronephrosis due to 8 mm calculus at the left UPJ.    03/14/2021   11:10 AM 12/11/2019    9:07 AM 12/09/2018    8:09 AM 04/08/2018    5:09 PM 01/07/2018    8:58 AM  Depression screen PHQ 2/9  Decreased Interest 0 0 0 0 0  Down, Depressed, Hopeless 0 0 0 0 0  PHQ - 2 Score 0 0 0 0 0  Altered sleeping  0     Tired, decreased energy  0     Change in appetite  0     Feeling bad or failure about yourself   0     Trouble concentrating  0     Moving slowly or fidgety/restless  0     Suicidal thoughts  0     PHQ-9 Score  0       Allergies  Allergen Reactions   Compazine [Prochlorperazine Edisylate] Other (See Comments)    Eyes rolled back, tongue curled up   Advil [Ibuprofen] Hives   Aspirin Other (See Comments)    HAS to be EC aspirin!!   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 RTKA   Asthma    environmental agents induced asthma   Carpal tunnel syndrome of left wrist 02/19/2020   Carpal tunnel syndrome of right wrist 02/19/2020   Coronary artery disease    Family history of adverse reaction to anesthesia    daughter very sensitive to anesthesia   GERD (gastroesophageal reflux disease)    Heart murmur    hx. mitral valve proplapse-mostly asymptomatic-->not apprectiaed n echo   Hepatic steatosis 03/05/2008   [place]   Hyperlipidemia    Hypertension    Morbid obesity due to excess calories (HCC)    Pain in joint of left shoulder 06/12/2017   Pain in left foot 04/04/2018   Pain of joint of left ankle and foot 12/18/2019   STEMI (ST elevation myocardial infarction) (HCC) 10/2017   LAD treated with DESx1   UTI (urinary tract  infection)    Vitamin D deficiency    Past Surgical History:  Procedure Laterality Date   5th finger surgery Right 1990   unknown injury   CARDIAC CATHETERIZATION     CARPAL TUNNEL RELEASE  08/2020   CHOLECYSTECTOMY     COLONOSCOPY WITH PROPOFOL N/A 06/08/2016   Procedure: COLONOSCOPY WITH PROPOFOL;  Surgeon: Dorena Cookey, MD;  Location: [facility] ENDOSCOPY;  Service: Endoscopy;  Laterality: N/A;   CORONARY STENT INTERVENTION N/A 11/01/2017   Procedure: CORONARY STENT INTERVENTION;  Surgeon: Swaziland, Peter M, MD;  Location: [facility] INVASIVE CV LAB;  Service: Cardiovascular;  Laterality: N/A;  Resolute Onyx 4.5 mm x12 mm   CORONARY/GRAFT ACUTE MI REVASCULARIZATION N/A 11/01/2017   Procedure: Coronary/Graft Acute MI Revascularization;  Surgeon: Swaziland, Peter M, MD;  Location: [facility] INVASIVE CV LAB;  Service: Cardiovascular;  Laterality: N/A;   CYSTOSCOPY W/  URETERAL STENT PLACEMENT Left 02/06/2022   Procedure: CYSTOSCOPY WITH RETROGRADE PYELOGRAM/URETERAL STENT PLACEMENT;  Surgeon: Despina Arias, MD;  Location: [facility] ORS;  Service: Urology;  Laterality: Left;   I & D EXTREMITY Left 06/30/2021   Procedure: IRRIGATION AND DEBRIDEMENT left elbow;  Surgeon: Gomez Cleverly, MD;  Location: [facility] OR;  Service: Orthopedics;  Laterality: Left;   KNEE ARTHROSCOPY  05/02/2012   x2 right/ x1 left   LEFT HEART CATH AND CORONARY ANGIOGRAPHY N/A 11/01/2017   Procedure: LEFT HEART CATH AND CORONARY ANGIOGRAPHY;  Surgeon: Swaziland, Peter M, MD;  Location: [facility] INVASIVE CV LAB;  Service: Cardiovascular;  Laterality: N/A;   REPLACEMENT TOTAL KNEE Right 10/2010   ROOT CANAL     TOTAL KNEE ARTHROPLASTY  05/10/2012   Procedure: TOTAL KNEE ARTHROPLASTY;  Surgeon: Loanne Drilling, MD;  Location: [facility] ORS;  Service: Orthopedics;  Laterality: Left;   TUBAL LIGATION     UMBILICAL HERNIA REPAIR     Family History  Problem Relation Age of Onset   Hypertension Mother    Arthritis Mother    Lymphoma Mother    Asthma Mother    Cancer Mother    Hearing loss Mother    Miscarriages / [place] Mother    COPD Father    Alcohol abuse Father    Arthritis Father    Hearing loss Father    Hypertension Father    Miscarriages / [place] Sister    Arthritis Maternal Grandmother    Hearing loss Maternal Grandmother    Breast cancer Maternal Grandmother 45   Arthritis Maternal Grandfather    Diabetes Maternal Grandfather    Hypertension Maternal Grandfather    Stroke Maternal Grandfather    Arthritis Paternal Grandmother    Diabetes Paternal Grandmother    Hearing loss Paternal Grandmother    Hearing loss Paternal Grandfather    Learning disabilities Paternal Grandfather    Allergies as of 02/15/2022       Reactions   Compazine [prochlorperazine Edisylate] Other (See Comments)   Eyes rolled back, tongue curled up   Advil [ibuprofen] Hives   Aspirin Other (See Comments)    HAS to be EC aspirin!!        Medication List        Accurate as of February 15, 2022 11:59 PM. If you have any questions, ask your nurse or doctor.          STOP taking these medications    cephALEXin 500 MG capsule Commonly known as: KEFLEX Stopped by: Felix Pacini, DO       TAKE these medications    acetaminophen 500 MG  tablet Commonly known as: TYLENOL Take 1,000 mg by mouth every 6 (six) hours as needed for moderate pain.   albuterol 108 (90 Base) MCG/ACT inhaler Commonly known as: VENTOLIN HFA Inhale 1-2 puffs into the lungs every 4 (four) hours as needed for wheezing or shortness of breath.   albuterol (2.5 MG/3ML) 0.083% nebulizer solution Commonly known as: PROVENTIL Take 3 mLs (2.5 mg total) by nebulization every 6 (six) hours as needed for wheezing or shortness of breath.   aspirin EC 81 MG tablet Take 1 tablet (81 mg total) by mouth daily.   atorvastatin 80 MG tablet Commonly known as: LIPITOR Take 1 tablet (80 mg total) by mouth every evening.   azelastine 0.05 % ophthalmic solution Commonly known as: OPTIVAR Place 1 drop into both eyes daily as needed (itching).   B COMPLEX 1 PO Take 1 tablet by mouth at bedtime.   budesonide-formoterol 160-4.5 MCG/ACT inhaler Commonly known as: SYMBICORT Inhale 2 puffs into the lungs 2 (two) times daily. Must keep scheduled appt for further refills   candesartan 4 MG tablet Commonly known as: ATACAND Take 1 tablet (4 mg total) by mouth daily.   cholecalciferol 1000 units tablet Commonly known as: VITAMIN D Take 1,000 Units by mouth 2 (two) times daily.   ciprofloxacin 500 MG tablet Commonly known as: Cipro Take 1 tablet (500 mg total) by mouth 2 (two) times daily for 5 days. Started by: Felix Pacini, DO   CO Q 10 PO Take 1 capsule by mouth daily.   diclofenac sodium 1 % Gel Commonly known as: VOLTAREN Apply 2-4 g topically daily as needed (knee pain).   levalbuterol 1.25 MG/3ML nebulizer  solution Commonly known as: XOPENEX Take 1.25 mg by nebulization every 8 (eight) hours as needed for wheezing or shortness of breath.   levocetirizine 5 MG tablet Commonly known as: XYZAL Take 1 tablet (5 mg total) by mouth every evening.   meclizine 25 MG tablet Commonly known as: ANTIVERT Take 25 mg by mouth 3 (three) times daily as needed for dizziness.   metoprolol succinate 50 MG 24 hr tablet Commonly known as: TOPROL-XL Take 1 tablet (50 mg total) by mouth daily. Take with or immediately following a meal.   mometasone 50 MCG/ACT nasal spray Commonly known as: NASONEX Place 2 sprays into the nose every morning. What changed:  when to take this reasons to take this   montelukast 10 MG tablet Commonly known as: SINGULAIR Take 1 tablet (10 mg total) by mouth at bedtime.   multivitamin tablet Take 1 tablet by mouth daily.   Nebulizer Devi 1 Device by Does not apply route 3 (three) times daily as needed.   nitroGLYCERIN 0.4 MG SL tablet Commonly known as: NITROSTAT PLACE 1 TABLET UNDER THE TONGUE EVERY 5 MINUTES AS NEEDED FOR CHEST PAIN.   ondansetron 4 MG disintegrating tablet Commonly known as: ZOFRAN-ODT Take 1 tablet (4 mg total) by mouth every 8 (eight) hours as needed for nausea or vomiting.   oxyCODONE-acetaminophen 5-325 MG tablet Commonly known as: Percocet Take 1-2 tablets by mouth every 6 (six) hours as needed.   oxyCODONE-acetaminophen 5-325 MG tablet Commonly known as: Percocet Take 1 tablet by mouth every 6 (six) hours as needed for severe pain.   phenazopyridine 200 MG tablet Commonly known as: Pyridium Take 1 tablet (200 mg total) by mouth 3 (three) times daily as needed for pain (dysuria).   psyllium 58.6 % powder Commonly known as: METAMUCIL Take 1 packet by mouth daily.  tamsulosin 0.4 MG Caps capsule Commonly known as: FLOMAX Take 1 capsule (0.4 mg total) by mouth daily.   TIGER BALM ARTHRITIS RUB EX Apply 1 application. topically at  bedtime as needed (shoulder pain).   Uro-MP 118 MG Caps Take 1 capsule by mouth in the morning, at noon, in the evening, and at bedtime.   VITAMIN C PO Take 1 tablet by mouth daily.   ZINC PO Take 1 tablet by mouth at bedtime.        All past medical history, surgical history, allergies, family history, immunizations andmedications were updated in the EMR today and reviewed under the history and medication portions of their EMR.     ROS Negative, with the exception of above mentioned in HPI   Objective:  BP 114/78   Pulse 77   Temp 98.3 F (36.8 C)   SpO2 95%  There is no height or weight on file to calculate BMI. Physical Exam Vitals and nursing note reviewed.  Constitutional:      General: She is not in acute distress.    Appearance: Normal appearance. She is obese. She is not ill-appearing, toxic-appearing or diaphoretic.  HENT:     Head: Normocephalic and atraumatic.  Eyes:     Extraocular Movements: Extraocular movements intact.     Conjunctiva/sclera: Conjunctivae normal.     Pupils: Pupils are equal, round, and reactive to light.  Cardiovascular:     Rate and Rhythm: Normal rate and regular rhythm.  Pulmonary:     Effort: Pulmonary effort is normal. No respiratory distress.     Breath sounds: Normal breath sounds. No wheezing, rhonchi or rales.  Abdominal:     Tenderness: There is abdominal tenderness. There is left CVA tenderness and guarding. There is no right CVA tenderness or rebound.  Musculoskeletal:     Right lower leg: No edema.     Left lower leg: No edema.  Neurological:     Mental Status: She is alert and oriented to person, place, and time. Mental status is at baseline.  Psychiatric:        Mood and Affect: Mood normal.        Behavior: Behavior normal.        Thought Content: Thought content normal.        Judgment: Judgment normal.    No results found. No results found. Results for orders placed or performed in visit on 02/15/22 (from  the past 24 hour(s))  Urinalysis w microscopic + reflex cultur     Status: Abnormal   Collection Time: 02/15/22  4:24 PM   Specimen: Urine  Result Value Ref Range   Color, Urine YELLOW YELLOW   APPearance CLOUDY (A) CLEAR   Specific Gravity, Urine 1.011 1.001 - 1.035   pH 6.5 5.0 - 8.0   Glucose, UA NEGATIVE NEGATIVE   Bilirubin Urine NEGATIVE NEGATIVE   Ketones, ur NEGATIVE NEGATIVE   Hgb urine dipstick 2+ (A) NEGATIVE   Protein, ur 1+ (A) NEGATIVE   Nitrites, Initial POSITIVE (A) NEGATIVE   Leukocyte Esterase 3+ (A) NEGATIVE   WBC, UA > OR = 60 (A) 0 - 5 /HPF   RBC / HPF 40-60 (A) 0 - 2 /HPF   Squamous Epithelial / LPF 0-5 < OR = 5 /HPF   Bacteria, UA MANY (A) NONE SEEN /HPF   Hyaline Cast 0-5 (A) NONE SEEN /LPF   Note    REFLEXIVE URINE CULTURE     Status: None   Collection Time:  02/15/22  4:24 PM  Result Value Ref Range   REFLEXIVE URINE CULTURE    POCT urinalysis dipstick     Status: Abnormal   Collection Time: 02/15/22  4:24 PM  Result Value Ref Range   Color, UA dark yellow    Clarity, UA cloudy    Glucose, UA Negative Negative   Bilirubin, UA neg    Ketones, UA neg    Spec Grav, UA 1.010 1.010 - 1.025   Blood, UA 3+    pH, UA 8.0 5.0 - 8.0   Protein, UA Positive (A) Negative   Urobilinogen, UA 0.2 0.2 or 1.0 E.U./dL   Nitrite, UA neg    Leukocytes, UA Large (3+) (A) Negative   Appearance     Odor      Assessment/Plan: Barabara B Ganesan is a 66 y.o. female present for OV for  Flank pain/UTI/kidney stone Vital signs are stable.  Expressed concerns to her flying to [place] in this condition.  Stressed to her that if her symptoms worsen, decreased urine output, increased flank pain, fevers, chills she needs to be seen emergently in [place].  She reports understanding. Rocephin IM provided today.  Start Cipro 500 mg twice daily x5 days this evening. IM Zofran provided for nausea.  She has Zofran prescriptions at home. Hydrate. - POCT urinalysis dipstick -  Urinalysis w microscopic + reflex cultur - cefTRIAXone (ROCEPHIN) injection 1 g - ondansetron (ZOFRAN) injection 4 mg   Reviewed expectations re: course of current medical issues. Discussed self-management of symptoms. Outlined signs and symptoms indicating need for more acute intervention. Patient verbalized understanding and all questions were answered. Patient received an After-Visit Summary.    Orders Placed This Encounter  Procedures   Urine Culture   Urinalysis w microscopic + reflex cultur   REFLEXIVE URINE CULTURE   POCT urinalysis dipstick   Meds ordered this encounter  Medications   DISCONTD: ciprofloxacin (CIPRO) 500 MG tablet    Sig: Take 1 tablet (500 mg total) by mouth 2 (two) times daily for 3 days.    Dispense:  6 tablet    Refill:  0   ciprofloxacin (CIPRO) 500 MG tablet    Sig: Take 1 tablet (500 mg total) by mouth 2 (two) times daily for 5 days.    Dispense:  10 tablet    Refill:  0    Please use 5 day script (not previous sent script)   cefTRIAXone (ROCEPHIN) injection 1 g   ondansetron (ZOFRAN) injection 4 mg   Referral Orders  No referral(s) requested today     Note is dictated utilizing voice recognition software. Although note has been proof read prior to signing, occasional typographical errors still can be missed. If any questions arise, please do not hesitate to call for verification.   electronically signed by:  Felix Pacini, DO  [facility] - OR

## 2022-02-15 NOTE — Patient Instructions (Addendum)
Return if symptoms worsen or fail to improve.        Great to see you today.  I have refilled the medication(s) we provide.   If labs were collected, we will inform you of lab results once received either by echart message or telephone call.   - echart message- for normal results that have been seen by the patient already.   - telephone call: abnormal results or if patient has not viewed results in their echart.   Hydrate! Start cipro tonight before bed, every 12 hours for 5 days.  We will call you with urine culture.   If your pain worsens, PLEASE go to ED for emergent imaging.

## 2022-02-16 ENCOUNTER — Telehealth: Payer: Self-pay

## 2022-02-16 NOTE — Telephone Encounter (Signed)
Pt states she thinks it was something that she took yesterday not the injection that she had in office.   [facility] - Client TELEPHONE ADVICE RECORD [facility] Patient Name: Kimoni MCD Irena Reichmann Gender: Female DOB: [birth year] Age: 66 Y 1 D Return Phone Number: [phone] (Primary) Address: City/ State/ Zip: [place]  [address] Client [place] [facility] - Client Client Site [facility] Provider Raoul Pitch, [place] Type Call Who Is Calling Patient / Member / Family / Caregiver Call Type Triage / Clinical Relationship To Patient Self Return Phone Number [phone] (Primary) Chief Complaint Medication reaction Reason for Call Symptomatic / Request for [place] states she had a injection of Penicillin today and just vomited. Translation No Disp. Time Eilene Ghazi Time) Disposition Final User 02/15/2022 5:17:26 PM Attempt made - message left Conner, RN, Lesa 02/15/2022 5:42:40 PM Attempt made - no message left Conner, RN, Lesa 02/15/2022 6:03:32 PM Clinical Call Yes Windle Guard, RN, Emmaline Kluver Final Disposition 02/15/2022 6:03:32 PM Clinical Call Yes Conner, RN, Lesa Comments User: Starleen Blue, RN Date/Time Eilene Ghazi Time): 02/15/2022 6:03:21 PM Caller is identifying himself as Cordie. He will not give me his last name or DOB and is asking for my name and identification number. He is harassing me. I disconnected from the call

## 2022-02-19 NOTE — Patient Instructions (Signed)
SURGICAL WAITING ROOM VISITATION Patients having surgery or a procedure may have no more than 2 support people in the waiting area - these visitors may rotate in the visitor waiting room.   Children under the age of 66 must have an adult with them who is not the patient. If the patient needs to stay at the hospital during part of their recovery, the visitor guidelines for inpatient rooms apply.  PRE-OP VISITATION  Pre-op nurse will coordinate an appropriate time for 1 support person to accompany the patient in pre-op.  This support person may not rotate.  This visitor will be contacted when the time is appropriate for the visitor to come back in the pre-op area.  Please refer to the [facility] website for the visitor guidelines for Inpatients (after your surgery is over and you are in a regular room).  You are not required to quarantine at this time prior to your surgery. However, you must do this: Hand Hygiene often Do NOT share personal items Notify your provider if you are in close contact with someone who has COVID or you develop fever 100.4 or greater, new onset of sneezing, cough, sore throat, shortness of breath or body aches.  If you test positive for Covid or have been in contact with anyone that has tested positive in the last 10 days please notify you surgeon.    Your procedure is scheduled on:  Wednesday  February 22, 2022  Report to [facility] Main Entrance: [address] [facility] entrance where the [employer] is available.   Report to admitting at:  06:15 AM  +++++Call this number if you have any questions or problems the morning of surgery [phone]  Do not eat or drink anything after Midnight the night prior to your surgery/procedure.   FOLLOW ANY ADDITIONAL PRE OP INSTRUCTIONS YOU RECEIVED FROM YOUR SURGEON'S OFFICE!!!   Oral Hygiene is also important to reduce your risk of infection.        Remember - BRUSH YOUR TEETH THE MORNING OF SURGERY WITH YOUR REGULAR  TOOTHPASTE  Take ONLY these medicines the morning of surgery with A SIP OF WATER: Metoprolol, Tamsulosin, Singulair. You may use your nebulizer and inhaler treatments if needed. (Symbicort, Albuterol, Xopenex)  If You have been diagnosed with Sleep Apnea - Bring CPAP mask and tubing day of surgery. We will provide you with a CPAP machine on the day of your surgery.                   You may not have any metal on your body including hair pins, jewelry, and body piercing  Do not wear make-up, lotions, powders, perfumes or deodorant  Do not wear nail polish including gel and S&S, artificial / acrylic nails, or any other type of covering on natural nails including finger and toenails. If you have artificial nails, gel coating, etc., that needs to be removed by a nail salon, Please have this removed prior to surgery. Not doing so may mean that your surgery could be cancelled or delayed if the Surgeon or anesthesia staff feels like they are unable to monitor you safely.   Do not shave 48 hours prior to surgery to avoid nicks in your skin which may contribute to postoperative infections.   Contacts, Hearing Aids, dentures or bridgework may not be worn into surgery.     Patients discharged on the day of surgery will not be allowed to drive home.  Someone NEEDS to stay with you for  the first 24 hours after anesthesia.  Do not bring your home medications to the hospital. The Pharmacy will dispense medications listed on your medication list to you during your admission in the Hospital.  Special Instructions: Bring a copy of your healthcare power of attorney and living will documents the day of surgery, if you wish to have them scanned into your [facility] Records- EPIC  Please read over the following fact sheets you were given: IF YOU HAVE QUESTIONS ABOUT YOUR PRE-OP INSTRUCTIONS, PLEASE CALL [phone]  ([place])   [place] - Preparing for Surgery Before surgery, you can play an important  role.  Because skin is not sterile, your skin needs to be as free of germs as possible.  You can reduce the number of germs on your skin by washing with CHG (chlorahexidine gluconate) soap before surgery.  CHG is an antiseptic cleaner which kills germs and bonds with the skin to continue killing germs even after washing. Please DO NOT use if you have an allergy to CHG or antibacterial soaps.  If your skin becomes reddened/irritated stop using the CHG and inform your nurse when you arrive at Short Stay. Do not shave (including legs and underarms) for at least 48 hours prior to the first CHG shower.  You may shave your face/neck.  Please follow these instructions carefully:  1.  Shower with CHG Soap the night before surgery and the  morning of surgery.  2.  If you choose to wash your hair, wash your hair first as usual with your normal  shampoo.  3.  After you shampoo, rinse your hair and body thoroughly to remove the shampoo.                             4.  Use CHG as you would any other liquid soap.  You can apply chg directly to the skin and wash.  Gently with a scrungie or clean washcloth.  5.  Apply the CHG Soap to your body ONLY FROM THE NECK DOWN.   Do not use on face/ open                           Wound or open sores. Avoid contact with eyes, ears mouth and genitals (private parts).                       Wash face,  Genitals (private parts) with your normal soap.             6.  Wash thoroughly, paying special attention to the area where your  surgery  will be performed.  7.  Thoroughly rinse your body with warm water from the neck down.  8.  DO NOT shower/wash with your normal soap after using and rinsing off the CHG Soap.            9.  Pat yourself dry with a clean towel.            10.  Wear clean pajamas.            11.  Place clean sheets on your bed the night of your first shower and do not  sleep with pets.  ON THE DAY OF SURGERY : Do not apply any lotions/deodorants the morning of  surgery.  Please wear clean clothes to the hospital/surgery center.    FAILURE TO FOLLOW THESE  INSTRUCTIONS MAY RESULT IN THE CANCELLATION OF YOUR SURGERY  PATIENT SIGNATURE_________________________________  NURSE SIGNATURE__________________________________  ________________________________________________________________________

## 2022-02-19 NOTE — Progress Notes (Signed)
COVID Vaccine received:  _0  No _1  Yes Date of any COVID positive Test in last 90 days:  PCP - Howard Pouch DO  at [facility] Primary care [facility]   Cardiologist - Peter Martinique, MD  Chest x-ray - 1 view 01-19-2020  Epic EKG -  09-30-2021 Epic Stress Test -  ECHO - 11-01-2017 Epic Cardiac Cath - 11-01-2017  LHC w/ DES x1   PCR screen: _2  Ordered & Completed                      _3   No Order but Needs PROFEND                      _4   N/A for this surgery  Surgery Plan:  _5  Ambulatory                            _6  Outpatient in bed                            _7  Admit  Anesthesia:    _8  General  _9  Spinal                           _10   Choice _11   MAC   Pacemaker / ICD device _12  No _13  Yes        Device order form faxed _14  No    _15   Yes      Faxed to:  Spinal Cord Stimulator:_16  No _17  Yes      (Remind patient to bring remote DOS) Other Implants:   History of Sleep Apnea? _18  No _19  Yes   CPAP used?- _20  No _21  Yes    Does the patient monitor blood sugar? _22  No _23  Yes  _24  N/A Does patient have a [employer] or Dexacom? _25  No _26  Yes   Fasting Blood Sugar Ranges-  Checks Blood Sugar _____ times a day  Last dose of GLP1 agonist-  GLP1 instructions:  Last dose of SGLT-2 inhibitors-  SGLT-2 instructions:   Blood Thinner / Instructions: Aspirin Instructions:  ASA 21m  Patient is to be NPO after: Midnight prior  Comments:   Activity level: Patient can / can not climb a flight of stairs without difficulty; _27  No CP  _28  No SOB, but would have ______   Patient can / can not perform ADLs without assistance.   Anesthesia review: S/p STEMI- had LHC w/ DES x1 11-01-2017, MVP checked 11-01-2017  Echo, Fatty Liver, HTN, Asthma  Patient denies shortness of breath, fever, cough and chest pain at PAT appointment.  Patient verbalized understanding and agreement to the Pre-Surgical Instructions that were given to them at this PAT appointment. Patient was also educated of the need to  review these PAT instructions again prior to his/her surgery.I reviewed the appropriate phone numbers to call if they have any and questions or concerns.

## 2022-02-20 ENCOUNTER — Telehealth: Payer: Self-pay | Admitting: Family Medicine

## 2022-02-20 ENCOUNTER — Other Ambulatory Visit: Payer: Self-pay | Admitting: Family Medicine

## 2022-02-20 DIAGNOSIS — J4540 Moderate persistent asthma, uncomplicated: Secondary | ICD-10-CM

## 2022-02-20 LAB — URINALYSIS W MICROSCOPIC + REFLEX CULTURE
Bilirubin Urine: NEGATIVE
Glucose, UA: NEGATIVE
Ketones, ur: NEGATIVE
Nitrites, Initial: POSITIVE — AB
Specific Gravity, Urine: 1.011 (ref 1.001–1.035)
WBC, UA: >=60 /HPF — AB (ref 0–5)
pH: 6.5 (ref 5.0–8.0)

## 2022-02-20 LAB — URINE CULTURE
MICRO NUMBER:: 14134765
SPECIMEN QUALITY:: ADEQUATE

## 2022-02-20 LAB — CULTURE INDICATED

## 2022-02-20 NOTE — Telephone Encounter (Signed)
Please inform patient the urine culture was of mixed genital flora and no specific bacterial overgrowth was identified.  I hope she is feeling better.  Finish the antibiotic prescribed and follow-up with her urologist upon her return.

## 2022-02-20 NOTE — Telephone Encounter (Signed)
Spoke with patient regarding results/recommendations.  

## 2022-02-21 ENCOUNTER — Encounter (HOSPITAL_COMMUNITY)
Admission: RE | Admit: 2022-02-21 | Discharge: 2022-02-21 | Disposition: A | Discharge status: Home / Self Care | Payer: BC Managed Care – PPO | Source: Ambulatory Visit | Attending: Urology | Admitting: Urology

## 2022-02-21 ENCOUNTER — Encounter (HOSPITAL_COMMUNITY): Payer: Self-pay

## 2022-02-21 ENCOUNTER — Other Ambulatory Visit: Payer: Self-pay

## 2022-02-21 VITALS — BP 113/69 | HR 74 | Temp 98.2°F | Resp 20 | Ht 69.0 in | Wt 265.0 lb

## 2022-02-21 DIAGNOSIS — I10 Essential (primary) hypertension: Secondary | ICD-10-CM | POA: Insufficient documentation

## 2022-02-21 DIAGNOSIS — N201 Calculus of ureter: Secondary | ICD-10-CM | POA: Insufficient documentation

## 2022-02-21 DIAGNOSIS — I25119 Atherosclerotic heart disease of native coronary artery with unspecified angina pectoris: Secondary | ICD-10-CM | POA: Diagnosis not present

## 2022-02-21 DIAGNOSIS — M1990 Unspecified osteoarthritis, unspecified site: Secondary | ICD-10-CM | POA: Diagnosis not present

## 2022-02-21 DIAGNOSIS — I2510 Atherosclerotic heart disease of native coronary artery without angina pectoris: Secondary | ICD-10-CM | POA: Insufficient documentation

## 2022-02-21 DIAGNOSIS — Z01812 Encounter for preprocedural laboratory examination: Secondary | ICD-10-CM | POA: Insufficient documentation

## 2022-02-21 DIAGNOSIS — Z79899 Other long term (current) drug therapy: Secondary | ICD-10-CM | POA: Diagnosis not present

## 2022-02-21 DIAGNOSIS — Z955 Presence of coronary angioplasty implant and graft: Secondary | ICD-10-CM | POA: Diagnosis not present

## 2022-02-21 DIAGNOSIS — J45909 Unspecified asthma, uncomplicated: Secondary | ICD-10-CM | POA: Diagnosis not present

## 2022-02-21 DIAGNOSIS — I252 Old myocardial infarction: Secondary | ICD-10-CM | POA: Diagnosis not present

## 2022-02-21 HISTORY — DX: Pneumonia, unspecified organism: J18.9

## 2022-02-21 HISTORY — DX: Personal history of urinary calculi: Z87.442

## 2022-02-21 LAB — COMPREHENSIVE METABOLIC PANEL
ALT: 16 U/L (ref 0–44)
AST: 17 U/L (ref 15–41)
Albumin: 2.9 g/dL — ABNORMAL LOW (ref 3.5–5.0)
Alkaline Phosphatase: 60 U/L (ref 38–126)
Anion gap: 7 (ref 5–15)
BUN: 13 mg/dL (ref 8–23)
CO2: 25 mmol/L (ref 22–32)
Calcium: 10.4 mg/dL — ABNORMAL HIGH (ref 8.9–10.3)
Chloride: 111 mmol/L (ref 98–111)
Creatinine, Ser: 0.74 mg/dL (ref 0.44–1.00)
GFR, Estimated: >60 mL/min (ref >60)
Glucose, Bld: 114 mg/dL — ABNORMAL HIGH (ref 70–99)
Potassium: 4.2 mmol/L (ref 3.5–5.1)
Sodium: 143 mmol/L (ref 135–145)
Total Bilirubin: 0.6 mg/dL (ref 0.3–1.2)
Total Protein: 6.1 g/dL — ABNORMAL LOW (ref 6.5–8.1)

## 2022-02-21 LAB — CBC
HCT: 39.8 % (ref 36.0–46.0)
Hemoglobin: 12.4 g/dL (ref 12.0–15.0)
MCH: 29.7 pg (ref 26.0–34.0)
MCHC: 31.2 g/dL (ref 30.0–36.0)
MCV: 95.4 fL (ref 80.0–100.0)
Platelets: 323 10*3/uL (ref 150–400)
RBC: 4.17 MIL/uL (ref 3.87–5.11)
RDW: 13 % (ref 11.5–15.5)
WBC: 7 10*3/uL (ref 4.0–10.5)
nRBC: 0 % (ref 0.0–0.2)

## 2022-02-21 NOTE — Anesthesia Preprocedure Evaluation (Signed)
Anesthesia Evaluation  Patient identified by MRN, date of birth, ID band  Reviewed: Allergy & Precautions, NPO status , Patient's Chart, lab work & pertinent test results, reviewed documented beta blocker date and time   History of Anesthesia Complications Negative for: history of anesthetic complications  Airway Mallampati: III  TM Distance: >3 FB Neck ROM: Full    Dental no notable dental hx.    Pulmonary neg shortness of breath, asthma , neg sleep apnea, neg recent URI, neg PE   Pulmonary exam normal breath sounds clear to auscultation       Cardiovascular Exercise Tolerance: Good hypertension (candesartan, metoprolol), Pt. on home beta blockers + angina (last used nitroglycerin 3 months ago)  + CAD, + Past MI (STEMI 10/2017) and + Cardiac Stents (DES to LAD)  (-) CABG + Valvular Problems/Murmurs MVP  Rhythm:Regular Rate:Normal  TTE 11/01/2017: Impressions:   - -LV EF mild to moderately reduced with focal wall motion    abnormalities. Hypokinesis of anterior and anteroseptal walls and    apex. Echo contrast used to better evaluate wall motion and    exclude LV thrombus.    -No evidence of mitral valve prolapse or regurgitation.    -Technically difficult study. Right sided structures not well    visualized but appear grossly normal.     Neuro/Psych BPPV    GI/Hepatic ,GERD  ,,Hepatic steatosis   Endo/Other  neg diabetes  hyperparathyroidism  Renal/GU Renal disease (nephrolithiasis)     Musculoskeletal  (+) Arthritis , Osteoarthritis,    Abdominal  (+) + obese  Peds  Hematology negative hematology ROS (+)   Anesthesia Other Findings 66 y.o. year old with a left proximal ureteral stone. She has had pain refractory to PO medications and elected to proceed with stent placement  Reproductive/Obstetrics                             Anesthesia Physical Anesthesia Plan  ASA: 3  Anesthesia  Plan: General   Post-op Pain Management:    Induction: Intravenous  PONV Risk Score and Plan: 3 and Treatment may vary due to age or medical condition, Dexamethasone, Ondansetron and Midazolam  Airway Management Planned: LMA  Additional Equipment:   Intra-op Plan:   Post-operative Plan:   Informed Consent: I have reviewed the patients History and Physical, chart, labs and discussed the procedure including the risks, benefits and alternatives for the proposed anesthesia with the patient or authorized representative who has indicated his/her understanding and acceptance.       Plan Discussed with: Anesthesiologist and CRNA  Anesthesia Plan Comments:        Anesthesia Quick Evaluation

## 2022-02-21 NOTE — Progress Notes (Signed)
Anesthesia Chart Review   Case: [id number] Date/Time: 02/22/22 0815   Procedure: LEFT URETEROSCOPY/HOLMIUM LASER/STENT EXCHANGE (Left) - 60 MINUTES NEEDED FOR CASE   Anesthesia type: General   Pre-op diagnosis: LEFT URETERAL STONE   Location: [place] [facility]   Surgeons: Vira Agar, MD       DISCUSSION:66 y.o. never smoker with h/o HTN, CAD (DES 2019), left ureteral stone scheduled for above procedure 02/22/2022 with Dr. Terrilee Files.   Pt last seen by cardiology 09/30/2021. Stable at this visit with one year follow up recommended.   Anticipate pt can proceed with planned procedure barring acute status change.   VS: BP 113/69 Comment: right arm sitting  Pulse 74   Temp 36.8 C   Resp 20   Ht 5\' 9"  (1.753 m)   Wt 120.2 kg   SpO2 98%   BMI 39.13 kg/m   PROVIDERS: Howard Pouch A, DO is PCP last seen 02/15/2022  Cardiologist - Peter Martinique, MD  LABS: Labs reviewed: Acceptable for surgery. (all labs ordered are listed, but only abnormal results are displayed)  Labs Reviewed  COMPREHENSIVE METABOLIC PANEL - Abnormal; Notable for the following components:      Result Value   Glucose, Bld 114 (*)    Calcium 10.4 (*)    Total Protein 6.1 (*)    Albumin 2.9 (*)    All other components within normal limits  CBC     IMAGES:   EKG:   CV: Echo 11/01/2017 Study Conclusions   - Left ventricle: The cavity size was normal. There was mild focal    basal hypertrophy of the septum. Systolic function was mildly to    moderately reduced. The estimated ejection fraction was in the    range of 40% to 45%. Wall motion abnormalities noted below. The    study is indeterminate for the evaluation of LV diastolic    function. Acoustic contrast opacification revealed no evidence    ofthrombus.  - Regional wall motion abnormality: Akinesis of the apical anterior    and apical myocardium; hypokinesis of the mid anterior, mid    anteroseptal, apical septal, and apical  lateral myocardium.  - Aortic valve: There was no regurgitation.  - Mitral valve: Structurally normal valve. No echocardiographic    evidence for prolapse. Transvalvular velocity was within the    normal range. There was no evidence for stenosis. There was no    regurgitation.  - Tricuspid valve: There was no significant regurgitation.  - Pulmonic valve: There was no significant regurgitation.   Past Medical History:  Diagnosis Date   Allergic rhinitis    Arthritis    Osteoarthrits-knees-hx. RTKA   Asthma    environmental agents induced asthma   Carpal tunnel syndrome of left wrist 02/19/2020   Carpal tunnel syndrome of right wrist 02/19/2020   Coronary artery disease    Family history of adverse reaction to anesthesia    daughter very sensitive to anesthesia   GERD (gastroesophageal reflux disease)    Heart murmur    hx. mitral valve proplapse-mostly asymptomatic-->not apprectiaed n echo   Hepatic steatosis 03/05/2008   [place]   History of kidney stones    Hyperlipidemia    Hypertension    Morbid obesity due to excess calories (HCC)    Pain in joint of left shoulder 06/12/2017   Pain in left foot 04/04/2018   Pain of joint of left ankle and foot 12/18/2019   Pneumonia    STEMI (ST  elevation myocardial infarction) (HCC) 10/2017   LAD treated with DESx1   UTI (urinary tract infection)    Vitamin D deficiency     Past Surgical History:  Procedure Laterality Date   5th finger surgery Right 1990   unknown injury   CARDIAC CATHETERIZATION     CARPAL TUNNEL RELEASE  08/2020   CHOLECYSTECTOMY     COLONOSCOPY WITH PROPOFOL N/A 06/08/2016   Procedure: COLONOSCOPY WITH PROPOFOL;  Surgeon: Dorena Cookey, MD;  Location: [facility] ENDOSCOPY;  Service: Endoscopy;  Laterality: N/A;   CORONARY STENT INTERVENTION N/A 11/01/2017   Procedure: CORONARY STENT INTERVENTION;  Surgeon: Swaziland, Peter M, MD;  Location: [facility] INVASIVE CV LAB;  Service: Cardiovascular;  Laterality: N/A;  Resolute Onyx 4.5 mm  x12 mm   CORONARY/GRAFT ACUTE MI REVASCULARIZATION N/A 11/01/2017   Procedure: Coronary/Graft Acute MI Revascularization;  Surgeon: [place], Peter M, MD;  Location: [facility] INVASIVE CV LAB;  Service: Cardiovascular;  Laterality: N/A;   CYSTOSCOPY W/ URETERAL STENT PLACEMENT Left 02/06/2022   Procedure: CYSTOSCOPY WITH RETROGRADE PYELOGRAM/URETERAL STENT PLACEMENT;  Surgeon: Despina Arias, MD;  Location: [facility] ORS;  Service: Urology;  Laterality: Left;   I & D EXTREMITY Left 06/30/2021   Procedure: IRRIGATION AND DEBRIDEMENT left elbow;  Surgeon: Gomez Cleverly, MD;  Location: [facility] OR;  Service: Orthopedics;  Laterality: Left;   KNEE ARTHROSCOPY  05/02/2012   x2 right/ x1 left   LEFT HEART CATH AND CORONARY ANGIOGRAPHY N/A 11/01/2017   Procedure: LEFT HEART CATH AND CORONARY ANGIOGRAPHY;  Surgeon: [place]azil[place], Peter M, MD;  Location: [facility] INVASIVE CV LAB;  Service: Cardiovascular;  Laterality: N/A;   REPLACEMENT TOTAL KNEE Right 10/2010   ROOT CANAL     TOTAL KNEE ARTHROPLASTY  05/10/2012   Procedure: TOTAL KNEE ARTHROPLASTY;  Surgeon: Loanne Drilling, MD;  Location: [facility] ORS;  Service: Orthopedics;  Laterality: Left;   TUBAL LIGATION     UMBILICAL HERNIA REPAIR      MEDICATIONS:  acetaminophen (TYLENOL) 500 MG tablet   albuterol (PROVENTIL) (2.5 MG/3ML) 0.083% nebulizer solution   albuterol (VENTOLIN HFA) 108 (90 Base) MCG/ACT inhaler   Ascorbic Acid (VITAMIN C PO)   aspirin EC 81 MG EC tablet   atorvastatin (LIPITOR) 80 MG tablet   azelastine (OPTIVAR) 0.05 % ophthalmic solution   B Complex Vitamins (B COMPLEX 1 PO)   budesonide-formoterol (SYMBICORT) 160-4.5 MCG/ACT inhaler   candesartan (ATACAND) 4 MG tablet   cholecalciferol (VITAMIN D) 1000 units tablet   Coenzyme Q10 (CO Q 10 PO)   diclofenac sodium (VOLTAREN) 1 % GEL   levalbuterol (XOPENEX) 1.25 MG/3ML nebulizer solution   levocetirizine (XYZAL) 5 MG tablet   meclizine (ANTIVERT) 25 MG tablet   Menthol-Camphor (TIGER BALM ARTHRITIS RUB EX)    Meth-Hyo-M Bl-Na Phos-Ph Sal (URO-MP) 118 MG CAPS   metoprolol succinate (TOPROL-XL) 50 MG 24 hr tablet   mometasone (NASONEX) 50 MCG/ACT nasal spray   montelukast (SINGULAIR) 10 MG tablet   Multiple Vitamin (MULTIVITAMIN) tablet   Multiple Vitamins-Minerals (ZINC PO)   nitroGLYCERIN (NITROSTAT) 0.4 MG SL tablet   ondansetron (ZOFRAN-ODT) 4 MG disintegrating tablet   oxyCODONE-acetaminophen (PERCOCET) 5-325 MG tablet   oxyCODONE-acetaminophen (PERCOCET) 5-325 MG tablet   phenazopyridine (PYRIDIUM) 200 MG tablet   psyllium (METAMUCIL) 58.6 % powder   Respiratory Therapy Supplies (NEBULIZER) DEVI   tamsulosin (FLOMAX) 0.4 MG CAPS capsule   No current facility-administered medications for this encounter.    Jodell Cipro Ward, PA-C [facility] [phone]

## 2022-02-22 ENCOUNTER — Encounter (HOSPITAL_COMMUNITY)
Admission: RE | Disposition: A | Discharge status: Home / Self Care | Payer: Self-pay | Source: Home / Self Care | Attending: Urology

## 2022-02-22 ENCOUNTER — Ambulatory Visit (HOSPITAL_COMMUNITY): Payer: BC Managed Care – PPO | Admitting: Anesthesiology

## 2022-02-22 ENCOUNTER — Encounter (HOSPITAL_COMMUNITY): Payer: Self-pay | Admitting: Urology

## 2022-02-22 ENCOUNTER — Ambulatory Visit (HOSPITAL_COMMUNITY): Payer: BC Managed Care – PPO | Admitting: Physician Assistant

## 2022-02-22 ENCOUNTER — Ambulatory Visit (HOSPITAL_COMMUNITY): Payer: BC Managed Care – PPO

## 2022-02-22 ENCOUNTER — Ambulatory Visit (HOSPITAL_COMMUNITY)
Admission: RE | Admit: 2022-02-22 | Discharge: 2022-02-22 | Disposition: A | Discharge status: Home / Self Care | Payer: BC Managed Care – PPO | Attending: Urology | Admitting: Urology

## 2022-02-22 DIAGNOSIS — M1990 Unspecified osteoarthritis, unspecified site: Secondary | ICD-10-CM | POA: Insufficient documentation

## 2022-02-22 DIAGNOSIS — J45909 Unspecified asthma, uncomplicated: Secondary | ICD-10-CM | POA: Insufficient documentation

## 2022-02-22 DIAGNOSIS — I10 Essential (primary) hypertension: Secondary | ICD-10-CM | POA: Insufficient documentation

## 2022-02-22 DIAGNOSIS — I252 Old myocardial infarction: Secondary | ICD-10-CM | POA: Diagnosis not present

## 2022-02-22 DIAGNOSIS — Z79899 Other long term (current) drug therapy: Secondary | ICD-10-CM | POA: Diagnosis not present

## 2022-02-22 DIAGNOSIS — N201 Calculus of ureter: Secondary | ICD-10-CM | POA: Insufficient documentation

## 2022-02-22 DIAGNOSIS — I25119 Atherosclerotic heart disease of native coronary artery with unspecified angina pectoris: Secondary | ICD-10-CM | POA: Insufficient documentation

## 2022-02-22 DIAGNOSIS — Z955 Presence of coronary angioplasty implant and graft: Secondary | ICD-10-CM | POA: Diagnosis not present

## 2022-02-22 HISTORY — PX: CYSTOSCOPY/URETEROSCOPY/HOLMIUM LASER/STENT PLACEMENT: SHX6546

## 2022-02-22 SURGERY — CYSTOSCOPY/URETEROSCOPY/HOLMIUM LASER/STENT PLACEMENT
Anesthesia: General | Laterality: Left

## 2022-02-22 MED ORDER — CHLORHEXIDINE GLUCONATE 0.12 % MT SOLN
15.0000 mL | Freq: Once | OROMUCOSAL | Status: AC
  Administered 2022-02-22: 15 mL via OROMUCOSAL

## 2022-02-22 MED ORDER — LIDOCAINE HCL (CARDIAC) PF 100 MG/5ML IV SOSY
PREFILLED_SYRINGE | INTRAVENOUS | Status: DC | PRN
  Administered 2022-02-22: 100 mg via INTRATRACHEAL

## 2022-02-22 MED ORDER — FENTANYL CITRATE PF 50 MCG/ML IJ SOSY: 25.0000 ug | PREFILLED_SYRINGE | INTRAMUSCULAR | Status: DC | PRN

## 2022-02-22 MED ORDER — DEXAMETHASONE SODIUM PHOSPHATE 10 MG/ML IJ SOLN
INTRAMUSCULAR | Status: AC
  Filled 2022-02-22: qty 1

## 2022-02-22 MED ORDER — ONDANSETRON HCL 4 MG/2ML IJ SOLN: 4.0000 mg | Freq: Once | INTRAMUSCULAR | Status: DC | PRN

## 2022-02-22 MED ORDER — DEXAMETHASONE SODIUM PHOSPHATE 10 MG/ML IJ SOLN
INTRAMUSCULAR | Status: DC | PRN
  Administered 2022-02-22: 10 mg via INTRAVENOUS

## 2022-02-22 MED ORDER — ORAL CARE MOUTH RINSE: 15.0000 mL | Freq: Once | OROMUCOSAL | Status: AC

## 2022-02-22 MED ORDER — LACTATED RINGERS IV SOLN: INTRAVENOUS | Status: DC

## 2022-02-22 MED ORDER — FENTANYL CITRATE (PF) 100 MCG/2ML IJ SOLN
INTRAMUSCULAR | Status: DC | PRN
  Administered 2022-02-22 (×4): 50 ug via INTRAVENOUS

## 2022-02-22 MED ORDER — CIPROFLOXACIN IN D5W 400 MG/200ML IV SOLN
400.0000 mg | INTRAVENOUS | Status: AC
  Administered 2022-02-22: 400 mg via INTRAVENOUS
  Filled 2022-02-22: qty 200

## 2022-02-22 MED ORDER — FENTANYL CITRATE (PF) 100 MCG/2ML IJ SOLN
INTRAMUSCULAR | Status: AC
  Filled 2022-02-22: qty 2

## 2022-02-22 MED ORDER — CEPHALEXIN 500 MG PO CAPS: 500.0000 mg | ORAL_CAPSULE | Freq: Two times a day (BID) | ORAL | 0 refills | Status: DC

## 2022-02-22 MED ORDER — ONDANSETRON HCL 4 MG/2ML IJ SOLN
INTRAMUSCULAR | Status: AC
  Filled 2022-02-22: qty 2

## 2022-02-22 MED ORDER — OXYCODONE-ACETAMINOPHEN 5-325 MG PO TABS: 1.0000 | ORAL_TABLET | Freq: Four times a day (QID) | ORAL | 0 refills | Status: AC | PRN

## 2022-02-22 MED ORDER — LIDOCAINE HCL (PF) 2 % IJ SOLN
INTRAMUSCULAR | Status: AC
  Filled 2022-02-22: qty 5

## 2022-02-22 MED ORDER — IOHEXOL 300 MG/ML  SOLN
INTRAMUSCULAR | Status: DC | PRN
  Administered 2022-02-22: 13 mL

## 2022-02-22 MED ORDER — PROPOFOL 10 MG/ML IV BOLUS
INTRAVENOUS | Status: DC | PRN
  Administered 2022-02-22: 160 mg via INTRAVENOUS

## 2022-02-22 MED ORDER — SODIUM CHLORIDE 0.9 % IR SOLN
Status: DC | PRN
  Administered 2022-02-22: 6000 mL via INTRAVESICAL

## 2022-02-22 MED ORDER — ONDANSETRON HCL 4 MG/2ML IJ SOLN
INTRAMUSCULAR | Status: DC | PRN
  Administered 2022-02-22: 4 mg via INTRAVENOUS

## 2022-02-22 SURGICAL SUPPLY — 26 items
BAG COUNTER SPONGE SURGICOUNT (BAG) IMPLANT
BAG SPNG CNTER NS LX DISP (BAG)
BAG URO CATCHER STRL LF (MISCELLANEOUS) ×1 IMPLANT
BASKET ZERO TIP NITINOL 2.4FR (BASKET) IMPLANT
BSKT STON RTRVL ZERO TP 2.4FR (BASKET)
CATH URETERAL DUAL LUMEN 10F (MISCELLANEOUS) ×1 IMPLANT
CATH URETL OPEN END 6FR 70 (CATHETERS) IMPLANT
CLOTH BEACON ORANGE TIMEOUT ST (SAFETY) ×1 IMPLANT
GLOVE SS BIOGEL STRL SZ 7 (GLOVE) ×1 IMPLANT
GLOVE SURG LX STRL 7.5 STRW (GLOVE) ×1 IMPLANT
GOWN STRL REUS W/ TWL XL LVL3 (GOWN DISPOSABLE) ×1 IMPLANT
GOWN STRL REUS W/TWL XL LVL3 (GOWN DISPOSABLE) ×1
GUIDEWIRE STR DUAL SENSOR (WIRE) ×1 IMPLANT
GUIDEWIRE ZIPWRE .038 STRAIGHT (WIRE) ×1 IMPLANT
IV NS 1000ML (IV SOLUTION) ×1
IV NS 1000ML BAXH (IV SOLUTION) ×1 IMPLANT
KIT TURNOVER KIT A (KITS) IMPLANT
LASER FIB FLEXIVA PULSE ID 365 (Laser) IMPLANT
MANIFOLD NEPTUNE II (INSTRUMENTS) ×1 IMPLANT
PACK CYSTO (CUSTOM PROCEDURE TRAY) ×1 IMPLANT
SHEATH NAVIGATOR HD 11/13X36 (SHEATH) IMPLANT
STENT URET 6FRX26 CONTOUR (STENTS) IMPLANT
TRACTIP FLEXIVA PULS ID 200XHI (Laser) IMPLANT
TRACTIP FLEXIVA PULSE ID 200 (Laser) IMPLANT
TUBING CONNECTING 10 (TUBING) ×1 IMPLANT
TUBING UROLOGY SET (TUBING) ×1 IMPLANT

## 2022-02-22 NOTE — Op Note (Signed)
Operative Note  Preoperative diagnosis:  1.  Left proximal ureteral stone s/p stent placement  Postoperative diagnosis: same  Procedure(s): 1.  Left ureteroscopy with laser lithotripsy and basket extraction of stones 2. Cystoscopy  3. Left retrograde pyelogram 4. Left ureteral stent placement (6x26) 5. Fluoroscopy with intraoperative interpretation  Surgeon: Irine Seal, MD  Assistants:  None  Anesthesia:  General  Complications:  None  EBL:  Minimal  Specimens: None (stone completely dusted)  Drains/Catheters: 1.  Left 6Fr x 26cm ureteral stent with tether string  Intraoperative findings:   Cystoscopy demonstrated unremarkable bladder Left Ureteroscopy demonstrated a stone in the collecting system Successful stent placement.  Indication:  Alyssa Phillips is a 66 y.o. female with the above diagnoses. She underwent a stent placement in an urgent fashion several weeks ago and presents today for staged ureteroscopy  Description of procedure: After informed consent was obtained from the patient, the patient was identified and taken to the operating room and placed in the supine position.  General anesthesia was administered as well as perioperative IV antibiotics.  At the beginning of the case, a time-out was performed to properly identify the patient, the surgery to be performed, and the surgical site.  Sequential compression devices were applied to the lower extremities at the beginning of the case for DVT prophylaxis.  The patient was then placed in the dorsal lithotomy supine position, prepped and draped in sterile fashion.  Preliminary scout fluoroscopy revealed that there was a calcification area at the left collecting system, which corresponds to the stone found on the preoperative CT scan. We then passed the 21-French rigid cystoscope through the urethra and into the bladder under vision without any difficulty , noting a normal urethra without strictures.  A systematic  evaluation of the bladder revealed no evidence of any suspicious bladder lesions.  Ureteral orifices were in normal position.    The distal aspect of the ureteral stent was seen protruding from the left ureteral orifice.  We then used the alligator-tooth forceps and grasped the distal end of the ureteral stent and brought it out the urethral meatus while watching the proximal coil straighten out nicely on fluoroscopy. Through the ureteral stent, we then passed a 0.038 glide wire up to the level of the renal pelvis.  The ureteral stent was then removed, leaving the glide wire up the left ureter.  The cystoscope was withdrawn, and a dual lumen catheter was inserted over the glide wire into the distal ureter. A gentle retrograde pyelogram was performed, revealing a normal caliber ureter without any filling defects. There was no hydronephrosis of the collecting system. There was a filling defect in the collecting system corresponding to the stone. A 0.038 sensor wire was then passed up to the level of the renal pelvis and secured to the drape as a safety wire. The dual lumen was removed.  An 11/13Fr ureteral access sheath was carefully advanced up the ureter to the level of the UPJ over this wire under fluoroscopic guidance. The flexible ureteroscope was advanced into the collecting system via the access sheath. The collecting system was inspected. The calculus was identified at the lower pole. Using the 242 micron holmium laser fiber, the stone was easily completely dusted. With the ureteroscope in the kidney, a gentle pyelogram was performed to delineate the calyceal system and we evaluated the calyces systematically. We encountered a no further stone fregments. The rest of the stone fragments were very tiny and these were  irrigated away gently.  The calyces were re-inspected once again and there were no significant stone fragment residual.   We then withdrew the ureteroscope back down the ureter along with the  access sheath, noting no evidence of any stones along the course of the ureter.  Prior to removing the ureteroscope, we did pass the Glidewire back up to the ureter to the renal pelvis.  Once the ureteroscope was removed, we then used the Glidewire under fluoroscopic guidance and passed up a 6-French x 26 cm double-pigtail ureteral stent up the ureter, making sure that the proximal and distal ends coiled within the kidney and bladder respectively.  Note that we left a tether string attached to the distal end of the ureteral stent and it exited the urethral meatus and was secured to the mons with a tegaderm adhesive.  The cystoscope was then advanced back into the bladder under vision.  We were able to see the distal stent coiling nicely within the bladder.  The bladder was then emptied with irrigation solution.  The cystoscope was then removed.    The patient tolerated the procedure well and there was no complication. Patient was awoken from anesthesia and taken to the recovery room in stable condition. I was present and scrubbed for the entirety of the case.  Plan:  Patient will be discharged home and can remove her stent in 2 days at home.   Neva Seat MD [facility]  Pager: [phone]

## 2022-02-22 NOTE — Anesthesia Procedure Notes (Signed)
Procedure Name: LMA Insertion Date/Time: 02/22/2022 8:58 AM  Performed by: [place], Clydene Pugh, CRNAPre-anesthesia Checklist: Patient identified, Emergency Drugs available, Suction available and Patient being monitored Patient Re-evaluated:Patient Re-evaluated prior to induction Oxygen Delivery Method: Circle system utilized Preoxygenation: Pre-oxygenation with 100% oxygen Induction Type: IV induction Ventilation: Mask ventilation without difficulty LMA: LMA inserted LMA Size: 4.0 Number of attempts: 1 Airway Equipment and Method: Bite block Placement Confirmation: positive ETCO2 Tube secured with: Tape Dental Injury: Teeth and Oropharynx as per pre-operative assessment

## 2022-02-22 NOTE — Anesthesia Postprocedure Evaluation (Signed)
Anesthesia Post Note  Patient: Alyssa Phillips  Procedure(s) Performed: LEFT URETEROSCOPY/HOLMIUM LASER/STENT EXCHANGE (Left)     Patient location during evaluation: PACU Anesthesia Type: General Level of consciousness: awake Pain management: pain level controlled Vital Signs Assessment: post-procedure vital signs reviewed and stable Respiratory status: spontaneous breathing, nonlabored ventilation and respiratory function stable Cardiovascular status: blood pressure returned to baseline and stable Postop Assessment: no apparent nausea or vomiting Anesthetic complications: no   No notable events documented.  Last Vitals:  Vitals:   02/22/22 1030 02/22/22 1041  BP: 134/76 (!) 164/96  Pulse: (!) 59 64  Resp: 19   Temp:    SpO2: 96% 94%    Last Pain:  Vitals:   02/22/22 1041  PainSc: 0-No pain                 Linton Rump

## 2022-02-22 NOTE — Transfer of Care (Signed)
Immediate Anesthesia Transfer of Care Note  Patient: Alyssa Phillips  Procedure(s) Performed: LEFT URETEROSCOPY/HOLMIUM LASER/STENT EXCHANGE (Left)  Patient Location: PACU  Anesthesia Type:General  Level of Consciousness: awake, alert , and oriented  Airway & Oxygen Therapy: Patient Spontanous Breathing and Patient connected to face mask oxygen  Post-op Assessment: Report given to RN and Post -op Vital signs reviewed and stable  Post vital signs: Reviewed and stable  Last Vitals:  Vitals Value Taken Time  BP    Temp    Pulse    Resp    SpO2      Last Pain:  Vitals:   02/22/22 0704  PainSc: 0-No pain      Patients Stated Pain Goal: 3 (02/22/22 0704)  Complications: No notable events documented.

## 2022-02-22 NOTE — Interval H&P Note (Signed)
History and Physical Interval Note:  02/22/2022 7:20 AM  Alyssa Phillips  has presented today for surgery, with the diagnosis of LEFT URETERAL STONE.  The various methods of treatment have been discussed with the patient and family. After consideration of risks, benefits and other options for treatment, the patient has consented to  Procedure(s) with comments: LEFT URETEROSCOPY/HOLMIUM LASER/STENT EXCHANGE (Left) - 60 MINUTES NEEDED FOR CASE as a surgical intervention.  The patient's history has been reviewed, patient examined, no change in status, stable for surgery.  I have reviewed the patient's chart and labs.  Questions were answered to the patient's satisfaction.     Kaylib Furness L Shanterria Franta

## 2022-02-22 NOTE — Discharge Instructions (Addendum)
[facility] [phone] Post Ureteroscopy With or Without Stent Instructions **remove stent by pulling on string Friday morning  Definitions:  Ureter: The duct that transports urine from the kidney to the bladder. Stent:   A plastic hollow tube that is placed into the ureter, from the kidney to the bladder to prevent the ureter from swelling shut.  GENERAL INSTRUCTIONS:  Despite the fact that no skin incisions were used, the area around the ureter and bladder is raw and irritated. The stent is a foreign body which will further irritate the bladder wall. This irritation is manifested by increased frequency of urination, both day and night, and by an increase in the urge to urinate. In some, the urge to urinate is present almost always. Sometimes the urge is strong enough that you may not be able to stop yourself from urinating. The only real cure is to remove the stent and then give time for the bladder wall to heal which can't be done until the danger of the ureter swelling shut has passed, which varies.  You may see some blood in your urine while the stent is in place and a few days afterwards. Do not be alarmed, even if the urine was clear for a while. Get off your feet and drink lots of fluids until clearing occurs. If you start to pass clots or don't improve, call us.  DIET: You may return to your normal diet immediately. Because of the raw surface of your bladder, alcohol, spicy foods, acid type foods and drinks with caffeine may cause irritation or frequency and should be used in moderation. To keep your urine flowing freely and to avoid constipation, drink plenty of fluids during the day ( 8-10 glasses ). Tip: Avoid cranberry juice because it is very acidic.  ACTIVITY: Your physical activity doesn't need to be restricted. However, if you are very active, you may see some blood in your urine. We suggest that you reduce your activity under these circumstances until the  bleeding has stopped.  BOWELS: It is important to keep your bowels regular during the postoperative period. Straining with bowel movements can cause bleeding. A bowel movement every other day is reasonable. Use a mild laxative if needed, such as Milk of Magnesia 2-3 tablespoons, or 2 Dulcolax tablets. Call if you continue to have problems. If you have been taking narcotics for pain, before, during or after your surgery, you may be constipated. Take a laxative if necessary.   MEDICATION: You should resume your pre-surgery medications unless told not to. In addition you will often be given an antibiotic to prevent infection and likely several as needed medications for stent related discomfort. These should be taken as prescribed until the bottles are finished unless you are having an unusual reaction to one of the drugs.  PROBLEMS YOU SHOULD REPORT TO US: Fevers over 100.5 Fahrenheit. Heavy bleeding, or clots ( See above notes about blood in urine ). Inability to urinate. Drug reactions ( hives, rash, nausea, vomiting, diarrhea ). Severe burning or pain with urination that is not improving.      

## 2022-02-23 ENCOUNTER — Encounter: Payer: BC Managed Care – PPO | Admitting: Family Medicine

## 2022-02-23 ENCOUNTER — Encounter (HOSPITAL_COMMUNITY): Payer: Self-pay | Admitting: Urology

## 2022-03-17 ENCOUNTER — Encounter: Payer: BC Managed Care – PPO | Admitting: Family Medicine

## 2022-03-24 ENCOUNTER — Encounter: Payer: Self-pay | Admitting: Family Medicine

## 2022-03-24 ENCOUNTER — Ambulatory Visit (INDEPENDENT_AMBULATORY_CARE_PROVIDER_SITE_OTHER): Payer: BC Managed Care – PPO | Admitting: Family Medicine

## 2022-03-24 VITALS — BP 106/71 | HR 59 | Temp 98.1°F | Ht 67.5 in | Wt 271.0 lb

## 2022-03-24 DIAGNOSIS — E782 Mixed hyperlipidemia: Secondary | ICD-10-CM | POA: Diagnosis not present

## 2022-03-24 DIAGNOSIS — Z1231 Encounter for screening mammogram for malignant neoplasm of breast: Secondary | ICD-10-CM

## 2022-03-24 DIAGNOSIS — E559 Vitamin D deficiency, unspecified: Secondary | ICD-10-CM | POA: Diagnosis not present

## 2022-03-24 DIAGNOSIS — Z0000 Encounter for general adult medical examination without abnormal findings: Secondary | ICD-10-CM

## 2022-03-24 DIAGNOSIS — J4540 Moderate persistent asthma, uncomplicated: Secondary | ICD-10-CM

## 2022-03-24 DIAGNOSIS — Z23 Encounter for immunization: Secondary | ICD-10-CM

## 2022-03-24 DIAGNOSIS — Z79899 Other long term (current) drug therapy: Secondary | ICD-10-CM

## 2022-03-24 DIAGNOSIS — I10 Essential (primary) hypertension: Secondary | ICD-10-CM

## 2022-03-24 DIAGNOSIS — M8580 Other specified disorders of bone density and structure, unspecified site: Secondary | ICD-10-CM | POA: Insufficient documentation

## 2022-03-24 LAB — CBC
HCT: 40.5 % (ref 36.0–46.0)
Hemoglobin: 13.2 g/dL (ref 12.0–15.0)
MCHC: 32.5 g/dL (ref 30.0–36.0)
MCV: 91.3 fl (ref 78.0–100.0)
Platelets: 223 10*3/uL (ref 150.0–400.0)
RBC: 4.44 Mil/uL (ref 3.87–5.11)
RDW: 14.6 % (ref 11.5–15.5)
WBC: 5.3 10*3/uL (ref 4.0–10.5)

## 2022-03-24 LAB — COMPREHENSIVE METABOLIC PANEL
ALT: 20 U/L (ref 0–35)
AST: 17 U/L (ref 0–37)
Albumin: 4 g/dL (ref 3.5–5.2)
Alkaline Phosphatase: 72 U/L (ref 39–117)
BUN: 14 mg/dL (ref 6–23)
CO2: 31 mEq/L (ref 19–32)
Calcium: 10.3 mg/dL (ref 8.4–10.5)
Chloride: 108 mEq/L (ref 96–112)
Creatinine, Ser: 0.6 mg/dL (ref 0.40–1.20)
GFR: 93.09 mL/min (ref >60.00)
Glucose, Bld: 81 mg/dL (ref 70–99)
Potassium: 4.1 mEq/L (ref 3.5–5.1)
Sodium: 141 mEq/L (ref 135–145)
Total Bilirubin: 0.5 mg/dL (ref 0.2–1.2)
Total Protein: 6.4 g/dL (ref 6.0–8.3)

## 2022-03-24 LAB — LIPID PANEL
Cholesterol: 124 mg/dL (ref 0–200)
HDL: 48.7 mg/dL (ref >39.00)
LDL Cholesterol: 63 mg/dL (ref 0–99)
NonHDL: 75.59
Total CHOL/HDL Ratio: 3
Triglycerides: 61 mg/dL (ref 0.0–149.0)
VLDL: 12.2 mg/dL (ref 0.0–40.0)

## 2022-03-24 LAB — TSH: TSH: 1.56 u[IU]/mL (ref 0.35–5.50)

## 2022-03-24 LAB — HEMOGLOBIN A1C: Hgb A1c MFr Bld: 5.6 % (ref 4.6–6.5)

## 2022-03-24 LAB — VITAMIN D 25 HYDROXY (VIT D DEFICIENCY, FRACTURES): VITD: 39.97 ng/mL (ref 30.00–100.00)

## 2022-03-24 MED ORDER — BUDESONIDE-FORMOTEROL FUMARATE 160-4.5 MCG/ACT IN AERO: 2.0000 | INHALATION_SPRAY | Freq: Two times a day (BID) | RESPIRATORY_TRACT | 11 refills | Status: DC

## 2022-03-24 MED ORDER — MOMETASONE FUROATE 50 MCG/ACT NA SUSP: 2.0000 | Freq: Every morning | NASAL | 11 refills | Status: DC

## 2022-03-24 MED ORDER — MONTELUKAST SODIUM 10 MG PO TABS: 10.0000 mg | ORAL_TABLET | Freq: Every day | ORAL | 3 refills | Status: DC

## 2022-03-24 MED ORDER — LEVOCETIRIZINE DIHYDROCHLORIDE 5 MG PO TABS: 5.0000 mg | ORAL_TABLET | Freq: Every evening | ORAL | 3 refills | Status: DC

## 2022-03-24 NOTE — Patient Instructions (Addendum)

## 2022-03-24 NOTE — Progress Notes (Signed)
Patient ID: [name], female  DOB: 1955/11/20, 66 y.o.   MRN: [id number] Patient Care Team    Relationship Specialty Notifications Start End  Natalia Leatherwood, DO PCP - General Family Medicine  12/04/17   Dorena Cookey, MD (Inactive) Consulting Physician Gastroenterology  12/04/17   Ollen Gross, MD Consulting Physician Orthopedic Surgery  12/04/17   Sidney Ace, MD Referring Physician Allergy  12/12/19   [place], Peter M, MD Consulting Physician Cardiology  12/12/19   Shamleffer, Konrad Dolores, MD Consulting Physician Endocrinology  12/12/19     Chief Complaint  Patient presents with   Annual Exam    Pt is fasting     Subjective: Alyssa Phillips is a 66 y.o.  Female  present for CPE/CMC All past medical history, surgical history, allergies, family history, immunizations, medications and social history were updated in the electronic medical record today. All recent labs, ED visits and hospitalizations within the last year were reviewed.  Health maintenance: Colonoscopy: completed 06/08/2016 , by Dr. Madilyn Fireman, resutls normal. follow up 10 year.  Due 2028 Mammogram: completed:07/04/2021, birads 1.  BC-Gso> ordered today Immunizations: tdap UTD 11/2018, Influenza completed (encouraged yearly), PNA completed, shingles completed Infectious disease screening: HIV and Hep C completed DEXA: UTD 09/2021, osteopenia -2.0> BC-GSO ordered by Endo Patient has a Dental home. Hospitalizations/ED visits: reviewed  Moderate persistent asthma without complication Patient reports her asthma and allergies have been stable.  She is compliant with Symbicort, albuterol as needed, Xyzal nightly, Singulair nightly and Nasonex.   Vertigo: She is prescribed meclizine and Zofran for her vertigo.  She has now seen ENT and vestibular rehab.  Her vertigo was from December 2021 to May 2022.  It then resolved with with vestibular rehab.  Reoccurred again in September for a few weeks.  Primary  hypertension/morbid obesity/hyperlipidemia/history of STEMI Patient was admitted for anterior STEMI 11/01/2017 an is under the care of cardiology Dr. Swaziland.  She was found to have severe single-vessel disease but 95% proximal LAD treated with DES x1.  EF 45 to 50%.  Medications are managed by cardiology.  Pt reports compliance with  ASA 81, Lipitor, metoprolol, candesartan. Blood pressures ranges at home within normal limits. Patient denies chest pain, shortness of breath, dizziness or lower extremity edema.  Diet: has changed to a heart healthy diet.  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  Levels have been stable.  Patient does ensure she is hydrating well.       03/24/2022   10:40 AM 03/14/2021   11:10 AM 12/11/2019    9:07 AM 12/09/2018    8:09 AM 04/08/2018    5:09 PM  Depression screen PHQ 2/9  Decreased Interest 0 0 0 0 0  Down, Depressed, Hopeless 0 0 0 0 0  PHQ - 2 Score 0 0 0 0 0  Altered sleeping   0    Tired, decreased energy   0    Change in appetite   0    Feeling bad or failure about yourself    0    Trouble concentrating   0    Moving slowly or fidgety/restless   0    Suicidal thoughts   0    PHQ-9 Score   0        12/11/2019    9:08 AM  GAD 7 : Generalized Anxiety Score  Nervous, Anxious, on Edge 0  Control/stop worrying 0  Worry too much - different things 0  Trouble relaxing 0  Restless 0  Easily annoyed or irritable 0  Afraid - awful might happen 0  Total GAD 7 Score 0     Immunization History  Administered Date(s) Administered   Fluad Quad(high Dose 65+) 02/08/2021, 03/24/2022   Influenza Split 05/19/2008, 02/01/2010, 01/31/2011, 01/30/2012   Influenza, High Dose Seasonal PF 01/29/2018, 02/10/2019   Influenza,inj,Quad PF,6+ Mos 03/10/2015, 12/22/2016, 12/04/2017, 12/09/2018, 12/11/2019   Influenza,inj,Quad PF,6-35 Mos  05/15/2016   Influenza,inj,quad, With Preservative 01/01/2014, 12/18/2016   Influenza-Unspecified 05/15/2016   PFIZER(Purple Top)SARS-COV-2 Vaccination 06/14/2019, 07/09/2019, 11/03/2019, 01/20/2020   PNEUMOCOCCAL CONJUGATE-20 03/24/2022   Pneumococcal Polysaccharide-23 12/04/2017, 01/29/2018   Tdap 05/13/2010, 12/09/2018   Zoster Recombinat (Shingrix) 12/11/2019, 08/01/2021    Past Medical History:  Diagnosis Date   Allergic rhinitis    Arthritis    Osteoarthrits-knees-hx. RTKA   Asthma    environmental agents induced asthma   Carpal tunnel syndrome of left wrist 02/19/2020   Carpal tunnel syndrome of right wrist 02/19/2020   Coronary artery disease    Family history of adverse reaction to anesthesia    daughter very sensitive to anesthesia   GERD (gastroesophageal reflux disease)    Heart murmur    hx. mitral valve proplapse-mostly asymptomatic-->not apprectiaed n echo   Hepatic steatosis 03/05/2008   [place]   History of kidney stones    Hyperlipidemia    Hypertension    Morbid obesity due to excess calories (HCC)    Pain in joint of left shoulder 06/12/2017   Pain in left foot 04/04/2018   Pain of joint of left ankle and foot 12/18/2019   Pneumonia    STEMI (ST elevation myocardial infarction) (HCC) 10/2017   LAD treated with DESx1   UTI (urinary tract infection)    Vitamin D deficiency    Allergies  Allergen Reactions   Compazine [Prochlorperazine Edisylate] Other (See Comments)    Eyes rolled back, tongue curled up   Advil [Ibuprofen] Hives   Aspirin Other (See Comments)    HAS to be EC aspirin!!   Past Surgical History:  Procedure Laterality Date   5th finger surgery Right 1990   unknown injury   CARDIAC CATHETERIZATION     CARPAL TUNNEL RELEASE  08/2020   CHOLECYSTECTOMY     COLONOSCOPY WITH PROPOFOL N/A 06/08/2016   Procedure: COLONOSCOPY WITH PROPOFOL;  Surgeon: Dorena Cookey, MD;  Location: [facility] ENDOSCOPY;  Service: Endoscopy;  Laterality: N/A;   CORONARY  STENT INTERVENTION N/A 11/01/2017   Procedure: CORONARY STENT INTERVENTION;  Surgeon: Swaziland, Peter M, MD;  Location: [facility] INVASIVE CV LAB;  Service: Cardiovascular;  Laterality: N/A;  Resolute Onyx 4.5 mm x12 mm   CORONARY/GRAFT ACUTE MI REVASCULARIZATION N/A 11/01/2017   Procedure: Coronary/Graft Acute MI Revascularization;  Surgeon: Swaziland, Peter M, MD;  Location: [facility] INVASIVE CV LAB;  Service: Cardiovascular;  Laterality: N/A;   CYSTOSCOPY W/ URETERAL STENT PLACEMENT Left 02/06/2022   Procedure: CYSTOSCOPY WITH RETROGRADE PYELOGRAM/URETERAL STENT PLACEMENT;  Surgeon: Despina Arias, MD;  Location: [facility] ORS;  Service: Urology;  Laterality: Left;   CYSTOSCOPY/URETEROSCOPY/HOLMIUM LASER/STENT PLACEMENT Left 02/22/2022   Procedure: LEFT URETEROSCOPY/HOLMIUM LASER/STENT EXCHANGE;  Surgeon: Despina Arias, MD;  Location: [facility] ORS;  Service: Urology;  Laterality: Left;  60 MINUTES NEEDED FOR CASE   I & D EXTREMITY Left 06/30/2021   Procedure: IRRIGATION AND DEBRIDEMENT left elbow;  Surgeon: Gomez Cleverly, MD;  Location: [facility] OR;  Service: Orthopedics;  Laterality: Left;  KNEE ARTHROSCOPY  05/02/2012   x2 right/ x1 left   LEFT HEART CATH AND CORONARY ANGIOGRAPHY N/A 11/01/2017   Procedure: LEFT HEART CATH AND CORONARY ANGIOGRAPHY;  Surgeon: SwazilandJordan, Peter M, MD;  [facility];  Service: Cardiovascular;  Laterality: N/A;   REPLACEMENT TOTAL KNEE Right 10/2010   ROOT CANAL     TOTAL KNEE ARTHROPLASTY  05/10/2012   Procedure: TOTAL KNEE ARTHROPLASTY;  Surgeon: Loanne DrillingFrank V Aluisio, MD;  Location: [facility] ORS;  Service: Orthopedics;  Laterality: Left;   TUBAL LIGATION     UMBILICAL HERNIA REPAIR     Family History  Problem Relation Age of Onset   Hypertension Mother    Arthritis Mother    Lymphoma Mother    Asthma Mother    Cancer Mother    Hearing loss Mother    Miscarriages / [place] Mother    COPD Father    Alcohol abuse Father    Arthritis Father    Hearing loss Father     Hypertension Father    Miscarriages / [place] Sister    Arthritis Maternal Grandmother    Hearing loss Maternal Grandmother    Breast cancer Maternal Grandmother [id number]   Arthritis Maternal Grandfather    Diabetes Maternal Grandfather    Hypertension Maternal Grandfather    Stroke Maternal Grandfather    Arthritis Paternal Grandmother    Diabetes Paternal Grandmother    Hearing loss Paternal Grandmother    Hearing loss Paternal Grandfather    Learning disabilities Paternal Grandfather    Social History   Social History Narrative   Marital status/children/pets: married   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        Medication List        Accurate as of March 24, 2022 11:27 AM. If you have any questions, ask your nurse or doctor.          STOP taking these medications    cephALEXin 500 MG capsule Commonly known as: Keflex Stopped by: Alyssa Pacinienee Fumiye Lubben, DO   oxyCODONE-acetaminophen 5-325 MG tablet Commonly known as: Percocet Stopped by: Alyssa Pacinienee Nyeisha Goodall, DO   phenazopyridine 200 MG tablet Commonly known as: Pyridium Stopped by: Alyssa Pacinienee Annaka Cleaver, DO   psyllium 58.6 % powder Commonly known as: METAMUCIL Stopped by: Alyssa Pacinienee Davieon Stockham, DO   tamsulosin 0.4 MG Caps capsule Commonly known as: FLOMAX Stopped by: Alyssa Pacinienee Rosamary Boudreau, DO   Uro-MP 118 MG Caps Stopped by: Alyssa Pacinienee Tyreka Henneke, DO       TAKE these medications    acetaminophen 500 MG tablet Commonly known as: TYLENOL Take 1,000 mg by mouth every 6 (six) hours as needed for moderate pain.   albuterol 108 (90 Base) MCG/ACT inhaler Commonly known as: VENTOLIN HFA Inhale 1-2  puffs into the lungs every 4 (four) hours as needed for wheezing or shortness of breath.   albuterol (2.5 MG/3ML) 0.083% nebulizer solution Commonly known as: PROVENTIL Take 3 mLs (2.5 mg total) by nebulization every 6 (six) hours as needed for wheezing or shortness of breath.   aspirin EC 81  MG tablet Take 1 tablet (81 mg total) by mouth daily.   atorvastatin 80 MG tablet Commonly known as: LIPITOR Take 1 tablet (80 mg total) by mouth every evening.   azelastine 0.05 % ophthalmic solution Commonly known as: OPTIVAR Place 1 drop into both eyes daily as needed (itching).   B COMPLEX 1 PO Take 1 tablet by mouth at bedtime.   budesonide-formoterol 160-4.5 MCG/ACT inhaler Commonly known as: SYMBICORT Inhale 2 puffs into the lungs 2 (two) times daily. Must keep scheduled appt for further refills   candesartan 4 MG tablet Commonly known as: ATACAND Take 1 tablet (4 mg total) by mouth daily.   cholecalciferol 1000 units tablet Commonly known as: VITAMIN D Take 1,000 Units by mouth 2 (two) times daily.   CO Q 10 PO Take 1 capsule by mouth daily.   diclofenac sodium 1 % Gel Commonly known as: VOLTAREN Apply 2-4 g topically daily as needed (knee pain).   levalbuterol 1.25 MG/3ML nebulizer solution Commonly known as: XOPENEX Take 1.25 mg by nebulization every 8 (eight) hours as needed for wheezing or shortness of breath.   levocetirizine 5 MG tablet Commonly known as: XYZAL Take 1 tablet (5 mg total) by mouth every evening.   meclizine 25 MG tablet Commonly known as: ANTIVERT Take 25 mg by mouth 3 (three) times daily as needed for dizziness.   metoprolol succinate 50 MG 24 hr tablet Commonly known as: TOPROL-XL Take 1 tablet (50 mg total) by mouth daily. Take with or immediately following a meal.   mometasone 50 MCG/ACT nasal spray Commonly known as: NASONEX Place 2 sprays into the nose every morning. What changed:  when to take this reasons to take this    montelukast 10 MG tablet Commonly known as: SINGULAIR Take 1 tablet (10 mg total) by mouth at bedtime. What changed: when to take this   multivitamin tablet Take 1 tablet by mouth daily.   Nebulizer Devi 1 Device by Does not apply route 3 (three) times daily as needed.   nitroGLYCERIN 0.4 MG SL tablet Commonly known as: NITROSTAT PLACE 1 TABLET UNDER THE TONGUE EVERY 5 MINUTES AS NEEDED FOR CHEST PAIN.   ondansetron 4 MG disintegrating tablet Commonly known as: ZOFRAN-ODT Take 1 tablet (4 mg total) by mouth every 8 (eight) hours as needed for nausea or vomiting.   TIGER BALM ARTHRITIS RUB EX Apply 1 application. topically at bedtime as needed (shoulder pain).   VITAMIN C PO Take 1 tablet by mouth daily.   ZINC PO Take 1 tablet by mouth at bedtime.        All past medical history, surgical history, allergies, family history, immunizations andmedications were updated in the EMR today and reviewed under the history and medication portions of their EMR.      ROS: 14 pt review of systems performed and negative (unless mentioned in an HPI)  Objective: BP 106/71   Pulse (!) 59   Temp 98.1 F (36.7 C) (Oral)   Ht 5' 7.5" (1.715 m)   Wt 271 lb (122.9 kg)   SpO2 99%   BMI 41.82 kg/m  Physical Exam Vitals and nursing note reviewed.  Constitutional:      General: She is not in acute distress.    Appearance: Normal appearance. She is obese. She is not ill-appearing or toxic-appearing.  HENT:     Head: Normocephalic and atraumatic.     Right Ear: Tympanic membrane, ear canal and external ear normal. There is no impacted cerumen.  Left Ear: Tympanic membrane, ear canal and external ear normal. There is no impacted cerumen.     Nose: No congestion or rhinorrhea.     Mouth/Throat:     Mouth: Mucous membranes are moist.     Pharynx: Oropharynx is clear. No oropharyngeal exudate or posterior oropharyngeal erythema.  Eyes:     General: No scleral icterus.       Right  eye: No discharge.        Left eye: No discharge.     Extraocular Movements: Extraocular movements intact.     Conjunctiva/sclera: Conjunctivae normal.     Pupils: Pupils are equal, round, and reactive to light.  Cardiovascular:     Rate and Rhythm: Normal rate and regular rhythm.     Pulses: Normal pulses.     Heart sounds: Normal heart sounds. No murmur heard.    No friction rub. No gallop.  Pulmonary:     Effort: Pulmonary effort is normal. No respiratory distress.     Breath sounds: Normal breath sounds. No stridor. No wheezing, rhonchi or rales.  Chest:     Chest wall: No tenderness.  Abdominal:     General: Abdomen is flat. Bowel sounds are normal. There is no distension.     Palpations: Abdomen is soft. There is no mass.     Tenderness: There is no abdominal tenderness. There is no right CVA tenderness, left CVA tenderness, guarding or rebound.     Hernia: No hernia is present.  Musculoskeletal:        General: No swelling, tenderness or deformity. Normal range of motion.     Cervical back: Normal range of motion and neck supple. No rigidity or tenderness.     Right lower leg: No edema.     Left lower leg: No edema.  Lymphadenopathy:     Cervical: No cervical adenopathy.  Skin:    General: Skin is warm and dry.     Coloration: Skin is not jaundiced or pale.     Findings: No bruising, erythema, lesion or rash.  Neurological:     General: No focal deficit present.     Mental Status: She is alert and oriented to person, place, and time. Mental status is at baseline.     Cranial Nerves: No cranial nerve deficit.     Sensory: No sensory deficit.     Motor: No weakness.     Coordination: Coordination normal.     Gait: Gait normal.     Deep Tendon Reflexes: Reflexes normal.  Psychiatric:        Mood and Affect: Mood normal.        Behavior: Behavior normal.        Thought Content: Thought content normal.        Judgment: Judgment normal.     No results  found.  Assessment/plan: ALLYE HOYOS is a 66 y.o. female present for CPE/CMC Hypertension/mixed hyperlipidemia/STEMI involving left anterior descending coronary artery (HCC)/morbid obesity Routine diet and exercise. Continue follow-ups with cardiology who manages all medications for cardiac condition-metoprolol, candesartan, Lipitor, baby aspirin, nitro Cbc, cmp, tsh,lipids, A1c  collected today  Moderate persistent asthma without complication Stable Continue  Xyzal Continue  Singulair Inhalers prescribed by allergist Continue allergy follow-ups  Hyperparathyroidism (HCC)/hypercalcemia - PTH, Intact and Calcium -Continue routine follow-ups per endocrinology  Vitamin D deficiency/Osteopenia, unspecified location - VITAMIN D 25 Hydroxy (Vit-D Deficiency, Fractures) Encounter for long-term current use of medication - Hemoglobin A1c Breast cancer screening by mammogram -  MM 3D SCREEN BREAST BILATERAL; Future  Need for influenza vaccination/Need for pneumococcal 20-valent conjugate vaccination - Pneumococcal conjugate vaccine 20-valent (Prevnar 20) - Flu Vaccine QUAD High Dose(Fluad)  Routine general medical examination at a health care facility Patient was encouraged to exercise greater than 150 minutes a week. Patient was encouraged to choose a diet filled with fresh fruits and vegetables, and lean meats. AVS provided to patient today for education/recommendation on gender specific health and safety maintenance.  Return in about 1 year (around 03/26/2023) for cpe (20 min).   Orders Placed This Encounter  Procedures   MM 3D SCREEN BREAST BILATERAL   Pneumococcal conjugate vaccine 20-valent (Prevnar 20)   Flu Vaccine QUAD High Dose(Fluad)   CBC   Comprehensive metabolic panel   Hemoglobin A1c   Lipid panel   TSH   VITAMIN D 25 Hydroxy (Vit-D Deficiency, Fractures)   Meds ordered this encounter  Medications   budesonide-formoterol (SYMBICORT) 160-4.5 MCG/ACT inhaler     Sig: Inhale 2 puffs into the lungs 2 (two) times daily. Must keep scheduled appt for further refills    Dispense:  1 each    Refill:  11   levocetirizine (XYZAL) 5 MG tablet    Sig: Take 1 tablet (5 mg total) by mouth every evening.    Dispense:  90 tablet    Refill:  3   mometasone (NASONEX) 50 MCG/ACT nasal spray    Sig: Place 2 sprays into the nose every morning.    Dispense:  17 g    Refill:  11   montelukast (SINGULAIR) 10 MG tablet    Sig: Take 1 tablet (10 mg total) by mouth at bedtime.    Dispense:  90 tablet    Refill:  3   Referral Orders  No referral(s) requested today     Electronically signed by: Alyssa Pacini, DO [facility] [place]

## 2022-04-03 DIAGNOSIS — N201 Calculus of ureter: Secondary | ICD-10-CM | POA: Diagnosis not present

## 2022-04-03 DIAGNOSIS — N390 Urinary tract infection, site not specified: Secondary | ICD-10-CM | POA: Diagnosis not present

## 2022-04-03 DIAGNOSIS — B9620 Unspecified Escherichia coli [E. coli] as the cause of diseases classified elsewhere: Secondary | ICD-10-CM | POA: Diagnosis not present

## 2022-04-25 ENCOUNTER — Other Ambulatory Visit: Payer: Self-pay | Admitting: Family Medicine

## 2022-04-25 DIAGNOSIS — J4540 Moderate persistent asthma, uncomplicated: Secondary | ICD-10-CM

## 2022-04-25 NOTE — Telephone Encounter (Signed)
Please advise on alt? 

## 2022-04-26 MED ORDER — LEVALBUTEROL HCL 1.25 MG/3ML IN NEBU: 1.2500 mg | INHALATION_SOLUTION | Freq: Three times a day (TID) | RESPIRATORY_TRACT | 11 refills | Status: DC | PRN

## 2022-04-26 NOTE — Telephone Encounter (Signed)
Please inform patient we received a pharmacy notification that her inhalers will need to change due to formulary change in her insurance. I have called in [place] inhaler, which replaces her Symbicort.  It is 1 puff twice a day. I refilled her Xopenex nebulizer solution. The Xopenex inhaler is no longer covered by her insurance.

## 2022-05-05 DIAGNOSIS — N1330 Unspecified hydronephrosis: Secondary | ICD-10-CM | POA: Diagnosis not present

## 2022-05-05 DIAGNOSIS — N130 Hydronephrosis with ureteropelvic junction obstruction: Secondary | ICD-10-CM | POA: Diagnosis not present

## 2022-05-26 DIAGNOSIS — M19072 Primary osteoarthritis, left ankle and foot: Secondary | ICD-10-CM | POA: Diagnosis not present

## 2022-05-26 DIAGNOSIS — M25571 Pain in right ankle and joints of right foot: Secondary | ICD-10-CM | POA: Diagnosis not present

## 2022-06-22 ENCOUNTER — Telehealth: Payer: BC Managed Care – PPO | Admitting: Physician Assistant

## 2022-06-22 DIAGNOSIS — J019 Acute sinusitis, unspecified: Secondary | ICD-10-CM

## 2022-06-22 DIAGNOSIS — M25562 Pain in left knee: Secondary | ICD-10-CM | POA: Diagnosis not present

## 2022-06-22 DIAGNOSIS — Z96653 Presence of artificial knee joint, bilateral: Secondary | ICD-10-CM | POA: Diagnosis not present

## 2022-06-22 DIAGNOSIS — B9789 Other viral agents as the cause of diseases classified elsewhere: Secondary | ICD-10-CM | POA: Diagnosis not present

## 2022-06-22 DIAGNOSIS — J029 Acute pharyngitis, unspecified: Secondary | ICD-10-CM | POA: Diagnosis not present

## 2022-06-22 DIAGNOSIS — Z6841 Body Mass Index (BMI) 40.0 and over, adult: Secondary | ICD-10-CM | POA: Diagnosis not present

## 2022-06-22 DIAGNOSIS — I10 Essential (primary) hypertension: Secondary | ICD-10-CM | POA: Diagnosis not present

## 2022-06-22 NOTE — Progress Notes (Signed)
E-Visit for Sinus Problems  We are sorry that you are not feeling well.  Here is how we plan to help!  Based on what you have shared with me it looks like you have sinusitis.  Sinusitis is inflammation and infection in the sinus cavities of the head.  Based on your presentation I believe you most likely have Acute Viral Sinusitis.This is an infection most likely caused by a virus. There is not specific treatment for viral sinusitis other than to help you with the symptoms until the infection runs its course.  You may use an oral decongestant such as Mucinex D or if you have glaucoma or high blood pressure use plain Mucinex. Saline nasal spray help and can safely be used as often as needed for congestion, I have prescribed: Fluticasone nasal spray two sprays in each nostril once a day  Some authorities believe that zinc sprays or the use of Echinacea may shorten the course of your symptoms.  Sinus infections are not as easily transmitted as other respiratory infection, however we still recommend that you avoid close contact with loved ones, especially the very young and elderly.  Remember to wash your hands thoroughly throughout the day as this is the number one way to prevent the spread of infection!  Home Care: Only take medications as instructed by your medical team. Do not take these medications with alcohol. A steam or ultrasonic humidifier can help congestion.  You can place a towel over your head and breathe in the steam from hot water coming from a faucet. Avoid close contacts especially the very young and the elderly. Cover your mouth when you cough or sneeze. Always remember to wash your hands.  Get Help Right Away If: You develop worsening fever or sinus pain. You develop a severe head ache or visual changes. Your symptoms persist after you have completed your treatment plan.  Make sure you Understand these instructions. Will watch your condition. Will get help right away if you  are not doing well or get worse.   Thank you for choosing an e-visit.  Your e-visit answers were reviewed by a board certified advanced clinical practitioner to complete your personal care plan. Depending upon the condition, your plan could have included both over the counter or prescription medications.  Please review your pharmacy choice. Make sure the pharmacy is open so you can pick up prescription now. If there is a problem, you may contact your provider through MyChart messaging and have the prescription routed to another pharmacy.  Your safety is important to us. If you have drug allergies check your prescription carefully.   For the next 24 hours you can use MyChart to ask questions about today's visit, request a non-urgent call back, or ask for a work or school excuse. You will get an email in the next two days asking about your experience. I hope that your e-visit has been valuable and will speed your recovery.   

## 2022-06-22 NOTE — Progress Notes (Signed)
I have spent 5 minutes in review of e-visit questionnaire, review and updating patient chart, medical decision making and response to patient.   Lianna Sitzmann Cody Sharnay Cashion, PA-C    

## 2022-06-28 ENCOUNTER — Other Ambulatory Visit: Payer: Self-pay | Admitting: Family Medicine

## 2022-06-28 DIAGNOSIS — J4540 Moderate persistent asthma, uncomplicated: Secondary | ICD-10-CM

## 2022-07-04 ENCOUNTER — Ambulatory Visit: Payer: BC Managed Care – PPO | Admitting: Family Medicine

## 2022-07-04 ENCOUNTER — Encounter: Payer: Self-pay | Admitting: Family Medicine

## 2022-07-04 VITALS — BP 124/83 | HR 75 | Temp 97.7°F

## 2022-07-04 DIAGNOSIS — J301 Allergic rhinitis due to pollen: Secondary | ICD-10-CM

## 2022-07-04 DIAGNOSIS — J4540 Moderate persistent asthma, uncomplicated: Secondary | ICD-10-CM

## 2022-07-04 DIAGNOSIS — J329 Chronic sinusitis, unspecified: Secondary | ICD-10-CM | POA: Diagnosis not present

## 2022-07-04 DIAGNOSIS — R051 Acute cough: Secondary | ICD-10-CM | POA: Diagnosis not present

## 2022-07-04 DIAGNOSIS — B9689 Other specified bacterial agents as the cause of diseases classified elsewhere: Secondary | ICD-10-CM

## 2022-07-04 MED ORDER — DOXYCYCLINE HYCLATE 100 MG PO TABS: 100.0000 mg | ORAL_TABLET | Freq: Two times a day (BID) | ORAL | 0 refills | Status: DC

## 2022-07-04 NOTE — Patient Instructions (Addendum)
Return if symptoms worsen or fail to improve.        Great to see you today.  I have refilled the medication(s) we provide.   If labs were collected, we will inform you of lab results once received either by echart message or telephone call.   - echart message- for normal results that have been seen by the patient already.   - telephone call: abnormal results or if patient has not viewed results in their echart.  

## 2022-07-04 NOTE — Progress Notes (Signed)
Alyssa Phillips , [birth year], 67 y.o., female MRN: [id number] Patient Care Team    Relationship Specialty Notifications Start End  Ma Hillock, DO PCP - General Family Medicine  12/04/17   Teena Irani, MD (Inactive) Consulting Physician Gastroenterology  12/04/17   Gaynelle Arabian, MD Consulting Physician Orthopedic Surgery  12/04/17   Mosetta Anis, MD Referring Physician Allergy  12/12/19   Martinique, Peter M, MD Consulting Physician Cardiology  12/12/19   Shamleffer, Melanie Crazier, MD Consulting Physician Endocrinology  12/12/19     Chief Complaint  Patient presents with   Cough    4 weeks     Subjective: Alyssa Phillips is a 67 y.o. Pt presents for an OV with complaints of cough- persistent cough of 4 weeks duration.  Associated symptoms include wheezing, fatigue, sinus congestion. Patient is compliant with Xyzal, Singulair, Nasonex, Wixela, albuterol as needed. [facility] for acute ph[facility]itis.  No antibiotic or prescription was provided.     07/04/2022    1:06 PM 03/24/2022   10:40 AM 03/14/2021   11:10 AM 12/11/2019    9:07 AM 12/09/2018    8:09 AM  Depression screen PHQ 2/9  Decreased Interest 0 0 0 0 0  Down, Depressed, Hopeless 0 0 0 0 0  PHQ - 2 Score 0 0 0 0 0  Altered sleeping    0   Tired, decreased energy    0   Change in appetite    0   Feeling bad or failure about yourself     0   Trouble concentrating    0   Moving slowly or fidgety/restless    0   Suicidal thoughts    0   PHQ-9 Score    0     Allergies  Allergen Reactions   Compazine [Prochlorperazine Edisylate] Other (See Comments)    Eyes rolled back, tongue curled up   Advil [Ibuprofen] Hives   Aspirin Other (See Comments)    HAS to be EC aspirin!!   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 RTKA   Asthma    environmental agents induced asthma   Carpal tunnel syndrome of left wrist 02/19/2020   Carpal tunnel syndrome of right wrist 02/19/2020   Coronary artery disease    Family history of adverse reaction to anesthesia    daughter very sensitive to anesthesia   GERD (gastroesophageal reflux disease)    Heart murmur    hx. mitral valve proplapse-mostly asymptomatic-->not apprectiaed n echo   Hepatic steatosis 03/05/2008   [place]   History of kidney stones    Hyperlipidemia    Hypertension    Morbid obesity due to excess calories (HCC)    Pain in joint of left shoulder 06/12/2017   Pain in left foot 04/04/2018   Pain of joint of left ankle and foot 12/18/2019   Pneumonia    STEMI (ST elevation myocardial infarction) ([place]) 10/2017   LAD treated with DESx1   UTI (urinary tract infection)    Vitamin D deficiency    Past Surgical History:  Procedure Laterality Date  5th finger surgery Right 1990   unknown injury   CARDIAC CATHETERIZATION     CARPAL TUNNEL RELEASE  08/2020   CHOLECYSTECTOMY     COLONOSCOPY WITH PROPOFOL N/A 06/08/2016   Procedure: COLONOSCOPY WITH PROPOFOL;  Surgeon: Teena Irani, MD;  Location: [facility];  Service: Endoscopy;  Laterality: N/A;   CORONARY STENT INTERVENTION N/A 11/01/2017   Procedure: CORONARY STENT INTERVENTION;  Surgeon: Martinique, Peter M, MD;  Location: [facility];  Service: Cardiovascular;  Laterality: N/A;  Resolute Onyx 4.5 mm x12 mm   CORONARY/GRAFT ACUTE MI REVASCULARIZATION N/A 11/01/2017   Procedure: Coronary/Graft Acute MI Revascularization;  Surgeon: Martinique, Peter M, MD;  Location: [facility];  Service: Cardiovascular;  Laterality: N/A;   CYSTOSCOPY W/ URETERAL STENT PLACEMENT Left 02/06/2022   Procedure: CYSTOSCOPY WITH RETROGRADE PYELOGRAM/URETERAL STENT PLACEMENT;   Surgeon: Vira Agar, MD;  Location: [facility];  Service: Urology;  Laterality: Left;   CYSTOSCOPY/URETEROSCOPY/HOLMIUM LASER/STENT PLACEMENT Left 02/22/2022   Procedure: LEFT URETEROSCOPY/HOLMIUM LASER/STENT EXCHANGE;  Surgeon: Vira Agar, MD;  Location: [facility];  Service: Urology;  Laterality: Left;  60 MINUTES NEEDED FOR CASE   I & D EXTREMITY Left 06/30/2021   Procedure: IRRIGATION AND DEBRIDEMENT left elbow;  Surgeon: Orene Desanctis, MD;  Location: [facility];  Service: Orthopedics;  Laterality: Left;   KNEE ARTHROSCOPY  05/02/2012   x2 right/ x1 left   LEFT HEART CATH AND CORONARY ANGIOGRAPHY N/A 11/01/2017   Procedure: LEFT HEART CATH AND CORONARY ANGIOGRAPHY;  Surgeon: Martinique, Peter M, MD;  Location: [facility];  Service: Cardiovascular;  Laterality: N/A;   REPLACEMENT TOTAL KNEE Right 10/2010   ROOT CANAL     TOTAL KNEE ARTHROPLASTY  05/10/2012   Procedure: TOTAL KNEE ARTHROPLASTY;  Surgeon: Gearlean Alf, MD;  Location: [facility];  Service: Orthopedics;  Laterality: Left;   TUBAL LIGATION     UMBILICAL HERNIA REPAIR     Family History  Problem Relation Age of Onset   Hypertension Mother    Arthritis Mother    Lymphoma Mother    Asthma Mother    Cancer Mother    Hearing loss Mother    Miscarriages / [place] Mother    COPD Father    Alcohol abuse Father    Arthritis Father    Hearing loss Father    Hypertension Father    Miscarriages / [place] Sister    Arthritis Maternal Grandmother    Hearing loss Maternal Grandmother    Breast cancer Maternal Grandmother 76   Arthritis Maternal Grandfather    Diabetes Maternal Grandfather    Hypertension Maternal Grandfather    Stroke Maternal Grandfather    Arthritis Paternal Grandmother    Diabetes Paternal Grandmother    Hearing loss Paternal Grandmother    Hearing loss Paternal Grandfather    Learning disabilities Paternal Grandfather    Allergies as of 07/04/2022       Reactions   Compazine [prochlorperazine  Edisylate] Other (See Comments)   Eyes rolled back, tongue curled up   Advil [ibuprofen] Hives   Aspirin Other (See Comments)   HAS to be EC aspirin!!        Medication List        Accurate as of July 04, 2022  1:33 PM. If you have any questions, ask your nurse or doctor.          acetaminophen 500 MG tablet Commonly known as: TYLENOL Take 1,000 mg by mouth every 6 (six) hours  as needed for moderate pain.   albuterol 108 (90 Base) MCG/ACT inhaler Commonly known as: VENTOLIN HFA Inhale 1-2 puffs into the lungs every 4 (four) hours as needed for wheezing or shortness of breath.   albuterol (2.5 MG/3ML) 0.083% nebulizer solution Commonly known as: PROVENTIL Take 3 mLs (2.5 mg total) by nebulization every 6 (six) hours as needed for wheezing or shortness of breath.   aspirin EC 81 MG tablet Take 1 tablet (81 mg total) by mouth daily.   atorvastatin 80 MG tablet Commonly known as: LIPITOR Take 1 tablet (80 mg total) by mouth every evening.   azelastine 0.05 % ophthalmic solution Commonly known as: OPTIVAR Place 1 drop into both eyes daily as needed (itching).   B COMPLEX 1 PO Take 1 tablet by mouth at bedtime.   candesartan 4 MG tablet Commonly known as: ATACAND Take 1 tablet (4 mg total) by mouth daily.   cholecalciferol 1000 units tablet Commonly known as: VITAMIN D Take 1,000 Units by mouth 2 (two) times daily.   CO Q 10 PO Take 1 capsule by mouth daily.   diclofenac sodium 1 % Gel Commonly known as: VOLTAREN Apply 2-4 g topically daily as needed (knee pain).   doxycycline 100 MG tablet Commonly known as: VIBRA-TABS Take 1 tablet (100 mg total) by mouth 2 (two) times daily. Started by: Howard Pouch, DO   fluticasone-salmeterol 250-50 MCG/ACT Aepb Commonly known as: Wixela Inhub Inhale 1 puff into the lungs in the morning and at bedtime.   levalbuterol 1.25 MG/3ML nebulizer solution Commonly known as: XOPENEX Take 1.25 mg by nebulization every 8  (eight) hours as needed for wheezing or shortness of breath.   levocetirizine 5 MG tablet Commonly known as: XYZAL Take 1 tablet (5 mg total) by mouth every evening.   meclizine 25 MG tablet Commonly known as: ANTIVERT Take 25 mg by mouth 3 (three) times daily as needed for dizziness.   metoprolol succinate 50 MG 24 hr tablet Commonly known as: TOPROL-XL Take 1 tablet (50 mg total) by mouth daily. Take with or immediately following a meal.   mometasone 50 MCG/ACT nasal spray Commonly known as: NASONEX Place 2 sprays into the nose every morning.   montelukast 10 MG tablet Commonly known as: SINGULAIR Take 1 tablet (10 mg total) by mouth at bedtime.   multivitamin tablet Take 1 tablet by mouth daily.   Nebulizer Devi 1 Device by Does not apply route 3 (three) times daily as needed.   nitroGLYCERIN 0.4 MG SL tablet Commonly known as: NITROSTAT PLACE 1 TABLET UNDER THE TONGUE EVERY 5 MINUTES AS NEEDED FOR CHEST PAIN.   ondansetron 4 MG disintegrating tablet Commonly known as: ZOFRAN-ODT Take 1 tablet (4 mg total) by mouth every 8 (eight) hours as needed for nausea or vomiting.   TIGER BALM ARTHRITIS RUB EX Apply 1 application. topically at bedtime as needed (shoulder pain).   VITAMIN C PO Take 1 tablet by mouth daily.   ZINC PO Take 1 tablet by mouth at bedtime.        All past medical history, surgical history, allergies, family history, immunizations andmedications were updated in the EMR today and reviewed under the history and medication portions of their EMR.     Review of Systems  Constitutional:  Positive for malaise/fatigue. Negative for chills and fever.  HENT:  Positive for congestion and sinus pain. Negative for ear discharge, ear pain and sore throat.   Respiratory:  Positive for cough and wheezing.  Cardiovascular: Negative.   Gastrointestinal: Negative.   Musculoskeletal: Negative.   Skin:  Negative for rash.  Neurological: Negative.     Negative, with the exception of above mentioned in HPI   Objective: BP 124/83   Pulse 75   Temp 97.7 F (36.5 C)   SpO2 97%  There is no height or weight on file to calculate BMI. Physical Exam Vitals and nursing note reviewed.  Constitutional:      General: She is not in acute distress.    Appearance: Normal appearance. She is normal weight. She is not ill-appearing or toxic-appearing.  HENT:     Head: Normocephalic and atraumatic.  Eyes:     General: No scleral icterus.       Right eye: No discharge.        Left eye: No discharge.     Extraocular Movements: Extraocular movements intact.     Conjunctiva/sclera: Conjunctivae normal.     Pupils: Pupils are equal, round, and reactive to light.  Cardiovascular:     Rate and Rhythm: Normal rate and regular rhythm.  Pulmonary:     Effort: Pulmonary effort is normal. No respiratory distress.     Breath sounds: Normal breath sounds. No wheezing, rhonchi or rales.  Skin:    Findings: No rash.  Neurological:     Mental Status: She is alert and oriented to person, place, and time. Mental status is at baseline.     Motor: No weakness.     Coordination: Coordination normal.     Gait: Gait normal.  Psychiatric:        Mood and Affect: Mood normal.        Behavior: Behavior normal.        Thought Content: Thought content normal.        Judgment: Judgment normal.     No results found. No results found. No results found for this or any previous visit (from the past 24 hour(s)).  Assessment/Plan: Alyssa Phillips is a 67 y.o. female present for OV for  Acute cough/ Bacterial sinusitis Rest, hydrate.  mucinex (DM if cough), nettie pot or nasal saline.  Doxy bid prescribed, take until completed.  If cough present it can last up to 6-8 weeks.  F/U 2 weeks of not improved.   Moderate persistent asthma without complication Start the wixela.  Allergic rhinitis due to pollen, unspecified seasonality Continue nasonex,  Singulair, zyrtec  Reviewed expectations re: course of current medical issues. Discussed self-management of symptoms. Outlined signs and symptoms indicating need for more acute intervention. Patient verbalized understanding and all questions were answered. Patient received an After-Visit Summary.    No orders of the defined types were placed in this encounter.  Meds ordered this encounter  Medications   doxycycline (VIBRA-TABS) 100 MG tablet    Sig: Take 1 tablet (100 mg total) by mouth 2 (two) times daily.    Dispense:  20 tablet    Refill:  0   Referral Orders  No referral(s) requested today     Note is dictated utilizing voice recognition software. Although note has been proof read prior to signing, occasional typographical errors still can be missed. If any questions arise, please do not hesitate to call for verification.   electronically signed by:  Howard Pouch, DO  [place]

## 2022-07-06 ENCOUNTER — Ambulatory Visit
Admission: RE | Admit: 2022-07-06 | Discharge: 2022-07-06 | Disposition: A | Discharge status: Home / Self Care | Payer: BC Managed Care – PPO | Source: Ambulatory Visit | Attending: Family Medicine | Admitting: Family Medicine

## 2022-07-06 DIAGNOSIS — Z1231 Encounter for screening mammogram for malignant neoplasm of breast: Secondary | ICD-10-CM | POA: Diagnosis not present

## 2022-07-28 ENCOUNTER — Other Ambulatory Visit: Payer: Self-pay | Admitting: Family Medicine

## 2022-07-28 DIAGNOSIS — J4540 Moderate persistent asthma, uncomplicated: Secondary | ICD-10-CM

## 2022-07-31 ENCOUNTER — Encounter: Payer: Self-pay | Admitting: Internal Medicine

## 2022-07-31 ENCOUNTER — Ambulatory Visit: Payer: BC Managed Care – PPO | Admitting: Internal Medicine

## 2022-07-31 VITALS — BP 126/82 | HR 60 | Ht 67.5 in | Wt 281.0 lb

## 2022-07-31 DIAGNOSIS — M8589 Other specified disorders of bone density and structure, multiple sites: Secondary | ICD-10-CM | POA: Diagnosis not present

## 2022-07-31 DIAGNOSIS — E213 Hyperparathyroidism, unspecified: Secondary | ICD-10-CM | POA: Diagnosis not present

## 2022-07-31 LAB — VITAMIN D 25 HYDROXY (VIT D DEFICIENCY, FRACTURES): VITD: 43.47 ng/mL (ref 30.00–100.00)

## 2022-07-31 LAB — BASIC METABOLIC PANEL
BUN: 15 mg/dL (ref 6–23)
CO2: 25 mEq/L (ref 19–32)
Calcium: 10.3 mg/dL (ref 8.4–10.5)
Chloride: 108 mEq/L (ref 96–112)
Creatinine, Ser: 0.65 mg/dL (ref 0.40–1.20)
GFR: 91.08 mL/min (ref >60.00)
Glucose, Bld: 87 mg/dL (ref 70–99)
Potassium: 4.4 mEq/L (ref 3.5–5.1)
Sodium: 140 mEq/L (ref 135–145)

## 2022-07-31 LAB — ALBUMIN: Albumin: 3.9 g/dL (ref 3.5–5.2)

## 2022-07-31 NOTE — Progress Notes (Unsigned)
Name: Alyssa Phillips  MRN/ DOB: [id number], Jan 09, 1956    Age/ Sex: 67 y.o., female     PCP: Natalia Leatherwood, DO   Reason for Endocrinology Evaluation: Hypercalcemia      Initial Endocrinology Clinic Visit: 09/05/2019    PATIENT IDENTIFIER: Alyssa Phillips is a 67 y.o., female with a past medical history of  CAD ( 10/2017) , HTN and Dyslipidemia.  She has followed with [facility] since 09/05/2019 for consultative assistance with management of her Hyperthyroidism  HISTORICAL SUMMARY:  Alyssa Phillips indicates that she was first diagnosed with hypercalcemia in 06/2019 with a serum calcium of 10.9 mg/dL ( Corrected 40.98) during routine check up   she has a history of right toes fracture  fracture in the past ( S/P slip injury) , left lower leg fracture and has osteopenia based on DXA6/2023  Ca/Cr ratio was 0.0054  24-hour urinary calcium excretion 276 mg/2023  SUBJECTIVE:   Today (07/31/2022):  Alyssa Phillips is here for a follow up on hypercalcemia.   Patient was diagnosed with left ureteral stone, she is s/p cystoscopy with left ureteral stent placement 01/2022  Denies recent falls  No recent bone fractures  Denies constipation  Denies Polyuria or polydipsia   Not on OTC calcium  Consumes 2  Servings of dairy  She is on vitamin D    HISTORY:  Past Medical History:  Past Medical History:  Diagnosis Date   Allergic rhinitis    Arthritis    Osteoarthrits-knees-hx. RTKA   Asthma    environmental agents induced asthma   Carpal tunnel syndrome of left wrist 02/19/2020   Carpal tunnel syndrome of right wrist 02/19/2020   Coronary artery disease    Family history of adverse reaction to anesthesia    daughter very sensitive to anesthesia   GERD (gastroesophageal reflux disease)    Heart murmur    hx. mitral valve proplapse-mostly asymptomatic-->not apprectiaed n echo   Hepatic steatosis 03/05/2008   [place]   History of kidney stones    Hyperlipidemia     Hypertension    Morbid obesity due to excess calories    Pain in joint of left shoulder 06/12/2017   Pain in left foot 04/04/2018   Pain of joint of left ankle and foot 12/18/2019   Pneumonia    STEMI (ST elevation myocardial infarction) 10/2017   LAD treated with DESx1   UTI (urinary tract infection)    Vitamin D deficiency    Past Surgical History:  Past Surgical History:  Procedure Laterality Date   5th finger surgery Right 1990   unknown injury   CARDIAC CATHETERIZATION     CARPAL TUNNEL RELEASE  08/2020   CHOLECYSTECTOMY     COLONOSCOPY WITH PROPOFOL N/A 06/08/2016   Procedure: COLONOSCOPY WITH PROPOFOL;  Surgeon: Dorena Cookey, MD;  Location: [facility] ENDOSCOPY;  Service: Endoscopy;  Laterality: N/A;   CORONARY STENT INTERVENTION N/A 11/01/2017   Procedure: CORONARY STENT INTERVENTION;  Surgeon: Swaziland, Peter M, MD;  Location: [facility] INVASIVE CV LAB;  Service: Cardiovascular;  Laterality: N/A;  Resolute Onyx 4.5 mm x12 mm   CORONARY/GRAFT ACUTE MI REVASCULARIZATION N/A 11/01/2017   Procedure: Coronary/Graft Acute MI Revascularization;  Surgeon: Swaziland, Peter M, MD;  Location: [facility] INVASIVE CV LAB;  Service: Cardiovascular;  Laterality: N/A;   CYSTOSCOPY W/ URETERAL STENT PLACEMENT Left 02/06/2022   Procedure: CYSTOSCOPY WITH RETROGRADE PYELOGRAM/URETERAL STENT PLACEMENT;  Surgeon: Despina Arias, MD;  Location: [facility] ORS;  Service: Urology;  Laterality:  Left;   CYSTOSCOPY/URETEROSCOPY/HOLMIUM LASER/STENT PLACEMENT Left 02/22/2022   Procedure: LEFT URETEROSCOPY/HOLMIUM LASER/STENT EXCHANGE;  Surgeon: Despina Arias, MD;  Location: [facility] ORS;  Service: Urology;  Laterality: Left;  60 MINUTES NEEDED FOR CASE   I & D EXTREMITY Left 06/30/2021   Procedure: IRRIGATION AND DEBRIDEMENT left elbow;  Surgeon: Gomez Cleverly, MD;  Location[facility];  Service: Orthopedics;  Laterality: Left;   KNEE ARTHROSCOPY  05/02/2012   x2 right/ x1 left   LEFT HEART CATH AND CORONARY ANGIOGRAPHY N/A 11/01/2017    Procedure: LEFT HEART CATH AND CORONARY ANGIOGRAPHY;  Surgeon: Swaziland, Peter M, MD;  Location: [facility] INVASIVE CV LAB;  Service: Cardiovascular;  Laterality: N/A;   REPLACEMENT TOTAL KNEE Right 10/2010   ROOT CANAL     TOTAL KNEE ARTHROPLASTY  05/10/2012   Procedure: TOTAL KNEE ARTHROPLASTY;  Surgeon: Loanne Drilling, MD;  Location: [facility] ORS;  Service: Orthopedics;  Laterality: Left;   TUBAL LIGATION     UMBILICAL HERNIA REPAIR     Social History:  reports that she has never smoked. She has never used smokeless tobacco. She reports current alcohol use. She reports that she does not use drugs. Family History:  Family History  Problem Relation Age of Onset   Hypertension Mother    Arthritis Mother    Lymphoma Mother    Asthma Mother    Cancer Mother    Hearing loss Mother    Miscarriages / [place] Mother    COPD Father    Alcohol abuse Father    Arthritis Father    Hearing loss Father    Hypertension Father    Miscarriages / [place] Sister    Arthritis Maternal Grandmother    Hearing loss Maternal Grandmother    Breast cancer Maternal Grandmother 62   Arthritis Maternal Grandfather    Diabetes Maternal Grandfather    Hypertension Maternal Grandfather    Stroke Maternal Grandfather    Arthritis Paternal Grandmother    Diabetes Paternal Grandmother    Hearing loss Paternal Grandmother    Hearing loss Paternal Grandfather    Learning disabilities Paternal Grandfather      HOME MEDICATIONS: Allergies as of 07/31/2022       Reactions   Compazine [prochlorperazine Edisylate] Other (See Comments)   Eyes rolled back, tongue curled up   Advil [ibuprofen] Hives   Aspirin Other (See Comments)   HAS to be EC aspirin!!        Medication List        Accurate as of July 31, 2022  7:42 AM. If you have any questions, ask your nurse or doctor.          STOP taking these medications    doxycycline 100 MG tablet Commonly known as: VIBRA-TABS Stopped by: Scarlette Shorts, MD       TAKE these medications    acetaminophen 500 MG tablet Commonly known as: TYLENOL Take 1,000 mg by mouth every 6 (six) hours as needed for moderate pain.   albuterol 108 (90 Base) MCG/ACT inhaler Commonly known as: VENTOLIN HFA Inhale 1-2 puffs into the lungs every 4 (four) hours as needed for wheezing or shortness of breath.   albuterol (2.5 MG/3ML) 0.083% nebulizer solution Commonly known as: PROVENTIL Take 3 mLs (2.5 mg total) by nebulization every 6 (six) hours as needed for wheezing or shortness of breath.   aspirin EC 81 MG tablet Take 1 tablet (81 mg total) by mouth daily.   atorvastatin 80 MG tablet Commonly  known as: LIPITOR Take 1 tablet (80 mg total) by mouth every evening.   azelastine 0.05 % ophthalmic solution Commonly known as: OPTIVAR Place 1 drop into both eyes daily as needed (itching).   B COMPLEX 1 PO Take 1 tablet by mouth at bedtime.   candesartan 4 MG tablet Commonly known as: ATACAND Take 1 tablet (4 mg total) by mouth daily.   cholecalciferol 1000 units tablet Commonly known as: VITAMIN D Take 1,000 Units by mouth 2 (two) times daily.   CO Q 10 PO Take 1 capsule by mouth daily.   diclofenac sodium 1 % Gel Commonly known as: VOLTAREN Apply 2-4 g topically daily as needed (knee pain).   levalbuterol 1.25 MG/3ML nebulizer solution Commonly known as: XOPENEX Take 1.25 mg by nebulization every 8 (eight) hours as needed for wheezing or shortness of breath.   levocetirizine 5 MG tablet Commonly known as: XYZAL Take 1 tablet (5 mg total) by mouth every evening.   meclizine 25 MG tablet Commonly known as: ANTIVERT Take 25 mg by mouth 3 (three) times daily as needed for dizziness.   metoprolol succinate 50 MG 24 hr tablet Commonly known as: TOPROL-XL Take 1 tablet (50 mg total) by mouth daily. Take with or immediately following a meal.   mometasone 50 MCG/ACT nasal spray Commonly known as: NASONEX Place 2 sprays into  the nose every morning.   montelukast 10 MG tablet Commonly known as: SINGULAIR Take 1 tablet (10 mg total) by mouth at bedtime.   multivitamin tablet Take 1 tablet by mouth daily.   Nebulizer Devi 1 Device by Does not apply route 3 (three) times daily as needed.   nitroGLYCERIN 0.4 MG SL tablet Commonly known as: NITROSTAT PLACE 1 TABLET UNDER THE TONGUE EVERY 5 MINUTES AS NEEDED FOR CHEST PAIN.   ondansetron 4 MG disintegrating tablet Commonly known as: ZOFRAN-ODT Take 1 tablet (4 mg total) by mouth every 8 (eight) hours as needed for nausea or vomiting.   TIGER BALM ARTHRITIS RUB EX Apply 1 application. topically at bedtime as needed (shoulder pain).   VITAMIN C PO Take 1 tablet by mouth daily.   Wixela Inhub 250-50 MCG/ACT Aepb Generic drug: fluticasone-salmeterol INHALE 1 PUFF INTO THE LUNGS IN THE MORNING AND AT BEDTIME.   ZINC PO Take 1 tablet by mouth at bedtime.          OBJECTIVE:   PHYSICAL EXAM: VS: BP 126/82 (BP Location: Left Arm, Patient Position: Sitting, Cuff Size: Large)   Pulse 60   Ht 5' 7.5" (1.715 m)   Wt 281 lb (127.5 kg)   SpO2 96%   BMI 43.36 kg/m    EXAM: General: Pt appears well and is in NAD  Neck: General: Supple without adenopathy. Thyroid: Thyroid size normal.  No goiter or nodules appreciated. No thyroid bruit.  Lungs: Clear with good BS bilat with no rales, rhonchi, or wheezes  Heart: Auscultation: RRR.  Abdomen: Normoactive bowel sounds, soft, nontender, without masses or organomegaly palpable  Extremities:  BL LE: No pretibial edema on right, left boot in place  Mental Status: Judgment, insight: Intact Orientation: Oriented to time, place, and person Mood and affect: No depression, anxiety, or agitation     DATA REVIEWED:  Latest Reference Range & Units 07/31/22 07:58  Sodium 135 - 145 mEq/L 140  Potassium 3.5 - 5.1 mEq/L 4.4  Chloride 96 - 112 mEq/L 108  CO2 19 - 32 mEq/L 25  Glucose 70 - 99 mg/dL 87  BUN  6 -  23 mg/dL 15  Creatinine 0.10 - 2.72 mg/dL 5.36  Calcium 8.4 - 64.4 mg/dL 03.4  Albumin 3.5 - 5.2 g/dL 3.9  GFR >74.25 mL/min 91.08    Latest Reference Range & Units 07/31/22 07:58  VITD 30.00 - 100.00 ng/mL 43.47     DXA 09/16/2021 ASSESSMENT: The BMD measured at Femur Neck Left is 0.758 g/cm2 with a T-score of -2.0. This patient is considered osteopenic/low bone mass according to World Health Organization [facility]) criteria.     Site Region Measured Date Measured Age YA BMD Significant CHANGE T-score DualFemur Neck Left 09/16/2021 66.5 -2.0 0.758 g/cm2 * DualFemur Neck Left 02/24/2019 64.0 -1.4 0.838 g/cm2   DualFemur Total Mean 09/16/2021 66.5 -1.4 0.836 g/cm2 * DualFemur Total Mean 02/24/2019 64.0 -0.9 0.891 g/cm2   Left Forearm Radius 33% 09/16/2021 66.5 -1.7 0.731 g/cm2     ASSESSMENT / PLAN / RECOMMENDATIONS:   Hyperparathyroidism:     - Pt MET surgical criteria for parathyroidectomy due to urinary stones and elevated 24 hr urine calcium  - Her 24-hr urinary excretions of calcium was 84 mg,09/2019 with repeat 24-hour urinary calcium slightly elevated at 276 mg/19/2023, will repeat again this year -DXA  Shows osteopenia (09/2021) -Serum calcium, vitamin D, and GFR are normal on today's labs, PTH pending  Recommendations  - Stay Hydrated  - Avoid OVER THE COUNTER calcium tablets - Make sure you consume 2-3 servings of calcium a day in your diet  -Continue vitamin D daily   2.  Low bone density:   -This was diagnosed on DEXA scan 02/2019, repeat DXA continues to show osteopenia 09/2021 -Emphasized the importance of optimizing vitamin D intake, and consuming 2-3 dietary servings of calcium daily    Follow-up in 6 months   Signed electronically by: Lyndle Herrlich, MD  [facility] Endocr[facility]ology  [facility] [address] [place] [place], [place] [address] Phone: [phone] FAX: [phone]      CC: Natalia Leatherwood, DO [address] [place] [address] Phone: [phone]  Fax: [phone]   Return to Endocrinology clinic as below: Future Appointments  Date Time Provider Department Center  03/29/2023  8:00 AM Kuneff, Renee A, DO [facility]

## 2022-07-31 NOTE — Patient Instructions (Signed)
-   Stay Hydrated  - Avoid OVER THE COUNTER calcium tablets - Make sure you consume 2-3 servings of calcium a day in your diet     24-Hour Urine Collection   You will be collecting your urine for a 24-hour period of time.  Your timer starts with your first urine of the morning (For example - If you first pee at 9AM, your timer will start at 9AM)  Throw away your first urine of the morning  Collect your urine every time you pee for the next 24 hours STOP your urine collection 24 hours after you started the collection (For example - You would stop at 9AM the day after you started)  

## 2022-08-01 LAB — CALCIUM, IONIZED: Calcium, Ion: 5.7 mg/dL — ABNORMAL HIGH (ref 4.7–5.5)

## 2022-08-01 LAB — PARATHYROID HORMONE, INTACT (NO CA): PTH: 65 pg/mL (ref 16–77)

## 2022-08-07 ENCOUNTER — Other Ambulatory Visit: Payer: BC Managed Care – PPO

## 2022-08-07 DIAGNOSIS — E213 Hyperparathyroidism, unspecified: Secondary | ICD-10-CM | POA: Diagnosis not present

## 2022-08-07 NOTE — Progress Notes (Unsigned)
Start date 08/04/22 End date 08/05/2022  Start time 0822 End time 6:33  Total volume

## 2022-08-08 LAB — TIQ-MISC

## 2022-08-09 LAB — CALCIUM, URINE, 24 HOUR: Calcium, 24H Urine: 317 mg/24 h — ABNORMAL HIGH

## 2022-08-09 LAB — CREATININE, URINE, RANDOM: Creatinine, Urine: 72 mg/dL (ref 20–275)

## 2022-08-10 ENCOUNTER — Telehealth: Payer: Self-pay | Admitting: Cardiology

## 2022-08-10 ENCOUNTER — Other Ambulatory Visit: Payer: Self-pay | Admitting: Cardiology

## 2022-08-10 ENCOUNTER — Telehealth: Payer: Self-pay | Admitting: Internal Medicine

## 2022-08-10 DIAGNOSIS — E213 Hyperparathyroidism, unspecified: Secondary | ICD-10-CM

## 2022-08-10 MED ORDER — NITROGLYCERIN 0.4 MG SL SUBL: SUBLINGUAL_TABLET | SUBLINGUAL | 0 refills | Status: AC

## 2022-08-10 NOTE — Telephone Encounter (Signed)
Refill for Nitroglycerin has been sent to [facility]. 

## 2022-08-10 NOTE — Telephone Encounter (Signed)
Spoke to the patient on 08/10/2022 at 9:15 AM   Discussed elevated 24-hour urine calcium excretion   We discussed meeting surgical criteria for parathyroidectomy  We have opted to proceed with parathyroid imaging first

## 2022-08-10 NOTE — Telephone Encounter (Signed)
*  STAT* If patient is at the pharmacy, call can be transferred to refill team.   1. Which medications need to be refilled? (please list name of each medication and dose if known)  nitroGLYCERIN (NITROSTAT) 0.4 MG SL tablet   2. Which pharmacy/location (including street and city if local pharmacy) is medication to be sent to?  [facility] [address] [place], [place], [place] [address]   3. Do they need a 30 day or 90 day supply?    Patient is out of town and forgot her nitro

## 2022-08-16 DIAGNOSIS — A4902 Methicillin resistant Staphylococcus aureus infection, unspecified site: Secondary | ICD-10-CM

## 2022-08-16 HISTORY — DX: Methicillin resistant Staphylococcus aureus infection, unspecified site: A49.02

## 2022-08-28 ENCOUNTER — Encounter (HOSPITAL_COMMUNITY)
Admission: RE | Admit: 2022-08-28 | Discharge: 2022-08-28 | Disposition: A | Discharge status: Home / Self Care | Payer: BC Managed Care – PPO | Source: Ambulatory Visit | Attending: Internal Medicine | Admitting: Internal Medicine

## 2022-08-28 ENCOUNTER — Telehealth: Payer: Self-pay | Admitting: Family Medicine

## 2022-08-28 ENCOUNTER — Encounter: Payer: Self-pay | Admitting: Family Medicine

## 2022-08-28 ENCOUNTER — Telehealth: Payer: BC Managed Care – PPO | Admitting: Family Medicine

## 2022-08-28 DIAGNOSIS — L0291 Cutaneous abscess, unspecified: Secondary | ICD-10-CM

## 2022-08-28 DIAGNOSIS — E213 Hyperparathyroidism, unspecified: Secondary | ICD-10-CM | POA: Insufficient documentation

## 2022-08-28 DIAGNOSIS — E210 Primary hyperparathyroidism: Secondary | ICD-10-CM | POA: Diagnosis not present

## 2022-08-28 MED ORDER — TECHNETIUM TC 99M SESTAMIBI GENERIC - CARDIOLITE
25.0000 | Freq: Once | INTRAVENOUS | Status: AC | PRN
  Administered 2022-08-28: 24.8 via INTRAVENOUS

## 2022-08-28 MED ORDER — AMOXICILLIN-POT CLAVULANATE 875-125 MG PO TABS: 1.0000 | ORAL_TABLET | Freq: Two times a day (BID) | ORAL | 0 refills | Status: DC

## 2022-08-28 NOTE — Telephone Encounter (Signed)
Spoke with patient regarding results/recommendations.  

## 2022-08-28 NOTE — Telephone Encounter (Signed)
Patient called and states that her finger has a sore red spot from removing a hair on her knuckle. She reports that she scratched it to release fluid and now she is concerned about an infection. She is requesting an ointment to put on the area. I informed her an appointment is needed, but would send the request back also. Patient declined appointment.

## 2022-08-28 NOTE — Progress Notes (Signed)
VIRTUAL VISIT VIA VIDEO  I connected with Alyssa Phillips on 08/28/22 at  2:40 PM EDT by a video enabled telemedicine application and verified that I am speaking with the correct person using two identifiers. Location patient: Home Location provider: [facility], Office Persons participating in the virtual visit: Patient, Dr. Claiborne Billings and Ivonne Andrew, CMA  I discussed the limitations of evaluation and management by telemedicine and the availability of in person appointments. The patient expressed understanding and agreed to proceed.   Alyssa Phillips , [birth year], 67 y.o., female MRN: [id number] Patient Care Team    Relationship Specialty Notifications Start End  Natalia Leatherwood, DO PCP - General Family Medicine  12/04/17   Dorena Cookey, MD (Inactive) Consulting Physician Gastroenterology  12/04/17   Ollen Gross, MD Consulting Physician Orthopedic Surgery  12/04/17   Sidney Ace, MD Referring Physician Allergy  12/12/19   [place], Peter M, MD Consulting Physician Cardiology  12/12/19   Shamleffer, Konrad Dolores, MD Consulting Physician Endocrinology  12/12/19     Chief Complaint  Patient presents with   ingrown hair    Right hand (pointer finger); yesterday tried to open it with no drainage      Subjective: Alyssa Phillips is a 67 y.o. Pt presents for an OV with complaints of redness and swelling of index finger of right hand.  She reports it is mildly swollen at this location.  She noticed a small abscess or infested hair follicle and attempted to drain it.  Since then the area has become more swollen and red. She denies fever or chills.    07/04/2022    1:06 PM 03/24/2022   10:40 AM 03/14/2021   11:10 AM 12/11/2019    9:07 AM 12/09/2018    8:09 AM  Depression screen PHQ 2/9  Decreased Interest 0 0 0 0 0  Down, Depressed, Hopeless 0 0 0 0 0  PHQ - 2 Score 0 0 0 0 0  Altered sleeping    0   Tired, decreased energy    0   Change in appetite    0   Feeling bad or  failure about yourself     0   Trouble concentrating    0   Moving slowly or fidgety/restless    0   Suicidal thoughts    0   PHQ-9 Score    0     Allergies  Allergen Reactions   Compazine [Prochlorperazine Edisylate] Other (See Comments)    Eyes rolled back, tongue curled up   Advil [Ibuprofen] Hives   Aspirin Other (See Comments)    HAS to be EC aspirin!!   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 RTKA   Asthma    environmental agents induced asthma   Carpal tunnel syndrome of left wrist 02/19/2020   Carpal tunnel syndrome of right wrist 02/19/2020   Coronary artery disease    Family history of adverse reaction to anesthesia    daughter very sensitive to anesthesia   GERD (gastroesophageal reflux disease)    Heart murmur    hx. mitral valve proplapse-mostly  asymptomatic-->not apprectiaed n echo   Hepatic steatosis 03/05/2008   [place]   History of kidney stones    Hyperlipidemia    Hypertension    Morbid obesity due to excess calories (HCC)    Pain in joint of left shoulder 06/12/2017   Pain in left foot 04/04/2018   Pain of joint of left ankle and foot 12/18/2019   Pneumonia    STEMI (ST elevation myocardial infarction) (HCC) 10/2017   LAD treated with DESx1   UTI (urinary tract infection)    Vitamin D deficiency    Past Surgical History:  Procedure Laterality Date   5th finger surgery Right 1990   unknown injury   CARDIAC CATHETERIZATION     CARPAL TUNNEL RELEASE  08/2020   CHOLECYSTECTOMY     COLONOSCOPY WITH PROPOFOL N/A 06/08/2016   Procedure: COLONOSCOPY WITH PROPOFOL;  Surgeon: Dorena Cookey, MD;  Location: [facility] ENDOSCOPY;  Service: Endoscopy;  Laterality:  N/A;   CORONARY STENT INTERVENTION N/A 11/01/2017   Procedure: CORONARY STENT INTERVENTION;  Surgeon: Swaziland, Peter M, MD;  Location: [facility] INVASIVE CV LAB;  Service: Cardiovascular;  Laterality: N/A;  Resolute Onyx 4.5 mm x12 mm   CORONARY/GRAFT ACUTE MI REVASCULARIZATION N/A 11/01/2017   Procedure: Coronary/Graft Acute MI Revascularization;  Surgeon: Swazil[place], Peter M, MD;  Location: [facility] INVASIVE CV LAB;  Service: Cardiovascular;  Laterality: N/A;   CYSTOSCOPY W/ URETERAL STENT PLACEMENT Left 02/06/2022   Procedure: CYSTOSCOPY WITH RETROGRADE PYELOGRAM/URETERAL STENT PLACEMENT;  Surgeon: Despina Arias, MD;  Location: [facility] ORS;  Service: Urology;  Laterality: Left;   CYSTOSCOPY/URETEROSCOPY/HOLMIUM LASER/STENT PLACEMENT Left 02/22/2022   Procedure: LEFT URETEROSCOPY/HOLMIUM LASER/STENT EXCHANGE;  Surgeon: Despina Arias, MD;  Location: [facility] ORS;  Service: Urology;  Laterality: Left;  60 MINUTES NEEDED FOR CASE   I & D EXTREMITY Left 06/30/2021   Procedure: IRRIGATION AND DEBRIDEMENT left elbow;  Surgeon: Gomez Cleverly, MD;  Location: [facility] OR;  Service: Orthopedics;  Laterality: Left;   KNEE ARTHROSCOPY  05/02/2012   x2 right/ x1 left   LEFT HEART CATH AND CORONARY ANGIOGRAPHY N/A 11/01/2017   Procedure: LEFT HEART CATH AND CORONARY ANGIOGRAPHY;  Surgeon: [place]aziland, Peter M, MD;  Location: [facility] INVASIVE CV LAB;  Service: Cardiovascular;  Laterality: N/A;   REPLACEMENT TOTAL KNEE Right 10/2010   ROOT CANAL     TOTAL KNEE ARTHROPLASTY  05/10/2012   Procedure: TOTAL KNEE ARTHROPLASTY;  Surgeon: Loanne Drilling, MD;  Location: [facility] ORS;  Service: Orthopedics;  Laterality: Left;   TUBAL LIGATION     UMBILICAL HERNIA REPAIR     Family History  Problem Relation Age of Onset   Hypertension Mother    Arthritis Mother    Lymphoma Mother    Asthma Mother    Cancer Mother    Hearing loss Mother    Miscarriages / [place] Mother    COPD Father    Alcohol abuse Father    Arthritis Father    Hearing loss  Father    Hypertension Father    Miscarriages / [place] Sister    Arthritis Maternal Grandmother    Hearing loss Maternal Grandmother    Breast cancer Maternal Grandmother 70   Arthritis Maternal Grandfather    Diabetes Maternal Grandfather    Hypertension Maternal Grandfather    Stroke Maternal Grandfather    Arthritis Paternal Grandmother    Diabetes Paternal Grandmother    Hearing loss Paternal Grandmother    Hearing loss Paternal Grandfather    Learning disabilities Paternal Grandfather  Allergies as of 08/28/2022       Reactions   Compazine [prochlorperazine Edisylate] Other (See Comments)   Eyes rolled back, tongue curled up   Advil [ibuprofen] Hives   Aspirin Other (See Comments)   HAS to be EC aspirin!!        Medication List        Accurate as of Aug 28, 2022  2:42 PM. If you have any questions, ask your nurse or doctor.          acetaminophen 500 MG tablet Commonly known as: TYLENOL Take 1,000 mg by mouth every 6 (six) hours as needed for moderate pain.   albuterol 108 (90 Base) MCG/ACT inhaler Commonly known as: VENTOLIN HFA Inhale 1-2 puffs into the lungs every 4 (four) hours as needed for wheezing or shortness of breath.   albuterol (2.5 MG/3ML) 0.083% nebulizer solution Commonly known as: PROVENTIL Take 3 mLs (2.5 mg total) by nebulization every 6 (six) hours as needed for wheezing or shortness of breath.   aspirin EC 81 MG tablet Take 1 tablet (81 mg total) by mouth daily.   atorvastatin 80 MG tablet Commonly known as: LIPITOR TAKE 1 TABLET BY MOUTH EVERY EVENING   azelastine 0.05 % ophthalmic solution Commonly known as: OPTIVAR Place 1 drop into both eyes daily as needed (itching).   B COMPLEX 1 PO Take 1 tablet by mouth at bedtime.   candesartan 4 MG tablet Commonly known as: ATACAND Take 1 tablet (4 mg total) by mouth daily.   cholecalciferol 1000 units tablet Commonly known as: VITAMIN D Take 1,000 Units by mouth 2 (two)  times daily.   CO Q 10 PO Take 1 capsule by mouth daily.   diclofenac sodium 1 % Gel Commonly known as: VOLTAREN Apply 2-4 g topically daily as needed (knee pain).   levalbuterol 1.25 MG/3ML nebulizer solution Commonly known as: XOPENEX Take 1.25 mg by nebulization every 8 (eight) hours as needed for wheezing or shortness of breath.   levocetirizine 5 MG tablet Commonly known as: XYZAL Take 1 tablet (5 mg total) by mouth every evening.   meclizine 25 MG tablet Commonly known as: ANTIVERT Take 25 mg by mouth 3 (three) times daily as needed for dizziness.   metoprolol succinate 50 MG 24 hr tablet Commonly known as: TOPROL-XL TAKE 1 TABLET BY MOUTH DAILY. TAKE WITH OR IMMEDIATELY FOLLOWING A MEAL.   mometasone 50 MCG/ACT nasal spray Commonly known as: NASONEX Place 2 sprays into the nose every morning.   montelukast 10 MG tablet Commonly known as: SINGULAIR Take 1 tablet (10 mg total) by mouth at bedtime.   multivitamin tablet Take 1 tablet by mouth daily.   Nebulizer Devi 1 Device by Does not apply route 3 (three) times daily as needed.   nitroGLYCERIN 0.4 MG SL tablet Commonly known as: NITROSTAT PLACE 1 TABLET UNDER THE TONGUE EVERY 5 MINUTES AS NEEDED FOR CHEST PAIN.   ondansetron 4 MG disintegrating tablet Commonly known as: ZOFRAN-ODT Take 1 tablet (4 mg total) by mouth every 8 (eight) hours as needed for nausea or vomiting.   TIGER BALM ARTHRITIS RUB EX Apply 1 application. topically at bedtime as needed (shoulder pain).   VITAMIN C PO Take 1 tablet by mouth daily.   Wixela Inhub 250-50 MCG/ACT Aepb Generic drug: fluticasone-salmeterol INHALE 1 PUFF INTO THE LUNGS IN THE MORNING AND AT BEDTIME.   ZINC PO Take 1 tablet by mouth at bedtime.        All past  medical history, surgical history, allergies, family history, immunizations andmedications were updated in the EMR today and reviewed under the history and medication portions of their EMR.      ROS Negative, with the exception of above mentioned in HPI   Objective:  There were no vitals taken for this visit. There is no height or weight on file to calculate BMI. Physical Exam Vitals and nursing note reviewed.  Constitutional:      General: She is not in acute distress.    Appearance: Normal appearance. She is not toxic-appearing.  HENT:     Head: Normocephalic and atraumatic.  Eyes:     General: No scleral icterus.       Right eye: No discharge.        Left eye: No discharge.     Conjunctiva/sclera: Conjunctivae normal.  Pulmonary:     Effort: Pulmonary effort is normal.  Musculoskeletal:        General: Swelling and tenderness present.     Cervical back: Normal range of motion.     Comments: Right index finger: Erythema, swelling, small abscess present right index.  Normal range of motion neurovascular intact distally.  Skin:    Findings: No rash.  Neurological:     Mental Status: She is alert and oriented to person, place, and time. Mental status is at baseline.  Psychiatric:        Mood and Affect: Mood normal.        Behavior: Behavior normal.        Thought Content: Thought content normal.        Judgment: Judgment normal.      No results found. No results found. No results found for this or any previous visit (from the past 24 hour(s)).  Assessment/Plan: Alyssa Phillips is a 67 y.o. female present for OV for  1. Abscess Right index finger with small abscess at area where patient tried to drain what sounds like may be an infected hair follicle? Abscesses not involving joint, but is close to tendon sheath. Start Augmentin twice daily x 7 days. Closely monitor to ensure infection does not cause infection of the tendon sheath.  We discussed this today and she will continue to monitor closely at home and follow-up in 1 week or sooner if symptoms or not improving with antibiotic use.  Reviewed expectations re: course of current medical  issues. Discussed self-management of symptoms. Outlined signs and symptoms indicating need for more acute intervention. Patient verbalized understanding and all questions were answered. Patient received an After-Visit Summary.    No orders of the defined types were placed in this encounter.  No orders of the defined types were placed in this encounter.  Referral Orders  No referral(s) requested today     Note is dictated utilizing voice recognition software. Although note has been proof read prior to signing, occasional typographical errors still can be missed. If any questions arise, please do not hesitate to call for verification.   electronically signed by:  Felix Pacini, DO  [facility] - [place]

## 2022-08-31 ENCOUNTER — Ambulatory Visit: Payer: BC Managed Care – PPO | Admitting: Family Medicine

## 2022-08-31 ENCOUNTER — Encounter: Payer: Self-pay | Admitting: Family Medicine

## 2022-08-31 VITALS — BP 118/77 | HR 61 | Temp 97.4°F

## 2022-08-31 DIAGNOSIS — L0291 Cutaneous abscess, unspecified: Secondary | ICD-10-CM | POA: Diagnosis not present

## 2022-08-31 NOTE — Patient Instructions (Signed)
Return in about 4 days (around 09/04/2022).        Great to see you today.  I have refilled the medication(s) we provide.   If labs were collected, we will inform you of lab results once received either by echart message or telephone call.   - echart message- for normal results that have been seen by the patient already.   - telephone call: abnormal results or if patient has not viewed results in their echart.

## 2022-08-31 NOTE — Progress Notes (Signed)
Alyssa Phillips , 1955/06/17, 67 y.o., female MRN: [id number] Patient Care Team    Relationship Specialty Notifications Start End  Natalia Leatherwood, DO PCP - General Family Medicine  12/04/17   Dorena Cookey, MD (Inactive) Consulting Physician Gastroenterology  12/04/17   Ollen Gross, MD Consulting Physician Orthopedic Surgery  12/04/17   Sidney Ace, MD Referring Physician Allergy  12/12/19   Sw[place], Peter M, MD Consulting Physician Cardiology  12/12/19   Shamleffer, Konrad Dolores, MD Consulting Physician Endocrinology  12/12/19     Chief Complaint  Patient presents with   Wound Check    Right pointer finger     Subjective: Alyssa Phillips is a 67 y.o. female to follow-up on abscess of right index finger that was seen by a MyChart visit earlier this week.  Patient was started on Augmentin.  She reports area started draining puslike material and she feels the redness is becoming larger.  She is on day 2-1/2-3 of her antibiotic.  Pt presents for an OV with complaints of redness and swelling of index finger of right hand.  She reports it is mildly swollen at this location.  She noticed a small abscess or infested hair follicle and attempted to drain it.  Since then the area has become more swollen and red. She denies fever or chills.    08/31/2022    9:51 AM 07/04/2022    1:06 PM 03/24/2022   10:40 AM 03/14/2021   11:10 AM 12/11/2019    9:07 AM  Depression screen PHQ 2/9  Decreased Interest 0 0 0 0 0  Down, Depressed, Hopeless 0 0 0 0 0  PHQ - 2 Score 0 0 0 0 0  Altered sleeping     0  Tired, decreased energy     0  Change in appetite     0  Feeling bad or failure about yourself      0  Trouble concentrating     0  Moving slowly or fidgety/restless     0  Suicidal thoughts     0  PHQ-9 Score     0    Allergies  Allergen Reactions   Compazine [Prochlorperazine Edisylate] Other (See Comments)    Eyes rolled back, tongue curled up   Advil [Ibuprofen] Hives   Aspirin  Other (See Comments)    HAS to be EC aspirin!!   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 RTKA   Asthma    environmental agents induced asthma   Carpal tunnel syndrome of left wrist 02/19/2020   Carpal tunnel syndrome of right wrist 02/19/2020   Coronary artery disease    Family history of adverse reaction to anesthesia    daughter very sensitive to anesthesia   GERD (gastroesophageal reflux disease)    Heart murmur    hx. mitral valve proplapse-mostly asymptomatic-->not apprectiaed n echo   Hepatic steatosis 03/05/2008   [place]   History of kidney stones    Hyperlipidemia    Hypertension    Morbid obesity due to excess calories (HCC)    Pain in joint of left shoulder  06/12/2017   Pain in left foot 04/04/2018   Pain of joint of left ankle and foot 12/18/2019   Pneumonia    STEMI (ST elevation myocardial infarction) (HCC) 10/2017   LAD treated with DESx1   UTI (urinary tract infection)    Vitamin D deficiency    Past Surgical History:  Procedure Laterality Date   5th finger surgery Right 1990   unknown injury   CARDIAC CATHETERIZATION     CARPAL TUNNEL RELEASE  08/2020   CHOLECYSTECTOMY     COLONOSCOPY WITH PROPOFOL N/A 06/08/2016   Procedure: COLONOSCOPY WITH PROPOFOL;  Surgeon: Dorena Cookey, MD;  Location: [facility] ENDOSCOPY;  Service: Endoscopy;  Laterality: N/A;   CORONARY STENT INTERVENTION N/A 11/01/2017   Procedure: CORONARY STENT INTERVENTION;  Surgeon: Swaziland, Peter M, MD;  Location: [facility] INVASIVE CV LAB;  Service: Cardiovascular;  Laterality: N/A;  Resolute Onyx 4.5 mm x12 mm   CORONARY/GRAFT ACUTE MI REVASCULARIZATION N/A 11/01/2017   Procedure: Coronary/Graft Acute MI  Revascularization;  Surgeon: [place]azil[place], Peter M, MD;  Location: [facility] INVASIVE CV LAB;  Service: Cardiovascular;  Laterality: N/A;   CYSTOSCOPY W/ URETERAL STENT PLACEMENT Left 02/06/2022   Procedure: CYSTOSCOPY WITH RETROGRADE PYELOGRAM/URETERAL STENT PLACEMENT;  Surgeon: Despina Arias, MD;  Location: [facility] ORS;  Service: Urology;  Laterality: Left;   CYSTOSCOPY/URETEROSCOPY/HOLMIUM LASER/STENT PLACEMENT Left 02/22/2022   Procedure: LEFT URETEROSCOPY/HOLMIUM LASER/STENT EXCHANGE;  Surgeon: Despina Arias, MD;  Location: [facility] ORS;  Service: Urology;  Laterality: Left;  60 MINUTES NEEDED FOR CASE   I & D EXTREMITY Left 06/30/2021   Procedure: IRRIGATION AND DEBRIDEMENT left elbow;  Surgeon: Gomez Cleverly, MD;  Location: [facility] OR;  Service: Orthopedics;  Laterality: Left;   KNEE ARTHROSCOPY  05/02/2012   x2 right/ x1 left   LEFT HEART CATH AND CORONARY ANGIOGRAPHY N/A 11/01/2017   Procedure: LEFT HEART CATH AND CORONARY ANGIOGRAPHY;  Surgeon: [place]azil[place], Peter M, MD;  Location: [facility] INVASIVE CV LAB;  Service: Cardiovascular;  Laterality: N/A;   REPLACEMENT TOTAL KNEE Right 10/2010   ROOT CANAL     TOTAL KNEE ARTHROPLASTY  05/10/2012   Procedure: TOTAL KNEE ARTHROPLASTY;  Surgeon: Loanne Drilling, MD;  Location: [facility] ORS;  Service: Orthopedics;  Laterality: Left;   TUBAL LIGATION     UMBILICAL HERNIA REPAIR     Family History  Problem Relation Age of Onset   Hypertension Mother    Arthritis Mother    Lymphoma Mother    Asthma Mother    Cancer Mother    Hearing loss Mother    Miscarriages / [place] Mother    COPD Father    Alcohol abuse Father    Arthritis Father    Hearing loss Father    Hypertension Father    Miscarriages / [place] Sister    Arthritis Maternal Grandmother    Hearing loss Maternal Grandmother    Breast cancer Maternal Grandmother 57   Arthritis Maternal Grandfather    Diabetes Maternal Grandfather    Hypertension Maternal Grandfather    Stroke Maternal Grandfather     Arthritis Paternal Grandmother    Diabetes Paternal Grandmother    Hearing loss Paternal Grandmother    Hearing loss Paternal Grandfather    Learning disabilities Paternal Grandfather    Allergies as of 08/31/2022       Reactions   Compazine [prochlorperazine Edisylate] Other (See Comments)   Eyes rolled back, tongue curled up   Advil [ibuprofen] Hives   Aspirin Other (See Comments)   HAS to be EC  aspirin!!        Medication List        Accurate as of Aug 31, 2022 11:59 PM. If you have any questions, ask your nurse or doctor.          acetaminophen 500 MG tablet Commonly known as: TYLENOL Take 1,000 mg by mouth every 6 (six) hours as needed for moderate pain.   albuterol 108 (90 Base) MCG/ACT inhaler Commonly known as: VENTOLIN HFA Inhale 1-2 puffs into the lungs every 4 (four) hours as needed for wheezing or shortness of breath.   albuterol (2.5 MG/3ML) 0.083% nebulizer solution Commonly known as: PROVENTIL Take 3 mLs (2.5 mg total) by nebulization every 6 (six) hours as needed for wheezing or shortness of breath.   amoxicillin-clavulanate 875-125 MG tablet Commonly known as: AUGMENTIN Take 1 tablet by mouth 2 (two) times daily.   aspirin EC 81 MG tablet Take 1 tablet (81 mg total) by mouth daily.   atorvastatin 80 MG tablet Commonly known as: LIPITOR TAKE 1 TABLET BY MOUTH EVERY EVENING   azelastine 0.05 % ophthalmic solution Commonly known as: OPTIVAR Place 1 drop into both eyes daily as needed (itching).   B COMPLEX 1 PO Take 1 tablet by mouth at bedtime.   candesartan 4 MG tablet Commonly known as: ATACAND Take 1 tablet (4 mg total) by mouth daily.   cholecalciferol 1000 units tablet Commonly known as: VITAMIN D Take 1,000 Units by mouth 2 (two) times daily.   CO Q 10 PO Take 1 capsule by mouth daily.   diclofenac sodium 1 % Gel Commonly known as: VOLTAREN Apply 2-4 g topically daily as needed (knee pain).   levalbuterol 1.25 MG/3ML  nebulizer solution Commonly known as: XOPENEX Take 1.25 mg by nebulization every 8 (eight) hours as needed for wheezing or shortness of breath.   levocetirizine 5 MG tablet Commonly known as: XYZAL Take 1 tablet (5 mg total) by mouth every evening.   meclizine 25 MG tablet Commonly known as: ANTIVERT Take 25 mg by mouth 3 (three) times daily as needed for dizziness.   metoprolol succinate 50 MG 24 hr tablet Commonly known as: TOPROL-XL TAKE 1 TABLET BY MOUTH DAILY. TAKE WITH OR IMMEDIATELY FOLLOWING A MEAL.   mometasone 50 MCG/ACT nasal spray Commonly known as: NASONEX Place 2 sprays into the nose every morning.   montelukast 10 MG tablet Commonly known as: SINGULAIR Take 1 tablet (10 mg total) by mouth at bedtime.   multivitamin tablet Take 1 tablet by mouth daily.   Nebulizer Devi 1 Device by Does not apply route 3 (three) times daily as needed.   nitroGLYCERIN 0.4 MG SL tablet Commonly known as: NITROSTAT PLACE 1 TABLET UNDER THE TONGUE EVERY 5 MINUTES AS NEEDED FOR CHEST PAIN.   ondansetron 4 MG disintegrating tablet Commonly known as: ZOFRAN-ODT Take 1 tablet (4 mg total) by mouth every 8 (eight) hours as needed for nausea or vomiting.   TIGER BALM ARTHRITIS RUB EX Apply 1 application. topically at bedtime as needed (shoulder pain).   VITAMIN C PO Take 1 tablet by mouth daily.   Wixela Inhub 250-50 MCG/ACT Aepb Generic drug: fluticasone-salmeterol INHALE 1 PUFF INTO THE LUNGS IN THE MORNING AND AT BEDTIME.   ZINC PO Take 1 tablet by mouth at bedtime.        All past medical history, surgical history, allergies, family history, immunizations andmedications were updated in the EMR today and reviewed under the history and medication portions of their EMR.  ROS Negative, with the exception of above mentioned in HPI   Objective:  BP 118/77   Pulse 61   Temp (!) 97.4 F (36.3 C)   SpO2 95%  There is no height or weight on file to calculate  BMI. Physical Exam Vitals and nursing note reviewed.  Constitutional:      General: She is not in acute distress.    Appearance: Normal appearance. She is not toxic-appearing.  HENT:     Head: Normocephalic and atraumatic.  Eyes:     General: No scleral icterus.       Right eye: No discharge.        Left eye: No discharge.     Conjunctiva/sclera: Conjunctivae normal.  Pulmonary:     Effort: Pulmonary effort is normal.  Musculoskeletal:        General: Swelling and tenderness present.     Cervical back: Normal range of motion.     Comments: Right index finger: Erythema localized, swelling, abscess present.  Fluctuance present.. Normal range of motion neurovascular intact distally.  Skin:    Findings: No rash.  Neurological:     Mental Status: She is alert and oriented to person, place, and time. Mental status is at baseline.  Psychiatric:        Mood and Affect: Mood normal.        Behavior: Behavior normal.        Thought Content: Thought content normal.        Judgment: Judgment normal.     Procedure Note:  PROCEDURE NOTE: Incision and Drainage Performed by: Dr. Claiborne Billings Indication: Abscess right index finger Anesthesia was obtained with 1 cc ml of 2 % lidocaine plain. The area was prepped in the usual sterile fashion. A number 11 scalpel was used to create an incision inferior portion of abscess to allow drainage.  return was 0.5 cc of purulent fluid. Culture was  obtained.  10 cc of sterile saline rinse performed.  The site was not packed due to small size.  No suture or closure was performed. A dressing was placed over the site. The patient tolerated the procedure well. Wound care instructions were given.  Post-Procedure Diagnosis: Abscess right index finger Complications: None Estimated Blood Loss: None  Assessment/Plan: Alyssa Phillips is a 67 y.o. female present for OV for  Abscess Right index finger abscess, requiring I&D today> patient tolerated procedure  well. Continue Augmentin for now. Wound culture collected today. Follow-up 4 days.  Reviewed expectations re: course of current medical issues. Discussed self-management of symptoms. Outlined signs and symptoms indicating need for more acute intervention. Patient verbalized understanding and all questions were answered. Patient received an After-Visit Summary.    Orders Placed This Encounter  Procedures   Wound culture   No orders of the defined types were placed in this encounter.  Referral Orders  No referral(s) requested today     Note is dictated utilizing voice recognition software. Although note has been proof read prior to signing, occasional typographical errors still can be missed. If any questions arise, please do not hesitate to call for verification.   electronically signed by:  Felix Pacini, DO  [facility] [place]

## 2022-09-01 DIAGNOSIS — H1045 Other chronic allergic conjunctivitis: Secondary | ICD-10-CM | POA: Diagnosis not present

## 2022-09-01 DIAGNOSIS — J300 Vasomotor rhinitis: Secondary | ICD-10-CM | POA: Diagnosis not present

## 2022-09-01 DIAGNOSIS — J4530 Mild persistent asthma, uncomplicated: Secondary | ICD-10-CM | POA: Diagnosis not present

## 2022-09-03 ENCOUNTER — Other Ambulatory Visit: Payer: Self-pay | Admitting: Cardiology

## 2022-09-03 LAB — WOUND CULTURE
MICRO NUMBER:: 14966060
SPECIMEN QUALITY:: ADEQUATE

## 2022-09-04 ENCOUNTER — Encounter: Payer: Self-pay | Admitting: Family Medicine

## 2022-09-04 ENCOUNTER — Ambulatory Visit: Payer: BC Managed Care – PPO | Admitting: Family Medicine

## 2022-09-04 ENCOUNTER — Encounter: Payer: Self-pay | Admitting: Internal Medicine

## 2022-09-04 VITALS — BP 116/78 | HR 67 | Temp 97.6°F

## 2022-09-04 DIAGNOSIS — Z22322 Carrier or suspected carrier of Methicillin resistant Staphylococcus aureus: Secondary | ICD-10-CM

## 2022-09-04 DIAGNOSIS — L0291 Cutaneous abscess, unspecified: Secondary | ICD-10-CM | POA: Diagnosis not present

## 2022-09-04 DIAGNOSIS — E213 Hyperparathyroidism, unspecified: Secondary | ICD-10-CM

## 2022-09-04 MED ORDER — DOXYCYCLINE HYCLATE 100 MG PO TABS: 100.0000 mg | ORAL_TABLET | Freq: Two times a day (BID) | ORAL | 0 refills | Status: DC

## 2022-09-04 NOTE — Progress Notes (Signed)
Alyssa Phillips , 02/27/1956, 67 y.o., female MRN: [id number] Patient Care Team    Relationship Specialty Notifications Start End  Natalia Leatherwood, DO PCP - General Family Medicine  12/04/17   Dorena Cookey, MD (Inactive) Consulting Physician Gastroenterology  12/04/17   Ollen Gross, MD Consulting Physician Orthopedic Surgery  12/04/17   Sidney Ace, MD Referring Physician Allergy  12/12/19   Swaziland, Peter M, MD Consulting Physician Cardiology  12/12/19   Shamleffer, Konrad Dolores, MD Consulting Physician Endocrinology  12/12/19     No chief complaint on file.    Subjective: Alyssa Phillips is a 67 y.o. presents for follow up on finger abscess. I&D performed 5 days ago. Pt treated with Augmentin. Pt reports swelling and redness has improved, still mild discoloration present. No fever or chills. No pain. Wound cx: +MRSA, clindy and tetracycline oral regimens susceptible.   Prior note: Pt presents for an OV with complaints of redness and swelling of index finger of right hand.  She reports it is mildly swollen at this location.  She noticed a small abscess or infested hair follicle and attempted to drain it.  Since then the area has become more swollen and red. She denies fever or chills.    08/31/2022    9:51 AM 07/04/2022    1:06 PM 03/24/2022   10:40 AM 03/14/2021   11:10 AM 12/11/2019    9:07 AM  Depression screen PHQ 2/9  Decreased Interest 0 0 0 0 0  Down, Depressed, Hopeless 0 0 0 0 0  PHQ - 2 Score 0 0 0 0 0  Altered sleeping     0  Tired, decreased energy     0  Change in appetite     0  Feeling bad or failure about yourself      0  Trouble concentrating     0  Moving slowly or fidgety/restless     0  Suicidal thoughts     0  PHQ-9 Score     0    Allergies  Allergen Reactions   Compazine [Prochlorperazine Edisylate] Other (See Comments)    Eyes rolled back, tongue curled up   Advil [Ibuprofen] Hives   Aspirin Other (See Comments)    HAS to be EC  aspirin!!   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 RTKA   Asthma    environmental agents induced asthma   Carpal tunnel syndrome of left wrist 02/19/2020   Carpal tunnel syndrome of right wrist 02/19/2020   Coronary artery disease    Family history of adverse reaction to anesthesia    daughter very sensitive to anesthesia   GERD (gastroesophageal reflux disease)    Heart murmur    hx. mitral valve proplapse-mostly asymptomatic-->not apprectiaed n echo   Hepatic steatosis 03/05/2008   [place]   History of kidney stones    Hyperlipidemia    Hypertension    Morbid obesity due to excess calories (HCC)    Pain in joint of left shoulder 06/12/2017   Pain in left foot 04/04/2018   Pain of joint of left ankle and  foot 12/18/2019   Pneumonia    STEMI (ST elevation myocardial infarction) (HCC) 10/2017   LAD treated with DESx1   UTI (urinary tract infection)    Vitamin D deficiency    Past Surgical History:  Procedure Laterality Date   5th finger surgery Right 1990   unknown injury   CARDIAC CATHETERIZATION     CARPAL TUNNEL RELEASE  08/2020   CHOLECYSTECTOMY     COLONOSCOPY WITH PROPOFOL N/A 06/08/2016   Procedure: COLONOSCOPY WITH PROPOFOL;  Surgeon: Dorena Cookey, MD;  Location: [facility] ENDOSCOPY;  Service: Endoscopy;  Laterality: N/A;   CORONARY STENT INTERVENTION N/A 11/01/2017   Procedure: CORONARY STENT INTERVENTION;  Surgeon: Swaziland, Peter M, MD;  Location: [facility] INVASIVE CV LAB;  Service: Cardiovascular;  Laterality: N/A;  Resolute Onyx 4.5 mm x12 mm   CORONARY/GRAFT ACUTE MI REVASCULARIZATION N/A 11/01/2017   Procedure: Coronary/Graft Acute MI Revascularization;  Surgeon: Swaziland,  Peter M, MD;  Location: [facility] INVASIVE CV LAB;  Service: Cardiovascular;  Laterality: N/A;   CYSTOSCOPY W/ URETERAL STENT PLACEMENT Left 02/06/2022   Procedure: CYSTOSCOPY WITH RETROGRADE PYELOGRAM/URETERAL STENT PLACEMENT;  Surgeon: Despina Arias, MD;  Location: [facility] ORS;  Service: Urology;  Laterality: Left;   CYSTOSCOPY/URETEROSCOPY/HOLMIUM LASER/STENT PLACEMENT Left 02/22/2022   Procedure: LEFT URETEROSCOPY/HOLMIUM LASER/STENT EXCHANGE;  Surgeon: Despina Arias, MD;  Location: [facility] ORS;  Service: Urology;  Laterality: Left;  60 MINUTES NEEDED FOR CASE   I & D EXTREMITY Left 06/30/2021   Procedure: IRRIGATION AND DEBRIDEMENT left elbow;  Surgeon: Gomez Cleverly, MD;  Location: [facility] OR;  Service: Orthopedics;  Laterality: Left;   KNEE ARTHROSCOPY  05/02/2012   x2 right/ x1 left   LEFT HEART CATH AND CORONARY ANGIOGRAPHY N/A 11/01/2017   Procedure: LEFT HEART CATH AND CORONARY ANGIOGRAPHY;  Surgeon: Swaziland, Peter M, MD;  Location: [facility] INVASIVE CV LAB;  Service: Cardiovascular;  Laterality: N/A;   REPLACEMENT TOTAL KNEE Right 10/2010   ROOT CANAL     TOTAL KNEE ARTHROPLASTY  05/10/2012   Procedure: TOTAL KNEE ARTHROPLASTY;  Surgeon: Loanne Drilling, MD;  Location: [facility] ORS;  Service: Orthopedics;  Laterality: Left;   TUBAL LIGATION     UMBILICAL HERNIA REPAIR     Family History  Problem Relation Age of Onset   Hypertension Mother    Arthritis Mother    Lymphoma Mother    Asthma Mother    Cancer Mother    Hearing loss Mother    Miscarriages / [place] Mother    COPD Father    Alcohol abuse Father    Arthritis Father    Hearing loss Father    Hypertension Father    Miscarriages / [place] Sister    Arthritis Maternal Grandmother    Hearing loss Maternal Grandmother    Breast cancer Maternal Grandmother 14   Arthritis Maternal Grandfather    Diabetes Maternal Grandfather    Hypertension Maternal Grandfather    Stroke Maternal Grandfather    Arthritis Paternal Grandmother    Diabetes  Paternal Grandmother    Hearing loss Paternal Grandmother    Hearing loss Paternal Grandfather    Learning disabilities Paternal Grandfather    Allergies as of 09/04/2022       Reactions   Compazine [prochlorperazine Edisylate] Other (See Comments)   Eyes rolled back, tongue curled up   Advil [ibuprofen] Hives   Aspirin Other (See Comments)   HAS to be EC aspirin!!        Medication List  Accurate as of Sep 04, 2022  6:54 AM. If you have any questions, ask your nurse or doctor.          acetaminophen 500 MG tablet Commonly known as: TYLENOL Take 1,000 mg by mouth every 6 (six) hours as needed for moderate pain.   albuterol 108 (90 Base) MCG/ACT inhaler Commonly known as: VENTOLIN HFA Inhale 1-2 puffs into the lungs every 4 (four) hours as needed for wheezing or shortness of breath.   albuterol (2.5 MG/3ML) 0.083% nebulizer solution Commonly known as: PROVENTIL Take 3 mLs (2.5 mg total) by nebulization every 6 (six) hours as needed for wheezing or shortness of breath.   amoxicillin-clavulanate 875-125 MG tablet Commonly known as: AUGMENTIN Take 1 tablet by mouth 2 (two) times daily.   aspirin EC 81 MG tablet Take 1 tablet (81 mg total) by mouth daily.   atorvastatin 80 MG tablet Commonly known as: LIPITOR TAKE 1 TABLET BY MOUTH EVERY EVENING   azelastine 0.05 % ophthalmic solution Commonly known as: OPTIVAR Place 1 drop into both eyes daily as needed (itching).   B COMPLEX 1 PO Take 1 tablet by mouth at bedtime.   candesartan 4 MG tablet Commonly known as: ATACAND Take 1 tablet (4 mg total) by mouth daily.   cholecalciferol 1000 units tablet Commonly known as: VITAMIN D Take 1,000 Units by mouth 2 (two) times daily.   CO Q 10 PO Take 1 capsule by mouth daily.   diclofenac sodium 1 % Gel Commonly known as: VOLTAREN Apply 2-4 g topically daily as needed (knee pain).   levalbuterol 1.25 MG/3ML nebulizer solution Commonly known as:  XOPENEX Take 1.25 mg by nebulization every 8 (eight) hours as needed for wheezing or shortness of breath.   levocetirizine 5 MG tablet Commonly known as: XYZAL Take 1 tablet (5 mg total) by mouth every evening.   meclizine 25 MG tablet Commonly known as: ANTIVERT Take 25 mg by mouth 3 (three) times daily as needed for dizziness.   metoprolol succinate 50 MG 24 hr tablet Commonly known as: TOPROL-XL TAKE 1 TABLET BY MOUTH DAILY. TAKE WITH OR IMMEDIATELY FOLLOWING A MEAL.   mometasone 50 MCG/ACT nasal spray Commonly known as: NASONEX Place 2 sprays into the nose every morning.   montelukast 10 MG tablet Commonly known as: SINGULAIR Take 1 tablet (10 mg total) by mouth at bedtime.   multivitamin tablet Take 1 tablet by mouth daily.   Nebulizer Devi 1 Device by Does not apply route 3 (three) times daily as needed.   nitroGLYCERIN 0.4 MG SL tablet Commonly known as: NITROSTAT PLACE 1 TABLET UNDER THE TONGUE EVERY 5 MINUTES AS NEEDED FOR CHEST PAIN.   ondansetron 4 MG disintegrating tablet Commonly known as: ZOFRAN-ODT Take 1 tablet (4 mg total) by mouth every 8 (eight) hours as needed for nausea or vomiting.   TIGER BALM ARTHRITIS RUB EX Apply 1 application. topically at bedtime as needed (shoulder pain).   VITAMIN C PO Take 1 tablet by mouth daily.   Wixela Inhub 250-50 MCG/ACT Aepb Generic drug: fluticasone-salmeterol INHALE 1 PUFF INTO THE LUNGS IN THE MORNING AND AT BEDTIME.   ZINC PO Take 1 tablet by mouth at bedtime.        All past medical history, surgical history, allergies, family history, immunizations andmedications were updated in the EMR today and reviewed under the history and medication portions of their EMR.     ROS Negative, with the exception of above mentioned in HPI  Objective:  There were no vitals taken for this visit. There is no height or weight on file to calculate BMI. Physical Exam Vitals and nursing note reviewed.   Constitutional:      General: She is not in acute distress.    Appearance: Normal appearance. She is not toxic-appearing.  HENT:     Head: Normocephalic and atraumatic.  Eyes:     General: No scleral icterus.       Right eye: No discharge.        Left eye: No discharge.     Conjunctiva/sclera: Conjunctivae normal.  Pulmonary:     Effort: Pulmonary effort is normal.  Musculoskeletal:        General: No swelling or tenderness.     Cervical back: Normal range of motion.     Comments: Right index finger: mild erythema localized to  abscess site. No swelling, No drainage, no fluctuance. NV intact distally.   Skin:    Findings: No rash.  Neurological:     Mental Status: She is alert and oriented to person, place, and time. Mental status is at baseline.  Psychiatric:        Mood and Affect: Mood normal.        Behavior: Behavior normal.        Thought Content: Thought content normal.        Judgment: Judgment normal.      No results found. No results found. No results found for this or any previous visit (from the past 24 hour(s)).  Assessment/Plan: Alyssa Phillips is a 66 y.o. female present for OV for  Abscess/MRSA + culture Healing well s/p I& D. Mild localized redness still present Wound cx was + for MRSA, covered with add't abx >doxy BID.  F/u prn  Reviewed expectations re: course of current medical issues. Discussed self-management of symptoms. Outlined signs and symptoms indicating need for more acute intervention. Patient verbalized understanding and all questions were answered. Patient received an After-Visit Summary.    No orders of the defined types were placed in this encounter.  No orders of the defined types were placed in this encounter.  Referral Orders  No referral(s) requested today     Note is dictated utilizing voice recognition software. Although note has been proof read prior to signing, occasional typographical errors still can be missed. If  any questions arise, please do not hesitate to call for verification.   electronically signed by:  Felix Pacini, DO  [facility] - OR

## 2022-09-06 ENCOUNTER — Encounter: Payer: Self-pay | Admitting: Family Medicine

## 2022-09-07 MED ORDER — MUPIROCIN 2 % EX OINT: 1.0000 | TOPICAL_OINTMENT | Freq: Two times a day (BID) | CUTANEOUS | 0 refills | Status: DC

## 2022-10-11 ENCOUNTER — Ambulatory Visit: Payer: Self-pay | Admitting: Surgery

## 2022-10-11 ENCOUNTER — Other Ambulatory Visit: Payer: Self-pay | Admitting: Surgery

## 2022-10-11 DIAGNOSIS — E210 Primary hyperparathyroidism: Secondary | ICD-10-CM | POA: Diagnosis not present

## 2022-10-18 ENCOUNTER — Ambulatory Visit
Admission: RE | Admit: 2022-10-18 | Discharge: 2022-10-18 | Disposition: A | Discharge status: Home / Self Care | Payer: BC Managed Care – PPO | Source: Ambulatory Visit | Attending: Surgery | Admitting: Surgery

## 2022-10-18 DIAGNOSIS — E210 Primary hyperparathyroidism: Secondary | ICD-10-CM

## 2022-10-18 DIAGNOSIS — R911 Solitary pulmonary nodule: Secondary | ICD-10-CM | POA: Diagnosis not present

## 2022-10-23 ENCOUNTER — Encounter (HOSPITAL_COMMUNITY): Payer: Self-pay

## 2022-10-23 NOTE — Patient Instructions (Addendum)
SURGICAL WAITING ROOM VISITATION Patients having surgery or a procedure may have no more than 2 support people in the waiting area - these visitors may rotate.    Children under the age of 64 must have an adult with them who is not the patient.  If the patient needs to stay at the hospital during part of their recovery, the visitor guidelines for inpatient rooms apply. Pre-op nurse will coordinate an appropriate time for 1 support person to accompany patient in pre-op.  This support person may not rotate.    Please refer to the [facility] website for the visitor guidelines for Inpatients (after your surgery is over and you are in a regular room).       Your procedure is scheduled on: 11-03-22   Report to [facility] Main Entrance    Report to admitting at 5:15 AM   Call this number if you have problems the morning of surgery [phone]   Do not eat food :After Midnight.   After Midnight you may have the following liquids until 4:30 AM DAY OF SURGERY  Water Non-Citrus Juices (without pulp, NO RED-Apple, White grape, White cranberry) Black Coffee (NO MILK/CREAM OR CREAMERS, sugar ok)  Clear Tea (NO MILK/CREAM OR CREAMERS, sugar ok) regular and decaf                             Plain Jell-O (NO RED)                                           Fruit ices (not with fruit pulp, NO RED)                                     Popsicles (NO RED)                                                               Sports drinks like Gatorade (NO RED)                      If you have questions, please contact your surgeon's office.   FOLLOW  ANY ADDITIONAL PRE OP INSTRUCTIONS YOU RECEIVED FROM YOUR SURGEON'S OFFICE!!!     Oral Hygiene is also important to reduce your risk of infection.                                    Remember - BRUSH YOUR TEETH THE MORNING OF SURGERY WITH YOUR REGULAR TOOTHPASTE   Do NOT smoke after Midnight   Take these medicines the morning of surgery with A SIP  OF WATER:   Metoprolol  Meclizine if needed  Tylenol if needed  Ondansetron if needed  Okay to use inhalers, nebulizers, eyedrops and nasal spray                              You may not have any metal on your  body including hair pins, jewelry, and body piercing             Do not wear make-up, lotions, powders, perfumes or deodorant  Do not wear nail polish including gel and S&S, artificial/acrylic nails, or any other type of covering on natural nails including finger and toenails. If you have artificial nails, gel coating, etc. that needs to be removed by a nail salon please have this removed prior to surgery or surgery may need to be canceled/ delayed if the surgeon/ anesthesia feels like they are unable to be safely monitored.   Do not shave  48 hours prior to surgery.            Do not bring valuables to the hospital. [facility] IS NOT RESPONSIBLE   FOR VALUABLES.   Contacts, dentures or bridgework may not be worn into surgery.   DO NOT BRING YOUR HOME MEDICATIONS TO THE HOSPITAL. PHARMACY WILL DISPENSE MEDICATIONS LISTED ON YOUR MEDICATION LIST TO YOU DURING YOUR ADMISSION IN THE HOSPITAL!    Patients discharged on the day of surgery will not be allowed to drive home.  Someone NEEDS to stay with you for the first 24 hours after anesthesia.   Special Instructions: Bring a copy of your healthcare power of attorney and living will documents the day of surgery if you haven't scanned them before.              Please read over the following fact sheets you were given: IF YOU HAVE QUESTIONS ABOUT YOUR PRE-OP INSTRUCTIONS PLEASE CALL [phone] [place]  If you received a COVID test during your pre-op visit  it is requested that you wear a mask when out in public, stay away from anyone that may not be feeling well and notify your surgeon if you develop symptoms. If you test positive for Covid or have been in contact with anyone that has tested positive in the last 10 days please notify you  surgeon.  [facility] - Preparing for Surgery Before surgery, you can play an important role.  Because skin is not sterile, your skin needs to be as free of germs as possible.  You can reduce the number of germs on your skin by washing with CHG (chlorahexidine gluconate) soap before surgery.  CHG is an antiseptic cleaner which kills germs and bonds with the skin to continue killing germs even after washing. Please DO NOT use if you have an allergy to CHG or antibacterial soaps.  If your skin becomes reddened/irritated stop using the CHG and inform your nurse when you arrive at Short Stay. Do not shave (including legs and underarms) for at least 48 hours prior to the first CHG shower.  You may shave your face/neck.  Please follow these instructions carefully:  1.  Shower with CHG Soap the night before surgery and the  morning of surgery.  2.  If you choose to wash your hair, wash your hair first as usual with your normal  shampoo.  3.  After you shampoo, rinse your hair and body thoroughly to remove the shampoo.                             4.  Use CHG as you would any other liquid soap.  You can apply chg directly to the skin and wash.  Gently with a scrungie or clean washcloth.  5.  Apply the CHG Soap to your body ONLY FROM  THE NECK DOWN.   Do   not use on face/ open                           Wound or open sores. Avoid contact with eyes, ears mouth and   genitals (private parts).                       Wash face,  Genitals (private parts) with your normal soap.             6.  Wash thoroughly, paying special attention to the area where your    surgery  will be performed.  7.  Thoroughly rinse your body with warm water from the neck down.  8.  DO NOT shower/wash with your normal soap after using and rinsing off the CHG Soap.                9.  Pat yourself dry with a clean towel.            10.  Wear clean pajamas.            11.  Place clean sheets on your bed the night of your first shower and do  not  sleep with pets. Day of Surgery : Do not apply any lotions/deodorants the morning of surgery.  Please wear clean clothes to the hospital/surgery center.  FAILURE TO FOLLOW THESE INSTRUCTIONS MAY RESULT IN THE CANCELLATION OF YOUR SURGERY  PATIENT SIGNATURE_________________________________  NURSE SIGNATURE__________________________________  ________________________________________________________________________

## 2022-10-23 NOTE — Progress Notes (Signed)
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  Plan to proceed with parathyroidectomy as an outpatient procedure as scheduled.  Darnell Level, MD [facility] A [facility] practice Office: [phone]

## 2022-10-23 NOTE — Progress Notes (Addendum)
COVID Vaccine Completed:  Yes  Date of COVID positive in last 90 days: No  PCP - Felix Pacini, DO Cardiologist - Peter Swaziland, MD  Chest x-ray - N/A EKG - 10-27-22 Epic Stress Test - Yes ECHO - 11-01-17 Epic Cardiac Cath - 11-01-17 Epic Pacemaker/ICD device last checked: Spinal Cord Stimulator:  N/A  Bowel Prep - N/A  Sleep Study - N/A CPAP -   Fasting Blood Sugar -   N/A Checks Blood Sugar _____ times a day  Last dose of GLP1 agonist-  N/A GLP1 instructions:  N/A   Last dose of SGLT-2 inhibitors-  N/A SGLT-2 instructions: N/A   Blood Thinner Instructions:  Time Aspirin Instructions:  ASA 81.  Pt will check to see if she needs to stop Last Dose:  Activity level:  Can go up a flight of stairs and perform activities of daily living without stopping and without symptoms of chest pain or shortness of breath.  Anesthesia review: Hx of MI, MVP, murmur, HTN  Patient denies shortness of breath, fever, cough and chest pain at PAT appointment  Patient verbalized understanding of instructions that were given to them at the PAT appointment. Patient was also instructed that they will need to review over the PAT instructions again at home before surgery.

## 2022-10-27 ENCOUNTER — Encounter (HOSPITAL_COMMUNITY): Payer: Self-pay

## 2022-10-27 ENCOUNTER — Encounter (HOSPITAL_COMMUNITY): Payer: Self-pay | Admitting: Anesthesiology

## 2022-10-27 ENCOUNTER — Telehealth: Payer: Self-pay

## 2022-10-27 ENCOUNTER — Encounter (HOSPITAL_COMMUNITY): Payer: Self-pay | Admitting: Physician Assistant

## 2022-10-27 ENCOUNTER — Encounter (HOSPITAL_COMMUNITY)
Admission: RE | Admit: 2022-10-27 | Discharge: 2022-10-27 | Disposition: A | Discharge status: Home / Self Care | Payer: BC Managed Care – PPO | Source: Ambulatory Visit | Attending: Surgery | Admitting: Surgery

## 2022-10-27 ENCOUNTER — Other Ambulatory Visit: Payer: Self-pay

## 2022-10-27 VITALS — BP 150/89 | HR 52 | Temp 98.2°F | Resp 16 | Ht 69.0 in | Wt 283.4 lb

## 2022-10-27 DIAGNOSIS — J45909 Unspecified asthma, uncomplicated: Secondary | ICD-10-CM | POA: Insufficient documentation

## 2022-10-27 DIAGNOSIS — Z01818 Encounter for other preprocedural examination: Secondary | ICD-10-CM | POA: Insufficient documentation

## 2022-10-27 DIAGNOSIS — K219 Gastro-esophageal reflux disease without esophagitis: Secondary | ICD-10-CM | POA: Diagnosis not present

## 2022-10-27 DIAGNOSIS — E210 Primary hyperparathyroidism: Secondary | ICD-10-CM | POA: Insufficient documentation

## 2022-10-27 DIAGNOSIS — I2510 Atherosclerotic heart disease of native coronary artery without angina pectoris: Secondary | ICD-10-CM | POA: Diagnosis not present

## 2022-10-27 DIAGNOSIS — I10 Essential (primary) hypertension: Secondary | ICD-10-CM | POA: Diagnosis not present

## 2022-10-27 HISTORY — DX: Primary hyperparathyroidism: E21.0

## 2022-10-27 LAB — BASIC METABOLIC PANEL
Anion gap: 4 — ABNORMAL LOW (ref 5–15)
BUN: 19 mg/dL (ref 8–23)
CO2: 25 mmol/L (ref 22–32)
Calcium: 9.7 mg/dL (ref 8.9–10.3)
Chloride: 108 mmol/L (ref 98–111)
Creatinine, Ser: 0.61 mg/dL (ref 0.44–1.00)
GFR, Estimated: >60 mL/min (ref >60)
Glucose, Bld: 89 mg/dL (ref 70–99)
Potassium: 3.8 mmol/L (ref 3.5–5.1)
Sodium: 137 mmol/L (ref 135–145)

## 2022-10-27 LAB — SURGICAL PCR SCREEN
MRSA, PCR: POSITIVE — AB
Staphylococcus aureus: POSITIVE — AB

## 2022-10-27 NOTE — Telephone Encounter (Signed)
   Name: Alyssa Phillips  DOB: 03/31/1956  MRN: [id number]  Primary Cardiologist: None  Chart reviewed as part of pre-operative protocol coverage. Because of Alyssa Phillips's past medical history and time since last visit, she will require a follow-up in-office visit in order to better assess preoperative cardiovascular risk.  Pre-op covering staff: - Please schedule appointment and call patient to inform them. If patient already had an upcoming appointment within acceptable timeframe, please add "pre-op clearance" to the appointment notes so provider is aware. - Please contact requesting surgeon's office via preferred method (i.e, phone, fax) to inform them of need for appointment prior to surgery.  She has a remote history of a proximal LAD stent.  Decision for aspirin hold can be made at the time of the in person visit.  Sharlene Dory, PA-C  10/27/2022, 4:26 PM

## 2022-10-27 NOTE — Telephone Encounter (Signed)
I reviewed [facility], [facility] and [facility] schedules to see where we can get pt in, but I do not see any appts available. I have asked our scheduling team to see if they may be able to help find an appt.   I will update the surgeon's office as FYI.

## 2022-10-27 NOTE — Telephone Encounter (Signed)
   Pre-operative Risk Assessment    Patient Name: Alyssa Phillips  DOB: 1956/04/03 MRN: [id number]      Request for Surgical Clearance    Procedure:   Parathyroidectomy  Date of Surgery:  Clearance 11/03/22                                 Surgeon:  Darnell Level, MD Surgeon's Group or Practice Name:  [facility] Phone number:  [phone] Fax number:  [phone] Attn: Michel Bickers   Type of Clearance Requested:   - Medical  - Pharmacy:  Hold Aspirin will need instrcutions on when and if to hold   Type of Anesthesia:  General    Additional requests/questions:    SignedZada Finders   10/27/2022, 3:45 PM

## 2022-10-27 NOTE — Progress Notes (Signed)
PCR results sent to Dr. Gerkin to review. 

## 2022-10-27 NOTE — Progress Notes (Addendum)
Anesthesia Chart Review   Case: [id number] Date/Time: 11/03/22 0715   Procedure: RIGHT PARATHYROIDECTOMY (Right) - 89 2ND SCRUB PERSON   Anesthesia type: General   Pre-op diagnosis: PRIMARY HYPER PARATHROIDECTOMY   Location: [facility]OM 01 / [facility]   Surgeons: Darnell Level, MD       DISCUSSION:67 y.o. never smoker with h/o HTN, GERD, asthma, CAD (DES to LAD 2019), primary hyperparathyroidism scheduled for above procedure 11/03/2022  Pt last seen by cardiology 09/30/2021, 1 year follow up recommended.  Cardiac clearance requested. Spoke with triage nurse with [facility] on 10/27/2022.   Pt seen by cardiology 10/30/2022 for preoperative evaluation.  Per OV note, "- Patient presented today for preoperative evaluation prior to right parathyroidectomy on 7/19 - She reports having occasional chest pain that is associated with drinking fizzy drinks or eating certain foods- pain is relieved by burping. Rarely has chest pain if she "over does it", but pain is always relieved with rest. Chest pain has been stable for years and she has not had any increases in frequency or intensity of chest pain over the years.  - Patient is able to complete greater than 4 METS physical activity without chest pain. No ischemic evaluation needed prior to surgery  - Patient is euvolemic on exam. Does not have DOE, ankle edema, orthopnea at home  - No further cardiac evaluation needed prior to surgery  - Recommend continuing ASA perioperatively. However, patient is >12 months post PCI and does not have unstable angina, so OK to hold ASA if deemed necessary by surgeon"  An echo was ordered to reevaluate EF, echo 10/2017 with EF 40-45%.  Per notes this does not need to be completed prior to surgery.   VS: BP (!) 150/89   Pulse (!) 52   Temp 36.8 C (Oral)   Resp 16   Ht 5\' 9"  (1.753 m)   Wt 128.5 kg   SpO2 98%   BMI 41.85 kg/m   PROVIDERS: Natalia Leatherwood, DO is PCP   Cardiologist - Peter Swaziland, MD  LABS: Labs reviewed:  Acceptable for surgery. (all labs ordered are listed, but only abnormal results are displayed)  Labs Reviewed  SURGICAL PCR SCREEN - Abnormal; Notable for the following components:      Result Value   MRSA, PCR POSITIVE (*)    Staphylococcus aureus POSITIVE (*)    All other components within normal limits  BASIC METABOLIC PANEL - Abnormal; Notable for the following components:   Anion gap 4 (*)    All other components within normal limits     IMAGES:   EKG:   CV: Echo 11/01/2017 - Left ventricle: The cavity size was normal. There was mild focal    basal hypertrophy of the septum. Systolic function was mildly to    moderately reduced. The estimated ejection fraction was in the    range of 40% to 45%. Wall motion abnormalities noted below. The    study is indeterminate for the evaluation of LV diastolic    function. Acoustic contrast opacification revealed no evidence    ofthrombus.  - Regional wall motion abnormality: Akinesis of the apical anterior    and apical myocardium; hypokinesis of the mid anterior, mid    anteroseptal, apical septal, and apical lateral myocardium.  - Aortic valve: There was no regurgitation.  - Mitral valve: Structurally normal valve. No echocardiographic    evidence for prolapse. Transvalvular velocity was within the    normal range. There was no evidence for  stenosis. There was no    regurgitation.  - Tricuspid valve: There was no significant regurgitation.  - Pulmonic valve: There was no significant regurgitation.  Past Medical History:  Diagnosis Date   Allergic rhinitis    Arthritis    Osteoarthrits-knees-hx. RTKA   Asthma    environmental agents induced asthma   Carpal tunnel syndrome of left wrist 02/19/2020   Carpal tunnel syndrome of right wrist 02/19/2020   Coronary artery disease    Family history of adverse reaction to anesthesia    daughter very sensitive to anesthesia   GERD (gastroesophageal reflux disease)    Heart murmur     hx. mitral valve proplapse-mostly asymptomatic-->not apprectiaed n echo   Hepatic steatosis 03/05/2008   [place]   History of kidney stones    Hyperlipidemia    Hypertension    Morbid obesity due to excess calories (HCC)    MRSA infection 08/2022   Pain in joint of left shoulder 06/12/2017   Pain in left foot 04/04/2018   Pain of joint of left ankle and foot 12/18/2019   Pneumonia    Primary hyperparathyroidism (HCC)    STEMI (ST elevation myocardial infarction) (HCC) 10/2017   LAD treated with DESx1   UTI (urinary tract infection)    Vitamin D deficiency     Past Surgical History:  Procedure Laterality Date   5th finger surgery Right 1990   unknown injury   CARDIAC CATHETERIZATION     CARPAL TUNNEL RELEASE  08/2020   CHOLECYSTECTOMY     COLONOSCOPY WITH PROPOFOL N/A 06/08/2016   Procedure: COLONOSCOPY WITH PROPOFOL;  Surgeon: Dorena Cookey, MD;  Location: [facility] ENDOSCOPY;  Service: Endoscopy;  Laterality: N/A;   CORONARY STENT INTERVENTION N/A 11/01/2017   Procedure: CORONARY STENT INTERVENTION;  Surgeon: Swaziland, Peter M, MD;  Location: [facility] INVASIVE CV LAB;  Service: Cardiovascular;  Laterality: N/A;  Resolute Onyx 4.5 mm x12 mm   CORONARY/GRAFT ACUTE MI REVASCULARIZATION N/A 11/01/2017   Procedure: Coronary/Graft Acute MI Revascularization;  Surgeon: Swaziland, Peter M, MD;  Location: [facility] INVASIVE CV LAB;  Service: Cardiovascular;  Laterality: N/A;   CYSTOSCOPY W/ URETERAL STENT PLACEMENT Left 02/06/2022   Procedure: CYSTOSCOPY WITH RETROGRADE PYELOGRAM/URETERAL STENT PLACEMENT;  Surgeon: Despina Arias, MD;  Location[facility];  Service: Urology;  Laterality: Left;   CYSTOSCOPY/URETEROSCOPY/HOLMIUM LASER/STENT PLACEMENT Left 02/22/2022   Procedure: LEFT URETEROSCOPY/HOLMIUM LASER/STENT EXCHANGE;  Surgeon: Despina Arias, MD;  Location: [facility];  Service: Urology;  Laterality: Left;  60 MINUTES NEEDED FOR CASE   I & D EXTREMITY Left 06/30/2021   Procedure: IRRIGATION AND DEBRIDEMENT left  elbow;  Surgeon: Gomez Cleverly, MD;  Location: [facility] OR;  Service: Orthopedics;  Laterality: Left;   KNEE ARTHROSCOPY  05/02/2012   x2 right/ x1 left   LEFT HEART CATH AND CORONARY ANGIOGRAPHY N/A 11/01/2017   Procedure: LEFT HEART CATH AND CORONARY ANGIOGRAPHY;  Surgeon: Swaziland, Peter M, MD;  Location: [facility] INVASIVE CV LAB;  Service: Cardiovascular;  Laterality: N/A;   REPLACEMENT TOTAL KNEE Right 10/2010   ROOT CANAL     TOTAL KNEE ARTHROPLASTY  05/10/2012   Procedure: TOTAL KNEE ARTHROPLASTY;  Surgeon: Loanne Drilling, MD;  Location[facility];  Service: Orthopedics;  Laterality: Left;   TUBAL LIGATION     UMBILICAL HERNIA REPAIR      MEDICATIONS:  acetaminophen (TYLENOL) 500 MG tablet   albuterol (PROVENTIL) (2.5 MG/3ML) 0.083% nebulizer solution   albuterol (VENTOLIN HFA) 108 (90 Base) MCG/ACT inhaler   Ascorbic Acid (  VITAMIN C PO)   aspirin EC 81 MG EC tablet   atorvastatin (LIPITOR) 80 MG tablet   azelastine (OPTIVAR) 0.05 % ophthalmic solution   B Complex Vitamins (B COMPLEX 1 PO)   budesonide-formoterol (SYMBICORT) 160-4.5 MCG/ACT inhaler   candesartan (ATACAND) 4 MG tablet   cholecalciferol (VITAMIN D) 1000 units tablet   Coenzyme Q10 (CO Q 10 PO)   diclofenac sodium (VOLTAREN) 1 % GEL   doxycycline (VIBRA-TABS) 100 MG tablet   levalbuterol (XOPENEX) 1.25 MG/3ML nebulizer solution   levocetirizine (XYZAL) 5 MG tablet   meclizine (ANTIVERT) 25 MG tablet   Menthol-Camphor (TIGER BALM ARTHRITIS RUB EX)   metoprolol succinate (TOPROL-XL) 50 MG 24 hr tablet   mometasone (NASONEX) 50 MCG/ACT nasal spray   montelukast (SINGULAIR) 10 MG tablet   Multiple Vitamins-Minerals (ZINC PO)   mupirocin ointment (BACTROBAN) 2 %   nitroGLYCERIN (NITROSTAT) 0.4 MG SL tablet   ondansetron (ZOFRAN-ODT) 4 MG disintegrating tablet   Respiratory Therapy Supplies (NEBULIZER) DEVI   WIXELA INHUB 250-50 MCG/ACT AEPB   No current facility-administered medications for this encounter.    Jodell Cipro Ward, PA-C [facility] [phone]

## 2022-10-29 ENCOUNTER — Encounter (HOSPITAL_COMMUNITY): Payer: Self-pay | Admitting: Surgery

## 2022-10-29 NOTE — H&P (Signed)
REFERRING PHYSICIAN: Shamleffer, Ibtehal  PROVIDER: Andrue Dini Myra Rude, MD   Chief Complaint: New Consultation (Primary hyperparathyroidism)  History of Present Illness:  Patient is referred by Dr. Lesly Dukes for surgical evaluation and management of primary hyperparathyroidism. Patient was noted with hypercalcemia in 2021. Laboratory studies have shown calcium levels as high as 10.8 and intact PTH levels as high as 91. Patient underwent a 24-hour urine collection for calcium which was elevated at 317. Vitamin D level was normal at 43. In May 2024 the patient underwent a nuclear medicine parathyroid scan with sestamibi. This demonstrated focal activity posterior to the right thyroid lobe consistent with parathyroid adenoma. Patient is now referred to surgery for consideration for parathyroidectomy. Patient denies significant fatigue. She does note some bone and joint discomfort. She has a history of nephrolithiasis. Bone density scan has demonstrated osteopenia. Patient has had no prior head or neck surgery. There is no family history of parathyroid disease or other endocrine neoplasm. Patient runs a childcare center. Her son is a Engineer, petroleum in [place].  Review of Systems: A complete review of systems was obtained from the patient. I have reviewed this information and discussed as appropriate with the patient. See HPI as well for other ROS.  Review of Systems  Constitutional: Negative.  HENT: Negative.  Eyes: Negative.  Respiratory: Negative.  Cardiovascular: Negative.  Gastrointestinal: Negative.  Genitourinary: Positive for frequency.  Musculoskeletal: Positive for joint pain.  Skin: Negative.  Neurological: Negative.  Endo/Heme/Allergies: Negative.  Psychiatric/Behavioral: The patient has insomnia.    Medical History: Past Medical History:  Diagnosis Date  Arthritis  Asthma, unspecified asthma severity, unspecified whether complicated, unspecified whether  persistent (HHS-HCC)  Hyperlipidemia  Hypertension  Thyroid disease   Patient Active Problem List  Diagnosis  Primary hyperparathyroidism (CMS/HHS-HCC)   Past Surgical History:  Procedure Laterality Date  ENDOSCOPIC CARPAL TUNNEL RELEASE Left  ESSURE TUBAL LIGATION  HERNIA REPAIR  JOINT REPLACEMENT    Allergies  Allergen Reactions  Ibuprofen Hives  Prochlorperazine Other (See Comments)  Eyes rolled back, tongue curled up   Current Outpatient Medications on File Prior to Visit  Medication Sig Dispense Refill  albuterol (PROVENTIL) 2.5 mg /3 mL (0.083 %) nebulizer solution INHALE 3 ML BY NEBULIZATION EVERY 6 HOURS AS NEEDED FOR WHEEZING OR SHORTNESS OF BREATH  atorvastatin (LIPITOR) 80 MG tablet Take 1 tablet by mouth every evening  azelastine (OPTIVAR) 0.05 % ophthalmic solution INSTILL 1 DROP INTO AFFECTED EYE TWICE A DAY FOR 30 DAYS  candesartan (ATACAND) 4 MG tablet Take 1 tablet by mouth once daily  cholecalciferol (VITAMIN D3) 1000 unit tablet Take 1,000 Units by mouth  diclofenac (VOLTAREN) 1 % topical gel diclofenac 1 % topical gel APPLY 2 GRAMS TO AFFECTED AREA(S) 4 TIMES A DAY  levalbuterol (XOPENEX) 0.63 mg/3 mL nebulizer solution  levocetirizine (XYZAL) 5 MG tablet Take 1 tablet by mouth every evening  metoprolol succinate (TOPROL-XL) 50 MG XL tablet TAKE 1 TABLET BY MOUTH DAILY. TAKE WITH OR IMMEDIATELY FOLLOWING A MEAL.  mometasone (NASONEX) 50 mcg/actuation nasal spray PLACE 2 SPRAYS INTO THE NOSE EVERY MORNING.  montelukast (SINGULAIR) 10 mg tablet Take 1 tablet by mouth at bedtime  nitroGLYcerin (NITROSTAT) 0.4 MG SL tablet Place 1 tablet under the tongue every 5 (five) minutes as needed  acetaminophen (TYLENOL) 325 mg Cap  budesonide-formoteroL (SYMBICORT) 160-4.5 mcg/actuation inhaler Inhale into the lungs   No current facility-administered medications on file prior to visit.   Family History  Problem Relation Age  of Onset  Stroke Mother  High blood  pressure (Hypertension) Mother  Hyperlipidemia (Elevated cholesterol) Mother  Hyperlipidemia (Elevated cholesterol) Father  Skin cancer Sister  High blood pressure (Hypertension) Sister  Hyperlipidemia (Elevated cholesterol) Sister    Social History   Tobacco Use  Smoking Status Never  Smokeless Tobacco Never    Social History   Socioeconomic History  Marital status: Married  Tobacco Use  Smoking status: Never  Smokeless tobacco: Never  Vaping Use  Vaping status: Never Used  Substance and Sexual Activity  Alcohol use: Never  Drug use: Never   Social Determinants of Health   Financial Resource Strain: Low Risk (01/01/2018)  Received from [facility]  Overall Financial Resource Strain (CARDIA)  Difficulty of Paying Living Expenses: Not hard at all  Food Insecurity: No Food Insecurity (01/01/2018)  Received from [facility]  Hunger Vital Sign  Worried About Running Out of Food in the Last Year: Never true  Ran Out of Food in the Last Year: Never true  Transportation Needs: No Transportation Needs (01/01/2018)  Received from [facility] - [facility]  Lack of Transportation (Medical): No  Lack of Transportation (Non-Medical): No  Physical Activity: Inactive (01/01/2018)  Received from [facility]  Exercise Vital Sign  Days of Exercise per Week: 0 days  Minutes of Exercise per Session: 0 min  Stress: No Stress Concern Present (01/01/2018)  Received from [facility] - Occupational Stress Questionnaire  Feeling of Stress : Only a little   Objective:   Vitals:  PainSc: 0-No pain   There is no height or weight on file to calculate BMI.  Physical Exam   GENERAL APPEARANCE Comfortable, no acute issues Development: normal Gross deformities: none  SKIN Rash, lesions, ulcers: none Induration, erythema: none Nodules: none palpable  EYES Conjunctiva and lids: normal Pupils: equal and reactive  EARS, NOSE,  MOUTH, THROAT External ears: no lesion or deformity External nose: no lesion or deformity Hearing: grossly normal  NECK Symmetric: yes Trachea: midline Thyroid: no palpable nodules in the thyroid bed  ABDOMEN Not assessed  GENITOURINARY/RECTAL Not assessed  MUSCULOSKELETAL Station and gait: normal Digits and nails: no clubbing or cyanosis Muscle strength: grossly normal all extremities Range of motion: grossly normal all extremities Deformity: none  LYMPHATIC Cervical: none palpable Supraclavicular: none palpable  PSYCHIATRIC Oriented to person, place, and time: yes Mood and affect: normal for situation Judgment and insight: appropriate for situation   Assessment and Plan:   Primary hyperparathyroidism (CMS/HHS-HCC)  Patient is referred by Dr. Lesly Dukes for surgical evaluation and management of primary hyperparathyroidism.  Patient provided with a copy of "Parathyroid Surgery: Treatment for Your Parathyroid Gland Problem", published by [employer], 12 pages. Book reviewed and explained to patient during visit today.  Today we reviewed her clinical history. We reviewed her laboratory studies and her nuclear medicine parathyroid scan with sestamibi. We discussed minimally invasive parathyroidectomy. We discussed the size and location of the surgical incision. We discussed potential complications such as recurrent laryngeal nerve injury. We discussed doing this as an outpatient surgical procedure. We discussed her postoperative recovery. The patient understands and wishes to proceed with surgery in the near future.  We will obtain an ultrasound examination of the neck to evaluate the thyroid and possibly confirm the location of the parathyroid adenoma preoperatively.   Darnell Level, MD [facility] practice Office: [phone]

## 2022-10-29 NOTE — H&P (View-Only) (Signed)
REFERRING PHYSICIAN: Shamleffer, Ibtehal  PROVIDER: TODD Myra Rude, MD   Chief Complaint: New Consultation (Primary hyperparathyroidism)  History of Present Illness:  Patient is referred by Dr. Lesly Dukes for surgical evaluation and management of primary hyperparathyroidism. Patient was noted with hypercalcemia in 2021. Laboratory studies have shown calcium levels as high as 10.8 and intact PTH levels as high as 91. Patient underwent a 24-hour urine collection for calcium which was elevated at 317. Vitamin D level was normal at 43. In May 2024 the patient underwent a nuclear medicine parathyroid scan with sestamibi. This demonstrated focal activity posterior to the right thyroid lobe consistent with parathyroid adenoma. Patient is now referred to surgery for consideration for parathyroidectomy. Patient denies significant fatigue. She does note some bone and joint discomfort. She has a history of nephrolithiasis. Bone density scan has demonstrated osteopenia. Patient has had no prior head or neck surgery. There is no family history of parathyroid disease or other endocrine neoplasm. Patient runs a childcare center. Her son is a Engineer, petroleum in [place].  Review of Systems: A complete review of systems was obtained from the patient. I have reviewed this information and discussed as appropriate with the patient. See HPI as well for other ROS.  Review of Systems  Constitutional: Negative.  HENT: Negative.  Eyes: Negative.  Respiratory: Negative.  Cardiovascular: Negative.  Gastrointestinal: Negative.  Genitourinary: Positive for frequency.  Musculoskeletal: Positive for joint pain.  Skin: Negative.  Neurological: Negative.  Endo/Heme/Allergies: Negative.  Psychiatric/Behavioral: The patient has insomnia.    Medical History: Past Medical History:  Diagnosis Date  Arthritis  Asthma, unspecified asthma severity, unspecified whether complicated, unspecified whether  persistent (HHS-HCC)  Hyperlipidemia  Hypertension  Thyroid disease   Patient Active Problem List  Diagnosis  Primary hyperparathyroidism (CMS/HHS-HCC)   Past Surgical History:  Procedure Laterality Date  ENDOSCOPIC CARPAL TUNNEL RELEASE Left  ESSURE TUBAL LIGATION  HERNIA REPAIR  JOINT REPLACEMENT    Allergies  Allergen Reactions  Ibuprofen Hives  Prochlorperazine Other (See Comments)  Eyes rolled back, tongue curled up   Current Outpatient Medications on File Prior to Visit  Medication Sig Dispense Refill  albuterol (PROVENTIL) 2.5 mg /3 mL (0.083 %) nebulizer solution INHALE 3 ML BY NEBULIZATION EVERY 6 HOURS AS NEEDED FOR WHEEZING OR SHORTNESS OF BREATH  atorvastatin (LIPITOR) 80 MG tablet Take 1 tablet by mouth every evening  azelastine (OPTIVAR) 0.05 % ophthalmic solution INSTILL 1 DROP INTO AFFECTED EYE TWICE A DAY FOR 30 DAYS  candesartan (ATACAND) 4 MG tablet Take 1 tablet by mouth once daily  cholecalciferol (VITAMIN D3) 1000 unit tablet Take 1,000 Units by mouth  diclofenac (VOLTAREN) 1 % topical gel diclofenac 1 % topical gel APPLY 2 GRAMS TO AFFECTED AREA(S) 4 TIMES A DAY  levalbuterol (XOPENEX) 0.63 mg/3 mL nebulizer solution  levocetirizine (XYZAL) 5 MG tablet Take 1 tablet by mouth every evening  metoprolol succinate (TOPROL-XL) 50 MG XL tablet TAKE 1 TABLET BY MOUTH DAILY. TAKE WITH OR IMMEDIATELY FOLLOWING A MEAL.  mometasone (NASONEX) 50 mcg/actuation nasal spray PLACE 2 SPRAYS INTO THE NOSE EVERY MORNING.  montelukast (SINGULAIR) 10 mg tablet Take 1 tablet by mouth at bedtime  nitroGLYcerin (NITROSTAT) 0.4 MG SL tablet Place 1 tablet under the tongue every 5 (five) minutes as needed  acetaminophen (TYLENOL) 325 mg Cap  budesonide-formoteroL (SYMBICORT) 160-4.5 mcg/actuation inhaler Inhale into the lungs   No current facility-administered medications on file prior to visit.   Family History  Problem Relation Age  of Onset  Stroke Mother  High blood  pressure (Hypertension) Mother  Hyperlipidemia (Elevated cholesterol) Mother  Hyperlipidemia (Elevated cholesterol) Father  Skin cancer Sister  High blood pressure (Hypertension) Sister  Hyperlipidemia (Elevated cholesterol) Sister    Social History   Tobacco Use  Smoking Status Never  Smokeless Tobacco Never    Social History   Socioeconomic History  Marital status: Married  Tobacco Use  Smoking status: Never  Smokeless tobacco: Never  Vaping Use  Vaping status: Never Used  Substance and Sexual Activity  Alcohol use: Never  Drug use: Never   Social Determinants of Health   Financial Resource Strain: Low Risk (01/01/2018)  Received from [facility]  Overall Financial Resource Strain (CARDIA)  Difficulty of Paying Living Expenses: Not hard at all  Food Insecurity: No Food Insecurity (01/01/2018)  Received from [facility]  Hunger Vital Sign  Worried About Running Out of Food in the Last Year: Never true  Ran Out of Food in the Last Year: Never true  Transportation Needs: No Transportation Needs (01/01/2018)  Received from [facility] - Transportation  Lack of Transportation (Medical): No  Lack of Transportation (Non-Medical): No  Physical Activity: Inactive (01/01/2018)  Received from [facility]  Exercise Vital Sign  Days of Exercise per Week: 0 days  Minutes of Exercise per Session: 0 min  Stress: No Stress Concern Present (01/01/2018)  Received from [facility] - Occupational Stress Questionnaire  Feeling of Stress : Only a little   Objective:   Vitals:  PainSc: 0-No pain   There is no height or weight on file to calculate BMI.  Physical Exam   GENERAL APPEARANCE Comfortable, no acute issues Development: normal Gross deformities: none  SKIN Rash, lesions, ulcers: none Induration, erythema: none Nodules: none palpable  EYES Conjunctiva and lids: normal Pupils: equal and reactive  EARS, NOSE,  MOUTH, THROAT External ears: no lesion or deformity External nose: no lesion or deformity Hearing: grossly normal  NECK Symmetric: yes Trachea: midline Thyroid: no palpable nodules in the thyroid bed  ABDOMEN Not assessed  GENITOURINARY/RECTAL Not assessed  MUSCULOSKELETAL Station and gait: normal Digits and nails: no clubbing or cyanosis Muscle strength: grossly normal all extremities Range of motion: grossly normal all extremities Deformity: none  LYMPHATIC Cervical: none palpable Supraclavicular: none palpable  PSYCHIATRIC Oriented to person, place, and time: yes Mood and affect: normal for situation Judgment and insight: appropriate for situation   Assessment and Plan:   Primary hyperparathyroidism (CMS/HHS-HCC)  Patient is referred by Dr. Lesly Dukes for surgical evaluation and management of primary hyperparathyroidism.  Patient provided with a copy of "Parathyroid Surgery: Treatment for Your Parathyroid Gland Problem", published by [employer], 12 pages. Book reviewed and explained to patient during visit today.  Today we reviewed her clinical history. We reviewed her laboratory studies and her nuclear medicine parathyroid scan with sestamibi. We discussed minimally invasive parathyroidectomy. We discussed the size and location of the surgical incision. We discussed potential complications such as recurrent laryngeal nerve injury. We discussed doing this as an outpatient surgical procedure. We discussed her postoperative recovery. The patient understands and wishes to proceed with surgery in the near future.  We will obtain an ultrasound examination of the neck to evaluate the thyroid and possibly confirm the location of the parathyroid adenoma preoperatively.   Darnell Level, MD [facility] practice Office: [phone]

## 2022-10-30 ENCOUNTER — Ambulatory Visit: Payer: BC Managed Care – PPO | Attending: Cardiology | Admitting: Cardiology

## 2022-10-30 ENCOUNTER — Encounter: Payer: Self-pay | Admitting: Cardiology

## 2022-10-30 VITALS — BP 128/88 | HR 61 | Ht 69.0 in | Wt 286.2 lb

## 2022-10-30 DIAGNOSIS — I10 Essential (primary) hypertension: Secondary | ICD-10-CM | POA: Diagnosis not present

## 2022-10-30 DIAGNOSIS — I2510 Atherosclerotic heart disease of native coronary artery without angina pectoris: Secondary | ICD-10-CM | POA: Diagnosis not present

## 2022-10-30 DIAGNOSIS — I255 Ischemic cardiomyopathy: Secondary | ICD-10-CM | POA: Diagnosis not present

## 2022-10-30 DIAGNOSIS — Z01818 Encounter for other preprocedural examination: Secondary | ICD-10-CM | POA: Diagnosis not present

## 2022-10-30 DIAGNOSIS — E782 Mixed hyperlipidemia: Secondary | ICD-10-CM

## 2022-10-30 NOTE — Telephone Encounter (Signed)
Patient scheduled to see Robet Leu PA-C today (10/30/22) for preop clearance

## 2022-10-30 NOTE — Patient Instructions (Signed)
Medication Instructions:  The current medical regimen is effective;  continue present plan and medications as directed. Please refer to the Current Medication list given to you today.  *If you need a refill on your cardiac medications before your next appointment, please call your pharmacy*  Lab Work: NONE If you have labs (blood work) drawn today and your tests are completely normal, you will receive your results only by: MyChart Message (if you have MyChart) OR A paper copy in the mail If you have any lab test that is abnormal or we need to change your treatment, we will call you to review the results.  Testing/Procedures: Your physician has requested that you have an echocardiogram. Echocardiography is a painless test that uses sound waves to create images of your heart. It provides your doctor with information about the size and shape of your heart and how well your heart's chambers and valves are working. This procedure takes approximately one hour. There are no restrictions for this procedure. Please do NOT wear cologne, perfume, aftershave, or lotions (deodorant is allowed). Please arrive 15 minutes prior to your appointment time.   Follow-Up: At [facility], you and your health needs are our priority.  As part of our continuing mission to provide you with exceptional heart care, we have created designated Provider Care Teams.  These Care Teams include your primary Cardiologist (physician) and Advanced Practice Providers (APPs -  Physician Assistants and Nurse Practitioners) who all work together to provide you with the care you need, when you need it.  We recommend signing up for the patient portal called "MyChart".  Sign up information is provided on this After Visit Summary.  MyChart is used to connect with patients for Virtual Visits (Telemedicine).  Patients are able to view lab/test results, encounter notes, upcoming appointments, etc.  Non-urgent messages can be sent to  your provider as well.   To learn more about what you can do with MyChart, go to [email or web address].    Your next appointment:   KEEP SCHEDULED APPOINTMENT   Provider:   Peter Swaziland, MD     Other Instructions

## 2022-10-30 NOTE — Progress Notes (Addendum)
Cardiology Office Note:  .   Date:  10/30/2022  ID:  Alyssa Phillips, DOB 09-Oct-1955, MRN [id number] PCP: Alyssa Leatherwood, DO  [facility] Providers Cardiologist:  Alyssa Swaziland, MD    History of Present Illness: Alyssa Phillips Kitchen   Alyssa Phillips is a 66 y.o. female with a past medical history of HTN, HLD, asthma, reported mitral valve prolapse, CAD. Patient is followed by Dr. Swaziland and presents today for preoperative risk evaluation   Per chart review, patient was admitted with an anterior STEMI in 10/2017. He was taken to the cath lab and cath showed severe single-vessel disease with 95% stenosis in proximal LAD. Treated with DESx1. She underwent echocardiogram on 11/01/17 that showed EF 40-45% with regional wall motion abnormalities, no mitral valve prolapse. Patient was started on ASA, Brilinta, and high dose statin. She was also started on BB, ARB. Brilinta was discontinued at 1 year. She was last seen by cardiology on 09/30/21. At that time, she was doing well. Had rare chest pain when she "overdoes it". Was walking 8-10k steps per day.   Patient presents today for preoperative evaluation prior to right parathyroidectomy on 7/19. She reports that she has been doing very well from a cardiac perspective. She occasionally has chest pain that is associated with eating certain foods or drinking carbonated beverages. Pain is relieved by burping. She occasionally has exertional chest pain, but only if she "overdoes it". In these cases, chest pain always goes away with rest. Reports that she is able to walk up and down stairs, do housework, and work at her job running a daycare facility without chest pain. Reports that her symptoms have been stable for years.   ROS: Denies shortness of breat, orthopnea, syncope, near syncope. Does have vertigo   Studies Reviewed: .    Cardiac Studies & Procedures   CARDIAC CATHETERIZATION  CARDIAC CATHETERIZATION 11/01/2017  Narrative  Ost 1st Mrg lesion is 40%  stenosed.  Prox LAD lesion is 95% stenosed.  Post intervention, there is a 0% residual stenosis.  A drug-eluting stent was successfully placed using a STENT RESOLUTE ONYX 4.5X12.  There is mild left ventricular systolic dysfunction.  LV end diastolic pressure is mildly elevated.  The left ventricular ejection fraction is 50-55% by visual estimate.  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 Mildly impaired LV function. EF 45-50% 3. Mildly elevated LVEDP 4. Successful PCI of the proximal LAD with DES x 1   Recommend uninterrupted dual antiplatelet therapy with Aspirin 81mg  daily and Ticagrelor 90mg  twice daily for a minimum of 12 months (ACS - Class I recommendation).  Findings Coronary Findings Diagnostic  Dominance: Right  Left Main Vessel was injected. Vessel is normal in caliber. Vessel is angiographically normal.  Left Anterior Descending Vessel was injected. Vessel is large. Prox LAD lesion is 95% stenosed. The lesion is focal.  Left Circumflex  First Obtuse Marginal Branch Ost 1st Mrg lesion is 40% stenosed.  Right Coronary Artery Vessel was injected. Vessel is large. Vessel is angiographically normal.  Intervention  Prox LAD lesion Stent CATH VISTA GUIDE 6FR XBLAD3.5 guide catheter was inserted. Lesion crossed with guidewire using a WIRE ASAHI PROWATER 180CM. Pre-stent angioplasty was performed using a BALLOON SAPPHIRE 2.5X12. A drug-eluting stent was successfully placed using a STENT RESOLUTE ONYX 4.5X12. Stent strut is well apposed. Post-stent angioplasty was performed using a BALLOON [place] EUPHORA RX4.5X8. Post-Intervention Lesion Assessment The intervention was successful. Pre-interventional TIMI flow is 3. Post-intervention TIMI flow is 3. No  complications occurred at this lesion. There is a 0% residual stenosis post intervention.     ECHOCARDIOGRAM  ECHOCARDIOGRAM COMPLETE 11/01/2017  Narrative *[place]* *[facility]* [address] [place], [place] [address] [phone]  ------------------------------------------------------------------- Transthoracic Echocardiography  Patient:    Alyssa Phillips MR #:       [id number] Study Date: 11/01/2017 Gender:     F Age:        78 Height:     177.8 cm Weight:     154.2 kg BSA:        2.84 m^2 Pt. Status: Room:       [facility]  ADMITTING    Alyssa Phillips, M.D. ORDERING     Alyssa Phillips, M.D. REFERRING    Alyssa Phillips, M.D. PERFORMING   [facility], Inpatient ATTENDING    Cardama, Amadeo Garnet SONOGRAPHER  Dance, Tiffany SONOGRAPHER  Celene Skeen, RDCS  cc:  ------------------------------------------------------------------- LV EF: 40% -   45%  ------------------------------------------------------------------- History:   PMH:  Dyspnea Evaluation.  Murmur.  Risk factors: mitral valve prolapse. Hypertension.  ------------------------------------------------------------------- Study Conclusions  - Left ventricle: The cavity size was normal. There was mild focal basal hypertrophy of the septum. Systolic function was mildly to moderately reduced. The estimated ejection fraction was in the range of 40% to 45%. Wall motion abnormalities noted below. The study is indeterminate for the evaluation of LV diastolic function. Acoustic contrast opacification revealed no evidence ofthrombus. - Regional wall motion abnormality: Akinesis of the apical anterior and apical myocardium; hypokinesis of the mid anterior, mid anteroseptal, apical septal, and apical lateral myocardium. - Aortic valve: There was no regurgitation. - Mitral valve: Structurally normal valve. No echocardiographic evidence for prolapse. Transvalvular velocity was within the normal range. There was no evidence for stenosis. There was no regurgitation. - Tricuspid valve: There was no significant regurgitation. - Pulmonic valve: There was no significant  regurgitation.  Impressions:  - -LV EF mild to moderately reduced with focal wall motion abnormalities. Hypokinesis of anterior and anteroseptal walls and apex. Echo contrast used to better evaluate wall motion and exclude LV thrombus. -No evidence of mitral valve prolapse or regurgitation. -Technically difficult study. Right sided structures not well visualized but appear grossly normal.  ------------------------------------------------------------------- Study data:  No prior study was available for comparison.  Study status:  Routine.  Procedure:  The patient reported no pain pre or post test. Transthoracic echocardiography. Image quality was adequate. The study was technically difficult, as a result of restricted patient mobility and body habitus. Intravenous contrast (Definity) was administered.  Study completion:  There were no complications.          Transthoracic echocardiography.  M-mode, complete 2D, spectral Doppler, and color Doppler.  Birthdate: Patient birthdate: Jan 18, 1956.  Age:  Patient is 67 yr old.  Sex: Gender: female.    BMI: 48.8 kg/m^2.  Blood pressure:     130/80 Patient status:  Inpatient.  Study date:  Study date: 11/01/2017. Study time: 02:00 PM.  Location:  ICU/CCU  -------------------------------------------------------------------  ------------------------------------------------------------------- Left ventricle:  The cavity size was normal. There was mild focal basal hypertrophy of the septum. Systolic function was mildly to moderately reduced. The estimated ejection fraction was in the range of 40% to 45%. Wall motion abnormalities noted below. Acoustic contrast opacification revealed no evidence ofthrombus. Regional wall motion abnormalities:  Akinesis of the apical anterior and apical myocardium; hypokinesis of the mid anterior, mid anteroseptal, apical septal, and apical lateral myocardium. The study is indeterminate for the  evaluation of LV  diastolic function.  ------------------------------------------------------------------- Aortic valve:   Trileaflet; normal thickness leaflets. Mobility was not restricted.  Doppler:  Transvalvular velocity was within the normal range. There was no stenosis. There was no regurgitation.  ------------------------------------------------------------------- Aorta:  Aortic root: The aortic root was normal in size.  ------------------------------------------------------------------- Mitral valve:   Structurally normal valve.   Mobility was not restricted. No echocardiographic evidence for prolapse.  Doppler: Transvalvular velocity was within the normal range. There was no evidence for stenosis. There was no regurgitation.    Peak gradient (D): 4 mm Hg.  ------------------------------------------------------------------- Left atrium:  The atrium was normal in size.  ------------------------------------------------------------------- Atrial septum:  Poorly visualized.  ------------------------------------------------------------------- Right ventricle:  Poorly visualized. The cavity size was normal. Wall thickness was normal.  ------------------------------------------------------------------- Pulmonic valve:   Poorly visualized.  The valve appears to be grossly normal.    Doppler:  Transvalvular velocity was within the normal range. There was no evidence for stenosis. There was no significant regurgitation.  ------------------------------------------------------------------- Tricuspid valve:  Poorly visualized.  The valve appears to be grossly normal.    Doppler:  Transvalvular velocity was within the normal range. There was no significant regurgitation.  ------------------------------------------------------------------- Pulmonary artery:   The main pulmonary artery was normal-sized. Systolic pressure was within the normal  range.  ------------------------------------------------------------------- Right atrium:  Poorly visualized. The atrium was normal in size.  ------------------------------------------------------------------- Pericardium:  There was no pericardial effusion.  ------------------------------------------------------------------- Systemic veins: Inferior vena cava: The vessel was dilated. The respirophasic diameter changes were blunted (< 50%), consistent with elevated central venous pressure.  ------------------------------------------------------------------- Measurements  Left ventricle                           Value        Reference LV ID, ED, PLAX chordal                  49.5  mm     43 - 52 LV ID, ES, PLAX chordal                  29.8  mm     23 - 38 LV fx shortening, PLAX chordal           40    %      >=29 LV PW thickness, ED                      12.4  mm     --------- IVS/LV PW ratio, ED                      1.05         <=1.3 Stroke volume, 2D                        48    ml     --------- Stroke volume/bsa, 2D                    17    ml/m^2 --------- LV e&', lateral                           8.49  cm/s   --------- LV E/e&', lateral                         11.14        ---------  LV e&', medial                            5.98  cm/s   --------- LV E/e&', medial                          15.82        --------- LV e&', average                           7.24  cm/s   --------- LV E/e&', average                         13.08        ---------  Ventricular septum                       Value        Reference IVS thickness, ED                        13    mm     ---------  LVOT                                     Value        Reference LVOT ID, S                               20    mm     --------- LVOT area                                3.14  cm^2   --------- LVOT peak velocity, S                    79.1  cm/s   --------- LVOT mean velocity, S                    55.8  cm/s    --------- LVOT VTI, S                              15.3  cm     --------- LVOT peak gradient, S                    3     mm Hg  ---------  Aorta                                    Value        Reference Aortic root ID, ED                       29    mm     ---------  Left atrium                              Value        Reference LA ID, A-P,  ES                           43    mm     --------- LA ID/bsa, A-P                           1.51  cm/m^2 <=2.2 LA volume, S                             41.3  ml     --------- LA volume/bsa, S                         14.5  ml/m^2 --------- LA volume, ES, 1-p A4C                   28.8  ml     --------- LA volume/bsa, ES, 1-p A4C               10.1  ml/m^2 --------- LA volume, ES, 1-p A2C                   56.1  ml     --------- LA volume/bsa, ES, 1-p A2C               19.7  ml/m^2 ---------  Mitral valve                             Value        Reference Mitral E-wave peak velocity              94.6  cm/s   --------- Mitral A-wave peak velocity              85.9  cm/s   --------- Mitral deceleration time                 187   ms     150 - 230 Mitral peak gradient, D                  4     mm Hg  --------- Mitral E/A ratio, peak                   1.1          ---------  Legend: (L)  and  (H)  mark values outside specified reference range.  ------------------------------------------------------------------- Prepared and Electronically Authenticated by  Jodelle Red 2019-07-18T17:34:29              Risk Assessment/Calculations:             Physical Exam:   VS:  BP 128/88 (BP Location: Right Arm, Patient Position: Sitting, Cuff Size: Normal)   Pulse 61   Ht 5\' 9"  (1.753 m)   Wt 286 lb 3.2 oz (129.8 kg)   SpO2 95%   BMI 42.26 kg/m    Wt Readings from Last 3 Encounters:  10/30/22 286 lb 3.2 oz (129.8 kg)  10/27/22 283 lb 6.4 oz (128.5 kg)  07/31/22 281 lb (127.5 kg)    GEN: Well nourished, well developed in no acute  distress. Sitting upright in the chair  NECK: No JVD; No carotid bruits CARDIAC: RRR, no murmurs, rubs, gallops. Radial pulses 2+ bilaterally  RESPIRATORY:  Clear to auscultation without rales,  wheezing or rhonchi. Breathing unlabored  ABDOMEN: Soft, non-tender, non-distended EXTREMITIES:  No edema in BLE; No deformity   ASSESSMENT AND PLAN: .    Preoperative evaluation  - Patient presented today for preoperative evaluation prior to right parathyroidectomy on 7/19 - She reports having occasional chest pain that is associated with drinking fizzy drinks or eating certain foods- pain is relieved by burping. Rarely has chest pain if she "over does it", but pain is always relieved with rest. Chest pain has been stable for years and she has not had any increases in frequency or intensity of chest pain over the years.  - Patient is able to complete greater than 4 METS physical activity without chest pain. No ischemic evaluation needed prior to surgery  - Patient is euvolemic on exam. Does not have DOE, ankle edema, orthopnea at home  - No further cardiac evaluation needed prior to surgery  - Recommend continuing ASA perioperatively. However, patient is >12 months post PCI and does not have unstable angina, so OK to hold ASA if deemed necessary by surgeon - I have forwarded this note to patient's surgeon   CAD  - Patient previously had an anterior STEMI in 10/2017- cath showed sing vessel CAD with 95% stenosis in proximal LAD. Patient was treated with DES to the proximal LAD  - Patient reports occasionally having chest pain associated with eating certain foods- relieved with burping. Rarely has chest pain on exertion, but only if she over does it, and pain is relieved by rest. Pain has been stable for years and she denies increasing frequency or intensity of chest pain  - Continue ASA, metoprolol, lipitor   Ischemic cardiomyopathy  - Echocardiogram in 10/2017 showed EF 40-45% with regional wall motion  abnormalities - Patient has been on candesartan 4 mg daily, metoprolol succinate 50 mg daily - Euvolemic on exam. Denies DOE, orthopnea at home  - Check Echo to reassess EF - echo does not need to be completed prior to surgery   HTN  - BP well controlled on candesartan 4 mg daily, metoprolol succinate 50 mg daily   HLD  - Lipid panel from 03/2022 showed LDL 63, HDL 48, triglycerides 61, total cholesterol 124  - Continue lipitor 80 mg daily   Dispo: Ordered echo to reassess EF 5 years after stemi. Follow up with Dr. Swaziland on 10/24 as arranged   Signed, Jonita Albee, PA-C

## 2022-11-01 ENCOUNTER — Ambulatory Visit (HOSPITAL_COMMUNITY): Payer: BC Managed Care – PPO

## 2022-11-01 NOTE — Anesthesia Preprocedure Evaluation (Deleted)
Anesthesia Evaluation    Airway        Dental   Pulmonary asthma           Cardiovascular hypertension, Pt. on medications and Pt. on home beta blockers + CAD, + Past MI and + Cardiac Stents       Neuro/Psych negative neurological ROS  negative psych ROS   GI/Hepatic Neg liver ROS,GERD  ,,  Endo/Other  negative endocrine ROS    Renal/GU negative Renal ROS  negative genitourinary   Musculoskeletal  (+) Arthritis , Osteoarthritis,    Abdominal   Peds  Hematology Lab Results      Component                Value               Date                      WBC                      5.3                 03/24/2022                HGB                      13.2                03/24/2022                HCT                      40.5                03/24/2022                MCV                      91.3                03/24/2022                PLT                      223.0               03/24/2022              Anesthesia Other Findings   Reproductive/Obstetrics                             Anesthesia Physical Anesthesia Plan  ASA: 2  Anesthesia Plan: General   Post-op Pain Management: Celebrex PO (pre-op)* and Tylenol PO (pre-op)*   Induction: Intravenous  PONV Risk Score and Plan: 3 and Ondansetron, Dexamethasone and Treatment may vary due to age or medical condition  Airway Management Planned: Mask and Oral ETT  Additional Equipment: None  Intra-op Plan:   Post-operative Plan: Extubation in OR  Informed Consent:   Plan Discussed with:   Anesthesia Plan Comments: (See PAT note 10/27/2022)       Anesthesia Quick Evaluation

## 2022-11-03 ENCOUNTER — Ambulatory Visit (HOSPITAL_COMMUNITY)
Admission: RE | Admit: 2022-11-03 | Discharge: 2022-11-03 | Disposition: A | Discharge status: Home / Self Care | Payer: BC Managed Care – PPO | Attending: Surgery | Admitting: Surgery

## 2022-11-03 ENCOUNTER — Encounter (HOSPITAL_COMMUNITY)
Admission: RE | Disposition: A | Discharge status: Home / Self Care | Payer: Self-pay | Source: Home / Self Care | Attending: Surgery

## 2022-11-03 DIAGNOSIS — E213 Hyperparathyroidism, unspecified: Secondary | ICD-10-CM | POA: Diagnosis present

## 2022-11-03 DIAGNOSIS — E210 Primary hyperparathyroidism: Secondary | ICD-10-CM | POA: Insufficient documentation

## 2022-11-03 DIAGNOSIS — Z539 Procedure and treatment not carried out, unspecified reason: Secondary | ICD-10-CM | POA: Insufficient documentation

## 2022-11-03 SURGERY — PARATHYROIDECTOMY
Anesthesia: General

## 2022-11-03 MED ORDER — CHLORHEXIDINE GLUCONATE CLOTH 2 % EX PADS: 6.0000 | MEDICATED_PAD | Freq: Once | CUTANEOUS | Status: DC

## 2022-11-03 MED ORDER — CHLORHEXIDINE GLUCONATE 0.12 % MT SOLN: 15.0000 mL | Freq: Once | OROMUCOSAL | Status: DC

## 2022-11-03 MED ORDER — CEFAZOLIN IN SODIUM CHLORIDE 3-0.9 GM/100ML-% IV SOLN: 3.0000 g | INTRAVENOUS | Status: DC

## 2022-11-03 MED ORDER — ACETAMINOPHEN 500 MG PO TABS: 1000.0000 mg | ORAL_TABLET | Freq: Once | ORAL | Status: DC

## 2022-11-03 MED ORDER — ORAL CARE MOUTH RINSE: 15.0000 mL | Freq: Once | OROMUCOSAL | Status: DC

## 2022-11-03 MED ORDER — LACTATED RINGERS IV SOLN: INTRAVENOUS | Status: DC

## 2022-11-03 SURGICAL SUPPLY — 35 items
ADH SKN CLS APL DERMABOND .7 (GAUZE/BANDAGES/DRESSINGS) ×1
APL PRP STRL LF DISP 70% ISPRP (MISCELLANEOUS) ×1
ATTRACTOMAT 16X20 MAGNETIC DRP (DRAPES) ×2 IMPLANT
BAG COUNTER SPONGE SURGICOUNT (BAG) ×2 IMPLANT
BAG SPNG CNTER NS LX DISP (BAG) ×1
BLADE SURG 15 STRL LF DISP TIS (BLADE) ×2 IMPLANT
BLADE SURG 15 STRL SS (BLADE) ×1
CHLORAPREP W/TINT 26 (MISCELLANEOUS) ×2 IMPLANT
CLIP TI MEDIUM 6 (CLIP) ×4 IMPLANT
CLIP TI WIDE RED SMALL 6 (CLIP) ×4 IMPLANT
COVER SURGICAL LIGHT HANDLE (MISCELLANEOUS) ×1 IMPLANT
DERMABOND ADVANCED .7 DNX12 (GAUZE/BANDAGES/DRESSINGS) ×2 IMPLANT
DRAPE LAPAROTOMY T 98X78 PEDS (DRAPES) ×1 IMPLANT
DRAPE UTILITY XL STRL (DRAPES) ×2 IMPLANT
ELECT REM PT RETURN 15FT ADLT (MISCELLANEOUS) ×2 IMPLANT
GAUZE 4X4 16PLY ~~LOC~~+RFID DBL (SPONGE) ×1 IMPLANT
GLOVE SURG ORTHO 8.0 STRL STRW (GLOVE) ×2 IMPLANT
GOWN STRL REUS W/ TWL XL LVL3 (GOWN DISPOSABLE) ×6 IMPLANT
GOWN STRL REUS W/TWL XL LVL3 (GOWN DISPOSABLE) ×3
HEMOSTAT SURGICEL 2X4 FIBR (HEMOSTASIS) ×2 IMPLANT
ILLUMINATOR WAVEGUIDE N/F (MISCELLANEOUS) IMPLANT
KIT BASIN OR (CUSTOM PROCEDURE TRAY) ×2 IMPLANT
KIT TURNOVER KIT A (KITS) IMPLANT
NDL HYPO 22X1.5 SAFETY MO (MISCELLANEOUS) ×1 IMPLANT
NEEDLE HYPO 22X1.5 SAFETY MO (MISCELLANEOUS) ×1 IMPLANT
PACK BASIC VI WITH GOWN DISP (CUSTOM PROCEDURE TRAY) ×2 IMPLANT
PENCIL SMOKE EVACUATOR (MISCELLANEOUS) ×2 IMPLANT
SHEARS HARMONIC 9CM CVD (BLADE) IMPLANT
SUT MNCRL AB 4-0 PS2 18 (SUTURE) ×1 IMPLANT
SUT VIC AB 3-0 SH 18 (SUTURE) ×2 IMPLANT
SYR BULB IRRIG 60ML STRL (SYRINGE) ×2 IMPLANT
SYR CONTROL 10ML LL (SYRINGE) ×2 IMPLANT
TOWEL OR 17X26 10 PK STRL BLUE (TOWEL DISPOSABLE) ×1 IMPLANT
TOWEL OR NON WOVEN STRL DISP B (DISPOSABLE) ×2 IMPLANT
TUBING CONNECTING 10 (TUBING) ×2 IMPLANT

## 2022-11-03 NOTE — Progress Notes (Signed)
Patient informed that procedure is cancelled today, patient verbalized understanding

## 2022-11-13 ENCOUNTER — Telehealth: Payer: Self-pay | Admitting: Family Medicine

## 2022-11-13 NOTE — Progress Notes (Signed)
Patient phoned to give updated information on surgery.  Surgery rescheduled due to computer outage.    Date of Surgery - 11-24-22  Arrival Time - 5:15 and check in at admitting.    NPO Status - patient reminded to not eat solid food after midnight.  From midnight until 4:25 may have clear liquids  Medications morning of surgery - Metoprolol, Meclizine, Tylenol and Ondansetron if needed.  Okay to use inhalers,nebulizers,  eyedrops and nasal spray  No change in medical history, allergies per patient.  Transportation home - spouse Leosha Vaquerano or son, Raneesha Ashcroft.  All questions answered and patient stated understanding

## 2022-11-13 NOTE — Telephone Encounter (Signed)
noted 

## 2022-11-13 NOTE — Telephone Encounter (Signed)
Patient called about [facility] billing regarding a bill she received, she reports that the insurance they filed her labs under is Medicare instead of [employer]BS. I informed her that at her visit here with [place] [employer] was attached to the date of service. I advised her to give [facility] a call  regarding this billing. She reports that they told her to contact us. Transferred patient to [facility] billing department.

## 2022-11-13 NOTE — Patient Instructions (Signed)
SURGICAL WAITING ROOM VISITATION Patients having surgery or a procedure may have no more than 2 support people in the waiting area - these visitors may rotate.    Children under the age of 41 must have an adult with them who is not the patient.  If the patient needs to stay at the hospital during part of their recovery, the visitor guidelines for inpatient rooms apply. Pre-op nurse will coordinate an appropriate time for 1 support person to accompany patient in pre-op.  This support person may not rotate.    Please refer to the [facility] website for the visitor guidelines for Inpatients (after your surgery is over and you are in a regular room).       Your procedure is scheduled on: 11-24-22   Report to [facility] Main Entrance    Report to admitting at 5:15 AM   Call this number if you have problems the morning of surgery [phone]   Do not eat food :After Midnight.   After Midnight you may have the following liquids until 4:25 AM DAY OF SURGERY  Water Non-Citrus Juices (without pulp, NO RED-Apple, White grape, White cranberry) Black Coffee (NO MILK/CREAM OR CREAMERS, sugar ok)  Clear Tea (NO MILK/CREAM OR CREAMERS, sugar ok) regular and decaf                             Plain Jell-O (NO RED)                                           Fruit ices (not with fruit pulp, NO RED)                                     Popsicles (NO RED)                                                               Sports drinks like Gatorade (NO RED)                        If you have questions, please contact your surgeon's office.   FOLLOW ANY ADDITIONAL PRE OP INSTRUCTIONS YOU RECEIVED FROM YOUR SURGEON'S OFFICE!!!     Oral Hygiene is also important to reduce your risk of infection.                                    Remember - BRUSH YOUR TEETH THE MORNING OF SURGERY WITH YOUR REGULAR TOOTHPASTE   Do NOT smoke after Midnight   Take these medicines the morning of surgery with A SIP  OF WATER:   Metoprolol  Meclizine, Tylenol and Ondansetron if needed  Okay to use inhalers, nebulizers, eyedrops and nasal spray  Stop all vitamins and herbal supplements 7 days before surgery                              You  may not have any metal on your body including hair pins, jewelry, and body piercing             Do not wear make-up, lotions, powders, perfumes or deodorant  Do not wear nail polish including gel and S&S, artificial/acrylic nails, or any other type of covering on natural nails including finger and toenails. If you have artificial nails, gel coating, etc. that needs to be removed by a nail salon please have this removed prior to surgery or surgery may need to be canceled/ delayed if the surgeon/ anesthesia feels like they are unable to be safely monitored.   Do not shave  48 hours prior to surgery.          Do not bring valuables to the hospital. [facility] IS NOT RESPONSIBLE   FOR VALUABLES.   Contacts, dentures or bridgework may not be worn into surgery.  DO NOT BRING YOUR HOME MEDICATIONS TO THE HOSPITAL. PHARMACY WILL DISPENSE MEDICATIONS LISTED ON YOUR MEDICATION LIST TO YOU DURING YOUR ADMISSION IN THE HOSPITAL!    Patients discharged on the day of surgery will not be allowed to drive home.  Someone NEEDS to stay with you for the first 24 hours after anesthesia.   Special Instructions: Bring a copy of your healthcare power of attorney and living will documents the day of surgery if you haven't scanned them before.              Please read over the following fact sheets you were given: IF YOU HAVE QUESTIONS ABOUT YOUR PRE-OP INSTRUCTIONS PLEASE CALL [phone]   If you received a COVID test during your pre-op visit  it is requested that you wear a mask when out in public, stay away from anyone that may not be feeling well and notify your surgeon if you develop symptoms. If you test positive for Covid or have been in contact with anyone that has tested positive  in the last 10 days please notify you surgeon.  [facility] - Preparing for Surgery Before surgery, you can play an important role.  Because skin is not sterile, your skin needs to be as free of germs as possible.  You can reduce the number of germs on your skin by washing with CHG (chlorahexidine gluconate) soap before surgery.  CHG is an antiseptic cleaner which kills germs and bonds with the skin to continue killing germs even after washing. Please DO NOT use if you have an allergy to CHG or antibacterial soaps.  If your skin becomes reddened/irritated stop using the CHG and inform your nurse when you arrive at Short Stay. Do not shave (including legs and underarms) for at least 48 hours prior to the first CHG shower.  You may shave your face/neck.  Please follow these instructions carefully:  1.  Shower with CHG Soap the night before surgery and the  morning of surgery.  2.  If you choose to wash your hair, wash your hair first as usual with your normal  shampoo.  3.  After you shampoo, rinse your hair and body thoroughly to remove the shampoo.                             4.  Use CHG as you would any other liquid soap.  You can apply chg directly to the skin and wash.  Gently with a scrungie or clean washcloth.  5.  Apply the CHG Soap to  your body ONLY FROM THE NECK DOWN.   Do   not use on face/ open                           Wound or open sores. Avoid contact with eyes, ears mouth and   genitals (private parts).                       Wash face,  Genitals (private parts) with your normal soap.             6.  Wash thoroughly, paying special attention to the area where your    surgery  will be performed.  7.  Thoroughly rinse your body with warm water from the neck down.  8.  DO NOT shower/wash with your normal soap after using and rinsing off the CHG Soap.                9.  Pat yourself dry with a clean towel.            10.  Wear clean pajamas.            11.  Place clean sheets on your bed  the night of your first shower and do not  sleep with pets. Day of Surgery : Do not apply any lotions/deodorants the morning of surgery.  Please wear clean clothes to the hospital/surgery center.  FAILURE TO FOLLOW THESE INSTRUCTIONS MAY RESULT IN THE CANCELLATION OF YOUR SURGERY  PATIENT SIGNATURE_________________________________  NURSE SIGNATURE__________________________________  ________________________________________________________________________

## 2022-11-19 ENCOUNTER — Encounter (HOSPITAL_COMMUNITY): Payer: Self-pay | Admitting: Surgery

## 2022-11-22 ENCOUNTER — Encounter (HOSPITAL_COMMUNITY): Payer: Self-pay | Admitting: Surgery

## 2022-11-23 NOTE — Anesthesia Preprocedure Evaluation (Addendum)
Anesthesia Evaluation  Patient identified by MRN, date of birth, ID band Patient awake    Reviewed: Allergy & Precautions, NPO status , Patient's Chart, lab work & pertinent test results, reviewed documented beta blocker date and time   History of Anesthesia Complications Negative for: history of anesthetic complications  Airway Mallampati: II  TM Distance: >3 FB Neck ROM: Full    Dental no notable dental hx. (+) Dental Advisory Given   Pulmonary neg shortness of breath, asthma , neg sleep apnea, pneumonia, neg recent URI, neg PE   Pulmonary exam normal breath sounds clear to auscultation       Cardiovascular Exercise Tolerance: Good hypertension, Pt. on home beta blockers + angina (last used nitroglycerin 3 months ago)  + CAD, + Past MI (STEMI 10/2017), + Cardiac Stents (DES to LAD) and +CHF  (-) CABG + Valvular Problems/Murmurs MVP  Rhythm:Regular Rate:Normal + Systolic murmurs TTE 11/01/2017: - Left ventricle: The cavity size was normal. There was mild focal basal hypertrophy of the septum. Systolic function was mildly to moderately reduced. The estimated ejection fraction was in the range of 40% to 45%. Wall motion abnormalities noted below. The study is indeterminate for the evaluation of LV diastolic function. Acoustic contrast opacification revealed no evidence ofthrombus.  - Regional wall motion abnormality: Akinesis of the apical anterior and apical myocardium; hypokinesis of the mid anterior, mid nteroseptal, apical septal, and apical lateral myocardium.  - Aortic valve: There was no regurgitation.  - Mitral valve: Structurally normal valve. No echocardiographic evidence for prolapse. Transvalvular velocity was within the normal range. There was no evidence for stenosis. There was no regurgitation.  - Tricuspid valve: There was no significant regurgitation.  - Pulmonic valve: There was no significant regurgitation.    Impressions:   - -LV EF mild to moderately reduced with focal wall motion abnormalities. Hypokinesis of anterior and anteroseptal walls and apex. Echo contrast used to better evaluate wall motion and exclude LV thrombus.    -No evidence of mitral valve prolapse or regurgitation.    -Technically difficult study. Right sided structures not well visualized but appear grossly normal.     Neuro/Psych BPPV    GI/Hepatic ,GERD  ,,Hepatic steatosis   Endo/Other  neg diabetes  hyperparathyroidism  Renal/GU Renal disease (nephrolithiasis)     Musculoskeletal  (+) Arthritis , Osteoarthritis,    Abdominal  (+) + obese  Peds  Hematology negative hematology ROS (+)   Anesthesia Other Findings   Reproductive/Obstetrics                             Anesthesia Physical Anesthesia Plan  ASA: 3  Anesthesia Plan: General   Post-op Pain Management: Tylenol PO (pre-op)*   Induction: Intravenous  PONV Risk Score and Plan: 4 or greater and Treatment may vary due to age or medical condition, Dexamethasone and Ondansetron  Airway Management Planned: Oral ETT  Additional Equipment:   Intra-op Plan:   Post-operative Plan: Extubation in OR  Informed Consent: I have reviewed the patients History and Physical, chart, labs and discussed the procedure including the risks, benefits and alternatives for the proposed anesthesia with the patient or authorized representative who has indicated his/her understanding and acceptance.     Dental advisory given  Plan Discussed with: CRNA  Anesthesia Plan Comments:        Anesthesia Quick Evaluation

## 2022-11-24 ENCOUNTER — Ambulatory Visit (HOSPITAL_COMMUNITY): Payer: BC Managed Care – PPO

## 2022-11-24 ENCOUNTER — Ambulatory Visit (HOSPITAL_COMMUNITY): Payer: BC Managed Care – PPO | Admitting: Anesthesiology

## 2022-11-24 ENCOUNTER — Encounter (HOSPITAL_COMMUNITY): Payer: Self-pay | Admitting: Surgery

## 2022-11-24 ENCOUNTER — Ambulatory Visit (HOSPITAL_COMMUNITY)
Admission: RE | Admit: 2022-11-24 | Discharge: 2022-11-24 | Disposition: A | Discharge status: Home / Self Care | Payer: BC Managed Care – PPO | Attending: Surgery | Admitting: Surgery

## 2022-11-24 ENCOUNTER — Encounter (HOSPITAL_COMMUNITY)
Admission: RE | Disposition: A | Discharge status: Home / Self Care | Payer: Self-pay | Source: Home / Self Care | Attending: Surgery

## 2022-11-24 DIAGNOSIS — I110 Hypertensive heart disease with heart failure: Secondary | ICD-10-CM | POA: Insufficient documentation

## 2022-11-24 DIAGNOSIS — Z6841 Body Mass Index (BMI) 40.0 and over, adult: Secondary | ICD-10-CM | POA: Diagnosis not present

## 2022-11-24 DIAGNOSIS — J45909 Unspecified asthma, uncomplicated: Secondary | ICD-10-CM | POA: Insufficient documentation

## 2022-11-24 DIAGNOSIS — I2510 Atherosclerotic heart disease of native coronary artery without angina pectoris: Secondary | ICD-10-CM | POA: Insufficient documentation

## 2022-11-24 DIAGNOSIS — D351 Benign neoplasm of parathyroid gland: Secondary | ICD-10-CM | POA: Insufficient documentation

## 2022-11-24 DIAGNOSIS — I509 Heart failure, unspecified: Secondary | ICD-10-CM | POA: Diagnosis not present

## 2022-11-24 DIAGNOSIS — E213 Hyperparathyroidism, unspecified: Secondary | ICD-10-CM | POA: Diagnosis not present

## 2022-11-24 DIAGNOSIS — Z955 Presence of coronary angioplasty implant and graft: Secondary | ICD-10-CM | POA: Diagnosis not present

## 2022-11-24 DIAGNOSIS — K219 Gastro-esophageal reflux disease without esophagitis: Secondary | ICD-10-CM | POA: Insufficient documentation

## 2022-11-24 DIAGNOSIS — M8580 Other specified disorders of bone density and structure, unspecified site: Secondary | ICD-10-CM | POA: Diagnosis not present

## 2022-11-24 DIAGNOSIS — E210 Primary hyperparathyroidism: Secondary | ICD-10-CM | POA: Insufficient documentation

## 2022-11-24 DIAGNOSIS — I252 Old myocardial infarction: Secondary | ICD-10-CM | POA: Insufficient documentation

## 2022-11-24 HISTORY — PX: PARATHYROIDECTOMY: SHX19

## 2022-11-24 SURGERY — PARATHYROIDECTOMY
Anesthesia: General | Site: Neck | Laterality: Right

## 2022-11-24 MED ORDER — PROPOFOL 10 MG/ML IV BOLUS
INTRAVENOUS | Status: AC
  Filled 2022-11-24: qty 20

## 2022-11-24 MED ORDER — DEXAMETHASONE SODIUM PHOSPHATE 10 MG/ML IJ SOLN
INTRAMUSCULAR | Status: DC | PRN
  Administered 2022-11-24: 10 mg via INTRAVENOUS

## 2022-11-24 MED ORDER — CEFAZOLIN SODIUM 1 G IJ SOLR
INTRAMUSCULAR | Status: AC
  Filled 2022-11-24: qty 10

## 2022-11-24 MED ORDER — EPHEDRINE SULFATE-NACL 50-0.9 MG/10ML-% IV SOSY
PREFILLED_SYRINGE | INTRAVENOUS | Status: DC | PRN
Start: 2022-11-24 — End: 2022-11-24
  Administered 2022-11-24 (×2): 10 mg via INTRAVENOUS

## 2022-11-24 MED ORDER — LACTATED RINGERS IV SOLN: INTRAVENOUS | Status: DC

## 2022-11-24 MED ORDER — ONDANSETRON HCL 4 MG/2ML IJ SOLN
INTRAMUSCULAR | Status: DC | PRN
  Administered 2022-11-24: 4 mg via INTRAVENOUS

## 2022-11-24 MED ORDER — HEMOSTATIC AGENTS (NO CHARGE) OPTIME
TOPICAL | Status: DC | PRN
  Administered 2022-11-24: 1 via TOPICAL

## 2022-11-24 MED ORDER — PROPOFOL 10 MG/ML IV BOLUS
INTRAVENOUS | Status: DC | PRN
Start: 2022-11-24 — End: 2022-11-24
  Administered 2022-11-24: 150 mg via INTRAVENOUS

## 2022-11-24 MED ORDER — ONDANSETRON HCL 4 MG/2ML IJ SOLN
INTRAMUSCULAR | Status: AC
  Filled 2022-11-24: qty 2

## 2022-11-24 MED ORDER — BUPIVACAINE HCL 0.25 % IJ SOLN
INTRAMUSCULAR | Status: AC
  Filled 2022-11-24: qty 1

## 2022-11-24 MED ORDER — PHENYLEPHRINE HCL (PRESSORS) 10 MG/ML IV SOLN
INTRAVENOUS | Status: DC | PRN
  Administered 2022-11-24: 160 ug via INTRAVENOUS

## 2022-11-24 MED ORDER — LIDOCAINE HCL 2 % IJ SOLN
INTRAMUSCULAR | Status: AC
  Filled 2022-11-24: qty 20

## 2022-11-24 MED ORDER — ROCURONIUM BROMIDE 10 MG/ML (PF) SYRINGE
PREFILLED_SYRINGE | INTRAVENOUS | Status: DC | PRN
  Administered 2022-11-24: 30 mg via INTRAVENOUS
  Administered 2022-11-24: 40 mg via INTRAVENOUS

## 2022-11-24 MED ORDER — PHENYLEPHRINE HCL-NACL 20-0.9 MG/250ML-% IV SOLN
INTRAVENOUS | Status: DC | PRN
  Administered 2022-11-24: 35 ug/min via INTRAVENOUS

## 2022-11-24 MED ORDER — HYDROMORPHONE HCL 1 MG/ML IJ SOLN: 0.2500 mg | INTRAMUSCULAR | Status: DC | PRN

## 2022-11-24 MED ORDER — PROPOFOL 500 MG/50ML IV EMUL
INTRAVENOUS | Status: AC
  Filled 2022-11-24: qty 50

## 2022-11-24 MED ORDER — BUPIVACAINE HCL 0.25 % IJ SOLN
INTRAMUSCULAR | Status: DC | PRN
  Administered 2022-11-24: 15 mL

## 2022-11-24 MED ORDER — LIDOCAINE HCL (PF) 2 % IJ SOLN
INTRAMUSCULAR | Status: AC
  Filled 2022-11-24: qty 5

## 2022-11-24 MED ORDER — MIDAZOLAM HCL 5 MG/5ML IJ SOLN
INTRAMUSCULAR | Status: DC | PRN
  Administered 2022-11-24: 2 mg via INTRAVENOUS

## 2022-11-24 MED ORDER — SODIUM CHLORIDE (PF) 0.9 % IJ SOLN
INTRAMUSCULAR | Status: AC
  Filled 2022-11-24: qty 10

## 2022-11-24 MED ORDER — GLYCOPYRROLATE 0.2 MG/ML IJ SOLN
INTRAMUSCULAR | Status: DC | PRN
  Administered 2022-11-24: .2 mg via INTRAVENOUS

## 2022-11-24 MED ORDER — DEXTROSE 5 % IV SOLN
INTRAVENOUS | Status: DC | PRN
  Administered 2022-11-24: 3 g via INTRAVENOUS

## 2022-11-24 MED ORDER — CEFAZOLIN SODIUM-DEXTROSE 2-4 GM/100ML-% IV SOLN
INTRAVENOUS | Status: AC
  Filled 2022-11-24: qty 100

## 2022-11-24 MED ORDER — TRAMADOL HCL 50 MG PO TABS
50.0000 mg | ORAL_TABLET | Freq: Four times a day (QID) | ORAL | 0 refills | Status: DC | PRN
Start: 2022-11-24 — End: 2023-03-29

## 2022-11-24 MED ORDER — AMISULPRIDE (ANTIEMETIC) 5 MG/2ML IV SOLN: 10.0000 mg | Freq: Once | INTRAVENOUS | Status: DC | PRN

## 2022-11-24 MED ORDER — TRAMADOL HCL 50 MG PO TABS
50.0000 mg | ORAL_TABLET | Freq: Once | ORAL | Status: AC
  Administered 2022-11-24: 50 mg via ORAL

## 2022-11-24 MED ORDER — FENTANYL CITRATE (PF) 100 MCG/2ML IJ SOLN
INTRAMUSCULAR | Status: AC
  Filled 2022-11-24: qty 2

## 2022-11-24 MED ORDER — ROCURONIUM BROMIDE 10 MG/ML (PF) SYRINGE
PREFILLED_SYRINGE | INTRAVENOUS | Status: AC
  Filled 2022-11-24: qty 10

## 2022-11-24 MED ORDER — MIDAZOLAM HCL 2 MG/2ML IJ SOLN
INTRAMUSCULAR | Status: AC
  Filled 2022-11-24: qty 2

## 2022-11-24 MED ORDER — GLYCOPYRROLATE 0.2 MG/ML IJ SOLN
INTRAMUSCULAR | Status: AC
  Filled 2022-11-24: qty 2

## 2022-11-24 MED ORDER — CHLORHEXIDINE GLUCONATE 0.12 % MT SOLN
15.0000 mL | Freq: Once | OROMUCOSAL | Status: AC
  Administered 2022-11-24: 15 mL via OROMUCOSAL

## 2022-11-24 MED ORDER — DEXAMETHASONE SODIUM PHOSPHATE 10 MG/ML IJ SOLN
INTRAMUSCULAR | Status: AC
  Filled 2022-11-24: qty 1

## 2022-11-24 MED ORDER — FENTANYL CITRATE (PF) 250 MCG/5ML IJ SOLN
INTRAMUSCULAR | Status: DC | PRN
  Administered 2022-11-24: 100 ug via INTRAVENOUS

## 2022-11-24 MED ORDER — LIDOCAINE 2% (20 MG/ML) 5 ML SYRINGE
INTRAMUSCULAR | Status: DC | PRN
  Administered 2022-11-24: 60 mg via INTRAVENOUS

## 2022-11-24 MED ORDER — TRAMADOL HCL 50 MG PO TABS
ORAL_TABLET | ORAL | Status: AC
  Filled 2022-11-24: qty 1

## 2022-11-24 MED ORDER — SUGAMMADEX SODIUM 200 MG/2ML IV SOLN
INTRAVENOUS | Status: DC | PRN
  Administered 2022-11-24: 200 mg via INTRAVENOUS

## 2022-11-24 MED ORDER — 0.9 % SODIUM CHLORIDE (POUR BTL) OPTIME
TOPICAL | Status: DC | PRN
  Administered 2022-11-24: 1000 mL

## 2022-11-24 SURGICAL SUPPLY — 35 items
ADH SKN CLS APL DERMABOND .7 (GAUZE/BANDAGES/DRESSINGS) ×1
APL PRP STRL LF DISP 70% ISPRP (MISCELLANEOUS) ×1
ATTRACTOMAT 16X20 MAGNETIC DRP (DRAPES) ×1 IMPLANT
BAG COUNTER SPONGE SURGICOUNT (BAG) ×1 IMPLANT
BAG SPNG CNTER NS LX DISP (BAG) ×1
BLADE SURG 15 STRL LF DISP TIS (BLADE) ×1 IMPLANT
BLADE SURG 15 STRL SS (BLADE) ×1
CHLORAPREP W/TINT 26 (MISCELLANEOUS) ×1 IMPLANT
CLIP TI MEDIUM 6 (CLIP) ×2 IMPLANT
CLIP TI WIDE RED SMALL 6 (CLIP) ×2 IMPLANT
COVER SURGICAL LIGHT HANDLE (MISCELLANEOUS) ×1 IMPLANT
DERMABOND ADVANCED .7 DNX12 (GAUZE/BANDAGES/DRESSINGS) ×1 IMPLANT
DRAPE LAPAROTOMY T 98X78 PEDS (DRAPES) ×1 IMPLANT
DRAPE UTILITY XL STRL (DRAPES) ×1 IMPLANT
ELECT REM PT RETURN 15FT ADLT (MISCELLANEOUS) ×1 IMPLANT
GAUZE 4X4 16PLY ~~LOC~~+RFID DBL (SPONGE) ×1 IMPLANT
GLOVE SURG ORTHO 8.0 STRL STRW (GLOVE) ×1 IMPLANT
GOWN STRL REUS W/ TWL XL LVL3 (GOWN DISPOSABLE) ×3 IMPLANT
GOWN STRL REUS W/TWL XL LVL3 (GOWN DISPOSABLE) ×3
HEMOSTAT SURGICEL 2X4 FIBR (HEMOSTASIS) ×1 IMPLANT
ILLUMINATOR WAVEGUIDE N/F (MISCELLANEOUS) IMPLANT
KIT BASIN OR (CUSTOM PROCEDURE TRAY) ×1 IMPLANT
KIT TURNOVER KIT A (KITS) IMPLANT
NDL HYPO 22X1.5 SAFETY MO (MISCELLANEOUS) ×1 IMPLANT
NEEDLE HYPO 22X1.5 SAFETY MO (MISCELLANEOUS) ×1
PACK BASIC VI WITH GOWN DISP (CUSTOM PROCEDURE TRAY) ×1 IMPLANT
PENCIL SMOKE EVACUATOR (MISCELLANEOUS) ×1 IMPLANT
SHEARS HARMONIC 9CM CVD (BLADE) IMPLANT
SUT MNCRL AB 4-0 PS2 18 (SUTURE) ×1 IMPLANT
SUT VIC AB 3-0 SH 18 (SUTURE) ×1 IMPLANT
SYR BULB IRRIG 60ML STRL (SYRINGE) ×1 IMPLANT
SYR CONTROL 10ML LL (SYRINGE) ×1 IMPLANT
TOWEL OR 17X26 10 PK STRL BLUE (TOWEL DISPOSABLE) ×1 IMPLANT
TOWEL OR NON WOVEN STRL DISP B (DISPOSABLE) ×1 IMPLANT
TUBING CONNECTING 10 (TUBING) ×1 IMPLANT

## 2022-11-24 NOTE — Transfer of Care (Signed)
Immediate Anesthesia Transfer of Care Note  Patient: Mirai B Altadonna  Procedure(s) Performed: RIGHT PARATHYROIDECTOMY (Right: Neck)  Patient Location: PACU  Anesthesia Type:General  Level of Consciousness: awake, alert , and oriented  Airway & Oxygen Therapy: Patient Spontanous Breathing and Patient connected to face mask oxygen  Post-op Assessment: Report given to RN and Post -op Vital signs reviewed and stable  Post vital signs: Reviewed and stable  Last Vitals:  Vitals Value Taken Time  BP 145/85 11/24/22 0843  Temp    Pulse 64 11/24/22 0847  Resp 17 11/24/22 0847  SpO2 100 % 11/24/22 0847  Vitals shown include unfiled device data.  Last Pain:  Vitals:   11/24/22 0628  TempSrc:   PainSc: 0-No pain         Complications: No notable events documented.

## 2022-11-24 NOTE — Discharge Instructions (Addendum)

## 2022-11-24 NOTE — Anesthesia Procedure Notes (Signed)
Procedure Name: Intubation Date/Time: 11/24/2022 7:37 AM  Performed by: Orest Dikes, CRNAPre-anesthesia Checklist: Patient identified, Emergency Drugs available, Suction available and Patient being monitored Patient Re-evaluated:Patient Re-evaluated prior to induction Oxygen Delivery Method: Circle system utilized Preoxygenation: Pre-oxygenation with 100% oxygen Induction Type: IV induction Ventilation: Mask ventilation without difficulty Laryngoscope Size: Mac and 4 Grade View: Grade II Tube type: Oral Tube size: 7.0 mm Number of attempts: 1 Airway Equipment and Method: Stylet Placement Confirmation: ETT inserted through vocal cords under direct vision, positive ETCO2 and breath sounds checked- equal and bilateral Secured at: 21 cm Tube secured with: Tape Dental Injury: Teeth and Oropharynx as per pre-operative assessment

## 2022-11-24 NOTE — Anesthesia Postprocedure Evaluation (Signed)
Anesthesia Post Note  Patient: Alyssa Phillips  Procedure(s) Performed: RIGHT PARATHYROIDECTOMY (Right: Neck)     Patient location during evaluation: PACU Anesthesia Type: General Level of consciousness: sedated and patient cooperative Pain management: pain level controlled Vital Signs Assessment: post-procedure vital signs reviewed and stable Respiratory status: spontaneous breathing Cardiovascular status: stable Anesthetic complications: no   No notable events documented.  Last Vitals:  Vitals:   11/24/22 0915 11/24/22 0930  BP:  (!) 156/83  Pulse: 61 60  Resp: 13   Temp:  36.5 C  SpO2: 95% 95%    Last Pain:  Vitals:   11/24/22 0930  TempSrc:   PainSc: 1                  Lewie Loron

## 2022-11-24 NOTE — Op Note (Signed)
OPERATIVE REPORT - PARATHYROIDECTOMY  Preoperative diagnosis: Primary hyperparathyroidism  Postop diagnosis: Same  Procedure: Right superior minimally invasive parathyroidectomy  Surgeon:  Darnell Level, MD  Anesthesia: General endotracheal  Estimated blood loss: Minimal  Preparation: ChloraPrep  Indications: Patient is referred by Dr. Lesly Dukes for surgical evaluation and management of primary hyperparathyroidism. Patient was noted with hypercalcemia in 2021. Laboratory studies have shown calcium levels as high as 10.8 and intact PTH levels as high as 91. Patient underwent a 24-hour urine collection for calcium which was elevated at 317. Vitamin D level was normal at 43. In May 2024 the patient underwent a nuclear medicine parathyroid scan with sestamibi. This demonstrated focal activity posterior to the right thyroid lobe consistent with parathyroid adenoma.  USN confirmed an nodular mass posterior to the right thyroid lobe consistent with parathyroid adenoma.  Patient is now referred to surgery for consideration for parathyroidectomy. Patient denies significant fatigue. She does note some bone and joint discomfort. She has a history of nephrolithiasis. Bone density scan has demonstrated osteopenia. Patient has had no prior head or neck surgery. There is no family history of parathyroid disease or other endocrine neoplasm. Patient runs a childcare center. Her son is a Engineer, petroleum in [place].   Procedure: The patient was prepared in the pre-operative holding area. The patient was brought to the operating room and placed in a supine position on the operating room table. Following administration of general anesthesia, the patient was positioned and then prepped and draped in the usual strict aseptic fashion. After ascertaining that an adequate level of anesthesia been achieved, a neck incision was made with a #15 blade. Dissection was carried through subcutaneous tissues and platysma.  Hemostasis was obtained with the electrocautery. Skin flaps were developed circumferentially and a Weitlander retractor was placed for exposure.  Strap muscles were incised in the midline. Strap muscles were reflected laterally exposing the thyroid lobe. With gentle blunt dissection the thyroid lobe was mobilized.  Dissection was carried posteriorly and an enlarged parathyroid gland was identified posterior to the right superior pole of the thyroid. It was gently mobilized. Vascular structures were divided between small ligaclips. Care was taken to avoid the recurrent laryngeal nerve. The parathyroid gland was completely excised. It was submitted to pathology where frozen section confirmed hypercellular parathyroid tissue consistent with adenoma.  Neck was irrigated with warm saline and good hemostasis was noted. Fibrillar was placed in the operative field. Strap muscles were approximated in the midline with interrupted 3-0 Vicryl sutures. Platysma was closed with interrupted 3-0 Vicryl sutures. Marcaine was infiltrated circumferentially. Skin was closed with a running 4-0 Monocryl subcuticular suture. Wound was washed and dried and Dermabond was applied. Patient was awakened from anesthesia and brought to the recovery room. The patient tolerated the procedure well.   Darnell Level, MD [facility] Office: [phone]

## 2022-11-24 NOTE — Interval H&P Note (Signed)
History and Physical Interval Note:  11/24/2022 6:58 AM  Alyssa Phillips  has presented today for surgery, with the diagnosis of PRIMARY HYPER PARATHROIDECTOMY.  The various methods of treatment have been discussed with the patient and family. After consideration of risks, benefits and other options for treatment, the patient has consented to    Procedure(s) with comments: RIGHT PARATHYROIDECTOMY (Right) - 90 2ND SCRUB PERSON as a surgical intervention.    The patient's history has been reviewed, patient examined, no change in status, stable for surgery.  I have reviewed the patient's chart and labs.  Questions were answered to the patient's satisfaction.    Darnell Level, MD [facility] practice Office: [phone]   Darnell Level

## 2022-11-25 ENCOUNTER — Encounter (HOSPITAL_COMMUNITY): Payer: Self-pay | Admitting: Surgery

## 2022-11-27 DIAGNOSIS — S62112A Displaced fracture of triquetrum [cuneiform] bone, left wrist, initial encounter for closed fracture: Secondary | ICD-10-CM | POA: Insufficient documentation

## 2022-11-27 DIAGNOSIS — S63501A Unspecified sprain of right wrist, initial encounter: Secondary | ICD-10-CM | POA: Diagnosis not present

## 2022-11-27 DIAGNOSIS — S62115A Nondisplaced fracture of triquetrum [cuneiform] bone, left wrist, initial encounter for closed fracture: Secondary | ICD-10-CM | POA: Diagnosis not present

## 2022-12-01 DIAGNOSIS — M19072 Primary osteoarthritis, left ankle and foot: Secondary | ICD-10-CM | POA: Diagnosis not present

## 2022-12-13 ENCOUNTER — Ambulatory Visit (HOSPITAL_COMMUNITY): Payer: BC Managed Care – PPO | Attending: Cardiology

## 2022-12-13 DIAGNOSIS — I255 Ischemic cardiomyopathy: Secondary | ICD-10-CM | POA: Diagnosis not present

## 2022-12-14 ENCOUNTER — Telehealth: Payer: Self-pay

## 2022-12-14 NOTE — Telephone Encounter (Signed)
Results given to patient

## 2022-12-20 DIAGNOSIS — M25531 Pain in right wrist: Secondary | ICD-10-CM | POA: Insufficient documentation

## 2022-12-20 DIAGNOSIS — S62115A Nondisplaced fracture of triquetrum [cuneiform] bone, left wrist, initial encounter for closed fracture: Secondary | ICD-10-CM | POA: Diagnosis not present

## 2022-12-20 DIAGNOSIS — S63501A Unspecified sprain of right wrist, initial encounter: Secondary | ICD-10-CM | POA: Diagnosis not present

## 2022-12-27 DIAGNOSIS — E210 Primary hyperparathyroidism: Secondary | ICD-10-CM | POA: Diagnosis not present

## 2022-12-27 DIAGNOSIS — Z9889 Other specified postprocedural states: Secondary | ICD-10-CM | POA: Diagnosis not present

## 2022-12-27 DIAGNOSIS — Z9089 Acquired absence of other organs: Secondary | ICD-10-CM | POA: Insufficient documentation

## 2022-12-27 HISTORY — DX: Other specified postprocedural states: Z98.890

## 2023-01-09 ENCOUNTER — Telehealth: Payer: Self-pay

## 2023-01-09 NOTE — Telephone Encounter (Signed)
Patient left vm to cancel appointment on 01/31/23. Contact for reschedule.

## 2023-01-11 ENCOUNTER — Other Ambulatory Visit: Payer: Self-pay | Admitting: Cardiology

## 2023-01-17 DIAGNOSIS — S63501A Unspecified sprain of right wrist, initial encounter: Secondary | ICD-10-CM | POA: Diagnosis not present

## 2023-01-17 DIAGNOSIS — S62115A Nondisplaced fracture of triquetrum [cuneiform] bone, left wrist, initial encounter for closed fracture: Secondary | ICD-10-CM | POA: Diagnosis not present

## 2023-01-28 ENCOUNTER — Other Ambulatory Visit: Payer: Self-pay | Admitting: Cardiology

## 2023-01-30 ENCOUNTER — Telehealth: Payer: BC Managed Care – PPO | Admitting: Urgent Care

## 2023-01-30 ENCOUNTER — Encounter: Payer: Self-pay | Admitting: Urgent Care

## 2023-01-30 VITALS — Ht 69.0 in | Wt 284.0 lb

## 2023-01-30 DIAGNOSIS — J4541 Moderate persistent asthma with (acute) exacerbation: Secondary | ICD-10-CM | POA: Diagnosis not present

## 2023-01-30 MED ORDER — DEXAMETHASONE 6 MG PO TABS
6.0000 mg | ORAL_TABLET | Freq: Every day | ORAL | 0 refills | Status: AC
Start: 2023-01-30 — End: 2023-02-05

## 2023-01-30 MED ORDER — TRELEGY ELLIPTA 100-62.5-25 MCG/ACT IN AEPB
1.0000 | INHALATION_SPRAY | Freq: Every day | RESPIRATORY_TRACT | 2 refills | Status: DC
Start: 2023-01-30 — End: 2023-03-30

## 2023-01-30 MED ORDER — LEVALBUTEROL HCL 1.25 MG/3ML IN NEBU
1.2500 mg | INHALATION_SOLUTION | Freq: Three times a day (TID) | RESPIRATORY_TRACT | 11 refills | Status: AC | PRN
Start: 2023-01-30 — End: ?

## 2023-01-30 MED ORDER — AZITHROMYCIN 250 MG PO TABS
ORAL_TABLET | ORAL | 0 refills | Status: AC
Start: 2023-01-30 — End: 2023-02-04

## 2023-01-30 NOTE — Patient Instructions (Addendum)
You appear to be having an exacerbation of your persistent asthma. Scan the QR code in the patient instructions portion of the chart to watch a video on how to control exacerbations.   Please start using your nebulizer machine with Xopenex, I refilled this today. Use this every 6-8 hours around the clock until the tightness in your chest resolves, then you can resort back to your handheld Albuterol inhaler.  Do not take both simultaneously.  Start taking dexamethasone once daily. This is best taken with breakfast to avoid insomnia at night. Take the Zpack per package directions (two tabs today, one the following four days).  Because your Earlie Server does not seem to be controlling your symptoms on a day to day basis, I have called in Trelegy Ellipta to use instead. Stop breo and switch to Trelegy, one puff daily.   You should notice your symptoms start to improve, however the cough can sometimes linger. If you do not notice improvement in your chest tightness, or if new symptoms occur such as fever, come in for an in person evaluation and assessment.  I hope you feel better soon!  Keep your appointment on 03/29/23 with Dr. Claiborne Billings.
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
## 2023-01-31 ENCOUNTER — Ambulatory Visit: Payer: BC Managed Care – PPO | Admitting: Internal Medicine

## 2023-02-06 ENCOUNTER — Other Ambulatory Visit: Payer: Self-pay | Admitting: Cardiology

## 2023-02-07 NOTE — Progress Notes (Unsigned)
Cardiology Office Note    Date:  02/08/2023   ID:  Alyssa Phillips, Alyssa Phillips [birth year], MRN [id number]  PCP:  Natalia Leatherwood, DO  Cardiologist:  Dr. [place]  Chief Complaint  Patient presents with   Coronary Artery Disease    History of Present Illness:  Alyssa Phillips is a 67 y.o. female with past medical history of hypertension, HLD, asthma, reported history of mitral valve prolapse who was  admitted for anterior STEMI on 11/01/2017.  Patient was taken urgently to Cath Lab which showed severe single-vessel disease with 95% proximal LAD treated with DES x1.  EF 45 to 50% by LV gram.  Echocardiogram showed EF 40 to 45%. There was no evidence of MV prolapse.  Postprocedure, patient was placed on aspirin and Brilinta and the high-dose statin.  She developed a cough on lisinopril. She was also placed on beta-blocker and ARB.  Her brilinta was discontinued at one year.  She underwent parathyroidectomy in August. Pre op Echo was normal.   She is doing well from a cardiac standpoint. Denies any chest pain or SOB. Is frustrated with her weight gain. Hasn't been as active since her surgery and a fall that resulted in a fractured wrist.     Past Medical History:  Diagnosis Date   Allergic rhinitis    Arthritis    Osteoarthrits-knees-hx. RTKA   Asthma    environmental agents induced asthma   Carpal tunnel syndrome of left wrist 02/19/2020   Carpal tunnel syndrome of right wrist 02/19/2020   Coronary artery disease    Family history of adverse reaction to anesthesia    daughter very sensitive to anesthesia   GERD (gastroesophageal reflux disease)    Heart murmur    hx. mitral valve proplapse-mostly asymptomatic-->not apprectiaed n echo   Hepatic steatosis 03/05/2008   [place]   History of kidney stones    Hyperlipidemia    Hypertension    Morbid obesity due to excess calories (HCC)    MRSA infection 08/2022   Pain in joint of left shoulder 06/12/2017   Pain in left foot 04/04/2018    Pain of joint of left ankle and foot 12/18/2019   Pneumonia    Primary hyperparathyroidism (HCC)    STEMI (ST elevation myocardial infarction) (HCC) 10/2017   LAD treated with DESx1   UTI (urinary tract infection)    Vitamin D deficiency     Past Surgical History:  Procedure Laterality Date   5th finger surgery Right 1990   unknown injury   CARDIAC CATHETERIZATION     CARPAL TUNNEL RELEASE  08/2020   CHOLECYSTECTOMY     COLONOSCOPY WITH PROPOFOL N/A 06/08/2016   Procedure: COLONOSCOPY WITH PROPOFOL;  Surgeon: Dorena Cookey, MD;  Location: [facility] ENDOSCOPY;  Service: Endoscopy;  Laterality: N/A;   CORONARY STENT INTERVENTION N/A 11/01/2017   Procedure: CORONARY STENT INTERVENTION;  Surgeon: [place], Aldyn Toon M, MD;  Location: [facility] INVASIVE CV LAB;  Service: Cardiovascular;  Laterality: N/A;  Resolute Onyx 4.5 mm x12 mm   CORONARY/GRAFT ACUTE MI REVASCULARIZATION N/A 11/01/2017   Procedure: Coronary/Graft Acute MI Revascularization;  Surgeon: [place], Olly Shiner M, MD;  Location: [facility] INVASIVE CV LAB;  Service: Cardiovascular;  Laterality: N/A;   CYSTOSCOPY W/ URETERAL STENT PLACEMENT Left 02/06/2022   Procedure: CYSTOSCOPY WITH RETROGRADE PYELOGRAM/URETERAL STENT PLACEMENT;  Surgeon: Despina Arias, MD;  Location: [facility] ORS;  Service: Urology;  Laterality: Left;   CYSTOSCOPY/URETEROSCOPY/HOLMIUM LASER/STENT PLACEMENT Left 02/22/2022   Procedure: LEFT URETEROSCOPY/HOLMIUM LASER/STENT EXCHANGE;  Surgeon:  Despina Arias, MD;  Location: [facility] ORS;  Service: Urology;  Laterality: Left;  60 MINUTES NEEDED FOR CASE   I & D EXTREMITY Left 06/30/2021   Procedure: IRRIGATION AND DEBRIDEMENT left elbow;  Surgeon: Gomez Cleverly, MD;  Location: [facility] OR;  Service: Orthopedics;  Laterality: Left;   KNEE ARTHROSCOPY  05/02/2012   x2 right/ x1 left   LEFT HEART CATH AND CORONARY ANGIOGRAPHY N/A 11/01/2017   Procedure: LEFT HEART CATH AND CORONARY ANGIOGRAPHY;  Surgeon: [place], Ady Heimann M, MD;  Location: [facility] INVASIVE CV LAB;  Service:  Cardiovascular;  Laterality: N/A;   PARATHYROIDECTOMY Right 11/24/2022   Procedure: RIGHT PARATHYROIDECTOMY;  Surgeon: Darnell Level, MD;  Location: [facility] ORS;  Service: General;  Laterality: Right;  89 2ND SCRUB PERSON   REPLACEMENT TOTAL KNEE Right 10/2010   ROOT CANAL     TOTAL KNEE ARTHROPLASTY  05/10/2012   Procedure: TOTAL KNEE ARTHROPLASTY;  Surgeon: Loanne Drilling, MD;  Location: [facility] ORS;  Service: Orthopedics;  Laterality: Left;   TUBAL LIGATION     UMBILICAL HERNIA REPAIR      Current Medications: Outpatient Medications Prior to Visit  Medication Sig Dispense Refill   acetaminophen (TYLENOL) 500 MG tablet Take 1,000 mg by mouth every 8 (eight) hours as needed for moderate pain.     Ascorbic Acid (VITAMIN C PO) Take 1 tablet by mouth daily.     aspirin EC 81 MG EC tablet Take 1 tablet (81 mg total) by mouth daily.     azelastine (OPTIVAR) 0.05 % ophthalmic solution Place 1 drop into both eyes daily as needed (itching).     B Complex Vitamins (B COMPLEX 1 PO) Take 1 tablet by mouth at bedtime.     candesartan (ATACAND) 4 MG tablet TAKE 1 TABLET BY MOUTH EVERY DAY 90 tablet 2   cholecalciferol (VITAMIN D) 1000 units tablet Take 1,000 Units by mouth 2 (two) times daily.     Coenzyme Q10 (CO Q 10 PO) Take 1 capsule by mouth daily.     diclofenac sodium (VOLTAREN) 1 % GEL Apply 2-4 g topically daily as needed (knee pain).     Fluticasone-Umeclidin-Vilant (TRELEGY ELLIPTA) 100-62.5-25 MCG/ACT AEPB Inhale 1 puff into the lungs daily. 60 each 2   levalbuterol (XOPENEX) 1.25 MG/3ML nebulizer solution Take 1.25 mg by nebulization every 8 (eight) hours as needed for wheezing or shortness of breath. 72 mL 11   levocetirizine (XYZAL) 5 MG tablet Take 1 tablet (5 mg total) by mouth every evening. 90 tablet 3   meclizine (ANTIVERT) 25 MG tablet Take 25 mg by mouth 3 (three) times daily as needed for dizziness.     Menthol-Camphor (TIGER BALM ARTHRITIS RUB EX) Apply 1 application. topically at bedtime  as needed (shoulder pain).     mometasone (NASONEX) 50 MCG/ACT nasal spray Place 2 sprays into the nose every morning. 17 g 11   montelukast (SINGULAIR) 10 MG tablet Take 1 tablet (10 mg total) by mouth at bedtime. 90 tablet 3   Multiple Vitamins-Minerals (ZINC PO) Take 1 tablet by mouth at bedtime.     mupirocin ointment (BACTROBAN) 2 % Apply 1 Application topically 2 (two) times daily. Apply to nasal passages BID for 7-14 days. 22 g 0   nitroGLYCERIN (NITROSTAT) 0.4 MG SL tablet PLACE 1 TABLET UNDER THE TONGUE EVERY 5 MINUTES AS NEEDED FOR CHEST PAIN. 25 tablet 0   ondansetron (ZOFRAN-ODT) 4 MG disintegrating tablet Take 1 tablet (4 mg total) by mouth every 8 (eight)  hours as needed for nausea or vomiting. 10 tablet 0   Respiratory Therapy Supplies (NEBULIZER) DEVI 1 Device by Does not apply route 3 (three) times daily as needed. 1 each 5   traMADol (ULTRAM) 50 MG tablet Take 1 tablet (50 mg total) by mouth every 6 (six) hours as needed for moderate pain. 12 tablet 0   atorvastatin (LIPITOR) 80 MG tablet TAKE 1 TABLET BY MOUTH EVERY DAY IN THE EVENING 90 tablet 0   metoprolol succinate (TOPROL-XL) 50 MG 24 hr tablet TAKE 1 TABLET BY MOUTH EVERY DAY WITH OR IMMEDIATELY FOLLOWING A MEAL 30 tablet 0   No facility-administered medications prior to visit.     Allergies:   Compazine [prochlorperazine edisylate], Advil [ibuprofen], and Aspirin   Social History   Socioeconomic History   Marital status: Married    Spouse name: Brad   Number of children: Not on file   Years of education: 16   Highest education level: Bachelor's degree (e.g., BA, AB, BS)  Occupational History   Not on file  Tobacco Use   Smoking status: Never   Smokeless tobacco: Never  Vaping Use   Vaping status: Never Used  Substance and Sexual Activity   Alcohol use: Not Currently   Drug use: No   Sexual activity: Yes    Partners: Male  Other Topics Concern   Not on file  Social History Narrative   Marital  status/children/pets: married   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[employer], Engineer, agricultural:      -smoke alarm in the home:Yes     - wears seatbelt: Yes     - Feels safe in their relationships: Yes   Social Determinants of Health   Financial Resource Strain: Low Risk  (01/01/2018)   Overall Financial Resource Strain (CARDIA)    Difficulty of Paying Living Expenses: Not hard at all  Food Insecurity: No Food Insecurity (01/01/2018)   Hunger Vital Sign    Worried About Running Out of Food in the Last Year: Never true    Ran Out of Food in the Last Year: Never true  Transportation Needs: No Transportation Needs (01/01/2018)   PRAPARE - Administrator, Civil Service (Medical): No    Lack of Transportation (Non-Medical): No  Physical Activity: Inactive (01/01/2018)   Exercise Vital Sign    Days of Exercise per Week: 0 days    Minutes of Exercise per Session: 0 min  Stress: No Stress Concern Present (01/01/2018)   [employer] of Occupational Health - Occupational Stress Questionnaire    Feeling of Stress : Only a little  Social Connections: Not on file     Family History:  The patient's family history includes Alcohol abuse in her father; Arthritis in her father, maternal grandfather, maternal grandmother, mother, and paternal grandmother; Asthma in her mother; Breast cancer (age of onset: 72) in her maternal grandmother; COPD in her father; Cancer in her mother; Diabetes in her maternal grandfather and paternal grandmother; Hearing loss in her father, maternal grandmother, mother, paternal grandfather, and paternal grandmother; Hypertension in her father, maternal grandfather, and mother; Learning disabilities in her paternal grandfather; Lymphoma in her mother; Miscarriages / Stillbirths in her mother and sister; Stroke in her maternal grandfather.   ROS:   Please see the history of present illness.    ROS All other systems reviewed and are negative.   PHYSICAL EXAM:   VS:  BP  134/82 (BP Location: Left Arm, Patient Position: Sitting, Cuff Size: Large)  Pulse (!) 59   Ht 5\' 9"  (1.753 m)   Wt 292 lb (132.5 kg)   SpO2 96%   BMI 43.12 kg/m    GEN: Well nourished, obesity, in no acute distress  HEENT: normal  Neck: no JVD, carotid bruits, or masses Cardiac: RRR; no murmur  No rubs, or gallops,no edema  Respiratory:  clear to auscultation bilaterally, normal work of breathing, no wheezing GI: soft, nontender, nondistended, + BS MS: no deformity or atrophy  Skin: warm and dry, no rash Neuro:  Alert and Oriented x 3, Strength and sensation are intact Psych: euthymic mood, full affect  Wt Readings from Last 3 Encounters:  02/08/23 292 lb (132.5 kg)  01/30/23 284 lb (128.8 kg)  11/24/22 284 lb (128.8 kg)      Studies/Labs Reviewed:   EKG Interpretation Date/Time:  Thursday February 08 2023 07:58:57 EDT Ventricular Rate:  59 PR Interval:  180 QRS Duration:  100 QT Interval:  414 QTC Calculation: 409 R Axis:   1  Text Interpretation: Sinus bradycardia Low voltage QRS RSR' or QR pattern in V1 suggests right ventricular conduction delay When compared with ECG of 30-Oct-2022 14:13, No significant change was found Confirmed by [place], Annaleigh Steinmeyer [phone]) on 02/08/2023 8:07:27 AM    Echo 12/13/22: IMPRESSIONS     1. Left ventricular ejection fraction, by estimation, is 55 to 60%. The  left ventricle has normal function. The left ventricle has no regional  wall motion abnormalities. Left ventricular diastolic parameters were  normal.   2. Right ventricular systolic function is normal. The right ventricular  size is normal. There is normal pulmonary artery systolic pressure.   3. There is a trace pericardial effusion. There is no evidence of cardiac  tamponade.   4. The mitral valve is normal in structure. No evidence of mitral valve  regurgitation.   5. The aortic valve is normal in structure. Aortic valve regurgitation is  not visualized.   6. The inferior  vena cava is normal in size with greater than 50%  respiratory variability, suggesting right atrial pressure of 3 mmHg.   Comparison(s): No prior Echocardiogram.    Recent Labs: 03/24/2022: ALT 20; Hemoglobin 13.2; Platelets 223.0; TSH 1.56 10/27/2022: BUN 19; Creatinine, Ser 0.61; Potassium 3.8; Sodium 137   Lipid Panel    Component Value Date/Time   CHOL 124 03/24/2022 1038   CHOL 113 06/23/2019 1120   TRIG 61.0 03/24/2022 1038   HDL 48.70 03/24/2022 1038   HDL 45 06/23/2019 1120   CHOLHDL 3 03/24/2022 1038   VLDL 12.2 03/24/2022 1038   LDLCALC 63 03/24/2022 1038   LDLCALC 56 06/23/2019 1120   LDLDIRECT 59.0 03/14/2021 1137    Additional studies/ records that were reviewed today include:   Cath: 11/01/17   Ost 1st Mrg lesion is 40% stenosed. Prox LAD lesion is 95% stenosed. Post intervention, there is a 0% residual stenosis. A drug-eluting stent was successfully placed using a STENT RESOLUTE ONYX 4.5X12. There is mild left ventricular systolic dysfunction. LV end diastolic pressure is mildly elevated. The left ventricular ejection fraction is 50-55% by visual estimate.   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 Mildly impaired LV function. EF 45-50% 3. Mildly elevated LVEDP 4. Successful PCI of the proximal LAD with DES x 1     Recommend uninterrupted dual antiplatelet therapy with Aspirin 81mg  daily and Ticagrelor 90mg  twice daily for a minimum of 12 months (ACS - Class I recommendation).   2D echo 11/01/17 Study Conclusions   -  Left ventricle: The cavity size was normal. There was mild focal   basal hypertrophy of the septum. Systolic function was mildly to   moderately reduced. The estimated ejection fraction was in the   range of 40% to 45%. Wall motion abnormalities noted below. The   study is indeterminate for the evaluation of LV diastolic   function. Acoustic contrast opacification revealed no evidence   ofthrombus. - Regional wall motion  abnormality: Akinesis of the apical anterior   and apical myocardium; hypokinesis of the mid anterior, mid   anteroseptal, apical septal, and apical lateral myocardium. - Aortic valve: There was no regurgitation. - Mitral valve: Structurally normal valve. No echocardiographic   evidence for prolapse. Transvalvular velocity was within the   normal range. There was no evidence for stenosis. There was no   regurgitation. - Tricuspid valve: There was no significant regurgitation. - Pulmonic valve: There was no significant regurgitation.   Impressions:   - -LV EF mild to moderately reduced with focal wall motion   abnormalities. Hypokinesis of anterior and anteroseptal walls and   apex. Echo contrast used to better evaluate wall motion and   exclude LV thrombus.   -No evidence of mitral valve prolapse or regurgitation.   -Technically difficult study. Right sided structures not well   visualized but appear grossly normal.       ASSESSMENT:    1. Coronary artery disease involving native coronary artery of native heart without angina pectoris   2. Essential hypertension   3. Mixed hyperlipidemia       PLAN:  In order of problems listed above:  CAD: s/p  anterior MI 11/01/17, underwent DES to LAD.  No other residual disease. she is asymptomatic. Continue ASA.  Continue  high-dose statin.  On beta blocker and ARB. Echo with normal LV function  Hyperlipidemia: Continue Lipitor. Last LDL 59. Due for complete lab work with PCP in Dec.   Hypertension: Blood pressure well controlled.    4.   Obesity. Continue efforts at weight loss. Low carb diet. Needs to increase aerobic activity.   Follow up in one year.   Medication Adjustments/Labs and Tests Ordered: Current medicines are reviewed at length with the patient today.  Concerns regarding medicines are outlined above.  Medication changes, Labs and Tests ordered today are listed in the Patient Instructions below. There are no Patient  Instructions on file for this visit.   Signed, Akshith Moncus [place], MD  02/08/2023 8:12 AM    [facility] [address] [place], [place], [place]  [address] Phone: [phone]; Fax: [phone]

## 2023-02-08 ENCOUNTER — Encounter: Payer: Self-pay | Admitting: Cardiology

## 2023-02-08 ENCOUNTER — Ambulatory Visit: Payer: BC Managed Care – PPO | Attending: Cardiology | Admitting: Cardiology

## 2023-02-08 VITALS — BP 134/82 | HR 59 | Ht 69.0 in | Wt 292.0 lb

## 2023-02-08 DIAGNOSIS — I10 Essential (primary) hypertension: Secondary | ICD-10-CM

## 2023-02-08 DIAGNOSIS — E782 Mixed hyperlipidemia: Secondary | ICD-10-CM

## 2023-02-08 DIAGNOSIS — I2510 Atherosclerotic heart disease of native coronary artery without angina pectoris: Secondary | ICD-10-CM | POA: Diagnosis not present

## 2023-02-08 MED ORDER — ATORVASTATIN CALCIUM 80 MG PO TABS: 80.0000 mg | ORAL_TABLET | Freq: Every day | ORAL | 3 refills | Status: DC

## 2023-02-08 MED ORDER — METOPROLOL SUCCINATE ER 50 MG PO TB24: 50.0000 mg | ORAL_TABLET | Freq: Every day | ORAL | 3 refills | Status: DC

## 2023-02-08 NOTE — Patient Instructions (Signed)
Medication Instructions:  Continue same medications *If you need a refill on your cardiac medications before your next appointment, please call your pharmacy*   Lab Work: None ordered   Testing/Procedures: None ordered   Follow-Up: At [facility], you and your health needs are our priority.  As part of our continuing mission to provide you with exceptional heart care, we have created designated Provider Care Teams.  These Care Teams include your primary Cardiologist (physician) and Advanced Practice Providers (APPs -  Physician Assistants and Nurse Practitioners) who all work together to provide you with the care you need, when you need it.  We recommend signing up for the patient portal called "MyChart".  Sign up information is provided on this After Visit Summary.  MyChart is used to connect with patients for Virtual Visits (Telemedicine).  Patients are able to view lab/test results, encounter notes, upcoming appointments, etc.  Non-urgent messages can be sent to your provider as well.   To learn more about what you can do with MyChart, go to [email or web address].    Your next appointment:  1 year   Call in June to schedule Oct appointment     Provider:  Dr.Jordan

## 2023-03-29 ENCOUNTER — Encounter: Payer: Self-pay | Admitting: Family Medicine

## 2023-03-29 ENCOUNTER — Ambulatory Visit (INDEPENDENT_AMBULATORY_CARE_PROVIDER_SITE_OTHER): Payer: BC Managed Care – PPO | Admitting: Family Medicine

## 2023-03-29 VITALS — BP 112/70 | HR 62 | Temp 97.5°F | Ht 68.0 in | Wt 291.8 lb

## 2023-03-29 DIAGNOSIS — Z131 Encounter for screening for diabetes mellitus: Secondary | ICD-10-CM | POA: Diagnosis not present

## 2023-03-29 DIAGNOSIS — M8589 Other specified disorders of bone density and structure, multiple sites: Secondary | ICD-10-CM | POA: Diagnosis not present

## 2023-03-29 DIAGNOSIS — J4540 Moderate persistent asthma, uncomplicated: Secondary | ICD-10-CM | POA: Diagnosis not present

## 2023-03-29 DIAGNOSIS — E213 Hyperparathyroidism, unspecified: Secondary | ICD-10-CM | POA: Diagnosis not present

## 2023-03-29 DIAGNOSIS — J4541 Moderate persistent asthma with (acute) exacerbation: Secondary | ICD-10-CM

## 2023-03-29 DIAGNOSIS — E559 Vitamin D deficiency, unspecified: Secondary | ICD-10-CM | POA: Diagnosis not present

## 2023-03-29 DIAGNOSIS — E782 Mixed hyperlipidemia: Secondary | ICD-10-CM | POA: Diagnosis not present

## 2023-03-29 DIAGNOSIS — I10 Essential (primary) hypertension: Secondary | ICD-10-CM

## 2023-03-29 DIAGNOSIS — Z0000 Encounter for general adult medical examination without abnormal findings: Secondary | ICD-10-CM

## 2023-03-29 DIAGNOSIS — Z1231 Encounter for screening mammogram for malignant neoplasm of breast: Secondary | ICD-10-CM

## 2023-03-29 DIAGNOSIS — Z23 Encounter for immunization: Secondary | ICD-10-CM | POA: Diagnosis not present

## 2023-03-29 DIAGNOSIS — H8110 Benign paroxysmal vertigo, unspecified ear: Secondary | ICD-10-CM

## 2023-03-29 LAB — COMPREHENSIVE METABOLIC PANEL
ALT: 17 U/L (ref 0–35)
AST: 16 U/L (ref 0–37)
Albumin: 3.9 g/dL (ref 3.5–5.2)
Alkaline Phosphatase: 57 U/L (ref 39–117)
BUN: 16 mg/dL (ref 6–23)
CO2: 27 meq/L (ref 19–32)
Calcium: 8.7 mg/dL (ref 8.4–10.5)
Chloride: 108 meq/L (ref 96–112)
Creatinine, Ser: 0.67 mg/dL (ref 0.40–1.20)
GFR: 90 mL/min (ref >60.00)
Glucose, Bld: 86 mg/dL (ref 70–99)
Potassium: 4 meq/L (ref 3.5–5.1)
Sodium: 141 meq/L (ref 135–145)
Total Bilirubin: 0.5 mg/dL (ref 0.2–1.2)
Total Protein: 6.1 g/dL (ref 6.0–8.3)

## 2023-03-29 LAB — CBC
HCT: 42.6 % (ref 36.0–46.0)
Hemoglobin: 13.8 g/dL (ref 12.0–15.0)
MCHC: 32.4 g/dL (ref 30.0–36.0)
MCV: 94.7 fL (ref 78.0–100.0)
Platelets: 207 10*3/uL (ref 150.0–400.0)
RBC: 4.5 Mil/uL (ref 3.87–5.11)
RDW: 13.4 % (ref 11.5–15.5)
WBC: 4.2 10*3/uL (ref 4.0–10.5)

## 2023-03-29 LAB — TSH: TSH: 2.58 u[IU]/mL (ref 0.35–5.50)

## 2023-03-29 LAB — LIPID PANEL
Cholesterol: 119 mg/dL (ref 0–200)
HDL: 48.2 mg/dL (ref >39.00)
LDL Cholesterol: 61 mg/dL (ref 0–99)
NonHDL: 70.4
Total CHOL/HDL Ratio: 2
Triglycerides: 47 mg/dL (ref 0.0–149.0)
VLDL: 9.4 mg/dL (ref 0.0–40.0)

## 2023-03-29 LAB — VITAMIN D 25 HYDROXY (VIT D DEFICIENCY, FRACTURES): VITD: 35.93 ng/mL (ref 30.00–100.00)

## 2023-03-29 LAB — HEMOGLOBIN A1C: Hgb A1c MFr Bld: 5.6 % (ref 4.6–6.5)

## 2023-03-29 MED ORDER — MOMETASONE FUROATE 50 MCG/ACT NA SUSP: 2.0000 | Freq: Every morning | NASAL | 11 refills | Status: DC

## 2023-03-29 MED ORDER — MONTELUKAST SODIUM 10 MG PO TABS: 10.0000 mg | ORAL_TABLET | Freq: Every day | ORAL | 3 refills | Status: DC

## 2023-03-29 MED ORDER — LEVOCETIRIZINE DIHYDROCHLORIDE 5 MG PO TABS: 5.0000 mg | ORAL_TABLET | Freq: Every evening | ORAL | 3 refills | Status: DC

## 2023-03-29 MED ORDER — MECLIZINE HCL 25 MG PO TABS: 25.0000 mg | ORAL_TABLET | Freq: Three times a day (TID) | ORAL | 5 refills | Status: DC | PRN

## 2023-03-29 NOTE — Progress Notes (Signed)
Patient ID: Alyssa Phillips, female  DOB: 03-24-1956, 67 y.o.   MRN: [id number] Patient Care Team    Relationship Specialty Notifications Start End  Natalia Leatherwood, DO PCP - General Family Medicine  12/04/17   [place], Peter M, MD PCP - Cardiology Cardiology  10/30/22   Dorena Cookey, MD (Inactive) Consulting Physician Gastroenterology  12/04/17   Ollen Gross, MD Consulting Physician Orthopedic Surgery  12/04/17   Sidney Ace, MD Referring Physician Allergy  12/12/19   [place], Peter M, MD Consulting Physician Cardiology  12/12/19   Shamleffer, Konrad Dolores, MD Consulting Physician Endocrinology  12/12/19     Chief Complaint  Patient presents with   Annual Exam    Pt is fasting    Subjective: Alyssa Phillips is a 67 y.o.  Female  present for CPE and combined chronic condition management appointment. All past medical history, surgical history, allergies, family history, immunizations, medications and social history were updated in the electronic medical record today. All recent labs, ED visits and hospitalizations within the last year were reviewed.  Health maintenance: Colonoscopy: completed 06/08/2016 , by Dr. Madilyn Fireman, resutls normal. follow up 10 year.  Due 2028 Mammogram: completed:07/06/2022, birads 1.  BC-Gso> ordered today for 2025 Immunizations: tdap UTD 11/2018, Influenza completed today (encouraged yearly), PNA completed, shingles completed Infectious disease screening: HIV and Hep C completed DEXA: UTD 09/2021, osteopenia -2.0> BC-GSO ordered by Endo Patient has a Dental home. Hospitalizations/ED visits: reviewed  Moderate persistent asthma without complication Patient reports her asthma and allergies have been okay.  Asthma has not really been well-controlled since insurance stopped paying for it Symbicort.  She has tried the Trelegy, but does not really like that medication.  She is compliant  albuterol as needed, Xyzal nightly, Singulair nightly and  Nasonex.   Vertigo: She is prescribed meclizine and Zofran for her vertigo.  She has now seen ENT and vestibular rehab.  Her vertigo was from December 2021 to May 2022.  It then resolved with with vestibular rehab.  With occasional reoccurrence.  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 [place].  She was found to have severe single-vessel disease but 95% proximal LAD treated with DES x1.  EF 45 to 50%.  Medications are managed by cardiology.   Pt reports compliance with  ASA 81, Lipitor, metoprolol, candesartan.  Patient denies chest pain, shortness of breath, dizziness or lower extremity edema.  Diet: has changed to a heart healthy diet.  RF: HTN, HLD, HD, morbid obesity, CAD/STEMI       03/29/2023    8:14 AM 08/31/2022    9:51 AM 07/04/2022    1:06 PM 03/24/2022   10:40 AM 03/14/2021   11:10 AM  Depression screen PHQ 2/9  Decreased Interest 0 0 0 0 0  Down, Depressed, Hopeless 0 0 0 0 0  PHQ - 2 Score 0 0 0 0 0      12/11/2019    9:08 AM  GAD 7 : Generalized Anxiety Score  Nervous, Anxious, on Edge 0  Control/stop worrying 0  Worry too much - different things 0  Trouble relaxing 0  Restless 0  Easily annoyed or irritable 0  Afraid - awful might happen 0  Total GAD 7 Score 0     Immunization History  Administered Date(s) Administered   Fluad Quad(high Dose 65+) 02/08/2021, 03/24/2022   Fluad Trivalent(High Dose 65+) 03/29/2023   Influenza  Split 05/19/2008, 02/01/2010, 01/31/2011, 01/30/2012   Influenza, High Dose Seasonal PF 01/29/2018, 02/10/2019   Influenza,inj,Quad PF,6+ Mos 03/10/2015, 12/22/2016, 12/04/2017, 12/09/2018, 12/11/2019   Influenza,inj,Quad PF,6-35 Mos 05/15/2016   Influenza,inj,quad, With Preservative 01/01/2014, 12/18/2016   Influenza-Unspecified 05/15/2016   PFIZER(Purple Top)SARS-COV-2 Vaccination 06/14/2019, 07/09/2019, 11/03/2019,  01/20/2020   PNEUMOCOCCAL CONJUGATE-20 03/24/2022   Pneumococcal Polysaccharide-23 12/04/2017, 01/29/2018   Respiratory Syncytial Virus Vaccine,Recomb Aduvanted(Arexvy) 04/27/2022   Tdap 05/13/2010, 12/09/2018   Zoster Recombinant(Shingrix) 12/11/2019, 08/01/2021    Past Medical History:  Diagnosis Date   Allergic rhinitis    Arthritis    Osteoarthrits-knees-hx. RTKA   Asthma    environmental agents induced asthma   Carpal tunnel syndrome of left wrist 02/19/2020   Carpal tunnel syndrome of right wrist 02/19/2020   Coronary artery disease    Family history of adverse reaction to anesthesia    daughter very sensitive to anesthesia   GERD (gastroesophageal reflux disease)    Heart murmur    hx. mitral valve proplapse-mostly asymptomatic-->not apprectiaed n echo   Hepatic steatosis 03/05/2008   [place]   History of kidney stones    Hyperlipidemia    Hypertension    Morbid obesity due to excess calories (HCC)    MRSA infection 08/2022   Pain in joint of left shoulder 06/12/2017   Pain in left foot 04/04/2018   Pain of joint of left ankle and foot 12/18/2019   Pneumonia    Primary hyperparathyroidism (HCC)    Status post parathyroidectomy 12/27/2022   STEMI (ST elevation myocardial infarction) (HCC) 10/2017   LAD treated with DESx1   UTI (urinary tract infection)    Vitamin D deficiency    Allergies  Allergen Reactions   Compazine [Prochlorperazine Edisylate] Other (See Comments)    Eyes rolled back, tongue curled up   Advil [Ibuprofen] Hives   Aspirin Other (See Comments)    HAS to be EC aspirin!!   Past Surgical History:  Procedure Laterality Date   5th finger surgery Right 1990   unknown injury   CARDIAC CATHETERIZATION     CARPAL TUNNEL RELEASE  08/2020   CHOLECYSTECTOMY     COLONOSCOPY WITH PROPOFOL N/A 06/08/2016   Procedure: COLONOSCOPY WITH PROPOFOL;  Surgeon: Dorena Cookey, MD;  Location: [facility] ENDOSCOPY;  Service: Endoscopy;  Laterality: N/A;   CORONARY STENT  INTERVENTION N/A 11/01/2017   Procedure: CORONARY STENT INTERVENTION;  Surgeon: Sw[place]il[place], Peter M, MD;  Location: [facility] INVASIVE CV LAB;  Service: Cardiovascular;  Laterality: N/A;  Resolute Onyx 4.5 mm x12 mm   CORONARY/GRAFT ACUTE MI REVASCULARIZATION N/A 11/01/2017   Procedure: Coronary/Graft Acute MI Revascularization;  Surgeon: [place], Peter M, MD;  Location: [facility] INVASIVE CV LAB;  Service: Cardiovascular;  Laterality: N/A;   CYSTOSCOPY W/ URETERAL STENT PLACEMENT Left 02/06/2022   Procedure: CYSTOSCOPY WITH RETROGRADE PYELOGRAM/URETERAL STENT PLACEMENT;  Surgeon: Despina Arias, MD;  Location: [facility] ORS;  Service: Urology;  Laterality: Left;   CYSTOSCOPY/URETEROSCOPY/HOLMIUM LASER/STENT PLACEMENT Left 02/22/2022   Procedure: LEFT URETEROSCOPY/HOLMIUM LASER/STENT EXCHANGE;  Surgeon: Despina Arias, MD;  Location: [facility] ORS;  Service: Urology;  Laterality: Left;  60 MINUTES NEEDED FOR CASE   I & D EXTREMITY Left 06/30/2021   Procedure: IRRIGATION AND DEBRIDEMENT left elbow;  Surgeon: Gomez Cleverly, MD;  Location: [facility] OR;  Service: Orthopedics;  Laterality: Left;   KNEE ARTHROSCOPY  05/02/2012   x2 right/ x1 left   LEFT HEART CATH AND CORONARY ANGIOGRAPHY N/A 11/01/2017   Procedure: LEFT HEART CATH AND CORONARY ANGIOGRAPHY;  Surgeon: [place],  Demetria Pore, MD;  Location: [facility] INVASIVE CV LAB;  Service: Cardiovascular;  Laterality: N/A;   PARATHYROIDECTOMY Right 11/24/2022   Procedure: RIGHT PARATHYROIDECTOMY;  Surgeon: Darnell Level, MD;  Location: [facility] ORS;  Service: General;  Laterality: Right;  89 2ND SCRUB PERSON   REPLACEMENT TOTAL KNEE Right 10/2010   ROOT CANAL     TOTAL KNEE ARTHROPLASTY  05/10/2012   Procedure: TOTAL KNEE ARTHROPLASTY;  Surgeon: Loanne Drilling, MD;  Location: [facility] ORS;  Service: Orthopedics;  Laterality: Left;   TUBAL LIGATION     UMBILICAL HERNIA REPAIR     Family History  Problem Relation Age of Onset   Hypertension Mother    Arthritis Mother    Lymphoma Mother    Asthma Mother     Cancer Mother    Hearing loss Mother    Miscarriages / [place] Mother    COPD Father    Alcohol abuse Father    Arthritis Father    Hearing loss Father    Hypertension Father    Miscarriages / [place] Sister    Arthritis Maternal Grandmother    Hearing loss Maternal Grandmother    Breast cancer Maternal Grandmother 76   Arthritis Maternal Grandfather    Diabetes Maternal Grandfather    Hypertension Maternal Grandfather    Stroke Maternal Grandfather    Arthritis Paternal Grandmother    Diabetes Paternal Grandmother    Hearing loss Paternal Grandmother    Hearing loss Paternal Grandfather    Learning disabilities Paternal Grandfather    Social History   Social History Narrative   Marital status/children/pets: married   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        Medication List        Accurate as of March 29, 2023 11:59 PM. If you have any questions, ask your nurse or doctor.          STOP taking these medications    acetaminophen 500 MG tablet Commonly known as: TYLENOL Stopped by: Felix Pacini   mupirocin ointment 2 % Commonly known as: BACTROBAN Stopped by: Felix Pacini   ondansetron 4 MG disintegrating tablet Commonly known as: ZOFRAN-ODT Stopped by: Felix Pacini   TIGER BALM ARTHRITIS RUB EX Stopped by: Felix Pacini   traMADol 50 MG tablet Commonly known as: ULTRAM Stopped by: Felix Pacini   Trelegy Ellipta 100-62.5-25 MCG/ACT Aepb Generic drug: Fluticasone-Umeclidin-Vilant Stopped by: Felix Pacini       TAKE these medications    aspirin EC 81 MG tablet Take 1 tablet (81 mg  total) by mouth daily.   atorvastatin 80 MG tablet Commonly known as: LIPITOR Take 1 tablet (80 mg total) by mouth daily.   azelastine 0.05 % ophthalmic solution Commonly known as: OPTIVAR Place 1 drop into both eyes daily as needed (itching).   B COMPLEX 1 PO Take 1 tablet by mouth at bedtime.   Budeson-Glycopyrrol-Formoterol 160-9-4.8 MCG/ACT Aero Inhale 1 puff into the lungs 2 (two) times daily. Started by: Felix Pacini   candesartan 4 MG tablet Commonly known as: ATACAND  TAKE 1 TABLET BY MOUTH EVERY DAY   cholecalciferol 1000 units tablet Commonly known as: VITAMIN D Take 1,000 Units by mouth 2 (two) times daily.   CO Q 10 PO Take 1 capsule by mouth daily.   diclofenac sodium 1 % Gel Commonly known as: VOLTAREN Apply 2-4 g topically daily as needed (knee pain).   levalbuterol 1.25 MG/3ML nebulizer solution Commonly known as: XOPENEX Take 1.25 mg by nebulization every 8 (eight) hours as needed for wheezing or shortness of breath.   levocetirizine 5 MG tablet Commonly known as: XYZAL Take 1 tablet (5 mg total) by mouth every evening.   meclizine 25 MG tablet Commonly known as: ANTIVERT Take 1 tablet (25 mg total) by mouth 3 (three) times daily as needed for dizziness.   metoprolol succinate 50 MG 24 hr tablet Commonly known as: TOPROL-XL Take 1 tablet (50 mg total) by mouth daily. Take with or immediately following a meal.   mometasone 50 MCG/ACT nasal spray Commonly known as: NASONEX Place 2 sprays into the nose every morning.   montelukast 10 MG tablet Commonly known as: SINGULAIR Take 1 tablet (10 mg total) by mouth at bedtime.   Nebulizer Devi 1 Device by Does not apply route 3 (three) times daily as needed.   nitroGLYCERIN 0.4 MG SL tablet Commonly known as: NITROSTAT PLACE 1 TABLET UNDER THE TONGUE EVERY 5 MINUTES AS NEEDED FOR CHEST PAIN.   VITAMIN C PO Take 1 tablet by mouth daily.   ZINC PO Take 1 tablet by mouth at bedtime.         All past medical history, surgical history, allergies, family history, immunizations andmedications were updated in the EMR today and reviewed under the history and medication portions of their EMR.      ROS: 14 pt review of systems performed and negative (unless mentioned in an HPI)  Objective: BP 112/70   Pulse 62   Temp (!) 97.5 F (36.4 C)   Ht 5\' 8"  (1.727 m)   Wt 291 lb 12.8 oz (132.4 kg)   SpO2 98%   BMI 44.37 kg/m  Physical Exam Vitals and nursing note reviewed.  Constitutional:      General: She is not in acute distress.    Appearance: Normal appearance. She is obese. She is not ill-appearing or toxic-appearing.  HENT:     Head: Normocephalic and atraumatic.     Right Ear: Tympanic membrane, ear canal and external ear normal. There is no impacted cerumen.     Left Ear: Tympanic membrane, ear canal and external ear normal. There is no impacted cerumen.     Nose: No congestion or rhinorrhea.     Mouth/Throat:     Mouth: Mucous membranes are moist.     Pharynx: Oropharynx is clear. No oropharyngeal exudate or posterior oropharyngeal erythema.  Eyes:     General: No scleral icterus.       Right eye: No discharge.        Left eye: No discharge.     Extraocular Movements: Extraocular movements intact.     Conjunctiva/sclera: Conjunctivae normal.     Pupils: Pupils are equal, round, and reactive to light.  Cardiovascular:     Rate and Rhythm: Normal rate and regular rhythm.     Pulses: Normal pulses.     Heart sounds: Normal heart sounds. No murmur heard.    No friction rub. No gallop.  Pulmonary:     Effort: Pulmonary effort is normal. No respiratory distress.  Breath sounds: Normal breath sounds. No stridor. No wheezing, rhonchi or rales.  Chest:     Chest wall: No tenderness.  Abdominal:     General: Abdomen is flat. Bowel sounds are normal. There is no distension.     Palpations: Abdomen is soft. There is no mass.     Tenderness: There is no abdominal  tenderness. There is no right CVA tenderness, left CVA tenderness, guarding or rebound.     Hernia: No hernia is present.  Musculoskeletal:        General: No swelling, tenderness or deformity. Normal range of motion.     Cervical back: Normal range of motion and neck supple. No rigidity or tenderness.     Right lower leg: No edema.     Left lower leg: No edema.  Lymphadenopathy:     Cervical: No cervical adenopathy.  Skin:    General: Skin is warm and dry.     Coloration: Skin is not jaundiced or pale.     Findings: No bruising, erythema, lesion or rash.  Neurological:     General: No focal deficit present.     Mental Status: She is alert and oriented to person, place, and time. Mental status is at baseline.     Cranial Nerves: No cranial nerve deficit.     Sensory: No sensory deficit.     Motor: No weakness.     Coordination: Coordination normal.     Gait: Gait normal.     Deep Tendon Reflexes: Reflexes normal.  Psychiatric:        Mood and Affect: Mood normal.        Behavior: Behavior normal.        Thought Content: Thought content normal.        Judgment: Judgment normal.     No results found.  Assessment/plan: LOREINE LICAUSI is a 67 y.o. female present for annual physical exam with combined chronic condition management appointment Hypertension/mixed hyperlipidemia/STEMI involving left anterior descending coronary artery (HCC)/morbid obesity Routine diet and exercise. Continue follow-ups with cardiology who manages all medications for cardiac condition-metoprolol, candesartan, Lipitor, baby aspirin, nitro CBC, CMP, TSH and lipids, A1c collected today  Moderate persistent asthma without complication Stable Continue Xyzal Continue Singulair Trial of Breztri 1 puff twice daily Prior scripts: Symbicort, Breo, Trelegy, Wixela Continue allergy follow-ups  Hyperparathyroidism (HCC)/hypercalcemia/status post parathyroidectomy 11/2022 -Continue routine follow-ups per  endocrinology - PTH, calcium and vitamin D collected today   Routine general medical examination at a health care facility Patient was encouraged to exercise greater than 150 minutes a week. Patient was encouraged to choose a diet filled with fresh fruits and vegetables, and lean meats. AVS provided to patient today for education/recommendation on gender specific health and safety maintenance. Colonoscopy: completed 06/08/2016 , by Dr. Madilyn Fireman, resutls normal. follow up 10 year.  Due 2028 Mammogram: completed:07/06/2022, birads 1.  BC-Gso> ordered today for 2025 Immunizations: tdap UTD 11/2018, Influenza completed today (encouraged yearly), PNA completed, shingles completed Infectious disease screening: HIV and Hep C completed DEXA: UTD 09/2021, osteopenia -2.0> BC-[place] ordered by Endo  Return in about 1 year (around 03/29/2024) for cpe (20 min), Routine chronic condition follow-up.   Orders Placed This Encounter  Procedures   MM 3D SCREENING MAMMOGRAM BILATERAL BREAST   Flu Vaccine Trivalent High Dose (Fluad)   CBC   Comprehensive metabolic panel   Hemoglobin A1c   TSH   Lipid panel   Vitamin D (25 hydroxy)   PTH, Intact and Calcium  Meds ordered this encounter  Medications   levocetirizine (XYZAL) 5 MG tablet    Sig: Take 1 tablet (5 mg total) by mouth every evening.    Dispense:  90 tablet    Refill:  3   mometasone (NASONEX) 50 MCG/ACT nasal spray    Sig: Place 2 sprays into the nose every morning.    Dispense:  17 g    Refill:  11   montelukast (SINGULAIR) 10 MG tablet    Sig: Take 1 tablet (10 mg total) by mouth at bedtime.    Dispense:  90 tablet    Refill:  3   meclizine (ANTIVERT) 25 MG tablet    Sig: Take 1 tablet (25 mg total) by mouth 3 (three) times daily as needed for dizziness.    Dispense:  30 tablet    Refill:  5   Budeson-Glycopyrrol-Formoterol 160-9-4.8 MCG/ACT AERO    Sig: Inhale 1 puff into the lungs 2 (two) times daily.    Dispense:  10.7 g    Refill:   11   Referral Orders  No referral(s) requested today     Electronically signed by: Felix Pacini, DO [facility] [place]

## 2023-03-29 NOTE — Patient Instructions (Addendum)
Return in about 1 year (around 03/29/2024) for cpe (20 min), Routine chronic condition follow-up.        Great to see you today.  I have refilled the medication(s) we provide.   If labs were collected or images ordered, we will inform you of  results once we have received them and reviewed. We will contact you either by echart message, or telephone call.  Please give ample time to the testing facility, and our office to run,  receive and review results. Please do not call inquiring of results, even if you can see them in your chart. We will contact you as soon as we are able. If it has been over 1 week since the test was completed, and you have not yet heard from [place], then please call us.    - echart message- for normal results that have been seen by the patient already.   - telephone call: abnormal results or if patient has not viewed results in their echart.  If a referral to a specialist was entered for you, please call us in 2 weeks if you have not heard from the specialist office to schedule.      If you are interested in weight loss counseling please make appt to discuss and bring with you 2 weeks of a food diary/log on notebook paper.  Food log is mandatory on first appt to proceed with counseling.  Weight loss counseling encompasses diet, exercise and can include  medications when appropriate and affordable.  There are routine appts for check-ins and weights to track progress and keep you on track.  Routine check-ins (in person) are also mandatory to continue with prescription refills. Check-in timeline  can range from 4 weeks to 12 weeks, depending on physician's recommendations and which step you are in of your weight loss journey.  Your BMI today is Body mass index is 44.37 kg/m.  Please check with your insurance prior to appt and ask them if they cover weight loss medications for your BMI? And if so, which medications. They may tell you some of the diabetes meds that are  used for weight loss also,  are on your formulary- but this does not mean they are covered for weight loss only.  Even if they tell with a prior auth it is covered- make sure they check to see if you personally meet criteria with your BMI.

## 2023-03-30 LAB — PTH, INTACT AND CALCIUM
Calcium: 8.9 mg/dL (ref 8.6–10.4)
PTH: 35 pg/mL (ref 16–77)

## 2023-03-30 MED ORDER — BUDESON-GLYCOPYRROL-FORMOTEROL 160-9-4.8 MCG/ACT IN AERO: 1.0000 | INHALATION_SPRAY | Freq: Two times a day (BID) | RESPIRATORY_TRACT | 11 refills | Status: DC

## 2023-04-06 DIAGNOSIS — M25531 Pain in right wrist: Secondary | ICD-10-CM | POA: Diagnosis not present

## 2023-04-25 DIAGNOSIS — S63591A Other specified sprain of right wrist, initial encounter: Secondary | ICD-10-CM | POA: Diagnosis not present

## 2023-05-02 ENCOUNTER — Ambulatory Visit: Payer: BC Managed Care – PPO | Admitting: Family Medicine

## 2023-05-12 ENCOUNTER — Other Ambulatory Visit: Payer: Self-pay | Admitting: Cardiology

## 2023-05-14 ENCOUNTER — Ambulatory Visit (INDEPENDENT_AMBULATORY_CARE_PROVIDER_SITE_OTHER): Payer: BC Managed Care – PPO | Admitting: Family Medicine

## 2023-05-14 ENCOUNTER — Ambulatory Visit: Payer: BC Managed Care – PPO | Admitting: Family Medicine

## 2023-05-14 ENCOUNTER — Encounter: Payer: Self-pay | Admitting: Family Medicine

## 2023-05-14 ENCOUNTER — Telehealth: Payer: Self-pay | Admitting: *Deleted

## 2023-05-14 VITALS — BP 102/80 | HR 70 | Temp 97.6°F | Ht 68.0 in | Wt 289.4 lb

## 2023-05-14 DIAGNOSIS — I10 Essential (primary) hypertension: Secondary | ICD-10-CM

## 2023-05-14 DIAGNOSIS — I2510 Atherosclerotic heart disease of native coronary artery without angina pectoris: Secondary | ICD-10-CM | POA: Insufficient documentation

## 2023-05-14 DIAGNOSIS — E785 Hyperlipidemia, unspecified: Secondary | ICD-10-CM

## 2023-05-14 DIAGNOSIS — S63591A Other specified sprain of right wrist, initial encounter: Secondary | ICD-10-CM | POA: Diagnosis not present

## 2023-05-14 DIAGNOSIS — M25531 Pain in right wrist: Secondary | ICD-10-CM | POA: Diagnosis not present

## 2023-05-14 MED ORDER — WEGOVY 0.25 MG/0.5ML ~~LOC~~ SOAJ: 0.2500 mg | SUBCUTANEOUS | 0 refills | Status: DC

## 2023-05-14 NOTE — Telephone Encounter (Signed)
   Name: Alyssa Phillips  DOB: [birth year]  MRN: [id number]  Primary Cardiologist: Peter Swazil[place], MD   Preoperative team, please contact this patient and set up a phone call appointment for further preoperative risk assessment. Please obtain consent and complete medication review. Thank you for your help.  I confirm that guidance regarding antiplatelet and oral anticoagulation therapy has been completed and, if necessary, noted below.  ASA ideally should be continued. Procedure is intermediate risk for bleeding. If risk of bleeding is too great, ASA can be held for 7 days and resumed post op when safe.  I also confirmed the patient resides in the state of [place]. As per [facility] telemedicine laws, the patient must reside in the state in which the provider is licensed. Silvestre Gunner, [place]   Tereso Newcomer, PA-C 05/14/2023, 4:30 PM [facility]

## 2023-05-14 NOTE — Patient Instructions (Addendum)
   Weekly net calorie calculator.   Applications for calorie counting.  My fitness pal app Low glycemic index. exercise goal of 150 minutes a week (plus warm up and cool down) of cardiovascular exercise.   adequate water consumption of at least 80 ounces a day  Exercise and healthy eating are a must to reach your goals.  You will achieve your goals if you commit to your health 100%. No shortcuts.    - Being and feeling overweight/obese is tough.    - Achieving your healthy body is tough.        - You get to choose your "tough."    You CAN do this.

## 2023-05-14 NOTE — Telephone Encounter (Signed)
   Pre-operative Risk Assessment    Patient Name: Alyssa Phillips  DOB: [birth year] MRN: [id number]   Date of last office visit: 02/08/23 DR. PETER Swaziland Date of next office visit: NONE   Request for Surgical Clearance    Procedure:  RIGHT WRIST ARTHROSCOPY WITH TFCC DEBRIDEMENT, POSSIBLE REPAIR, POSSIBLE DISTAL ULNA EXCISION  Date of Surgery:  Clearance 05/29/23                                Surgeon:  DR. Gomez Cleverly Surgeon's Group or Practice Name:  [facility] Phone number:  [phone] MEGAN DAVIS Fax number:  [phone]   Type of Clearance Requested:   - Medical  - Pharmacy:  Hold Aspirin     Type of Anesthesia:  Not Indicated   Additional requests/questions:    Elpidio Anis   05/14/2023, 1:14 PM

## 2023-05-14 NOTE — Progress Notes (Signed)
Patient ID: Alyssa Phillips, female  DOB: 12/26/1955, 67 y.o.   MRN: [id number] Patient Care Team    Relationship Specialty Notifications Start End  Natalia Leatherwood, DO PCP - General Family Medicine  12/04/17   [place], Peter M, MD PCP - Cardiology Cardiology  10/30/22   Dorena Cookey, MD (Inactive) Consulting Physician Gastroenterology  12/04/17   Ollen Gross, MD Consulting Physician Orthopedic Surgery  12/04/17   Sidney Ace, MD Referring Physician Allergy  12/12/19   [place], Peter M, MD Consulting Physician Cardiology  12/12/19   Shamleffer, Konrad Dolores, MD Consulting Physician Endocrinology  12/12/19     Chief Complaint  Patient presents with   Obesity         Subjective: Alyssa Phillips is a 68 y.o.  Female  present for discussion on weight loss/morbid obesity All past medical history, surgical history, allergies, family history, immunizations, medications and social history were updated in the electronic medical record today. All recent labs, ED visits and hospitalizations within the last year were reviewed.  Weight: 289.5 pounds BMI: 44  Patient brings with her her food logs today demonstrate multiple complex carbohydrates including potatoes, breads, pasta, crackers.  She does not count calories.  She drinks approximately 64 ounces of water a day plus unsweetened iced tea. Currently she does not exercise routinely secondary to muscle skeletal issues. She has a history of myocardial infarction/STEMI, CAD, hypertension and morbid obesity.        03/29/2023    8:14 AM 08/31/2022    9:51 AM 07/04/2022    1:06 PM 03/24/2022   10:40 AM 03/14/2021   11:10 AM  Depression screen PHQ 2/9  Decreased Interest 0 0 0 0 0  Down, Depressed, Hopeless 0 0 0 0 0  PHQ - 2 Score 0 0 0 0 0      12/11/2019    9:08 AM  GAD 7 : Generalized Anxiety Score  Nervous, Anxious, on Edge 0  Control/stop worrying 0  Worry too much - different things 0  Trouble relaxing 0  Restless 0   Easily annoyed or irritable 0  Afraid - awful might happen 0  Total GAD 7 Score 0     Immunization History  Administered Date(s) Administered   Fluad Quad(high Dose 65+) 02/08/2021, 03/24/2022   Fluad Trivalent(High Dose 65+) 03/29/2023   Influenza Split 05/19/2008, 02/01/2010, 01/31/2011, 01/30/2012   Influenza, High Dose Seasonal PF 01/29/2018, 02/10/2019   Influenza,inj,Quad PF,6+ Mos 03/10/2015, 12/22/2016, 12/04/2017, 12/09/2018, 12/11/2019   Influenza,inj,Quad PF,6-35 Mos 05/15/2016   Influenza,inj,quad, With Preservative 01/01/2014, 12/18/2016   Influenza-Unspecified 05/15/2016   PFIZER(Purple Top)SARS-COV-2 Vaccination 06/14/2019, 07/09/2019, 11/03/2019, 01/20/2020   PNEUMOCOCCAL CONJUGATE-20 03/24/2022   Pneumococcal Polysaccharide-23 12/04/2017, 01/29/2018   Respiratory Syncytial Virus Vaccine,Recomb Aduvanted(Arexvy) 04/27/2022   Tdap 05/13/2010, 12/09/2018   Zoster Recombinant(Shingrix) 12/11/2019, 08/01/2021    Past Medical History:  Diagnosis Date   Allergic rhinitis    Arthritis    Osteoarthrits-knees-hx. RTKA   Asthma    environmental agents induced asthma   Carpal tunnel syndrome of left wrist 02/19/2020   Carpal tunnel syndrome of right wrist 02/19/2020   Coronary artery disease    Family history of adverse reaction to anesthesia    daughter very sensitive to anesthesia   GERD (gastroesophageal reflux disease)    Heart murmur    hx. mitral valve proplapse-mostly asymptomatic-->not apprectiaed n echo   Hepatic steatosis 03/05/2008   [place]   History of kidney stones    Hyperlipidemia  Hypertension    Morbid obesity due to excess calories (HCC)    MRSA infection 08/2022   Pain in joint of left shoulder 06/12/2017   Pain in left foot 04/04/2018   Pain of joint of left ankle and foot 12/18/2019   Pneumonia    Primary hyperparathyroidism (HCC)    Status post parathyroidectomy 12/27/2022   STEMI (ST elevation myocardial infarction) (HCC) 10/2017   LAD  treated with DESx1   UTI (urinary tract infection)    Vitamin D deficiency    Allergies  Allergen Reactions   Compazine [Prochlorperazine Edisylate] Other (See Comments)    Eyes rolled back, tongue curled up   Advil [Ibuprofen] Hives   Aspirin Other (See Comments)    HAS to be EC aspirin!!   Past Surgical History:  Procedure Laterality Date   5th finger surgery Right 1990   unknown injury   CARDIAC CATHETERIZATION     CARPAL TUNNEL RELEASE  08/2020   CHOLECYSTECTOMY     COLONOSCOPY WITH PROPOFOL N/A 06/08/2016   Procedure: COLONOSCOPY WITH PROPOFOL;  Surgeon: Dorena Cookey, MD;  Location: [facility] ENDOSCOPY;  Service: Endoscopy;  Laterality: N/A;   CORONARY STENT INTERVENTION N/A 11/01/2017   Procedure: CORONARY STENT INTERVENTION;  Surgeon: Swaziland, Peter M, MD;  Location: [facility] INVASIVE CV LAB;  Service: Cardiovascular;  Laterality: N/A;  Resolute Onyx 4.5 mm x12 mm   CORONARY/GRAFT ACUTE MI REVASCULARIZATION N/A 11/01/2017   Procedure: Coronary/Graft Acute MI Revascularization;  Surgeon: Swaziland, Peter M, MD;  Location: [facility] INVASIVE CV LAB;  Service: Cardiovascular;  Laterality: N/A;   CYSTOSCOPY W/ URETERAL STENT PLACEMENT Left 02/06/2022   Procedure: CYSTOSCOPY WITH RETROGRADE PYELOGRAM/URETERAL STENT PLACEMENT;  Surgeon: Despina Arias, MD;  Location: [facility] ORS;  Service: Urology;  Laterality: Left;   CYSTOSCOPY/URETEROSCOPY/HOLMIUM LASER/STENT PLACEMENT Left 02/22/2022   Procedure: LEFT URETEROSCOPY/HOLMIUM LASER/STENT EXCHANGE;  Surgeon: Despina Arias, MD;  Location: [facility] ORS;  Service: Urology;  Laterality: Left;  60 MINUTES NEEDED FOR CASE   I & D EXTREMITY Left 06/30/2021   Procedure: IRRIGATION AND DEBRIDEMENT left elbow;  Surgeon: Gomez Cleverly, MD;  Location: [facility] OR;  Service: Orthopedics;  Laterality: Left;   KNEE ARTHROSCOPY  05/02/2012   x2 right/ x1 left   LEFT HEART CATH AND CORONARY ANGIOGRAPHY N/A 11/01/2017   Procedure: LEFT HEART CATH AND CORONARY ANGIOGRAPHY;  Surgeon: [place],  Peter M, MD;  [facility] CV LAB;  Service: Cardiovascular;  Laterality: N/A;   PARATHYROIDECTOMY Right 11/24/2022   Procedure: RIGHT PARATHYROIDECTOMY;  Surgeon: Darnell Level, MD;  Location: [facility] ORS;  Service: General;  Laterality: Right;  89 2ND SCRUB PERSON   REPLACEMENT TOTAL KNEE Right 10/2010   ROOT CANAL     TOTAL KNEE ARTHROPLASTY  05/10/2012   Procedure: TOTAL KNEE ARTHROPLASTY;  Surgeon: Loanne Drilling, MD;  Location: [facility] ORS;  Service: Orthopedics;  Laterality: Left;   TUBAL LIGATION     UMBILICAL HERNIA REPAIR     Family History  Problem Relation Age of Onset   Hypertension Mother    Arthritis Mother    Lymphoma Mother    Asthma Mother    Cancer Mother    Hearing loss Mother    Miscarriages / [place] Mother    COPD Father    Alcohol abuse Father    Arthritis Father    Hearing loss Father    Hypertension Father    Miscarriages / Stillbirths Sister    Arthritis Maternal Grandmother    Hearing loss Maternal Grandmother  Breast cancer Maternal Grandmother 53   Arthritis Maternal Grandfather    Diabetes Maternal Grandfather    Hypertension Maternal Grandfather    Stroke Maternal Grandfather    Arthritis Paternal Grandmother    Diabetes Paternal Grandmother    Hearing loss Paternal Grandmother    Hearing loss Paternal Grandfather    Learning disabilities Paternal Grandfather    Social History   Social History Narrative   Marital status/children/pets: married   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        Medication List        Accurate as of May 14, 2023  3:07 PM. If you have any  questions, ask your nurse or doctor.          aspirin EC 81 MG tablet Take 1 tablet (81 mg total) by mouth daily.   atorvastatin 80 MG tablet Commonly known as: LIPITOR Take 1 tablet (80 mg total) by mouth daily.   azelastine 0.05 % ophthalmic solution Commonly known as: OPTIVAR Place 1 drop into both eyes daily as needed (itching).   B COMPLEX 1 PO Take 1 tablet by mouth at bedtime.   Budeson-Glycopyrrol-Formoterol 160-9-4.8 MCG/ACT Aero Inhale 1 puff into the lungs 2 (two) times daily.   candesartan 4 MG tablet Commonly known as: ATACAND TAKE 1 TABLET BY MOUTH EVERY DAY   cholecalciferol 1000 units tablet Commonly known as: VITAMIN D Take 1,000 Units by mouth 2 (two) times daily.   CO Q 10 PO Take 1 capsule by mouth daily.   diclofenac sodium 1 % Gel Commonly known as: VOLTAREN Apply 2-4 g topically daily as needed (knee pain).   levalbuterol 1.25 MG/3ML nebulizer solution Commonly known as: XOPENEX Take 1.25 mg by nebulization every 8 (eight) hours as needed for wheezing or shortness of breath.   levocetirizine 5 MG tablet Commonly known as: XYZAL Take 1 tablet (5 mg total) by mouth every evening.   meclizine 25 MG tablet Commonly known as: ANTIVERT Take 1 tablet (25 mg total) by mouth 3 (three) times daily as needed for dizziness.   metoprolol succinate 50 MG 24 hr tablet Commonly known as: TOPROL-XL TAKE 1 TABLET BY MOUTH DAILY. TAKE WITH OR IMMEDIATELY FOLLOWING A MEAL.   mometasone 50 MCG/ACT nasal spray Commonly known as: NASONEX Place 2 sprays into the nose every morning.   montelukast 10 MG tablet Commonly known as: SINGULAIR Take 1 tablet (10 mg total) by mouth at bedtime.   Nebulizer Devi 1 Device by Does not apply route 3 (three) times daily as needed.   nitroGLYCERIN 0.4 MG SL tablet Commonly known as: NITROSTAT PLACE 1 TABLET UNDER THE TONGUE EVERY 5 MINUTES AS NEEDED FOR CHEST PAIN.   VITAMIN C PO Take 1 tablet by mouth daily.    Wegovy 0.25 MG/0.5ML Soaj Generic drug: Semaglutide-Weight Management Inject 0.25 mg into the skin once a week. Started by: Felix Pacini   ZINC PO Take 1 tablet by mouth at bedtime.        All past medical history, surgical history, allergies,  family history, immunizations andmedications were updated in the EMR today and reviewed under the history and medication portions of their EMR.      ROS: 14 pt review of systems performed and negative (unless mentioned in an HPI)  Objective: BP 102/80   Pulse 70   Temp 97.6 F (36.4 C)   Ht 5\' 8"  (1.727 m)   Wt 289 lb 6.4 oz (131.3 kg)   SpO2 96%   BMI 44.00 kg/m  Physical Exam Vitals and nursing note reviewed.  Constitutional:      General: She is not in acute distress.    Appearance: Normal appearance. She is normal weight. She is not ill-appearing or toxic-appearing.  HENT:     Head: Normocephalic and atraumatic.  Eyes:     General: No scleral icterus.       Right eye: No discharge.        Left eye: No discharge.     Extraocular Movements: Extraocular movements intact.     Conjunctiva/sclera: Conjunctivae normal.     Pupils: Pupils are equal, round, and reactive to light.  Skin:    Findings: No rash.  Neurological:     Mental Status: She is alert and oriented to person, place, and time. Mental status is at baseline.     Motor: No weakness.     Coordination: Coordination normal.     Gait: Gait normal.  Psychiatric:        Mood and Affect: Mood normal.        Behavior: Behavior normal.        Thought Content: Thought content normal.        Judgment: Judgment normal.     No results found.  Assessment/plan: Alyssa Phillips is a 68 y.o. female present for  Morbid obesity, history of heart attack, CAD, hyperlipidemia, hypertension Patient was counseled on exercise, calorie counting, weight loss and potential medications to help with weight loss today. -Patient was provided with online resources for: Weekly net  calorie calculator.  Applications for calorie counting.  Patient was advised to ensure she is taking in adequate nutrition daily by meeting calorie goals. -Patient was educated on dietary changes to not only lose weight but to eat healthy.  Patient was educated on glycemic index. -Patient was educated on exercise goal of 150 minutes a week (plus warm up and cool down) of cardiovascular exercise.    -Start with 20 minutes 3 times a week and work her way up. -Patient was encouraged to maintain adequate water consumption of at least 80 ounces a day, more if exercising/sweating. Reginal Lutes tapering start.  Hopefully this will be covered by insurance with her history of heart attack, CAD, hypertension, hyperlipidemia and morbid obesity. -She will make a nurse visit for proper injection technique tutorial once medication approved and second tapering dose will be called in for her. If medication approved follow-up in 5-6 weeks recommended.  No follow-ups on file.   No orders of the defined types were placed in this encounter.  Meds ordered this encounter  Medications   WEGOVY 0.25 MG/0.5ML SOAJ    Sig: Inject 0.25 mg into the skin once a week.    Dispense:  2 mL    Refill:  0   Referral Orders  No referral(s) requested today     Electronically signed by: Felix Pacini, DO [facility]

## 2023-05-14 NOTE — Telephone Encounter (Addendum)
Left message to call back to schedule tele preop appt add on 05/17/23 @ 11:00 per Tereso Newcomer, PAC.

## 2023-05-15 ENCOUNTER — Telehealth: Payer: Self-pay

## 2023-05-15 NOTE — Telephone Encounter (Signed)
  Patient Consent for Virtual Visit        Marialuiza B Crumbley has provided verbal consent on 05/15/2023 for a virtual visit (video or telephone).   CONSENT FOR VIRTUAL VISIT FOR:  Kassadi B Kuster  By participating in this virtual visit I agree to the following:  I hereby voluntarily request, consent and authorize [facility] and its employed or contracted physicians, physician assistants, nurse practitioners or other licensed health care professionals (the Practitioner), to provide me with telemedicine health care services (the "Services") as deemed necessary by the treating Practitioner. I acknowledge and consent to receive the Services by the Practitioner via telemedicine. I understand that the telemedicine visit will involve communicating with the Practitioner through live audiovisual communication technology and the disclosure of certain medical information by electronic transmission. I acknowledge that I have been given the opportunity to request an in-person assessment or other available alternative prior to the telemedicine visit and am voluntarily participating in the telemedicine visit.  I understand that I have the right to withhold or withdraw my consent to the use of telemedicine in the course of my care at any time, without affecting my right to future care or treatment, and that the Practitioner or I may terminate the telemedicine visit at any time. I understand that I have the right to inspect all information obtained and/or recorded in the course of the telemedicine visit and may receive copies of available information for a reasonable fee.  I understand that some of the potential risks of receiving the Services via telemedicine include:  Delay or interruption in medical evaluation due to technological equipment failure or disruption; Information transmitted may not be sufficient (e.g. poor resolution of images) to allow for appropriate medical decision making by the  Practitioner; and/or  In rare instances, security protocols could fail, causing a breach of personal health information.  Furthermore, I acknowledge that it is my responsibility to provide information about my medical history, conditions and care that is complete and accurate to the best of my ability. I acknowledge that Practitioner's advice, recommendations, and/or decision may be based on factors not within their control, such as incomplete or inaccurate data provided by me or distortions of diagnostic images or specimens that may result from electronic transmissions. I understand that the practice of medicine is not an exact science and that Practitioner makes no warranties or guarantees regarding treatment outcomes. I acknowledge that a copy of this consent can be made available to me via my patient portal [facility] MyChart), or I can request a printed copy by calling the office of [facility].    I understand that my insurance will be billed for this visit.   I have read or had this consent read to me. I understand the contents of this consent, which adequately explains the benefits and risks of the Services being provided via telemedicine.  I have been provided ample opportunity to ask questions regarding this consent and the Services and have had my questions answered to my satisfaction. I give my informed consent for the services to be provided through the use of telemedicine in my medical care

## 2023-05-15 NOTE — Telephone Encounter (Signed)
Preop tele visit now scheduled, med rec and consent done. Slot ok per APP

## 2023-05-16 ENCOUNTER — Telehealth: Payer: Self-pay | Admitting: Pharmacist

## 2023-05-16 NOTE — Telephone Encounter (Signed)
Pharmacy Patient Advocate Encounter   Received notification from Physician's Office that prior authorization for Wegovy 0.25MG /0.5ML auto-injectors is required/requested.   Insurance verification completed.   The patient is insured through [facility] .   Per test claim: PA required; PA submitted to above mentioned insurance via CoverMyMeds Key/confirmation #/E[facility] Status is pending

## 2023-05-17 ENCOUNTER — Other Ambulatory Visit (HOSPITAL_COMMUNITY): Payer: Self-pay

## 2023-05-17 ENCOUNTER — Ambulatory Visit: Payer: BC Managed Care – PPO | Attending: Cardiology

## 2023-05-17 DIAGNOSIS — Z01810 Encounter for preprocedural cardiovascular examination: Secondary | ICD-10-CM | POA: Diagnosis not present

## 2023-05-17 NOTE — Telephone Encounter (Signed)
Pharmacy Patient Advocate Encounter  Received notification from [facility] that Prior Authorization for [facility] 0.25MG /0.5ML auto-injectors has been APPROVED from 05/16/2023 to 12/14/2023   PA #/Case ID/Reference #: [id number]

## 2023-05-17 NOTE — Progress Notes (Signed)
Virtual Visit via Telephone Note   Because of Alyssa Phillips's co-morbid illnesses, she is at least at moderate risk for complications without adequate follow up.  This format is felt to be most appropriate for this patient at this time.  The patient did not have access to video technology/had technical difficulties with video requiring transitioning to audio format only (telephone).  All issues noted in this document were discussed and addressed.  No physical exam could be performed with this format.  Please refer to the patient's chart for her consent to telehealth for [facility].  Evaluation Performed:  Preoperative cardiovascular risk assessment _____________   Date:  05/17/2023   Patient ID:  Alyssa Phillips, DOB June 30, 1955, MRN [id number] Patient Location:  Home Provider location:   Office  Primary Care Provider:  Natalia Leatherwood, DO Primary Cardiologist:  Peter Swaziland, MD  Chief Complaint / Patient Profile   68 y.o. y/o female with a h/o HTN, HLD, asthma, coronary artery disease, EF 40-45% who is pending RIGHT WRIST ARTHROSCOPY WITH TFCC DEBRIDEMENT, POSSIBLE REPAIR, POSSIBLE DISTAL ULNA EXCISION  and presents today for telephonic preoperative cardiovascular risk assessment.  History of Present Illness    Alyssa Phillips is a 68 y.o. female who presents via audio/video conferencing for a telehealth visit today.  Pt was last seen in cardiology clinic on 02/08/2023 by Dr. Swaziland.  At that time Alyssa Phillips was doing well .  The patient is now pending procedure as outlined above. Since her last visit, she remained stable from a cardiac standpoint.  Today she denies chest pain, shortness of breath, lower extremity edema, fatigue, palpitations, melena, hematuria, hemoptysis, diaphoresis, weakness, presyncope, syncope, orthopnea, and PND.   Past Medical History    Past Medical History:  Diagnosis Date   Allergic rhinitis    Arthritis     Osteoarthrits-knees-hx. RTKA   Asthma    environmental agents induced asthma   Carpal tunnel syndrome of left wrist 02/19/2020   Carpal tunnel syndrome of right wrist 02/19/2020   Coronary artery disease    Family history of adverse reaction to anesthesia    daughter very sensitive to anesthesia   GERD (gastroesophageal reflux disease)    Heart murmur    hx. mitral valve proplapse-mostly asymptomatic-->not apprectiaed n echo   Hepatic steatosis 03/05/2008   [place]   History of kidney stones    Hyperlipidemia    Hypertension    Morbid obesity due to excess calories (HCC)    MRSA infection 08/2022   Pain in joint of left shoulder 06/12/2017   Pain in left foot 04/04/2018   Pain of joint of left ankle and foot 12/18/2019   Pneumonia    Primary hyperparathyroidism (HCC)    Status post parathyroidectomy 12/27/2022   STEMI (ST elevation myocardial infarction) (HCC) 10/2017   LAD treated with DESx1   UTI (urinary tract infection)    Vitamin D deficiency    Past Surgical History:  Procedure Laterality Date   5th finger surgery Right 1990   unknown injury   CARDIAC CATHETERIZATION     CARPAL TUNNEL RELEASE  08/2020   CHOLECYSTECTOMY     COLONOSCOPY WITH PROPOFOL N/A 06/08/2016   Procedure: COLONOSCOPY WITH PROPOFOL;  Surgeon: Dorena Cookey, MD;  Location: [facility] ENDOSCOPY;  Service: Endoscopy;  Laterality: N/A;   CORONARY STENT INTERVENTION N/A 11/01/2017   Procedure: CORONARY STENT INTERVENTION;  Surgeon: Swaziland, Peter M, MD;  Location: [facility] INVASIVE CV LAB;  Service: Cardiovascular;  Laterality:  N/A;  Resolute Onyx 4.5 mm x12 mm   CORONARY/GRAFT ACUTE MI REVASCULARIZATION N/A 11/01/2017   Procedure: Coronary/Graft Acute MI Revascularization;  Surgeon: Swaziland, Peter M, MD;  Location: [facility] INVASIVE CV LAB;  Service: Cardiovascular;  Laterality: N/A;   CYSTOSCOPY W/ URETERAL STENT PLACEMENT Left 02/06/2022   Procedure: CYSTOSCOPY WITH RETROGRADE PYELOGRAM/URETERAL STENT PLACEMENT;  Surgeon: Despina Arias, MD;  Location: [facility] ORS;  Service: Urology;  Laterality: Left;   CYSTOSCOPY/URETEROSCOPY/HOLMIUM LASER/STENT PLACEMENT Left 02/22/2022   Procedure: LEFT URETEROSCOPY/HOLMIUM LASER/STENT EXCHANGE;  Surgeon: Despina Arias, MD;  Location: [facility] ORS;  Service: Urology;  Laterality: Left;  60 MINUTES NEEDED FOR CASE   I & D EXTREMITY Left 06/30/2021   Procedure: IRRIGATION AND DEBRIDEMENT left elbow;  Surgeon: Gomez Cleverly, MD;  Location: [facility] OR;  Service: Orthopedics;  Laterality: Left;   KNEE ARTHROSCOPY  05/02/2012   x2 right/ x1 left   LEFT HEART CATH AND CORONARY ANGIOGRAPHY N/A 11/01/2017   Procedure: LEFT HEART CATH AND CORONARY ANGIOGRAPHY;  Surgeon: Swaziland, Peter M, MD;  Location: [facility] INVASIVE CV LAB;  Service: Cardiovascular;  Laterality: N/A;   PARATHYROIDECTOMY Right 11/24/2022   Procedure: RIGHT PARATHYROIDECTOMY;  Surgeon: Darnell Level, MD;  Location: [facility] ORS;  Service: General;  Laterality: Right;  89 2ND SCRUB PERSON   REPLACEMENT TOTAL KNEE Right 10/2010   ROOT CANAL     TOTAL KNEE ARTHROPLASTY  05/10/2012   Procedure: TOTAL KNEE ARTHROPLASTY;  Surgeon: Loanne Drilling, MD;  Location: [facility] ORS;  Service: Orthopedics;  Laterality: Left;   TUBAL LIGATION     UMBILICAL HERNIA REPAIR      Allergies  Allergies  Allergen Reactions   Compazine [Prochlorperazine Edisylate] Other (See Comments)    Eyes rolled back, tongue curled up   Advil [Ibuprofen] Hives   Aspirin Other (See Comments)    HAS to be EC aspirin!!    Home Medications    Prior to Admission medications   Medication Sig Start Date End Date Taking? Authorizing Provider  Ascorbic Acid (VITAMIN C PO) Take 1 tablet by mouth daily.    [provider]  aspirin EC 81 MG EC tablet Take 1 tablet (81 mg total) by mouth daily. 11/03/17   Robbie Lis M, PA-C  atorvastatin (LIPITOR) 80 MG tablet Take 1 tablet (80 mg total) by mouth daily. 02/08/23   Swaziland, Peter M, MD  azelastine (OPTIVAR) 0.05 % ophthalmic  solution Place 1 drop into both eyes daily as needed (itching).    [provider]  B Complex Vitamins (B COMPLEX 1 PO) Take 1 tablet by mouth at bedtime.    [provider]  Budeson-Glycopyrrol-Formoterol 160-9-4.8 MCG/ACT AERO Inhale 1 puff into the lungs 2 (two) times daily. 03/30/23   Kuneff, Renee A, DO  candesartan (ATACAND) 4 MG tablet TAKE 1 TABLET BY MOUTH EVERY DAY 02/06/23   Swaziland, Peter M, MD  cholecalciferol (VITAMIN D) 1000 units tablet Take 1,000 Units by mouth 2 (two) times daily.    [provider]  Coenzyme Q10 (CO Q 10 PO) Take 1 capsule by mouth daily.    [provider]  diclofenac sodium (VOLTAREN) 1 % GEL Apply 2-4 g topically daily as needed (knee pain).    [provider]  levalbuterol Pauline Aus) 1.25 MG/3ML nebulizer solution Take 1.25 mg by nebulization every 8 (eight) hours as needed for wheezing or shortness of breath. 01/30/23   Crain, Whitney L, PA  levocetirizine (XYZAL) 5 MG tablet Take 1 tablet (5 mg  total) by mouth every evening. 03/29/23   Kuneff, Renee A, DO  meclizine (ANTIVERT) 25 MG tablet Take 1 tablet (25 mg total) by mouth 3 (three) times daily as needed for dizziness. 03/29/23   Kuneff, Renee A, DO  metoprolol succinate (TOPROL-XL) 50 MG 24 hr tablet TAKE 1 TABLET BY MOUTH DAILY. TAKE WITH OR IMMEDIATELY FOLLOWING A MEAL. 05/14/23   [place]azil[place], Peter M, MD  mometasone (NASONEX) 50 MCG/ACT nasal spray Place 2 sprays into the nose every morning. 03/29/23   Kuneff, Renee A, DO  montelukast (SINGULAIR) 10 MG tablet Take 1 tablet (10 mg total) by mouth at bedtime. 03/29/23   Kuneff, Renee A, DO  Multiple Vitamins-Minerals (ZINC PO) Take 1 tablet by mouth at bedtime.    [provider]  nitroGLYCERIN (NITROSTAT) 0.4 MG SL tablet PLACE 1 TABLET UNDER THE TONGUE EVERY 5 MINUTES AS NEEDED FOR CHEST PAIN. 08/10/22   [place]azil[place], Peter M, MD  Respiratory Therapy Supplies (NEBULIZER) DEVI 1 Device by Does not apply route 3  (three) times daily as needed. 09/21/21   Kuneff, Renee A, DO  WEGOVY 0.25 MG/0.5ML SOAJ Inject 0.25 mg into the skin once a week. 05/14/23   Natalia Leatherwood, DO    Physical Exam    Vital Signs:  Alyssa Phillips does not have vital signs available for review today.  Given telephonic nature of communication, physical exam is limited. AAOx3. NAD. Normal affect.  Speech and respirations are unlabored.  Accessory Clinical Findings    None  Assessment & Plan    1.  Preoperative Cardiovascular Risk Assessment:RIGHT WRIST ARTHROSCOPY WITH TFCC DEBRIDEMENT, POSSIBLE REPAIR, POSSIBLE DISTAL ULNA EXCISION   Date of Surgery:  Clearance 05/29/23                               Surgeon:  DR. Gomez Cleverly Surgeon's Group or Practice Name:  [facility] Fax number:  [phone]      Primary Cardiologist: Peter Swaziland, MD  Chart reviewed as part of pre-operative protocol coverage. Given past medical history and time since last visit, based on ACC/AHA guidelines, Alyssa Phillips would be at acceptable risk for the planned procedure without further cardiovascular testing.   Her RCRI is moderate risk, 6.6% risk of major cardiac event.  She is able to complete greater than 4 METS of physical activity.  Her aspirin ideally should be continued. Procedure is intermediate risk for bleeding. If risk of bleeding is too great, ASA can be held for 7 days and resumed post op when safe.   Patient was advised that if she develops new symptoms prior to surgery to contact our office to arrange a follow-up appointment.  He verbalized understanding.  I will route this recommendation to the requesting party via Epic fax function and remove from pre-op pool.       Time:   Today, I have spent 5 minutes with the patient with telehealth technology discussing medical history, symptoms, and management plan.     Ronney Asters, NP  05/17/2023, 7:02 AM

## 2023-05-22 ENCOUNTER — Ambulatory Visit: Payer: BC Managed Care – PPO

## 2023-05-22 NOTE — Telephone Encounter (Signed)
Pt advised to discuss with surgeon on what is suggested for upcoming procedure

## 2023-05-29 DIAGNOSIS — S63591A Other specified sprain of right wrist, initial encounter: Secondary | ICD-10-CM | POA: Diagnosis not present

## 2023-05-29 DIAGNOSIS — M19031 Primary osteoarthritis, right wrist: Secondary | ICD-10-CM | POA: Diagnosis not present

## 2023-05-29 DIAGNOSIS — Y999 Unspecified external cause status: Secondary | ICD-10-CM | POA: Diagnosis not present

## 2023-05-29 DIAGNOSIS — M24231 Disorder of ligament, right wrist: Secondary | ICD-10-CM | POA: Diagnosis not present

## 2023-05-29 DIAGNOSIS — M94231 Chondromalacia, right wrist: Secondary | ICD-10-CM | POA: Diagnosis not present

## 2023-05-29 DIAGNOSIS — X58XXXA Exposure to other specified factors, initial encounter: Secondary | ICD-10-CM | POA: Diagnosis not present

## 2023-05-29 DIAGNOSIS — G8918 Other acute postprocedural pain: Secondary | ICD-10-CM | POA: Diagnosis not present

## 2023-05-29 DIAGNOSIS — M24131 Other articular cartilage disorders, right wrist: Secondary | ICD-10-CM | POA: Diagnosis not present

## 2023-05-29 DIAGNOSIS — M24831 Other specific joint derangements of right wrist, not elsewhere classified: Secondary | ICD-10-CM | POA: Diagnosis not present

## 2023-06-01 ENCOUNTER — Ambulatory Visit: Payer: BC Managed Care – PPO

## 2023-06-14 DIAGNOSIS — M19072 Primary osteoarthritis, left ankle and foot: Secondary | ICD-10-CM | POA: Diagnosis not present

## 2023-06-18 DIAGNOSIS — M25631 Stiffness of right wrist, not elsewhere classified: Secondary | ICD-10-CM | POA: Diagnosis not present

## 2023-06-26 ENCOUNTER — Encounter: Payer: Self-pay | Admitting: Family Medicine

## 2023-06-26 ENCOUNTER — Ambulatory Visit: Admitting: Family Medicine

## 2023-06-26 DIAGNOSIS — I2510 Atherosclerotic heart disease of native coronary artery without angina pectoris: Secondary | ICD-10-CM

## 2023-06-26 DIAGNOSIS — E782 Mixed hyperlipidemia: Secondary | ICD-10-CM | POA: Diagnosis not present

## 2023-06-26 MED ORDER — WEGOVY 1 MG/0.5ML ~~LOC~~ SOAJ: 1.0000 mg | SUBCUTANEOUS | 0 refills | Status: DC

## 2023-06-26 MED ORDER — WEGOVY 0.5 MG/0.5ML ~~LOC~~ SOAJ: 0.5000 mg | SUBCUTANEOUS | 0 refills | Status: DC

## 2023-06-26 NOTE — Patient Instructions (Addendum)

## 2023-06-26 NOTE — Progress Notes (Signed)
 Patient ID: Alyssa Phillips, female  DOB: 03-31-1956, 68 y.o.   MRN: [id number] Patient Care Team    Relationship Specialty Notifications Start End  Natalia Leatherwood, DO PCP - General Family Medicine  12/04/17   Swaziland, Peter M, MD PCP - Cardiology Cardiology  10/30/22   Dorena Cookey, MD (Inactive) Consulting Physician Gastroenterology  12/04/17   Ollen Gross, MD Consulting Physician Orthopedic Surgery  12/04/17   Sidney Ace, MD Referring Physician Allergy  12/12/19   Swaziland, Peter M, MD Consulting Physician Cardiology  12/12/19   Shamleffer, Konrad Dolores, MD Consulting Physician Endocrinology  12/12/19     Chief Complaint  Patient presents with   Obesity    Subjective: Alyssa Phillips is a 68 y.o.  Female  present for discussion on weight loss/morbid obesity All past medical history, surgical history, allergies, family history, immunizations, medications and social history were updated in the electronic medical record today. All recent labs, ED visits and hospitalizations within the last year were reviewed.  Weight/lbs: 289.5>285 BMI: 44>43.46  Tolerating start of wegovy. No side effects.  Making dietary changes and walking more.   Prior note Patient brings with her her food logs today demonstrate multiple complex carbohydrates including potatoes, breads, pasta, crackers.  She does not count calories.  She drinks approximately 64 ounces of water a day plus unsweetened iced tea. Currently she does not exercise routinely secondary to muscle skeletal issues. She has a history of myocardial infarction/STEMI, CAD, hypertension and morbid obesity.        06/26/2023    8:07 AM 03/29/2023    8:14 AM 08/31/2022    9:51 AM 07/04/2022    1:06 PM 03/24/2022   10:40 AM  Depression screen PHQ 2/9  Decreased Interest 0 0 0 0 0  Down, Depressed, Hopeless 0 0 0 0 0  PHQ - 2 Score 0 0 0 0 0  Altered sleeping 1      Tired, decreased energy 1      Change in appetite 0      Feeling  bad or failure about yourself  0      Trouble concentrating 0      Moving slowly or fidgety/restless 0      Suicidal thoughts 0      PHQ-9 Score 2      Difficult doing work/chores Not difficult at all          06/26/2023    8:08 AM 12/11/2019    9:08 AM  GAD 7 : Generalized Anxiety Score  Nervous, Anxious, on Edge 0 0  Control/stop worrying 0 0  Worry too much - different things 0 0  Trouble relaxing 0 0  Restless 0 0  Easily annoyed or irritable 0 0  Afraid - awful might happen 0 0  Total GAD 7 Score 0 0  Anxiety Difficulty Not difficult at all      Immunization History  Administered Date(s) Administered   Fluad Quad(high Dose 65+) 02/08/2021, 03/24/2022   Fluad Trivalent(High Dose 65+) 03/29/2023   Influenza Split 05/19/2008, 02/01/2010, 01/31/2011, 01/30/2012   Influenza, High Dose Seasonal PF 01/29/2018, 02/10/2019   Influenza,inj,Quad PF,6+ Mos 03/10/2015, 12/22/2016, 12/04/2017, 12/09/2018, 12/11/2019   Influenza,inj,Quad PF,6-35 Mos 05/15/2016   Influenza,inj,quad, With Preservative 01/01/2014, 12/18/2016   Influenza-Unspecified 05/15/2016   PFIZER(Purple Top)SARS-COV-2 Vaccination 06/14/2019, 07/09/2019, 11/03/2019, 01/20/2020   PNEUMOCOCCAL CONJUGATE-20 03/24/2022   Pneumococcal Polysaccharide-23 12/04/2017, 01/29/2018   Respiratory Syncytial Virus Vaccine,Recomb Aduvanted(Arexvy) 04/27/2022   Tdap 05/13/2010, 12/09/2018  Zoster Recombinant(Shingrix) 12/11/2019, 08/01/2021    Past Medical History:  Diagnosis Date   Allergic rhinitis    Arthritis    Osteoarthrits-knees-hx. RTKA   Asthma    environmental agents induced asthma   Carpal tunnel syndrome of left wrist 02/19/2020   Carpal tunnel syndrome of right wrist 02/19/2020   Coronary artery disease    Family history of adverse reaction to anesthesia    daughter very sensitive to anesthesia   GERD (gastroesophageal reflux disease)    Heart murmur    hx. mitral valve proplapse-mostly asymptomatic-->not  apprectiaed n echo   Hepatic steatosis 03/05/2008   [place]   History of kidney stones    Hyperlipidemia    Hypertension    Morbid obesity due to excess calories (HCC)    MRSA infection 08/2022   Pain in joint of left shoulder 06/12/2017   Pain in left foot 04/04/2018   Pain of joint of left ankle and foot 12/18/2019   Pneumonia    Primary hyperparathyroidism (HCC)    Status post parathyroidectomy 12/27/2022   STEMI (ST elevation myocardial infarction) (HCC) 10/2017   LAD treated with DESx1   UTI (urinary tract infection)    Vitamin D deficiency    Allergies  Allergen Reactions   Compazine [Prochlorperazine Edisylate] Other (See Comments)    Eyes rolled back, tongue curled up   Advil [Ibuprofen] Hives   Aspirin Other (See Comments)    HAS to be EC aspirin!!   Past Surgical History:  Procedure Laterality Date   5th finger surgery Right 1990   unknown injury   CARDIAC CATHETERIZATION     CARPAL TUNNEL RELEASE  08/2020   CHOLECYSTECTOMY     COLONOSCOPY WITH PROPOFOL N/A 06/08/2016   Procedure: COLONOSCOPY WITH PROPOFOL;  Surgeon: Dorena Cookey, MD;  Location: [facility] ENDOSCOPY;  Service: Endoscopy;  Laterality: N/A;   CORONARY STENT INTERVENTION N/A 11/01/2017   Procedure: CORONARY STENT INTERVENTION;  Surgeon: Swaziland, Peter M, MD;  Location: [facility] INVASIVE CV LAB;  Service: Cardiovascular;  Laterality: N/A;  Resolute Onyx 4.5 mm x12 mm   CORONARY/GRAFT ACUTE MI REVASCULARIZATION N/A 11/01/2017   Procedure: Coronary/Graft Acute MI Revascularization;  Surgeon: Swaziland, Peter M, MD;  Location: [facility] INVASIVE CV LAB;  Service: Cardiovascular;  Laterality: N/A;   CYSTOSCOPY W/ URETERAL STENT PLACEMENT Left 02/06/2022   Procedure: CYSTOSCOPY WITH RETROGRADE PYELOGRAM/URETERAL STENT PLACEMENT;  Surgeon: Despina Arias, MD;  Location: [facility] ORS;  Service: Urology;  Laterality: Left;   CYSTOSCOPY/URETEROSCOPY/HOLMIUM LASER/STENT PLACEMENT Left 02/22/2022   Procedure: LEFT URETEROSCOPY/HOLMIUM LASER/STENT  EXCHANGE;  Surgeon: Despina Arias, MD;  Location: [facility] ORS;  Service: Urology;  Laterality: Left;  60 MINUTES NEEDED FOR CASE   I & D EXTREMITY Left 06/30/2021   Procedure: IRRIGATION AND DEBRIDEMENT left elbow;  Surgeon: Gomez Cleverly, MD;  Location: [facility] OR;  Service: Orthopedics;  Laterality: Left;   KNEE ARTHROSCOPY  05/02/2012   x2 right/ x1 left   LEFT HEART CATH AND CORONARY ANGIOGRAPHY N/A 11/01/2017   Procedure: LEFT HEART CATH AND CORONARY ANGIOGRAPHY;  Surgeon: Swaziland, Peter M, MD;  Location: [facility] INVASIVE CV LAB;  Service: Cardiovascular;  Laterality: N/A;   PARATHYROIDECTOMY Right 11/24/2022   Procedure: RIGHT PARATHYROIDECTOMY;  Surgeon: Darnell Level, MD;  Location: [facility] ORS;  Service: General;  Laterality: Right;  90 2ND SCRUB PERSON   REPLACEMENT TOTAL KNEE Right 10/2010   ROOT CANAL     TOTAL KNEE ARTHROPLASTY  05/10/2012   Procedure: TOTAL KNEE ARTHROPLASTY;  Surgeon: Loanne Drilling,  MD;  Location: [facility] ORS;  Service: Orthopedics;  Laterality: Left;   TUBAL LIGATION     UMBILICAL HERNIA REPAIR     Family History  Problem Relation Age of Onset   Hypertension Mother    Arthritis Mother    Lymphoma Mother    Asthma Mother    Cancer Mother    Hearing loss Mother    Miscarriages / [place] Mother    COPD Father    Alcohol abuse Father    Arthritis Father    Hearing loss Father    Hypertension Father    Miscarriages / [place] Sister    Arthritis Maternal Grandmother    Hearing loss Maternal Grandmother    Breast cancer Maternal Grandmother 34   Arthritis Maternal Grandfather    Diabetes Maternal Grandfather    Hypertension Maternal Grandfather    Stroke Maternal Grandfather    Arthritis Paternal Grandmother    Diabetes Paternal Grandmother    Hearing loss Paternal Grandmother    Hearing loss Paternal Grandfather    Learning disabilities Paternal Grandfather    Social History   Social History Narrative   Marital status/children/pets: married    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        Medication List        Accurate as of June 26, 2023  8:27 AM. If you have any questions, ask your nurse or doctor.          aspirin EC 81 MG tablet Take 1 tablet (81 mg total) by mouth daily.   atorvastatin 80 MG tablet Commonly known as: LIPITOR Take 1 tablet (80 mg total) by mouth daily.   azelastine 0.05 % ophthalmic solution Commonly known as: OPTIVAR Place 1 drop into both eyes daily as needed (itching).   B COMPLEX 1 PO Take 1 tablet by mouth at bedtime.   budeson-glycopyrrolate-formoterol 160-9-4.8 MCG/ACT Aero Inhale 1 puff into the lungs 2 (two) times daily.   candesartan 4 MG tablet Commonly known as: ATACAND TAKE 1 TABLET BY MOUTH EVERY DAY   cholecalciferol 1000 units tablet Commonly known as: VITAMIN D Take 1,000 Units by mouth 2 (two) times daily.   CO Q 10 PO Take 1 capsule by mouth daily.   diclofenac sodium 1 % Gel Commonly known as: VOLTAREN Apply 2-4 g topically daily as needed (knee pain).   levalbuterol 1.25 MG/3ML nebulizer solution Commonly known as: XOPENEX Take 1.25 mg by nebulization every 8 (eight) hours as needed for wheezing or shortness of breath.   levocetirizine 5 MG tablet Commonly known as: XYZAL Take 1 tablet (5 mg total) by mouth every evening.   meclizine 25 MG tablet Commonly known as: ANTIVERT Take 1 tablet (25 mg total) by mouth 3 (three) times daily as needed for dizziness.   metoprolol succinate 50 MG 24 hr tablet Commonly known as: TOPROL-XL TAKE 1 TABLET BY MOUTH DAILY. TAKE WITH OR IMMEDIATELY FOLLOWING A MEAL.    mometasone 50 MCG/ACT nasal spray Commonly known as: NASONEX Place 2 sprays into the nose every morning.  montelukast 10 MG tablet Commonly known as: SINGULAIR Take 1 tablet (10 mg total) by mouth at bedtime.   Nebulizer Devi 1 Device by Does not apply route 3 (three) times daily as needed.   nitroGLYCERIN 0.4 MG SL tablet Commonly known as: NITROSTAT PLACE 1 TABLET UNDER THE TONGUE EVERY 5 MINUTES AS NEEDED FOR CHEST PAIN.   VITAMIN C PO Take 1 tablet by mouth daily.   Wegovy 0.25 MG/0.5ML Soaj Generic drug: Semaglutide-Weight Management Inject 0.25 mg into the skin once a week. What changed: Another medication with the same name was added. Make sure you understand how and when to take each. Changed by: Felix Pacini   Wegovy 0.5 MG/0.5ML Soaj Generic drug: Semaglutide-Weight Management Inject 0.5 mg into the skin once a week. What changed: You were already taking a medication with the same name, and this prescription was added. Make sure you understand how and when to take each. Changed by: Nolen Mu 1 MG/0.5ML Soaj Generic drug: Semaglutide-Weight Management Inject 1 mg into the skin once a week. Start taking on: July 17, 2023 What changed: You were already taking a medication with the same name, and this prescription was added. Make sure you understand how and when to take each. Changed by: Felix Pacini   ZINC PO Take 1 tablet by mouth at bedtime.        All past medical history, surgical history, allergies, family history, immunizations andmedications were updated in the EMR today and reviewed under the history and medication portions of their EMR.      ROS: 14 pt review of systems performed and negative (unless mentioned in an HPI)  Objective: BP 114/76   Pulse 68   Temp 97.9 F (36.6 C)   Wt 285 lb 12.8 oz (129.6 kg)   SpO2 95%   BMI 43.46 kg/m  Physical Exam Vitals and nursing note reviewed.  Constitutional:      General: She is not  in acute distress.    Appearance: Normal appearance. She is normal weight. She is not ill-appearing or toxic-appearing.  HENT:     Head: Normocephalic and atraumatic.  Eyes:     General: No scleral icterus.       Right eye: No discharge.        Left eye: No discharge.     Extraocular Movements: Extraocular movements intact.     Conjunctiva/sclera: Conjunctivae normal.     Pupils: Pupils are equal, round, and reactive to light.  Skin:    Findings: No rash.  Neurological:     Mental Status: She is alert and oriented to person, place, and time. Mental status is at baseline.     Motor: No weakness.     Coordination: Coordination normal.     Gait: Gait normal.  Psychiatric:        Mood and Affect: Mood normal.        Behavior: Behavior normal.        Thought Content: Thought content normal.        Judgment: Judgment normal.     No results found.  Assessment/plan: JASHA HODZIC is a 68 y.o. female present for  Morbid obesity, history of heart attack, CAD, hyperlipidemia, hypertension Patient was counseled on exercise, calorie counting. -Patient was provided with online resources for: Weekly net calorie calculator.  Applications for calorie counting.  Patient was advised to ensure she is taking in adequate nutrition daily by meeting calorie goals. -Patient was educated on dietary changes  to not only lose weight but to eat healthy.  Patient was educated on glycemic index. -Patient was educated on exercise goal of 150 minutes a week (plus warm up and cool down) of cardiovascular exercise.    -Start with 20 minutes 3 times a week and work her way up. -Patient was encouraged to maintain adequate water consumption of at least 80 ounces a day, more if exercising/sweating. Reginal Lutes Taper up 0.5 weekly 4 weeks, then 1 mg weekly F/u 6 weeks  Return in about 6 weeks (around 08/07/2023) for Routine chronic condition follow-up.   No orders of the defined types were placed in this  encounter.  Meds ordered this encounter  Medications   WEGOVY 0.5 MG/0.5ML SOAJ    Sig: Inject 0.5 mg into the skin once a week.    Dispense:  2 mL    Refill:  0   WEGOVY 1 MG/0.5ML SOAJ    Sig: Inject 1 mg into the skin once a week.    Dispense:  2 mL    Refill:  0   Referral Orders  No referral(s) requested today     Electronically signed by: Felix Pacini, DO [facility] [place]

## 2023-07-09 ENCOUNTER — Ambulatory Visit
Admission: RE | Admit: 2023-07-09 | Discharge: 2023-07-09 | Disposition: A | Discharge status: Home / Self Care | Payer: BC Managed Care – PPO | Source: Ambulatory Visit | Attending: Family Medicine | Admitting: Family Medicine

## 2023-07-09 DIAGNOSIS — Z1231 Encounter for screening mammogram for malignant neoplasm of breast: Secondary | ICD-10-CM | POA: Diagnosis not present

## 2023-07-10 ENCOUNTER — Encounter: Payer: Self-pay | Admitting: Family Medicine

## 2023-07-20 ENCOUNTER — Other Ambulatory Visit: Payer: Self-pay | Admitting: Family Medicine

## 2023-07-22 ENCOUNTER — Telehealth: Admitting: Family

## 2023-07-22 DIAGNOSIS — R42 Dizziness and giddiness: Secondary | ICD-10-CM

## 2023-07-22 DIAGNOSIS — J0100 Acute maxillary sinusitis, unspecified: Secondary | ICD-10-CM

## 2023-07-22 DIAGNOSIS — J301 Allergic rhinitis due to pollen: Secondary | ICD-10-CM

## 2023-07-22 MED ORDER — MECLIZINE HCL 25 MG PO TABS: 25.0000 mg | ORAL_TABLET | Freq: Three times a day (TID) | ORAL | 1 refills | Status: DC | PRN

## 2023-07-22 MED ORDER — AMOXICILLIN-POT CLAVULANATE 875-125 MG PO TABS: 1.0000 | ORAL_TABLET | Freq: Two times a day (BID) | ORAL | 0 refills | Status: DC

## 2023-07-22 NOTE — Progress Notes (Signed)
 Virtual Visit Consent   Alyssa Phillips, you are scheduled for a virtual visit with a [facility] provider today. Just as with appointments in the office, your consent must be obtained to participate. Your consent will be active for this visit and any virtual visit you may have with one of our providers in the next 365 days. If you have a MyChart account, a copy of this consent can be sent to you electronically.  As this is a virtual visit, video technology does not allow for your provider to perform a traditional examination. This may limit your provider's ability to fully assess your condition. If your provider identifies any concerns that need to be evaluated in person or the need to arrange testing (such as labs, EKG, etc.), we will make arrangements to do so. Although advances in technology are sophisticated, we cannot ensure that it will always work on either your end or our end. If the connection with a video visit is poor, the visit may have to be switched to a telephone visit. With either a video or telephone visit, we are not always able to ensure that we have a secure connection.  By engaging in this virtual visit, you consent to the provision of healthcare and authorize for your insurance to be billed (if applicable) for the services provided during this visit. Depending on your insurance coverage, you may receive a charge related to this service.  I need to obtain your verbal consent now. Are you willing to proceed with your visit today? Alyssa Phillips has provided verbal consent on 07/22/2023 for a virtual visit (video or telephone). Jannifer Rodney, FNP  Date: 07/22/2023 12:33 PM   Virtual Visit via Video Note   I, Jannifer Rodney, connected with  Alyssa Phillips  ([id number], [birth year]) on 07/22/23 at 12:30 PM EDT by a video-enabled telemedicine application and verified that I am speaking with the correct person using two identifiers.  Location: Patient: Virtual Visit Location  Patient: Home Provider: Virtual Visit Location Provider: Home Office   I discussed the limitations of evaluation and management by telemedicine and the availability of in person appointments. The patient expressed understanding and agreed to proceed.    History of Present Illness: Alyssa Phillips is a 68 y.o. who identifies as a female who was assigned female at birth, and is being seen today for sinus congestion and dizziness. Reports she has had allergies for the last two weeks. She has been taking zyrtec and Singulair.   HPI: Dizziness This is a new problem. The current episode started yesterday. The problem occurs intermittently. Associated symptoms include congestion, headaches, nausea and vertigo. Pertinent negatives include no vomiting.  Sinusitis This is a new problem. The current episode started 1 to 4 weeks ago. The problem is unchanged. Her pain is at a severity of 6/10. The pain is mild. Associated symptoms include congestion, headaches and sinus pressure.    Problems:  Patient Active Problem List   Diagnosis Date Noted   Coronary artery disease involving native coronary artery of native heart without angina pectoris 05/14/2023   Bilateral wrist pain 12/20/2022   Closed fracture of triquetral bone of left wrist 11/27/2022   Osteopenia 03/24/2022   Vitamin D deficiency 06/17/2021   Mitral valve prolapse 06/17/2021   Body mass index (BMI) 40.0-44.9, adult (HCC) 06/17/2021   Hypertension 03/14/2021   Tinnitus, bilateral 07/19/2020   Hearing loss 07/19/2020   BPPV (benign paroxysmal positional vertigo), unspecified laterality 07/19/2020   Hyperparathyroidism (HCC)-s/p parathyroidectomy  11/2022 07/22/2019   Allergic rhinitis due to pollen 12/18/2018   History of ST elevation myocardial infarction (STEMI) 12/04/2017   Morbid obesity (HCC) 12/04/2017   Moderate persistent asthma without complication 12/04/2017   Hyperlipidemia    Osteoarthrosis involving lower leg 05/10/2012     Allergies:  Allergies  Allergen Reactions   Compazine [Prochlorperazine Edisylate] Other (See Comments)    Eyes rolled back, tongue curled up   Advil [Ibuprofen] Hives   Aspirin Other (See Comments)    HAS to be EC aspirin!!   Medications:  Current Outpatient Medications:    amoxicillin-clavulanate (AUGMENTIN) 875-125 MG tablet, Take 1 tablet by mouth 2 (two) times daily., Disp: 14 tablet, Rfl: 0   Ascorbic Acid (VITAMIN C PO), Take 1 tablet by mouth daily., Disp: , Rfl:    aspirin EC 81 MG EC tablet, Take 1 tablet (81 mg total) by mouth daily., Disp: , Rfl:    atorvastatin (LIPITOR) 80 MG tablet, Take 1 tablet (80 mg total) by mouth daily., Disp: 90 tablet, Rfl: 3   azelastine (OPTIVAR) 0.05 % ophthalmic solution, Place 1 drop into both eyes daily as needed (itching)., Disp: , Rfl:    B Complex Vitamins (B COMPLEX 1 PO), Take 1 tablet by mouth at bedtime., Disp: , Rfl:    Budeson-Glycopyrrol-Formoterol 160-9-4.8 MCG/ACT AERO, Inhale 1 puff into the lungs 2 (two) times daily., Disp: 10.7 g, Rfl: 11   candesartan (ATACAND) 4 MG tablet, TAKE 1 TABLET BY MOUTH EVERY DAY, Disp: 90 tablet, Rfl: 2   cholecalciferol (VITAMIN D) 1000 units tablet, Take 1,000 Units by mouth 2 (two) times daily., Disp: , Rfl:    Coenzyme Q10 (CO Q 10 PO), Take 1 capsule by mouth daily., Disp: , Rfl:    diclofenac sodium (VOLTAREN) 1 % GEL, Apply 2-4 g topically daily as needed (knee pain)., Disp: , Rfl:    levalbuterol (XOPENEX) 1.25 MG/3ML nebulizer solution, Take 1.25 mg by nebulization every 8 (eight) hours as needed for wheezing or shortness of breath., Disp: 72 mL, Rfl: 11   levocetirizine (XYZAL) 5 MG tablet, Take 1 tablet (5 mg total) by mouth every evening., Disp: 90 tablet, Rfl: 3   meclizine (ANTIVERT) 25 MG tablet, Take 1 tablet (25 mg total) by mouth 3 (three) times daily as needed for dizziness., Disp: 60 tablet, Rfl: 1   metoprolol succinate (TOPROL-XL) 50 MG 24 hr tablet, TAKE 1 TABLET BY MOUTH DAILY.  TAKE WITH OR IMMEDIATELY FOLLOWING A MEAL., Disp: 90 tablet, Rfl: 3   mometasone (NASONEX) 50 MCG/ACT nasal spray, Place 2 sprays into the nose every morning., Disp: 17 g, Rfl: 11   montelukast (SINGULAIR) 10 MG tablet, Take 1 tablet (10 mg total) by mouth at bedtime., Disp: 90 tablet, Rfl: 3   Multiple Vitamins-Minerals (ZINC PO), Take 1 tablet by mouth at bedtime., Disp: , Rfl:    nitroGLYCERIN (NITROSTAT) 0.4 MG SL tablet, PLACE 1 TABLET UNDER THE TONGUE EVERY 5 MINUTES AS NEEDED FOR CHEST PAIN., Disp: 25 tablet, Rfl: 0   Respiratory Therapy Supplies (NEBULIZER) DEVI, 1 Device by Does not apply route 3 (three) times daily as needed., Disp: 1 each, Rfl: 5   WEGOVY 0.25 MG/0.5ML SOAJ, Inject 0.25 mg into the skin once a week., Disp: 2 mL, Rfl: 0   WEGOVY 0.5 MG/0.5ML SOAJ, Inject 0.5 mg into the skin once a week., Disp: 2 mL, Rfl: 0   WEGOVY 1 MG/0.5ML SOAJ, Inject 1 mg into the skin once a week., Disp: 2 mL,  Rfl: 0  Observations/Objective: Patient is well-developed, well-nourished in no acute distress.  Resting comfortably  at home.  Head is normocephalic, atraumatic.  No labored breathing.  Speech is clear and coherent with logical content.  Patient is alert and oriented at baseline.  Nasal congestion Mild pressure in maxillary    Assessment and Plan: 1. Allergic rhinitis due to pollen, unspecified seasonality (Primary)  2. Vertigo - meclizine (ANTIVERT) 25 MG tablet; Take 1 tablet (25 mg total) by mouth 3 (three) times daily as needed for dizziness.  Dispense: 60 tablet; Refill: 1  3. Acute non-recurrent maxillary sinusitis - amoxicillin-clavulanate (AUGMENTIN) 875-125 MG tablet; Take 1 tablet by mouth 2 (two) times daily.  Dispense: 14 tablet; Refill: 0  - Take meds as prescribed - Use a cool mist humidifier  -Use saline nose sprays frequently -Force fluids -For any cough or congestion  Use plain Mucinex- regular strength or max strength is fine -For fever or aces or pains-  take tylenol or ibuprofen. -Throat lozenges if help -Follow up if symptoms worsen or do not improve   Follow Up Instructions: I discussed the assessment and treatment plan with the patient. The patient was provided an opportunity to ask questions and all were answered. The patient agreed with the plan and demonstrated an understanding of the instructions.  A copy of instructions were sent to the patient via MyChart unless otherwise noted below.     The patient was advised to call back or seek an in-person evaluation if the symptoms worsen or if the condition fails to improve as anticipated.    Jannifer Rodney, FNP

## 2023-07-23 ENCOUNTER — Other Ambulatory Visit: Payer: Self-pay | Admitting: Family Medicine

## 2023-07-23 NOTE — Telephone Encounter (Signed)
 Copied from CRM [id number]. Topic: Clinical - Medication Refill >> Jul 23, 2023  2:36 PM Elizebeth Brooking wrote: Most Recent Primary Care Visit:  Provider: Felix Pacini A  Department: [facility]  Visit Type: OFFICE VISIT  Date: 06/26/2023  Medication: WEGOVY 1 MG/0.5ML SOAJ  Has the patient contacted their pharmacy? Yes (Agent: If no, request that the patient contact the pharmacy for the refill. If patient does not wish to contact the pharmacy document the reason why and proceed with request.) (Agent: If yes, when and what did the pharmacy advise?)  Is this the correct pharmacy for this prescription? Yes If no, delete pharmacy and type the correct one.  This is the patient's preferred pharmacy:  [id number] - [place], [place] - [address] [place] [address] Phone: [phone] Fax: [phone]  [id number] - [place], [place] - [address] AT [facility] : [facility] [address] [place] [address] Phone: [phone] Fax: [phone]     Has the prescription been filled recently? No  Is the patient out of the medication? Yes  Has the patient been seen for an appointment in the last year OR does the patient have an upcoming appointment? Yes  Can we respond through MyChart? Yes  Agent: Please be advised that Rx refills may take up to 3 business days. We ask that you follow-up with your pharmacy.

## 2023-07-24 MED ORDER — WEGOVY 1 MG/0.5ML ~~LOC~~ SOAJ: 1.0000 mg | SUBCUTANEOUS | 0 refills | Status: DC

## 2023-08-09 ENCOUNTER — Encounter: Payer: Self-pay | Admitting: Family Medicine

## 2023-08-09 ENCOUNTER — Ambulatory Visit: Admitting: Family Medicine

## 2023-08-09 VITALS — BP 116/74 | HR 70 | Temp 98.2°F | Wt 283.6 lb

## 2023-08-09 DIAGNOSIS — E782 Mixed hyperlipidemia: Secondary | ICD-10-CM

## 2023-08-09 DIAGNOSIS — I252 Old myocardial infarction: Secondary | ICD-10-CM

## 2023-08-09 DIAGNOSIS — I2510 Atherosclerotic heart disease of native coronary artery without angina pectoris: Secondary | ICD-10-CM | POA: Diagnosis not present

## 2023-08-09 DIAGNOSIS — I10 Essential (primary) hypertension: Secondary | ICD-10-CM | POA: Diagnosis not present

## 2023-08-09 MED ORDER — WEGOVY 1.7 MG/0.75ML ~~LOC~~ SOAJ
1.7000 mg | SUBCUTANEOUS | 0 refills | Status: AC
Start: 2023-09-03 — End: ?

## 2023-08-09 MED ORDER — WEGOVY 2.4 MG/0.75ML ~~LOC~~ SOAJ: 2.4000 mg | SUBCUTANEOUS | 5 refills | Status: DC

## 2023-08-09 NOTE — Patient Instructions (Signed)
 Return in about 8 weeks (around 10/04/2023) for Routine chronic condition follow-up.        Great to see you today.  I have refilled the medication(s) we provide.   If labs were collected or images ordered, we will inform you of  results once we have received them and reviewed. We will contact you either by echart message, or telephone call.  Please give ample time to the testing facility, and our office to run,  receive and review results. Please do not call inquiring of results, even if you can see them in your chart. We will contact you as soon as we are able. If it has been over 1 week since the test was completed, and you have not yet heard from us , then please call us .    - echart message- for normal results that have been seen by the patient already.   - telephone call: abnormal results or if patient has not viewed results in their echart.  If a referral to a specialist was entered for you, please call us  in 2 weeks if you have not heard from the specialist office to schedule.

## 2023-08-09 NOTE — Progress Notes (Signed)
 Patient ID: Alyssa Phillips, female  DOB: December 02, 1955, 68 y.o.   MRN: [id number] Patient Care Team    Relationship Specialty Notifications Start End  Mariel Shope, DO PCP - General Family Medicine  12/04/17   Sw[place]iland, Peter M, MD PCP - Cardiology Cardiology  10/30/22   Delilah Fend, MD (Inactive) Consulting Physician Gastroenterology  12/04/17   Liliane Rei, MD Consulting Physician Orthopedic Surgery  12/04/17   Zara Heymann, MD Referring Physician Allergy  12/12/19   [place], Peter M, MD Consulting Physician Cardiology  12/12/19   Shamleffer, Julian Obey, MD Consulting Physician Endocrinology  12/12/19     Chief Complaint  Patient presents with   Coronary Artery Disease    Subjective: Alyssa Phillips is a 68 y.o.  Female  present for combined chronic condition management appointment. All past medical history, surgical history, allergies, family history, immunizations, medications and social history were updated in the electronic medical record today. All recent labs, ED visits and hospitalizations within the last year were reviewed.  Moderate persistent asthma without complication Patient reports her asthma and allergies have been okay.  Asthma has not really been well-controlled since insurance stopped paying for it Symbicort .  She has tried the Trelegy, but does not really like that medication.  She is compliant  albuterol  as needed, Xyzal  nightly, Singulair  nightly and Nasonex .   Vertigo: She is prescribed meclizine  and Zofran  for her vertigo.  She has now seen ENT and vestibular rehab.  Her vertigo was from December 2021 to May 2022.  It then resolved with with vestibular rehab.  With occasional reoccurrence.  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 Swaziland.  She was found to have severe single-vessel disease but 95% proximal LAD treated with DES x1.  EF 45 to  50%.  Medications are managed by cardiology.   Pt reports compliance with  ASA 81, Lipitor , metoprolol , candesartan .  Patient denies chest pain, shortness of breath, dizziness or lower extremity edema.   Diet: has changed to a heart healthy diet.  RF: HTN, HLD, HD, morbid obesity, CAD/STEMI     Weight/lbs: 289.5>285>283 BMI: 44>43.46>43.12  Tolerating start of wegovy . No side effects.  Making dietary changes and walking more.   Prior note Patient brings with her her food logs today demonstrate multiple complex carbohydrates including potatoes, breads, pasta, crackers.  She does not count calories.  She drinks approximately 64 ounces of water  a day plus unsweetened iced tea. Currently she does not exercise routinely secondary to muscle skeletal issues. She has a history of myocardial infarction/STEMI, CAD, hypertension and morbid obesity.     08/09/2023    7:46 AM 06/26/2023    8:07 AM 03/29/2023    8:14 AM 08/31/2022    9:51 AM 07/04/2022    1:06 PM  Depression screen PHQ 2/9  Decreased Interest 0 0 0 0 0  Down, Depressed, Hopeless 0 0 0 0 0  PHQ - 2 Score 0 0 0 0 0  Altered sleeping 0 1     Tired, decreased energy 1 1     Change in appetite 0 0     Feeling bad or failure about yourself  0 0     Trouble concentrating 0 0     Moving slowly or fidgety/restless 0 0     Suicidal thoughts 0 0     PHQ-9 Score 1 2     Difficult doing  work/chores Not difficult at all Not difficult at all         08/09/2023    7:47 AM 06/26/2023    8:08 AM 12/11/2019    9:08 AM  GAD 7 : Generalized Anxiety Score  Nervous, Anxious, on Edge 0 0 0  Control/stop worrying 0 0 0  Worry too much - different things 0 0 0  Trouble relaxing 0 0 0  Restless 0 0 0  Easily annoyed or irritable 0 0 0  Afraid - awful might happen 0 0 0  Total GAD 7 Score 0 0 0  Anxiety Difficulty Not difficult at all Not difficult at all      Immunization History  Administered Date(s) Administered   Fluad Quad(high  Dose 65+) 02/08/2021, 03/24/2022   Fluad Trivalent(High Dose 65+) 03/29/2023   Influenza Split 05/19/2008, 02/01/2010, 01/31/2011, 01/30/2012   Influenza, High Dose Seasonal PF 01/29/2018, 02/10/2019   Influenza,inj,Quad PF,6+ Mos 03/10/2015, 12/22/2016, 12/04/2017, 12/09/2018, 12/11/2019   Influenza,inj,Quad PF,6-35 Mos 05/15/2016   Influenza,inj,quad, With Preservative 01/01/2014, 12/18/2016   Influenza-Unspecified 05/15/2016   PFIZER(Purple Top)SARS-COV-2 Vaccination 06/14/2019, 07/09/2019, 11/03/2019, 01/20/2020   PNEUMOCOCCAL CONJUGATE-20 03/24/2022   Pneumococcal Polysaccharide-23 12/04/2017, 01/29/2018   Respiratory Syncytial Virus Vaccine,Recomb Aduvanted(Arexvy) 04/27/2022   Tdap 05/13/2010, 12/09/2018   Zoster Recombinant(Shingrix ) 12/11/2019, 08/01/2021    Past Medical History:  Diagnosis Date   Allergic rhinitis    Arthritis    Osteoarthrits-knees-hx. RTKA   Asthma    environmental agents induced asthma   Carpal tunnel syndrome of left wrist 02/19/2020   Carpal tunnel syndrome of right wrist 02/19/2020   Coronary artery disease    Family history of adverse reaction to anesthesia    daughter very sensitive to anesthesia   GERD (gastroesophageal reflux disease)    Heart murmur    hx. mitral valve proplapse-mostly asymptomatic-->not apprectiaed n echo   Hepatic steatosis 03/05/2008   US    History of kidney stones    Hyperlipidemia    Hypertension    Morbid obesity due to excess calories (HCC)    MRSA infection 08/2022   Pain in joint of left shoulder 06/12/2017   Pain in left foot 04/04/2018   Pain of joint of left ankle and foot 12/18/2019   Pneumonia    Primary hyperparathyroidism (HCC)    Status post parathyroidectomy 12/27/2022   STEMI (ST elevation myocardial infarction) (HCC) 10/2017   LAD treated with DESx1   UTI (urinary tract infection)    Vitamin D  deficiency    Allergies  Allergen Reactions   Compazine [Prochlorperazine Edisylate] Other (See  Comments)    Eyes rolled back, tongue curled up   Advil [Ibuprofen] Hives   Aspirin  Other (See Comments)    HAS to be EC aspirin !!   Past Surgical History:  Procedure Laterality Date   5th finger surgery Right 1990   unknown injury   CARDIAC CATHETERIZATION     CARPAL TUNNEL RELEASE  08/2020   CHOLECYSTECTOMY     COLONOSCOPY WITH PROPOFOL  N/A 06/08/2016   Procedure: COLONOSCOPY WITH PROPOFOL ;  Surgeon: Delilah Fend, MD;  Location: [facility] ENDOSCOPY;  Service: Endoscopy;  Laterality: N/A;   CORONARY STENT INTERVENTION N/A 11/01/2017   Procedure: CORONARY STENT INTERVENTION;  Surgeon: Swaziland, Peter M, MD;  Location: [facility] INVASIVE CV LAB;  Service: Cardiovascular;  Laterality: N/A;  Resolute Onyx 4.5 mm x12 mm   CORONARY/GRAFT ACUTE MI REVASCULARIZATION N/A 11/01/2017   Procedure: Coronary/Graft Acute MI Revascularization;  Surgeon: Swaziland, Peter M, MD;  Location: [facility] INVASIVE CV  LAB;  Service: Cardiovascular;  Laterality: N/A;   CYSTOSCOPY W/ URETERAL STENT PLACEMENT Left 02/06/2022   Procedure: CYSTOSCOPY WITH RETROGRADE PYELOGRAM/URETERAL STENT PLACEMENT;  Surgeon: Mallie Seal, MD;  Location: [facility] ORS;  Service: Urology;  Laterality: Left;   CYSTOSCOPY/URETEROSCOPY/HOLMIUM LASER/STENT PLACEMENT Left 02/22/2022   Procedure: LEFT URETEROSCOPY/HOLMIUM LASER/STENT EXCHANGE;  Surgeon: Mallie Seal, MD;  Location: [facility] ORS;  Service: Urology;  Laterality: Left;  60 MINUTES NEEDED FOR CASE   I & D EXTREMITY Left 06/30/2021   Procedure: IRRIGATION AND DEBRIDEMENT left elbow;  Surgeon: Ltanya Rummer, MD;  Location[facility] OR;  Service: Orthopedics;  Laterality: Left;   KNEE ARTHROSCOPY  05/02/2012   x2 right/ x1 left   LEFT HEART CATH AND CORONARY ANGIOGRAPHY N/A 11/01/2017   Procedure: LEFT HEART CATH AND CORONARY ANGIOGRAPHY;  Surgeon: Swaziland, Peter M, MD;  Location: [facility] INVASIVE CV LAB;  Service: Cardiovascular;  Laterality: N/A;   PARATHYROIDECTOMY Right 11/24/2022   Procedure: RIGHT PARATHYROIDECTOMY;   Surgeon: Oralee Billow, MD;  Location: [facility] ORS;  Service: General;  Laterality: Right;  89 2ND SCRUB PERSON   REPLACEMENT TOTAL KNEE Right 10/2010   ROOT CANAL     TOTAL KNEE ARTHROPLASTY  05/10/2012   Procedure: TOTAL KNEE ARTHROPLASTY;  Surgeon: Aurther Blue, MD;  Location: [facility] ORS;  Service: Orthopedics;  Laterality: Left;   TUBAL LIGATION     UMBILICAL HERNIA REPAIR     Family History  Problem Relation Age of Onset   Hypertension Mother    Arthritis Mother    Lymphoma Mother    Asthma Mother    Cancer Mother    Hearing loss Mother    Miscarriages / [place] Mother    COPD Father    Alcohol abuse Father    Arthritis Father    Hearing loss Father    Hypertension Father    Miscarriages / [place] Sister    Arthritis Maternal Grandmother    Hearing loss Maternal Grandmother    Breast cancer Maternal Grandmother 61   Arthritis Maternal Grandfather    Diabetes Maternal Grandfather    Hypertension Maternal Grandfather    Stroke Maternal Grandfather    Arthritis Paternal Grandmother    Diabetes Paternal Grandmother    Hearing loss Paternal Grandmother    Hearing loss Paternal Grandfather    Learning disabilities Paternal Grandfather    Social History   Social History Narrative   Marital status/children/pets: married   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        Medication List        Accurate as of August 09, 2023  8:06 AM. If you have any questions, ask your nurse or doctor.          STOP taking these medications    amoxicillin -clavulanate 875-125 MG tablet Commonly known as: AUGMENTIN  Stopped by:  Napolean Backbone   Wegovy  0.25 MG/0.5ML Soaj Generic drug: Semaglutide -Weight Management Replaced by: Wegovy  1.7 MG/0.75ML Soaj Stopped by: Napolean Backbone   Wegovy  0.5 MG/0.5ML Soaj Generic drug: Semaglutide -Weight Management Replaced by: Wegovy  2.4  MG/0.75ML Soaj Stopped by: Napolean Backbone   Wegovy  1 MG/0.5ML Soaj Generic drug: Semaglutide -Weight Management Stopped by: Napolean Backbone       TAKE these medications    aspirin  EC 81 MG tablet Take 1 tablet (81 mg total) by mouth daily.   atorvastatin  80 MG tablet Commonly known as: LIPITOR  Take 1 tablet (80 mg total) by mouth daily.   azelastine 0.05 % ophthalmic solution Commonly known as: OPTIVAR Place 1 drop into both eyes daily as needed (itching).   B COMPLEX 1 PO Take 1 tablet by mouth at bedtime.   budeson-glycopyrrolate -formoterol  160-9-4.8 MCG/ACT Aero inhaler Commonly known as: BREZTRI Inhale 1 puff into the lungs 2 (two) times daily.   candesartan  4 MG tablet Commonly known as: ATACAND  TAKE 1 TABLET BY MOUTH EVERY DAY   cholecalciferol  1000 units tablet Commonly known as: VITAMIN D  Take 1,000 Units by mouth 2 (two) times daily.   CO Q 10 PO Take 1 capsule by mouth daily.   diclofenac sodium 1 % Gel Commonly known as: VOLTAREN Apply 2-4 g topically daily as needed (knee pain).   levalbuterol  1.25 MG/3ML nebulizer solution Commonly known as: XOPENEX  Take 1.25 mg by nebulization every 8 (eight) hours as needed for wheezing or shortness of breath.   levocetirizine 5 MG tablet Commonly known as: XYZAL  Take 1 tablet (5 mg total) by mouth every evening.   meclizine  25 MG tablet Commonly known as: ANTIVERT  Take 1 tablet (25 mg total) by mouth 3 (three) times daily as needed for dizziness.   metoprolol  succinate 50 MG 24 hr tablet Commonly known as: TOPROL -XL TAKE 1 TABLET BY MOUTH DAILY. TAKE WITH OR IMMEDIATELY FOLLOWING A MEAL.   mometasone  50 MCG/ACT nasal spray Commonly known as: NASONEX  Place 2  sprays into the nose every morning.   montelukast  10 MG tablet Commonly known as: SINGULAIR  Take 1 tablet (10 mg total) by mouth at bedtime.   Nebulizer Devi 1 Device by Does not apply route 3 (three) times daily as needed.   nitroGLYCERIN  0.4 MG SL tablet Commonly known as: NITROSTAT  PLACE 1 TABLET UNDER THE TONGUE EVERY 5 MINUTES AS NEEDED FOR CHEST PAIN.   VITAMIN C PO Take 1 tablet by mouth daily.   Wegovy  1.7 MG/0.75ML Soaj Generic drug: Semaglutide -Weight Management Inject 1.7 mg into the skin once a week. Start taking on: Sep 03, 2023 Replaces: Wegovy  0.25 MG/0.5ML Soaj Started by: Napolean Backbone   Wegovy  2.4 MG/0.75ML Soaj Generic drug: Semaglutide -Weight Management Inject 2.4 mg into the skin once a week. Start taking on: September 20, 2023 Replaces: Wegovy  0.5 MG/0.5ML Soaj Started by: Napolean Backbone   ZINC PO Take 1 tablet by mouth at bedtime.        All past medical history, surgical history, allergies, family history, immunizations andmedications were updated in the EMR today and reviewed under the history and medication portions of their EMR.      ROS: 14 pt review of systems performed and negative (unless mentioned in an HPI)  Objective: BP 116/74   Pulse 70   Temp 98.2 F (36.8 C)   Wt 283 lb 9.6 oz (128.6 kg)   SpO2 96%   BMI 43.12 kg/m  Physical Exam Vitals and nursing note reviewed.  Constitutional:      General: She is not in acute distress.    Appearance: Normal appearance. She is not ill-appearing, toxic-appearing or diaphoretic.  HENT:     Head: Normocephalic and atraumatic.  Eyes:     General:  No scleral icterus.       Right eye: No discharge.        Left eye: No discharge.     Extraocular Movements: Extraocular movements intact.     Conjunctiva/sclera: Conjunctivae normal.     Pupils: Pupils are equal, round, and reactive to light.  Cardiovascular:     Rate and Rhythm: Normal rate and regular rhythm.  Pulmonary:     Effort: Pulmonary  effort is normal. No respiratory distress.     Breath sounds: Normal breath sounds. No wheezing, rhonchi or rales.  Musculoskeletal:     Right lower leg: No edema.     Left lower leg: No edema.  Skin:    General: Skin is warm.     Findings: No rash.  Neurological:     Mental Status: She is alert and oriented to person, place, and time. Mental status is at baseline.     Motor: No weakness.     Gait: Gait normal.  Psychiatric:        Mood and Affect: Mood normal.        Behavior: Behavior normal.        Thought Content: Thought content normal.        Judgment: Judgment normal.     No results found.  Assessment/plan: KEMBER BOCH is a 68 y.o. female present for combined chronic condition management appointment Hypertension/mixed hyperlipidemia/STEMI involving left anterior descending coronary artery (HCC)/morbid obesity Routine diet and exercise. Continue follow-ups with cardiology who manages all medications for cardiac condition-metoprolol , candesartan , Lipitor , baby aspirin , nitro   Moderate persistent asthma without complication Stable Continue Xyzal  Continue Singulair  Trial of Breztri 1 puff twice daily Prior scripts: Symbicort , Breo, Trelegy, Wixela Continue allergy follow-ups  Hyperparathyroidism (HCC)/hypercalcemia/status post parathyroidectomy 11/2022 -Continue routine follow-ups per endocrinology - PTH, calcium  and vitamin D  collected today  Morbid obesity, history of heart attack, CAD, hyperlipidemia, hypertension Patient was counseled on exercise, calorie counting. -Patient was provided with online resources for: Weekly net calorie calculator.  Applications for calorie counting.  Patient was advised to ensure she is taking in adequate nutrition daily by meeting calorie goals. -Patient was educated on dietary changes to not only lose weight but to eat healthy.  Patient was educated on glycemic index. -Patient was educated on exercise goal of 150 minutes a week  (plus warm up and cool down) of cardiovascular exercise.    -Start with 20 minutes 3 times a week and work her way up. -Patient was encouraged to maintain adequate water  consumption of at least 80 ounces a day, more if exercising/sweating. continue Wegovy  1 mg weekly for a total of 4 weeks, then increase to 1.7 weekly for 4 weeks, then increase to 2.4 weekly F/u 8 weeks   Return in about 8 weeks (around 10/04/2023) for Routine chronic condition follow-up.   No orders of the defined types were placed in this encounter.  Meds ordered this encounter  Medications   WEGOVY  1.7 MG/0.75ML SOAJ    Sig: Inject 1.7 mg into the skin once a week.    Dispense:  3 mL    Refill:  0   WEGOVY  2.4 MG/0.75ML SOAJ    Sig: Inject 2.4 mg into the skin once a week.    Dispense:  3 mL    Refill:  5   Referral Orders  No referral(s) requested today     Electronically signed by: Napolean Backbone, DO  [facility]- [place]

## 2023-08-19 ENCOUNTER — Other Ambulatory Visit: Payer: Self-pay | Admitting: Cardiology

## 2023-09-04 DIAGNOSIS — J300 Vasomotor rhinitis: Secondary | ICD-10-CM | POA: Diagnosis not present

## 2023-09-04 DIAGNOSIS — J4530 Mild persistent asthma, uncomplicated: Secondary | ICD-10-CM | POA: Diagnosis not present

## 2023-09-04 DIAGNOSIS — H1045 Other chronic allergic conjunctivitis: Secondary | ICD-10-CM | POA: Diagnosis not present

## 2023-10-03 ENCOUNTER — Other Ambulatory Visit: Payer: Self-pay | Admitting: Family Medicine

## 2023-10-03 ENCOUNTER — Ambulatory Visit: Admitting: Family Medicine

## 2023-10-03 ENCOUNTER — Encounter: Payer: Self-pay | Admitting: Family Medicine

## 2023-10-03 DIAGNOSIS — R42 Dizziness and giddiness: Secondary | ICD-10-CM

## 2023-10-03 DIAGNOSIS — J4541 Moderate persistent asthma with (acute) exacerbation: Secondary | ICD-10-CM | POA: Diagnosis not present

## 2023-10-03 MED ORDER — LEVALBUTEROL HCL 1.25 MG/3ML IN NEBU
1.2500 mg | INHALATION_SOLUTION | Freq: Three times a day (TID) | RESPIRATORY_TRACT | 11 refills | Status: DC | PRN
Start: 2023-10-03 — End: 2024-01-02

## 2023-10-03 NOTE — Progress Notes (Signed)
 Patient ID: Alyssa Phillips, female  DOB: 18-Jul-1955, 68 y.o.   MRN: [id number] Patient Care Team    Relationship Specialty Notifications Start End  Mariel Shope, DO PCP - General Family Medicine  12/04/17   Swaziland, Peter M, MD PCP - Cardiology Cardiology  10/30/22   Delilah Fend, MD (Inactive) Consulting Physician Gastroenterology  12/04/17   Liliane Rei, MD Consulting Physician Orthopedic Surgery  12/04/17   Zara Heymann, MD Referring Physician Allergy  12/12/19   [place], Peter M, MD Consulting Physician Cardiology  12/12/19   Shamleffer, Julian Obey, MD Consulting Physician Endocrinology  12/12/19     Chief Complaint  Patient presents with   Obesity    Subjective: Alyssa Phillips is a 68 y.o.  Female  present for combined chronic condition management appointment. All past medical history, surgical history, allergies, family history, immunizations, medications and social history were updated in the electronic medical record today. All recent labs, ED visits and hospitalizations within the last year were reviewed.  Moderate persistent asthma without complication Patient reports her asthma and allergies have been okay.  Asthma has not really been well-controlled since insurance stopped paying for it Symbicort . (Discussed goodrx) She has tried the Trelegy, but does not really like that medication.  She is compliant  albuterol  as needed, Xyzal  nightly, Singulair  nightly and Nasonex .  Vertigo: She is prescribed meclizine  and Zofran  for her vertigo.  She has now seen ENT and vestibular rehab.  Her vertigo occurred from December 2021 to May 2022.  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 Swaziland.  She was found to have severe single-vessel disease 95% proximal LAD treated with DES x1.  EF  45 to 50%.  Medications are managed by cardiology.   Pt reports compliance with  ASA 81, Lipitor , metoprolol , candesartan .  Patient denies chest pain, shortness of breath, dizziness or lower extremity edema.  Diet: has changed to a heart healthy diet.  RF: HTN, HLD, HD, morbid obesity, CAD/STEMI     Weight/lbs: 289.5>285>283> 277 BMI: 44>43.46>43.12> 42.24 Tolerating wegovy  1.7 weekly. No side effects. She is on her 2nd injection of this dose.  Making dietary changes and walking more.  Prior note Patient brings with her her food logs today demonstrate multiple complex carbohydrates including potatoes, breads, pasta, crackers.  She does not count calories.  She drinks approximately 64 ounces of water  a day plus unsweetened iced tea. Currently she does not exercise routinely secondary to muscle skeletal issues. She has a history of myocardial infarction/STEMI, CAD, hypertension and morbid obesity.     08/09/2023    7:46 AM 06/26/2023    8:07 AM 03/29/2023    8:14 AM 08/31/2022    9:51 AM 07/04/2022    1:06 PM  Depression screen PHQ 2/9  Decreased Interest 0 0 0 0 0  Down, Depressed, Hopeless 0 0 0 0 0  PHQ - 2 Score 0 0 0 0 0  Altered sleeping 0 1     Tired, decreased energy 1 1     Change in appetite 0 0     Feeling bad or failure about yourself  0 0     Trouble concentrating 0 0     Moving slowly or fidgety/restless 0 0     Suicidal thoughts 0 0     PHQ-9 Score 1 2  Difficult doing work/chores Not difficult at all Not difficult at all         08/09/2023    7:47 AM 06/26/2023    8:08 AM 12/11/2019    9:08 AM  GAD 7 : Generalized Anxiety Score  Nervous, Anxious, on Edge 0 0 0  Control/stop worrying 0 0 0  Worry too much - different things 0 0 0  Trouble relaxing 0 0 0  Restless 0 0 0  Easily annoyed or irritable 0 0 0  Afraid - awful might happen 0 0 0  Total GAD 7 Score 0 0 0  Anxiety Difficulty Not difficult at all Not difficult at all      Immunization History   Administered Date(s) Administered   Fluad Quad(high Dose 65+) 02/08/2021, 03/24/2022   Fluad Trivalent(High Dose 65+) 03/29/2023   Influenza Split 05/19/2008, 02/01/2010, 01/31/2011, 01/30/2012   Influenza, High Dose Seasonal PF 01/29/2018, 02/10/2019   Influenza,inj,Quad PF,6+ Mos 03/10/2015, 12/22/2016, 12/04/2017, 12/09/2018, 12/11/2019   Influenza,inj,Quad PF,6-35 Mos 05/15/2016   Influenza,inj,quad, With Preservative 01/01/2014, 12/18/2016   Influenza-Unspecified 05/15/2016   PFIZER(Purple Top)SARS-COV-2 Vaccination 06/14/2019, 07/09/2019, 11/03/2019, 01/20/2020   PNEUMOCOCCAL CONJUGATE-20 03/24/2022   Pneumococcal Polysaccharide-23 12/04/2017, 01/29/2018   Respiratory Syncytial Virus Vaccine,Recomb Aduvanted(Arexvy) 04/27/2022   Tdap 05/13/2010, 12/09/2018   Zoster Recombinant(Shingrix ) 12/11/2019, 08/01/2021    Past Medical History:  Diagnosis Date   Allergic rhinitis    Arthritis    Osteoarthrits-knees-hx. RTKA   Asthma    environmental agents induced asthma   Carpal tunnel syndrome of left wrist 02/19/2020   Carpal tunnel syndrome of right wrist 02/19/2020   Coronary artery disease    Family history of adverse reaction to anesthesia    daughter very sensitive to anesthesia   GERD (gastroesophageal reflux disease)    Heart murmur    hx. mitral valve proplapse-mostly asymptomatic-->not apprectiaed n echo   Hepatic steatosis 03/05/2008   US    History of kidney stones    Hyperlipidemia    Hypertension    Morbid obesity due to excess calories (HCC)    MRSA infection 08/2022   Pain in joint of left shoulder 06/12/2017   Pain in left foot 04/04/2018   Pain of joint of left ankle and foot 12/18/2019   Pneumonia    Primary hyperparathyroidism (HCC)    Status post parathyroidectomy 12/27/2022   STEMI (ST elevation myocardial infarction) (HCC) 10/2017   LAD treated with DESx1   UTI (urinary tract infection)    Vitamin D  deficiency    Allergies  Allergen Reactions    Compazine [Prochlorperazine Edisylate] Other (See Comments)    Eyes rolled back, tongue curled up   Advil [Ibuprofen] Hives   Aspirin  Other (See Comments)    HAS to be EC aspirin !!   Past Surgical History:  Procedure Laterality Date   5th finger surgery Right 1990   unknown injury   CARDIAC CATHETERIZATION     CARPAL TUNNEL RELEASE  08/2020   CHOLECYSTECTOMY     COLONOSCOPY WITH PROPOFOL  N/A 06/08/2016   Procedure: COLONOSCOPY WITH PROPOFOL ;  Surgeon: Delilah Fend, MD;  Location[facility] ENDOSCOPY;  Service: Endoscopy;  Laterality: N/A;   CORONARY STENT INTERVENTION N/A 11/01/2017   Procedure: CORONARY STENT INTERVENTION;  Surgeon: Swaziland, Peter M, MD;  Location: [facility] INVASIVE CV LAB;  Service: Cardiovascular;  Laterality: N/A;  Resolute Onyx 4.5 mm x12 mm   CORONARY/GRAFT ACUTE MI REVASCULARIZATION N/A 11/01/2017   Procedure: Coronary/Graft Acute MI Revascularization;  Surgeon: Swaziland, Peter M, MD;  Location: [facility]  INVASIVE CV LAB;  Service: Cardiovascular;  Laterality: N/A;   CYSTOSCOPY W/ URETERAL STENT PLACEMENT Left 02/06/2022   Procedure: CYSTOSCOPY WITH RETROGRADE PYELOGRAM/URETERAL STENT PLACEMENT;  Surgeon: Mallie Seal, MD;  Location: [facility] ORS;  Service: Urology;  Laterality: Left;   CYSTOSCOPY/URETEROSCOPY/HOLMIUM LASER/STENT PLACEMENT Left 02/22/2022   Procedure: LEFT URETEROSCOPY/HOLMIUM LASER/STENT EXCHANGE;  Surgeon: Mallie Seal, MD;  Location: [facility] ORS;  Service: Urology;  Laterality: Left;  60 MINUTES NEEDED FOR CASE   I & D EXTREMITY Left 06/30/2021   Procedure: IRRIGATION AND DEBRIDEMENT left elbow;  Surgeon: Ltanya Rummer, MD;  Location[facility] OR;  Service: Orthopedics;  Laterality: Left;   KNEE ARTHROSCOPY  05/02/2012   x2 right/ x1 left   LEFT HEART CATH AND CORONARY ANGIOGRAPHY N/A 11/01/2017   Procedure: LEFT HEART CATH AND CORONARY ANGIOGRAPHY;  Surgeon: Swaziland, Peter M, MD;  Location: [facility] INVASIVE CV LAB;  Service: Cardiovascular;  Laterality: N/A;   PARATHYROIDECTOMY Right  11/24/2022   Procedure: RIGHT PARATHYROIDECTOMY;  Surgeon: Oralee Billow, MD;  Location: [facility] ORS;  Service: General;  Laterality: Right;  89 2ND SCRUB PERSON   REPLACEMENT TOTAL KNEE Right 10/2010   ROOT CANAL     TOTAL KNEE ARTHROPLASTY  05/10/2012   Procedure: TOTAL KNEE ARTHROPLASTY;  Surgeon: Aurther Blue, MD;  Location: [facility] ORS;  Service: Orthopedics;  Laterality: Left;   TUBAL LIGATION     UMBILICAL HERNIA REPAIR     Family History  Problem Relation Age of Onset   Hypertension Mother    Arthritis Mother    Lymphoma Mother    Asthma Mother    Cancer Mother    Hearing loss Mother    Miscarriages / [place] Mother    COPD Father    Alcohol abuse Father    Arthritis Father    Hearing loss Father    Hypertension Father    Miscarriages / [place] Sister    Arthritis Maternal Grandmother    Hearing loss Maternal Grandmother    Breast cancer Maternal Grandmother 45   Arthritis Maternal Grandfather    Diabetes Maternal Grandfather    Hypertension Maternal Grandfather    Stroke Maternal Grandfather    Arthritis Paternal Grandmother    Diabetes Paternal Grandmother    Hearing loss Paternal Grandmother    Hearing loss Paternal Grandfather    Learning disabilities Paternal Grandfather    Social History   Social History Narrative   Marital status/children/pets: married   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        Medication List        Accurate as of October 03, 2023  8:02 AM. If you have any questions, ask your nurse or doctor.          STOP taking these medications    diclofenac sodium 1 % Gel Commonly  known as: VOLTAREN Stopped by: Napolean Backbone       TAKE these medications    aspirin  EC 81 MG tablet Take 1 tablet (81 mg total) by mouth daily.   atorvastatin  80 MG  tablet Commonly known as: LIPITOR  Take 1 tablet (80 mg total) by mouth daily.   azelastine 0.05 % ophthalmic solution Commonly known as: OPTIVAR Place 1 drop into both eyes daily as needed (itching).   B COMPLEX 1 PO Take 1 tablet by mouth at bedtime.   budesonide -glycopyrrolate -formoterol  160-9-4.8 MCG/ACT Aero inhaler Commonly known as: BREZTRI Inhale 1 puff into the lungs 2 (two) times daily.   candesartan  4 MG tablet Commonly known as: ATACAND  TAKE 1 TABLET BY MOUTH EVERY DAY   cholecalciferol  1000 units tablet Commonly known as: VITAMIN D  Take 1,000 Units by mouth 2 (two) times daily.   CO Q 10 PO Take 1 capsule by mouth daily.   levalbuterol  1.25 MG/3ML nebulizer solution Commonly known as: XOPENEX  Take 1.25 mg by nebulization every 8 (eight) hours as needed for wheezing or shortness of breath.   levocetirizine 5 MG tablet Commonly known as: XYZAL  Take 1 tablet (5 mg total) by mouth every evening.   meclizine  25 MG tablet Commonly known as: ANTIVERT  Take 1 tablet (25 mg total) by mouth 3 (three) times daily as needed for dizziness.   metoprolol  succinate 50 MG 24 hr tablet Commonly known as: TOPROL -XL TAKE 1 TABLET BY MOUTH DAILY. TAKE WITH OR IMMEDIATELY FOLLOWING A MEAL.   mometasone  50 MCG/ACT nasal spray Commonly known as: NASONEX  Place 2 sprays into the nose every morning.   montelukast  10 MG tablet Commonly known as: SINGULAIR  Take 1 tablet (10 mg total) by mouth at bedtime.   Nebulizer Devi 1 Device by Does not apply route 3 (three) times daily as needed.   nitroGLYCERIN  0.4 MG SL tablet Commonly known as: NITROSTAT  PLACE 1 TABLET UNDER THE TONGUE EVERY 5 MINUTES AS NEEDED FOR CHEST PAIN.   VITAMIN C PO Take 1 tablet by mouth daily.   Wegovy  2.4 MG/0.75ML Soaj Generic  drug: Semaglutide -Weight Management Inject 2.4 mg into the skin once a week. What changed: Another medication with the same name was removed. Continue taking this medication, and follow the directions you see here. Changed by: Napolean Backbone   ZINC PO Take 1 tablet by mouth at bedtime.        All past medical history, surgical history, allergies, family history, immunizations andmedications were updated in the EMR today and reviewed under the history and medication portions of their EMR.      ROS: 14 pt review of systems performed and negative (unless mentioned in an HPI)  Objective: BP 116/80   Pulse 65   Temp 97.8 F (36.6 C)   Wt 277 lb 12.8 oz (126 kg)   SpO2 97%   BMI 42.24 kg/m  Physical Exam Vitals and nursing note reviewed.  Constitutional:      General: She is not in acute distress.    Appearance: Normal appearance. She is not ill-appearing, toxic-appearing or diaphoretic.  HENT:     Head: Normocephalic and atraumatic.   Eyes:     General: No scleral icterus.       Right eye: No discharge.        Left eye: No discharge.     Extraocular Movements: Extraocular movements intact.     Conjunctiva/sclera: Conjunctivae normal.     Pupils: Pupils are equal, round, and reactive to light.    Cardiovascular:     Rate and Rhythm: Normal rate and regular rhythm.  Pulmonary:     Effort: Pulmonary effort is normal. No respiratory distress.     Breath sounds: Normal breath sounds. No wheezing,  rhonchi or rales.   Musculoskeletal:     Cervical back: Neck supple.     Right lower leg: No edema.     Left lower leg: No edema.   Skin:    General: Skin is warm.     Findings: No rash.   Neurological:     Mental Status: She is alert and oriented to person, place, and time. Mental status is at baseline.     Motor: No weakness.     Gait: Gait normal.   Psychiatric:        Mood and Affect: Mood normal.        Behavior: Behavior normal.        Thought Content: Thought  content normal.        Judgment: Judgment normal.     No results found.  Assessment/plan: Alyssa Phillips is a 68 y.o. female present for combined chronic condition management appointment Hypertension/mixed hyperlipidemia/STEMI involving left anterior descending coronary artery (HCC)/morbid obesity Routine diet and exercise. Continue follow-ups with cardiology who manages all medications for cardiac condition-metoprolol , candesartan , Lipitor , baby aspirin , nitro Labs UTD 2024  Moderate persistent asthma without complication Stable  continue  Xyzal  Continue Singulair  Trial of Breztri 1 puff twice daily(finishing breo first) Prior scripts: Symbicort , Breo, Trelegy, Wixela Continue allergy follow-ups  Hyperparathyroidism (HCC)/hypercalcemia/status post parathyroidectomy 11/2022 -Continue routine follow-ups per endocrinology - PTH, calcium  and vitamin D  UTD 03/2023  Morbid obesity, history of heart attack, CAD, hyperlipidemia, hypertension Patient was counseled on exercise, calorie counting. -Patient was provided with online resources for: Weekly net calorie calculator.  Applications for calorie counting.  Patient was advised to ensure she is taking in adequate nutrition daily by meeting calorie goals. -Patient was educated on dietary changes to not only lose weight but to eat healthy.  Patient was educated on glycemic index. -Patient was educated on exercise goal of 150 minutes a week (plus warm up and cool down) of cardiovascular exercise.    -EXERCISE:: continue 20 minutes 3 times a week and work her way up. -Patient was encouraged to maintain adequate water  consumption of at least 80 ounces a day, more if exercising/sweating.    - WATER  doing better Continue Wegovy  1.7 mg weekly for 2 more weeks, then increase 2.4 mg F/o 3 months    Return in about 12 weeks (around 12/26/2023).   No orders of the defined types were placed in this encounter.  Meds ordered this encounter   Medications   levalbuterol  (XOPENEX ) 1.25 MG/3ML nebulizer solution    Sig: Take 1.25 mg by nebulization every 8 (eight) hours as needed for wheezing or shortness of breath.    Dispense:  72 mL    Refill:  11   Referral Orders  No referral(s) requested today     Electronically signed by: Napolean Backbone, DO [facility] [place]

## 2023-10-03 NOTE — Patient Instructions (Addendum)

## 2023-11-05 DIAGNOSIS — S63591A Other specified sprain of right wrist, initial encounter: Secondary | ICD-10-CM | POA: Diagnosis not present

## 2023-11-09 DIAGNOSIS — M19072 Primary osteoarthritis, left ankle and foot: Secondary | ICD-10-CM | POA: Diagnosis not present

## 2023-11-12 ENCOUNTER — Ambulatory Visit: Payer: Self-pay

## 2023-11-12 DIAGNOSIS — S20211A Contusion of right front wall of thorax, initial encounter: Secondary | ICD-10-CM | POA: Diagnosis not present

## 2023-11-12 DIAGNOSIS — X58XXXA Exposure to other specified factors, initial encounter: Secondary | ICD-10-CM | POA: Diagnosis not present

## 2023-11-12 NOTE — Telephone Encounter (Signed)
 FYI Only or Action Required?: FYI only for provider.  Patient was last seen in primary care on 10/03/2023 by Catherine Fuller A, DO.  Called Nurse Triage reporting Fall.  Symptoms began yesterday.  Interventions attempted: Rest, hydration, or home remedies.  Symptoms are: unchanged.  Triage Disposition: See Physician Within 24 Hours  Patient/caregiver understands and will follow disposition?: Yes  Patient unable to come in for appointment tomorrow and states she will go to a walk in ortho clinic today for evaluation.    Copied from CRM [id number]. Topic: Clinical - Red Word Triage >> Nov 12, 2023  3:03 PM [place] A wrote: [facility] that prompted transfer to Nurse Triage: Patient fell yesterday and now has pain under her right breast      Reason for Disposition  [1] MODERATE pain (e.g., interferes with normal activities) AND [2] high-risk adult (e.g., age > 60 years, osteoporosis, chronic steroid use)  Answer Assessment - Initial Assessment Questions 1. MECHANISM: How did the fall happen?     Tripped 2. DOMESTIC VIOLENCE AND ELDER ABUSE SCREENING: Did you fall because someone pushed you or tried to hurt you? If Yes, ask: Are you safe now?     No 3. ONSET: When did the fall happen? (e.g., minutes, hours, or days ago)     Yesterday  4. LOCATION: What part of the body hit the ground? (e.g., back, buttocks, head, hips, knees, hands, head, stomach)     Fell onto knees and rolled down hill 5. INJURY: Did you hurt (injure) yourself when you fell? If Yes, ask: What did you injure? Tell me more about this? (e.g., body area; type of injury; pain severity)     Pain in right sided ribs under right breast  6. PAIN: Is there any pain? If Yes, ask: How bad is the pain? (e.g., Scale 0-10; or none, mild,      6/10 9. OTHER SYMPTOMS: Do you have any other symptoms? (e.g., dizziness, fever, weakness; new-onset or worsening).      No 10. CAUSE: What do you think caused the  fall (or falling)? (e.g., dizzy spell, tripped)       Tripped  Answer Assessment - Initial Assessment Questions 1. MECHANISM: How did the injury happen?     Fall 2. ONSET: When did the injury happen? (.e.g., minutes, hours, days ago)     Yesterday  3. LOCATION: Where on the chest is the injury located?     Right sided ribs under the breast  4. APPEARANCE: What does the injury look like?     No bruising  5. BLEEDING: Is there any bleeding now? If Yes, ask: How long has it been bleeding?     No bleeding  6. SEVERITY: Any difficulty with breathing?     No 7. SIZE: For cuts, bruises, or swelling, ask: How large is it? (e.g., inches or centimeters)     N/A 8. PAIN: Is there pain? If Yes, ask: How bad is the pain? (e.g., Scale 0-10; none, mild, moderate, severe)     6/10 9. TETANUS: For any breaks in the skin, ask: When was your last tetanus booster?     N/A  Protocols used: Falls and Falling-A-AH, Chest Injury-A-AH

## 2023-12-26 ENCOUNTER — Telehealth: Payer: Self-pay

## 2023-12-26 NOTE — Telephone Encounter (Signed)
 I am uncertain how to give her a copy of her prescription through MyChart.  She could attempt to print out her prescription list from her MyChart which would have all of her medicines listed.   She could contact her pharmacy to see if they can provide her with a verification She could pack her injection in her regular bag wrap and follow-up/insulation to help keep holding in a place in the refrigerator when she gets to where she is going. If she is only gone for a week, she could skip the injection and do it when she comes home.  This is all I can think up to do all my and and she can end and she print this out?   WEGOVY  2.4 MG/0.75ML SOAJ 2.4 mg, Weekly 5 ordered        Summary: Inject 2.4 mg into the skin once a week., Starting Thu 09/20/2023, Normal Dose, Route, Frequency: 2.4 mg, Subcutaneous, WeeklyStart: 06/05/2025Ordered On: 04/24/2025Pharmacy: [id number] - [place], [place] - [address]ReportDx Associated: Taking: Long-term: Med Note:        Change Discontinue DAW: Dispense as Written Directions: Inject 2.4 mg into the skin once a week. Ordering Department: [facility] Authorized By: Alim Cattell A, DO Dispense: 3 mL

## 2023-12-26 NOTE — Telephone Encounter (Signed)
 Copied from CRM [id number]. Topic: General - Other >> Dec 25, 2023  1:59 PM Rosina BIRCH wrote: Reason for CRM: patient called stating she need a copy of her prescription for wegovy  because it is required in order for her to take it on the plane Thursday. Patient want to know if she can get the copy through mychart CB [phone]

## 2023-12-27 NOTE — Telephone Encounter (Signed)
 Medication report printed and emailed to pt. MyChart sent.

## 2023-12-28 ENCOUNTER — Ambulatory Visit: Admitting: Family Medicine

## 2024-01-02 ENCOUNTER — Encounter: Payer: Self-pay | Admitting: Family Medicine

## 2024-01-02 ENCOUNTER — Ambulatory Visit: Admitting: Family Medicine

## 2024-01-02 DIAGNOSIS — Z23 Encounter for immunization: Secondary | ICD-10-CM

## 2024-01-02 DIAGNOSIS — R42 Dizziness and giddiness: Secondary | ICD-10-CM

## 2024-01-02 DIAGNOSIS — J4540 Moderate persistent asthma, uncomplicated: Secondary | ICD-10-CM | POA: Diagnosis not present

## 2024-01-02 DIAGNOSIS — E559 Vitamin D deficiency, unspecified: Secondary | ICD-10-CM

## 2024-01-02 DIAGNOSIS — J301 Allergic rhinitis due to pollen: Secondary | ICD-10-CM

## 2024-01-02 DIAGNOSIS — J4541 Moderate persistent asthma with (acute) exacerbation: Secondary | ICD-10-CM

## 2024-01-02 DIAGNOSIS — E213 Hyperparathyroidism, unspecified: Secondary | ICD-10-CM

## 2024-01-02 DIAGNOSIS — I10 Essential (primary) hypertension: Secondary | ICD-10-CM

## 2024-01-02 DIAGNOSIS — Z6841 Body Mass Index (BMI) 40.0 and over, adult: Secondary | ICD-10-CM

## 2024-01-02 DIAGNOSIS — I252 Old myocardial infarction: Secondary | ICD-10-CM

## 2024-01-02 DIAGNOSIS — E782 Mixed hyperlipidemia: Secondary | ICD-10-CM

## 2024-01-02 DIAGNOSIS — H8110 Benign paroxysmal vertigo, unspecified ear: Secondary | ICD-10-CM

## 2024-01-02 DIAGNOSIS — I2510 Atherosclerotic heart disease of native coronary artery without angina pectoris: Secondary | ICD-10-CM

## 2024-01-02 MED ORDER — LEVALBUTEROL HCL 1.25 MG/3ML IN NEBU: 1.2500 mg | INHALATION_SOLUTION | Freq: Three times a day (TID) | RESPIRATORY_TRACT | 11 refills | Status: AC | PRN

## 2024-01-02 MED ORDER — MOMETASONE FUROATE 50 MCG/ACT NA SUSP: 2.0000 | Freq: Every morning | NASAL | 11 refills | Status: AC

## 2024-01-02 MED ORDER — MECLIZINE HCL 25 MG PO TABS: 25.0000 mg | ORAL_TABLET | Freq: Three times a day (TID) | ORAL | 1 refills | Status: DC | PRN

## 2024-01-02 MED ORDER — MONTELUKAST SODIUM 10 MG PO TABS: 10.0000 mg | ORAL_TABLET | Freq: Every day | ORAL | 3 refills | Status: DC

## 2024-01-02 MED ORDER — LEVOCETIRIZINE DIHYDROCHLORIDE 5 MG PO TABS: 5.0000 mg | ORAL_TABLET | Freq: Every evening | ORAL | 3 refills | Status: DC

## 2024-01-02 MED ORDER — WEGOVY 2.4 MG/0.75ML ~~LOC~~ SOAJ: 2.4000 mg | SUBCUTANEOUS | 5 refills | Status: DC

## 2024-01-02 NOTE — Progress Notes (Signed)
 Patient ID: Alyssa Phillips, female  DOB: 1955-05-26, 68 y.o.   MRN: [id number] Patient Care Team    Relationship Specialty Notifications Start End  Catherine Charlies LABOR, DO PCP - General Family Medicine  12/04/17   [place], Peter M, MD PCP - Cardiology Cardiology  10/30/22   Dyane Rush, MD (Inactive) Consulting Physician Gastroenterology  12/04/17   Melodi Lerner, MD Consulting Physician Orthopedic Surgery  12/04/17   Frutoso Luz, MD Referring Physician Allergy  12/12/19   [place], Peter M, MD Consulting Physician Cardiology  12/12/19   Shamleffer, Donell Cardinal, MD Consulting Physician Endocrinology  12/12/19     Chief Complaint  Patient presents with   Hypertension   Hyperlipidemia   Asthma    Chronic Conditions/illness Management Influenza vaccine    Subjective: Alyssa Phillips is a 68 y.o.  Female  present for chronic condition management appointment. All past medical history, surgical history, allergies, family history, immunizations, medications and social history were updated in the electronic medical record today. All recent labs, ED visits and hospitalizations within the last year were reviewed.  Moderate persistent asthma without complication Patient reports her asthma and allergies have been good.     Alyssa Phillips felt Singulair  worked the best for her.  Alyssa Phillips is compliant with Trelegy.  Alyssa Phillips is compliant  albuterol  as needed, Xyzal  nightly, Singulair  nightly and Nasonex .   Vertigo: Alyssa Phillips is prescribed meclizine  and Zofran  for her vertigo.  Alyssa Phillips has now seen ENT and vestibular rehab.  Her vertigo was from December 2021 to May 2022.  It then resolved with with vestibular rehab.  With occasional reoccurrence.  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 Swaziland.  Alyssa Phillips was found to have severe single-vessel disease but 95% proximal LAD treated with DES x1.  EF 45 to 50%.   Medications are managed by cardiology.   Pt reports compliance with  ASA 81, Lipitor , metoprolol , candesartan .  Patient denies chest pain, shortness of breath, dizziness or lower extremity edema.   Diet: has changed to a heart healthy diet.  RF: HTN, HLD, HD, morbid obesity, CAD/STEMI     Weight/lbs: 289.5>285>283>277 BMI: 44>43.46>43.1242.57  Tolerating Wegovy  2.4 mg weekly. No side effects.  Making dietary changes and walking more.   Prior note Patient brings with her her food logs today demonstrate multiple complex carbohydrates including potatoes, breads, pasta, crackers.  Alyssa Phillips does not count calories.  Alyssa Phillips drinks approximately 64 ounces of water  a day plus unsweetened iced tea. Currently Alyssa Phillips does not exercise routinely secondary to muscle skeletal issues. Alyssa Phillips has a history of myocardial infarction/STEMI, CAD, hypertension and morbid obesity.     08/09/2023    7:46 AM 06/26/2023    8:07 AM 03/29/2023    8:14 AM 08/31/2022    9:51 AM 07/04/2022    1:06 PM  Depression screen PHQ 2/9  Decreased Interest 0 0 0 0 0  Down, Depressed, Hopeless 0 0 0 0 0  PHQ - 2 Score 0 0 0 0 0  Altered sleeping 0 1     Tired, decreased energy 1 1     Change in appetite 0 0     Feeling bad or failure about yourself  0 0     Trouble concentrating 0 0     Moving slowly or fidgety/restless 0 0     Suicidal thoughts 0 0     PHQ-9 Score 1 2  Difficult doing work/chores Not difficult at all Not difficult at all         08/09/2023    7:47 AM 06/26/2023    8:08 AM 12/11/2019    9:08 AM  GAD 7 : Generalized Anxiety Score  Nervous, Anxious, on Edge 0 0 0  Control/stop worrying 0 0 0  Worry too much - different things 0 0 0  Trouble relaxing 0 0 0  Restless 0 0 0  Easily annoyed or irritable 0 0 0  Afraid - awful might happen 0 0 0  Total GAD 7 Score 0 0 0  Anxiety Difficulty Not difficult at all Not difficult at all      Immunization History  Administered Date(s) Administered   Fluad  Quad(high Dose 65+) 02/08/2021, 03/24/2022   Fluad Trivalent(High Dose 65+) 03/29/2023   INFLUENZA, HIGH DOSE SEASONAL PF 01/29/2018, 02/10/2019, 01/02/2024   Influenza Split 05/19/2008, 02/01/2010, 01/31/2011, 01/30/2012   Influenza,inj,Quad PF,6+ Mos 03/10/2015, 12/22/2016, 12/04/2017, 12/09/2018, 12/11/2019   Influenza,inj,Quad PF,6-35 Mos 05/15/2016   Influenza,inj,quad, With Preservative 01/01/2014, 12/18/2016   Influenza-Unspecified 05/15/2016   PFIZER(Purple Top)SARS-COV-2 Vaccination 06/14/2019, 07/09/2019, 11/03/2019, 01/20/2020   PNEUMOCOCCAL CONJUGATE-20 03/24/2022   Pneumococcal Polysaccharide-23 12/04/2017, 01/29/2018, 08/24/2021   Respiratory Syncytial Virus Vaccine,Recomb Aduvanted(Arexvy) 04/27/2022   Tdap 05/13/2010, 12/09/2018   Zoster Recombinant(Shingrix ) 12/11/2019, 08/01/2021    Past Medical History:  Diagnosis Date   Allergic rhinitis    Arthritis    Osteoarthrits-knees-hx. RTKA   Asthma    environmental agents induced asthma   Carpal tunnel syndrome of left wrist 02/19/2020   Carpal tunnel syndrome of right wrist 02/19/2020   Coronary artery disease    Family history of adverse reaction to anesthesia    daughter very sensitive to anesthesia   GERD (gastroesophageal reflux disease)    Heart murmur    hx. mitral valve proplapse-mostly asymptomatic-->not apprectiaed n echo   Hepatic steatosis 03/05/2008   US    History of kidney stones    Hyperlipidemia    Hypertension    Morbid obesity due to excess calories (HCC)    MRSA infection 08/2022   Pain in joint of left shoulder 06/12/2017   Pain in left foot 04/04/2018   Pain of joint of left ankle and foot 12/18/2019   Pneumonia    Primary hyperparathyroidism (HCC)    Status post parathyroidectomy 12/27/2022   STEMI (ST elevation myocardial infarction) (HCC) 10/2017   LAD treated with DESx1   UTI (urinary tract infection)    Vitamin D  deficiency    Allergies  Allergen Reactions   Compazine  [Prochlorperazine Edisylate] Other (See Comments)    Eyes rolled back, tongue curled up   Advil [Ibuprofen] Hives   Aspirin  Other (See Comments)    HAS to be EC aspirin !!   Past Surgical History:  Procedure Laterality Date   5th finger surgery Right 1990   unknown injury   CARDIAC CATHETERIZATION     CARPAL TUNNEL RELEASE  08/2020   CHOLECYSTECTOMY     COLONOSCOPY WITH PROPOFOL  N/A 06/08/2016   Procedure: COLONOSCOPY WITH PROPOFOL ;  Surgeon: Norleen Hint, MD;  Location: [facility] ENDOSCOPY;  Service: Endoscopy;  Laterality: N/A;   CORONARY STENT INTERVENTION N/A 11/01/2017   Procedure: CORONARY STENT INTERVENTION;  Surgeon: Swaziland, Peter M, MD;  Location: [facility] INVASIVE CV LAB;  Service: Cardiovascular;  Laterality: N/A;  Resolute Onyx 4.5 mm x12 mm   CORONARY/GRAFT ACUTE MI REVASCULARIZATION N/A 11/01/2017   Procedure: Coronary/Graft Acute MI Revascularization;  Surgeon: Swaziland, Peter M, MD;  Location: [facility] INVASIVE CV LAB;  Service: Cardiovascular;  Laterality: N/A;   CYSTOSCOPY W/ URETERAL STENT PLACEMENT Left 02/06/2022   Procedure: CYSTOSCOPY WITH RETROGRADE PYELOGRAM/URETERAL STENT PLACEMENT;  Surgeon: Lovie Arlyss CROME, MD;  Location: [facility] ORS;  Service: Urology;  Laterality: Left;   CYSTOSCOPY/URETEROSCOPY/HOLMIUM LASER/STENT PLACEMENT Left 02/22/2022   Procedure: LEFT URETEROSCOPY/HOLMIUM LASER/STENT EXCHANGE;  Surgeon: Lovie Arlyss CROME, MD;  Location: [facility] ORS;  Service: Urology;  Laterality: Left;  60 MINUTES NEEDED FOR CASE   I & D EXTREMITY Left 06/30/2021   Procedure: IRRIGATION AND DEBRIDEMENT left elbow;  Surgeon: Alyse Agent, MD;  Location: [facility] OR;  Service: Orthopedics;  Laterality: Left;   KNEE ARTHROSCOPY  05/02/2012   x2 right/ x1 left   LEFT HEART CATH AND CORONARY ANGIOGRAPHY N/A 11/01/2017   Procedure: LEFT HEART CATH AND CORONARY ANGIOGRAPHY;  Surgeon: Swaziland, Peter M, MD;  Location: [facility] INVASIVE CV LAB;  Service: Cardiovascular;  Laterality: N/A;   PARATHYROIDECTOMY Right 11/24/2022    Procedure: RIGHT PARATHYROIDECTOMY;  Surgeon: Eletha Boas, MD;  Location: [facility] ORS;  Service: General;  Laterality: Right;  89 2ND SCRUB PERSON   REPLACEMENT TOTAL KNEE Right 10/2010   ROOT CANAL     TOTAL KNEE ARTHROPLASTY  05/10/2012   Procedure: TOTAL KNEE ARTHROPLASTY;  Surgeon: Dempsey LULLA Moan, MD;  Location: [facility] ORS;  Service: Orthopedics;  Laterality: Left;   TUBAL LIGATION     UMBILICAL HERNIA REPAIR     Family History  Problem Relation Age of Onset   Hypertension Mother    Arthritis Mother    Lymphoma Mother    Asthma Mother    Cancer Mother    Hearing loss Mother    Miscarriages / [place] Mother    COPD Father    Alcohol abuse Father    Arthritis Father    Hearing loss Father    Hypertension Father    Miscarriages / [place] Sister    Arthritis Maternal Grandmother    Hearing loss Maternal Grandmother    Breast cancer Maternal Grandmother 32   Arthritis Maternal Grandfather    Diabetes Maternal Grandfather    Hypertension Maternal Grandfather    Stroke Maternal Grandfather    Arthritis Paternal Grandmother    Diabetes Paternal Grandmother    Hearing loss Paternal Grandmother    Hearing loss Paternal Grandfather    Learning disabilities Paternal Grandfather    Social History   Social History Narrative   Marital status/children/pets: married   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        Medication List        Accurate as of January 02, 2024  8:54 AM. If you have any questions, ask your nurse or doctor.          aspirin  EC 81 MG tablet Take 1 tablet (81 mg total) by mouth daily.    atorvastatin  80 MG tablet Commonly known as: LIPITOR  Take 1 tablet (80 mg total) by mouth daily.   azelastine 0.05 % ophthalmic solution Commonly known as: OPTIVAR Place 1 drop into both  eyes daily as needed (itching).   B COMPLEX 1 PO Take 1 tablet by mouth at bedtime.   budesonide -glycopyrrolate -formoterol  160-9-4.8 MCG/ACT Aero inhaler Commonly known as: BREZTRI Inhale 1 puff into the lungs 2 (two) times daily.   candesartan  4 MG tablet Commonly known as: ATACAND  TAKE 1 TABLET BY MOUTH EVERY DAY   cholecalciferol  1000 units tablet Commonly known as: VITAMIN D  Take 1,000 Units by mouth 2 (two) times daily.   CO Q 10 PO Take 1 capsule by mouth daily.   levalbuterol  1.25 MG/3ML nebulizer solution Commonly known as: XOPENEX  Take 1.25 mg by nebulization every 8 (eight) hours as needed for wheezing or shortness of breath.   levocetirizine 5 MG tablet Commonly known as: XYZAL  Take 1 tablet (5 mg total) by mouth every evening.   meclizine  25 MG tablet Commonly known as: ANTIVERT  Take 1 tablet (25 mg total) by mouth 3 (three) times daily as needed for dizziness.   metoprolol  succinate 50 MG 24 hr tablet Commonly known as: TOPROL -XL TAKE 1 TABLET BY MOUTH DAILY. TAKE WITH OR IMMEDIATELY FOLLOWING A MEAL.   mometasone  50 MCG/ACT nasal spray Commonly known as: NASONEX  Place 2 sprays into the nose every morning.   montelukast  10 MG tablet Commonly known as: SINGULAIR  Take 1 tablet (10 mg total) by mouth at bedtime.   Nebulizer Devi 1 Device by Does not apply route 3 (three) times daily as needed.   nitroGLYCERIN  0.4 MG SL tablet Commonly known as: NITROSTAT  PLACE 1 TABLET UNDER THE TONGUE EVERY 5 MINUTES AS NEEDED FOR CHEST PAIN.   VITAMIN C PO Take 1 tablet by mouth daily.   Wegovy  2.4 MG/0.75ML Soaj SQ injection Generic drug: semaglutide -weight management Inject 2.4 mg into the skin once a week.   ZINC PO Take 1 tablet by mouth at bedtime.        All  past medical history, surgical history, allergies, family history, immunizations andmedications were updated in the EMR today and reviewed under the history and medication portions of their EMR.      ROS: 14 pt review of systems performed and negative (unless mentioned in an HPI)  Objective: BP 112/72   Pulse 84   Temp 98.1 F (36.7 C)   Wt 280 lb (127 kg)   SpO2 96%   BMI 42.57 kg/m  Physical Exam Vitals and nursing note reviewed.  Constitutional:      General: Alyssa Phillips is not in acute distress.    Appearance: Normal appearance. Alyssa Phillips is not ill-appearing, toxic-appearing or diaphoretic.  HENT:     Head: Normocephalic and atraumatic.  Eyes:     General: No scleral icterus.       Right eye: No discharge.        Left eye: No discharge.     Extraocular Movements: Extraocular movements intact.     Conjunctiva/sclera: Conjunctivae normal.     Pupils: Pupils are equal, round, and reactive to light.  Cardiovascular:     Rate and Rhythm: Normal rate and regular rhythm.  Pulmonary:     Effort: Pulmonary effort is normal. No respiratory distress.     Breath sounds: Normal breath sounds. No wheezing, rhonchi or rales.  Musculoskeletal:     Right lower leg: No edema.     Left lower leg: No edema.  Skin:    General: Skin is warm.     Findings: No rash.  Neurological:     Mental Status: Alyssa Phillips is alert and oriented to person, place, and time. Mental  status is at baseline.     Motor: No weakness.     Gait: Gait normal.  Psychiatric:        Mood and Affect: Mood normal.        Behavior: Behavior normal.        Thought Content: Thought content normal.        Judgment: Judgment normal.     No results found.  Assessment/plan: ZALEY TALLEY is a 68 y.o. female present for combined chronic condition management appointment Hypertension/mixed hyperlipidemia/STEMI involving left anterior descending coronary artery (HCC)/morbid obesity Routine diet and exercise. Continue follow-ups with  cardiology who manages all medications for cardiac condition-metoprolol , candesartan , Lipitor , baby aspirin , nitro   Moderate persistent asthma without complication Stable Continue Xyzal  Continue Singulair  Trial ofBreztri 1 puff twice daily Prior scripts: Symbicort , Breo, Trelegy, Wixela Continue allergy follow-ups  Hyperparathyroidism (HCC)/hypercalcemia/status post parathyroidectomy 11/2022 -Continue routine follow-ups per endocrinology - PTH, calcium  and vitamin D  UTD  Morbid obesity, history of heart attack, CAD, hyperlipidemia, hypertension Patient was counseled on exercise, calorie counting. -Patient was provided with online resources for: Weekly net calorie calculator.  Applications for calorie counting.  Patient was advised to ensure Alyssa Phillips is taking in adequate nutrition daily by meeting calorie goals. -Patient was educated on dietary changes to not only lose weight but to eat healthy.  Patient was educated on glycemic index. -Patient was educated on exercise goal of 150 minutes a week (plus warm up and cool down) of cardiovascular exercise.    -Start with 20 minutes 3 times a week and work her way up. -Patient was encouraged to maintain adequate water  consumption of at least 80 ounces a day, more if exercising/sweating. continue Wegovy  2.4 weekly    Return in about 4 months (around 04/21/2024) for cpe (20 min), Routine chronic condition follow-up.   Orders Placed This Encounter  Procedures   Flu vaccine HIGH DOSE PF(Fluzone Trivalent)   Meds ordered this encounter  Medications   montelukast  (SINGULAIR ) 10 MG tablet    Sig: Take 1 tablet (10 mg total) by mouth at bedtime.    Dispense:  90 tablet    Refill:  3   WEGOVY  2.4 MG/0.75ML SOAJ SQ injection    Sig: Inject 2.4 mg into the skin once a week.    Dispense:  3 mL    Refill:  5   mometasone  (NASONEX ) 50 MCG/ACT nasal spray    Sig: Place 2 sprays into the nose every morning.    Dispense:  17 g    Refill:  11    levocetirizine (XYZAL ) 5 MG tablet    Sig: Take 1 tablet (5 mg total) by mouth every evening.    Dispense:  90 tablet    Refill:  3   meclizine  (ANTIVERT ) 25 MG tablet    Sig: Take 1 tablet (25 mg total) by mouth 3 (three) times daily as needed for dizziness.    Dispense:  60 tablet    Refill:  1   levalbuterol  (XOPENEX ) 1.25 MG/3ML nebulizer solution    Sig: Take 1.25 mg by nebulization every 8 (eight) hours as needed for wheezing or shortness of breath.    Dispense:  72 mL    Refill:  11   Referral Orders  No referral(s) requested today     Electronically signed by: Charlies Bellini, DO [facility]

## 2024-01-02 NOTE — Patient Instructions (Addendum)
 Return in about 4 months (around 04/21/2024) for cpe (20 min), Routine chronic condition follow-up. Labs will be collected next visit        Great to see you today.  I have refilled the medication(s) we provide.   If labs were collected or images ordered, we will inform you of  results once we have received them and reviewed. We will contact you either by echart message, or telephone call.  Please give ample time to the testing facility, and our office to run,  receive and review results. Please do not call inquiring of results, even if you can see them in your chart. We will contact you as soon as we are able. If it has been over 1 week since the test was completed, and you have not yet heard from us , then please call us .    - echart message- for normal results that have been seen by the patient already.   - telephone call: abnormal results or if patient has not viewed results in their echart.  If a referral to a specialist was entered for you, please call us  in 2 weeks if you have not heard from the specialist office to schedule.

## 2024-01-11 ENCOUNTER — Ambulatory Visit: Payer: Self-pay

## 2024-01-11 NOTE — Telephone Encounter (Signed)
 FYI Only or Action Required?: FYI only for provider.  Patient was last seen in primary care on 01/02/2024 by Catherine Fuller A, DO.  Called Nurse Triage reporting Elbow Injury.  Symptoms began several days ago.  Interventions attempted: Nothing.  Symptoms are: unchanged.  Triage Disposition: See PCP When Office is Open (Within 3 Days)  Patient/caregiver understands and will follow disposition?: yes         Copied from CRM #[id number]. Topic: Clinical - Red Word Triage >> Jan 11, 2024  8:25 AM Alfonso HERO wrote: Red Word that prompted transfer to Nurse Triage: Patient complaining of pain and tenderness in her right elbow. Feels there may be some infection. Reason for Disposition  [1] MODERATE pain (e.g., interferes with normal activities) AND [2] present > 3 days  Answer Assessment - Initial Assessment Questions 1. MECHANISM: How did the injury happen?     Knot on elbow  2. ONSET: When did the injury happen? (e.g., minutes, hours ago)      N/a 3. LOCATION: What part of the elbow is injured?      Right elbow  4. APPEARANCE of INJURY: What does the injury look like?      Round dry area on the elbow and skin  5. SEVERITY: Can you use the elbow normally?  Can you bend it and straighten it fully?     *No Answer* 6. SIZE: For cuts, bruises, or swelling, ask: How large is it? (e.g., inches or centimeters; entire joint)      *No Answer* 7. PAIN: Is there pain? If Yes, ask: How bad is the pain?  (Scale 0-10; or none, mild, moderate, severe)     2/10 8. TETANUS: For any breaks in the skin, ask: When was your last tetanus booster?     *No Answer* 9. OTHER SYMPTOMS: Do you have any other symptoms?  (e.g., numbness in hand)     no 10. PREGNANCY: Is there any chance you are pregnant? When was your last menstrual period?       *No Answer*  Answer Assessment - Initial Assessment Questions 1. ONSET: When did the pain start?     Past few days  2. LOCATION: Where  is the pain located?     Left elbow 3. PAIN: How bad is the pain? (Scale 1-10; or mild, moderate, severe)     mild 4. WORK OR EXERCISE: Has there been any recent work or exercise that involved this part of the body?     no 5. CAUSE: What do you think is causing the elbow pain?     Worried has infection  6. OTHER SYMPTOMS: Do you have any other symptoms? (e.g., neck pain, elbow swelling, rash, fever)     Swollen lump  Protocols used: Elbow Injury-A-AH, Elbow Pain-A-AH

## 2024-01-14 ENCOUNTER — Ambulatory Visit: Payer: Self-pay

## 2024-01-14 NOTE — Telephone Encounter (Signed)
         FYI Only or Action Required?: Action required by provider: Request for a antibiotic .  Patient was last seen in primary care on 01/02/2024 by Catherine Fuller A, DO.  Called Nurse Triage reporting Blister.  Symptoms began several days ago.  Interventions attempted: Nothing.  Symptoms are: gradually worsening.  Triage Disposition: See Physician Within 24 Hours  Patient/caregiver understands and will follow disposition?: No, wishes to speak with PCP   Copied from CRM #[id number]. Topic: Clinical - Red Word Triage >> Jan 14, 2024 10:19 AM Alfonso HERO wrote: Red Word that prompted transfer to Nurse Triage: Patient has a blister on her elbow that has ruptured and wants to make an appt. She thinks it may be an infection. Reason for Disposition  Looks like a boil or deep ulcer  Answer Assessment - Initial Assessment Questions Patient offered an appointment in office and refused. Patient requesting if an antibiotic can be called in for her symptoms. Also requesting a follow up Mychart message regarding request. Preferred pharmacy below.   [id number] - [place], [place] - [address]  [address] [place], [place] [address]      1. APPEARANCE of SORES: What do the sores look like?     She states it looks like a boil that has ruptured with yellow pus coming out of the area.   2. NUMBER: How many sores are there?     1 3. SIZE: How big is the largest sore?     The size of the tip of a pinky finger  4. LOCATION: Where are the sores located?     On R elbow  5. ONSET: When did the sores begin?     Friday  6. TENDER: Does it hurt when you touch it?  (Scale 1-10; or mild, moderate, severe)      She states the area is sensitive and tender.  7. OTHER SYMPTOMS: Do you have any other symptoms? (e.g., fever, new weakness)     Denies  Protocols used: Sores-A-AH

## 2024-01-20 ENCOUNTER — Other Ambulatory Visit: Payer: Self-pay | Admitting: Family Medicine

## 2024-03-26 DIAGNOSIS — M19072 Primary osteoarthritis, left ankle and foot: Secondary | ICD-10-CM | POA: Diagnosis not present

## 2024-03-31 ENCOUNTER — Encounter: Payer: Self-pay | Admitting: Family Medicine

## 2024-03-31 ENCOUNTER — Ambulatory Visit: Payer: BC Managed Care – PPO | Admitting: Family Medicine

## 2024-03-31 ENCOUNTER — Ambulatory Visit: Payer: Self-pay | Admitting: Family Medicine

## 2024-03-31 VITALS — BP 125/78 | HR 78 | Temp 98.2°F | Ht 67.0 in | Wt 259.2 lb

## 2024-03-31 DIAGNOSIS — J301 Allergic rhinitis due to pollen: Secondary | ICD-10-CM | POA: Diagnosis not present

## 2024-03-31 DIAGNOSIS — I252 Old myocardial infarction: Secondary | ICD-10-CM

## 2024-03-31 DIAGNOSIS — I10 Essential (primary) hypertension: Secondary | ICD-10-CM | POA: Diagnosis not present

## 2024-03-31 DIAGNOSIS — R42 Dizziness and giddiness: Secondary | ICD-10-CM

## 2024-03-31 DIAGNOSIS — E213 Hyperparathyroidism, unspecified: Secondary | ICD-10-CM

## 2024-03-31 DIAGNOSIS — Z0000 Encounter for general adult medical examination without abnormal findings: Secondary | ICD-10-CM | POA: Diagnosis not present

## 2024-03-31 DIAGNOSIS — E559 Vitamin D deficiency, unspecified: Secondary | ICD-10-CM | POA: Diagnosis not present

## 2024-03-31 DIAGNOSIS — M8589 Other specified disorders of bone density and structure, multiple sites: Secondary | ICD-10-CM

## 2024-03-31 DIAGNOSIS — I2510 Atherosclerotic heart disease of native coronary artery without angina pectoris: Secondary | ICD-10-CM

## 2024-03-31 DIAGNOSIS — Z131 Encounter for screening for diabetes mellitus: Secondary | ICD-10-CM | POA: Diagnosis not present

## 2024-03-31 DIAGNOSIS — J4540 Moderate persistent asthma, uncomplicated: Secondary | ICD-10-CM

## 2024-03-31 DIAGNOSIS — E782 Mixed hyperlipidemia: Secondary | ICD-10-CM

## 2024-03-31 DIAGNOSIS — Z1231 Encounter for screening mammogram for malignant neoplasm of breast: Secondary | ICD-10-CM

## 2024-03-31 DIAGNOSIS — Z6841 Body Mass Index (BMI) 40.0 and over, adult: Secondary | ICD-10-CM

## 2024-03-31 DIAGNOSIS — Z8639 Personal history of other endocrine, nutritional and metabolic disease: Secondary | ICD-10-CM

## 2024-03-31 LAB — CBC
HCT: 42.9 % (ref 36.0–46.0)
Hemoglobin: 14.1 g/dL (ref 12.0–15.0)
MCHC: 32.7 g/dL (ref 30.0–36.0)
MCV: 91.2 fl (ref 78.0–100.0)
Platelets: 232 K/uL (ref 150.0–400.0)
RBC: 4.71 Mil/uL (ref 3.87–5.11)
RDW: 14.2 % (ref 11.5–15.5)
WBC: 5.8 K/uL (ref 4.0–10.5)

## 2024-03-31 LAB — COMPREHENSIVE METABOLIC PANEL WITH GFR
ALT: 16 U/L (ref 0–35)
AST: 17 U/L (ref 0–37)
Albumin: 4.1 g/dL (ref 3.5–5.2)
Alkaline Phosphatase: 51 U/L (ref 39–117)
BUN: 18 mg/dL (ref 6–23)
CO2: 31 meq/L (ref 19–32)
Calcium: 9.3 mg/dL (ref 8.4–10.5)
Chloride: 106 meq/L (ref 96–112)
Creatinine, Ser: 0.67 mg/dL (ref 0.40–1.20)
GFR: 89.37 mL/min (ref >60.00)
Glucose, Bld: 69 mg/dL — ABNORMAL LOW (ref 70–99)
Potassium: 4 meq/L (ref 3.5–5.1)
Sodium: 142 meq/L (ref 135–145)
Total Bilirubin: 0.6 mg/dL (ref 0.2–1.2)
Total Protein: 6.2 g/dL (ref 6.0–8.3)

## 2024-03-31 LAB — LIPID PANEL
Cholesterol: 113 mg/dL (ref 0–200)
HDL: 47.9 mg/dL (ref >39.00)
LDL Cholesterol: 53 mg/dL (ref 0–99)
NonHDL: 65.49
Total CHOL/HDL Ratio: 2
Triglycerides: 64 mg/dL (ref 0.0–149.0)
VLDL: 12.8 mg/dL (ref 0.0–40.0)

## 2024-03-31 LAB — VITAMIN D 25 HYDROXY (VIT D DEFICIENCY, FRACTURES): VITD: 45.46 ng/mL (ref 30.00–100.00)

## 2024-03-31 LAB — TSH: TSH: 2.75 u[IU]/mL (ref 0.35–5.50)

## 2024-03-31 LAB — HEMOGLOBIN A1C: Hgb A1c MFr Bld: 5.3 % (ref 4.6–6.5)

## 2024-03-31 MED ORDER — LEVOCETIRIZINE DIHYDROCHLORIDE 5 MG PO TABS: 5.0000 mg | ORAL_TABLET | Freq: Every evening | ORAL | 3 refills | Status: AC

## 2024-03-31 MED ORDER — WEGOVY 2.4 MG/0.75ML ~~LOC~~ SOAJ: 2.4000 mg | SUBCUTANEOUS | 5 refills | Status: AC

## 2024-03-31 MED ORDER — MONTELUKAST SODIUM 10 MG PO TABS: 10.0000 mg | ORAL_TABLET | Freq: Every day | ORAL | 3 refills | Status: AC

## 2024-03-31 MED ORDER — MECLIZINE HCL 25 MG PO TABS: 25.0000 mg | ORAL_TABLET | Freq: Three times a day (TID) | ORAL | 1 refills | Status: AC | PRN

## 2024-03-31 MED ORDER — BUDESON-GLYCOPYRROL-FORMOTEROL 160-9-4.8 MCG/ACT IN AERO: 1.0000 | INHALATION_SPRAY | Freq: Two times a day (BID) | RESPIRATORY_TRACT | 11 refills | Status: AC

## 2024-03-31 NOTE — Progress Notes (Signed)
 Patient ID: Alyssa Phillips, female  DOB: [birth year], 68 y.o.   MRN: [id number] Patient Care Team    Relationship Specialty Notifications Start End  Catherine Charlies LABOR, DO PCP - General Family Medicine  12/04/17   Jordan, Peter M, MD PCP - Cardiology Cardiology  10/30/22   Dyane Rush, MD (Inactive) Consulting Physician Gastroenterology  12/04/17   Melodi Lerner, MD Consulting Physician Orthopedic Surgery  12/04/17   Frutoso Luz, MD Referring Physician Allergy  12/12/19   Jordan, Peter M, MD Consulting Physician Cardiology  12/12/19   Shamleffer, Donell Cardinal, MD Consulting Physician Endocrinology  12/12/19     Chief Complaint  Patient presents with   Annual Exam    Pt is not fasting.  Chronic Conditions/illness Management.     Subjective: Alyssa Phillips is a 68 y.o.  Female  present for CPE and chronic condition management appointment. All past medical history, surgical history, allergies, family history, immunizations, medications and social history were updated in the electronic medical record today. All recent labs, ED visits and hospitalizations within the last year were reviewed.   Health maintenance: Colonoscopy: completed 06/08/2016 , by Dr. Dyane, resutls normal. follow up 10 year.  Due 2028 Mammogram: completed:3/24 2025, birads 1.  BC-Gso> ordered today for 2026 Immunizations: tdap UTD 11/2018, Influenza UTD 2025 (encouraged yearly), PNA completed, shingles completed Infectious disease screening: HIV and Hep C completed DEXA: UTD 02/21/2023, ordered by [facility]-results not received-requested records Patient has a Dental home. Hospitalizations/ED visits: Reviewed  Moderate persistent asthma without complication Patient reports her asthma and allergies have been well-controlled.   She felt Singulair  worked the best for her.  She is compliant with Trelegy.  She is compliant albuterol  as needed, Xyzal  nightly, Singulair  nightly and Nasonex .   Vertigo: She is  prescribed meclizine  and Zofran  for her vertigo.  She has now seen ENT and vestibular rehab.  Her vertigo was from December 2021 to May 2022.  It then resolved with with vestibular rehab.  With occasional reoccurrence.  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 Jordan.  She was found to have severe single-vessel disease but 95% proximal LAD treated with DES x1.  EF 45 to 50%.  Medications are managed by cardiology.   Pt reports compliance with  ASA 81, Lipitor , metoprolol , candesartan .  Patient denies chest pain, shortness of breath, dizziness or lower extremity edema.   Diet: has changed to a heart healthy diet.  RF: HTN, HLD, HD, morbid obesity, CAD/STEMI    Obesity Weight/lbs: 289.5>285>283>277>259 BMI: 44>43.46>43.12>42.57>40.60  Tolerating Wegovy  2.4 mg weekly. No side effects.  Making dietary changes and walking more.  Prior note Patient brings with her her food logs today demonstrate multiple complex carbohydrates including potatoes, breads, pasta, crackers.  She does not count calories.  She drinks approximately 64 ounces of water  a day plus unsweetened iced tea. Currently she does not exercise routinely secondary to muscle skeletal issues. She has a history of myocardial infarction/STEMI, CAD, hypertension and morbid obesity.     03/31/2024    9:00 AM 08/09/2023    7:46 AM 06/26/2023    8:07 AM 03/29/2023    8:14 AM 08/31/2022    9:51 AM  Depression screen PHQ 2/9  Decreased Interest 0 0 0 0 0  Down, Depressed, Hopeless 0 0 0 0 0  PHQ - 2 Score 0 0 0 0 0  Altered sleeping 0 0 1  Tired, decreased energy 0 1 1    Change in appetite 0 0 0    Feeling bad or failure about yourself  0 0 0    Trouble concentrating 0 0 0    Moving slowly or fidgety/restless 0 0 0    Suicidal thoughts 0 0 0    PHQ-9 Score 0 1  2     Difficult doing work/chores Not difficult at all  Not difficult at all Not difficult at all       Data saved with a previous flowsheet row definition        08/31/2022    9:51 AM 03/29/2023    8:13 AM 06/26/2023    8:07 AM 08/09/2023    7:46 AM 03/31/2024    9:00 AM  Fall Risk  Falls in the past year? 0 1 0 1 0  Was there an injury with Fall? 0  1   1  0  Fall Risk Category Calculator 0 3  2 0  Patient at Risk for Falls Due to No Fall Risks History of fall(s)   No Fall Risks  Fall risk Follow up Falls evaluation completed Falls evaluation completed Falls evaluation completed Falls evaluation completed Falls evaluation completed     Data saved with a previous flowsheet row definition    Immunization History  Administered Date(s) Administered   Fluad Quad(high Dose 65+) 02/08/2021, 03/24/2022   Fluad Trivalent(High Dose 65+) 03/29/2023   INFLUENZA, HIGH DOSE SEASONAL PF 01/29/2018, 02/10/2019, 01/02/2024   Influenza Split 05/19/2008, 02/01/2010, 01/31/2011, 01/30/2012   Influenza,inj,Quad PF,6+ Mos 03/10/2015, 12/22/2016, 12/04/2017, 12/09/2018, 12/11/2019   Influenza,inj,Quad PF,6-35 Mos 05/15/2016   Influenza,inj,quad, With Preservative 01/01/2014, 12/18/2016   Influenza-Unspecified 05/15/2016   PFIZER(Purple Top)SARS-COV-2 Vaccination 06/14/2019, 07/09/2019, 11/03/2019, 01/20/2020   PNEUMOCOCCAL CONJUGATE-20 03/24/2022   Pneumococcal Polysaccharide-23 12/04/2017, 01/29/2018, 08/24/2021   Respiratory Syncytial Virus Vaccine,Recomb Aduvanted(Arexvy) 04/27/2022   Tdap 05/13/2010, 12/09/2018   Zoster Recombinant(Shingrix ) 12/11/2019, 08/01/2021    Past Medical History:  Diagnosis Date   Allergic rhinitis    Arthritis    Osteoarthrits-knees-hx. RTKA   Asthma    environmental agents induced asthma   Carpal tunnel syndrome of left wrist 02/19/2020   Carpal tunnel syndrome of right wrist 02/19/2020   Coronary artery disease    Family history of adverse reaction to anesthesia    daughter very sensitive to anesthesia   GERD  (gastroesophageal reflux disease)    Heart murmur    hx. mitral valve proplapse-mostly asymptomatic-->not apprectiaed n echo   Hepatic steatosis 03/05/2008   US    History of kidney stones    Hyperlipidemia    Hypertension    Morbid obesity due to excess calories (HCC)    MRSA infection 08/2022   Pain in joint of left shoulder 06/12/2017   Pain in left foot 04/04/2018   Pain of joint of left ankle and foot 12/18/2019   Pneumonia    Primary hyperparathyroidism    Status post parathyroidectomy 12/27/2022   STEMI (ST elevation myocardial infarction) (HCC) 10/2017   LAD treated with DESx1   UTI (urinary tract infection)    Vitamin D  deficiency    Allergies  Allergen Reactions   Compazine [Prochlorperazine Edisylate] Other (See Comments)    Eyes rolled back, tongue curled up   Advil [Ibuprofen] Hives   Aspirin  Other (See Comments)    HAS to be EC aspirin !!   Past Surgical History:  Procedure Laterality Date   5th finger surgery Right 1990   unknown injury  CARDIAC CATHETERIZATION     CARPAL TUNNEL RELEASE  08/2020   CHOLECYSTECTOMY     COLONOSCOPY WITH PROPOFOL  N/A 06/08/2016   Procedure: COLONOSCOPY WITH PROPOFOL ;  Surgeon: Norleen Hint, MD;  Location: [facility] ENDOSCOPY;  Service: Endoscopy;  Laterality: N/A;   CORONARY STENT INTERVENTION N/A 11/01/2017   Procedure: CORONARY STENT INTERVENTION;  Surgeon: Jordan, Peter M, MD;  Location: [facility] INVASIVE CV LAB;  Service: Cardiovascular;  Laterality: N/A;  Resolute Onyx 4.5 mm x12 mm   CORONARY/GRAFT ACUTE MI REVASCULARIZATION N/A 11/01/2017   Procedure: Coronary/Graft Acute MI Revascularization;  Surgeon: Jordan, Peter M, MD;  Location: [facility] INVASIVE CV LAB;  Service: Cardiovascular;  Laterality: N/A;   CYSTOSCOPY W/ URETERAL STENT PLACEMENT Left 02/06/2022   Procedure: CYSTOSCOPY WITH RETROGRADE PYELOGRAM/URETERAL STENT PLACEMENT;  Surgeon: Lovie Arlyss CROME, MD;  Location: [facility] ORS;  Service: Urology;  Laterality: Left;    CYSTOSCOPY/URETEROSCOPY/HOLMIUM LASER/STENT PLACEMENT Left 02/22/2022   Procedure: LEFT URETEROSCOPY/HOLMIUM LASER/STENT EXCHANGE;  Surgeon: Lovie Arlyss CROME, MD;  Location: [facility] ORS;  Service: Urology;  Laterality: Left;  60 MINUTES NEEDED FOR CASE   I & D EXTREMITY Left 06/30/2021   Procedure: IRRIGATION AND DEBRIDEMENT left elbow;  Surgeon: Alyse Agent, MD;  Location: [facility] OR;  Service: Orthopedics;  Laterality: Left;   KNEE ARTHROSCOPY  05/02/2012   x2 right/ x1 left   LEFT HEART CATH AND CORONARY ANGIOGRAPHY N/A 11/01/2017   Procedure: LEFT HEART CATH AND CORONARY ANGIOGRAPHY;  Surgeon: Jordan, Peter M, MD;  Location: [facility] INVASIVE CV LAB;  Service: Cardiovascular;  Laterality: N/A;   PARATHYROIDECTOMY Right 11/24/2022   Procedure: RIGHT PARATHYROIDECTOMY;  Surgeon: Eletha Boas, MD;  Location: [facility] ORS;  Service: General;  Laterality: Right;  89 2ND SCRUB PERSON   REPLACEMENT TOTAL KNEE Right 10/2010   ROOT CANAL     TOTAL KNEE ARTHROPLASTY  05/10/2012   Procedure: TOTAL KNEE ARTHROPLASTY;  Surgeon: Dempsey LULLA Moan, MD;  Location: [facility] ORS;  Service: Orthopedics;  Laterality: Left;   TUBAL LIGATION     UMBILICAL HERNIA REPAIR     Family History  Problem Relation Age of Onset   Hypertension Mother    Arthritis Mother    Lymphoma Mother    Asthma Mother    Cancer Mother    Hearing loss Mother    Miscarriages / Stillbirths Mother    COPD Father    Alcohol abuse Father    Arthritis Father    Hearing loss Father    Hypertension Father    Miscarriages / Stillbirths Sister    Arthritis Maternal Grandmother    Hearing loss Maternal Grandmother    Breast cancer Maternal Grandmother 6   Arthritis Maternal Grandfather    Diabetes Maternal Grandfather    Hypertension Maternal Grandfather    Stroke Maternal Grandfather    Arthritis Paternal Grandmother    Diabetes Paternal Grandmother    Hearing loss Paternal Grandmother    Hearing loss Paternal Grandfather    Learning disabilities Paternal  Grandfather    Social History   Social History Narrative   Marital status/children/pets: married   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        Medication List        Accurate as of March 31, 2024  9:15 AM. If you have any questions, ask your nurse or doctor.          aspirin  EC 81 MG tablet Take 1 tablet (81 mg total) by mouth daily.   atorvastatin  80 MG tablet Commonly known as: LIPITOR  Take 1 tablet (80 mg total) by mouth daily.   azelastine 0.05 % ophthalmic solution Commonly known as: OPTIVAR Place 1 drop into both eyes daily as needed (itching).   B COMPLEX 1 PO Take 1 tablet by mouth at bedtime.   budesonide -glycopyrrolate -formoterol  160-9-4.8 MCG/ACT Aero inhaler Commonly known as: BREZTRI Inhale 1 puff into the lungs 2 (two) times daily.   candesartan  4 MG tablet Commonly known as: ATACAND  TAKE 1 TABLET BY MOUTH EVERY DAY   cholecalciferol  1000 units tablet Commonly known as: VITAMIN D  Take 1,000 Units by mouth 2 (two) times daily.   CO Q 10 PO Take 1 capsule by mouth daily.   levalbuterol  1.25 MG/3ML nebulizer solution Commonly known as: XOPENEX  Take 1.25 mg by nebulization every 8 (eight) hours as needed for wheezing or shortness of breath.   levocetirizine 5 MG tablet Commonly known as: XYZAL  Take 1 tablet (5 mg total) by mouth every evening.   meclizine  25 MG tablet Commonly known as: ANTIVERT  Take 1 tablet (25 mg total) by mouth 3 (three) times daily as needed for dizziness.   metoprolol  succinate 50 MG 24 hr tablet Commonly known as: TOPROL -XL TAKE 1 TABLET BY MOUTH DAILY. TAKE  WITH OR IMMEDIATELY FOLLOWING A MEAL.   mometasone  50 MCG/ACT nasal spray Commonly known as: NASONEX  Place 2 sprays into the nose every morning.   montelukast  10 MG tablet Commonly known as: SINGULAIR  Take 1 tablet (10 mg total) by mouth at bedtime.   Nebulizer Devi 1 Device by Does not apply route 3 (three) times daily as needed.   nitroGLYCERIN  0.4 MG SL tablet Commonly known as: NITROSTAT  PLACE 1 TABLET UNDER THE TONGUE EVERY 5 MINUTES AS NEEDED FOR CHEST PAIN.   VITAMIN C PO Take 1 tablet by mouth daily.   Wegovy  2.4 MG/0.75ML Soaj SQ injection Generic drug: semaglutide -weight management Inject 2.4 mg into the skin once a week.   ZINC PO Take 1 tablet by mouth at bedtime.        All past medical history, surgical history, allergies, family history, immunizations andmedications were updated in the EMR today and reviewed under the history and medication portions of their EMR.      ROS: 14 pt review of systems performed and negative (unless mentioned in an HPI)  Objective: BP 125/78   Pulse 78   Temp 98.2 F (36.8 C)   Ht 5' 7 (1.702 m)   Wt 259 lb 3.2 oz (117.6 kg)   SpO2 97%   BMI 40.60 kg/m  Physical Exam Vitals and nursing note reviewed.  Constitutional:      General: She is not in acute distress.    Appearance: Normal appearance. She is not ill-appearing, toxic-appearing or diaphoretic.  HENT:     Head: Normocephalic and atraumatic.     Right Ear: Tympanic membrane, ear canal and external ear normal. There is no impacted cerumen.     Left Ear: Tympanic membrane, ear canal and external ear normal. There is no impacted cerumen.     Nose: No  congestion or rhinorrhea.     Mouth/Throat:     Mouth: Mucous membranes are moist.     Pharynx: Oropharynx is clear. No oropharyngeal exudate or posterior oropharyngeal erythema.  Eyes:     General: No scleral icterus.       Right eye: No discharge.        Left eye: No discharge.     Extraocular Movements:  Extraocular movements intact.     Conjunctiva/sclera: Conjunctivae normal.     Pupils: Pupils are equal, round, and reactive to light.  Cardiovascular:     Rate and Rhythm: Normal rate and regular rhythm.     Pulses: Normal pulses.     Heart sounds: Normal heart sounds. No murmur heard.    No friction rub. No gallop.  Pulmonary:     Effort: Pulmonary effort is normal. No respiratory distress.     Breath sounds: Normal breath sounds. No stridor. No wheezing, rhonchi or rales.  Chest:     Chest wall: No tenderness.  Abdominal:     General: Abdomen is flat. Bowel sounds are normal. There is no distension.     Palpations: Abdomen is soft. There is no mass.     Tenderness: There is no abdominal tenderness. There is no right CVA tenderness, left CVA tenderness, guarding or rebound.     Hernia: No hernia is present.  Musculoskeletal:        General: No swelling, tenderness or deformity. Normal range of motion.     Cervical back: Normal range of motion and neck supple. No rigidity or tenderness.     Right lower leg: No edema.     Left lower leg: No edema.  Lymphadenopathy:     Cervical: No cervical adenopathy.  Skin:    General: Skin is warm and dry.     Coloration: Skin is not jaundiced or pale.     Findings: No bruising, erythema, lesion or rash.  Neurological:     General: No focal deficit present.     Mental Status: She is alert and oriented to person, place, and time. Mental status is at baseline.     Cranial Nerves: No cranial nerve deficit.     Sensory: No sensory deficit.     Motor: No weakness.     Coordination: Coordination normal.     Gait: Gait normal.     Deep Tendon Reflexes: Reflexes normal.  Psychiatric:        Mood and Affect: Mood normal.        Behavior: Behavior normal.        Thought Content: Thought content normal.        Judgment: Judgment normal.     No results found.  Assessment/plan: MELODY CIRRINCIONE is a 68 y.o. female present for CPE and combined  chronic condition management appointment Hypertension/mixed hyperlipidemia/STEMI involving left anterior descending coronary artery (HCC)/morbid obesity Routine diet and exercise. Continue follow-ups with cardiology who manages all medications for cardiac continue-metoprolol , candesartan , Lipitor , baby aspirin , nitro   Moderate persistent asthma without complication Stable Continue Xyzal  Continue Singulair  Continue Breztri 1 puff twice daily Prior scripts: Symbicort , Breo, Trelegy, Wixela Continue allergy follow-ups  Hyperparathyroidism (HCC)/hypercalcemia/status post parathyroidectomy 11/2022 Continue routine follow-ups per endocrinology - PTH, calcium  and vitamin D  collected today  Morbid obesity, history of heart attack, CAD, hyperlipidemia, hypertension Patient was counseled on exercise, calorie counting. -Patient was provided with online resources for: Weekly net calorie calculator.  Applications for calorie counting.  Patient was advised to ensure  she is taking in adequate nutrition daily by meeting calorie goals. -Patient was educated on dietary changes to not only lose weight but to eat healthy.  Patient was educated on glycemic index. -Patient was educated on exercise goal of 150 minutes a week (plus warm up and cool down) of cardiovascular exercise.    -Start with 20 minutes 3 times a week and work her way up. -Patient was encouraged to maintain adequate water  consumption of at least 80 ounces a day, more if exercising/sweating. Continue Wegovy  2.4 weekly  Morbid obesity (HCC) Body mass index (BMI) 40.0-44.9, adult (HCC) - Hemoglobin A1c - Lipid panel - Vitamin D  (25 hydroxy) Diabetes mellitus screening/morbid obesity - Hemoglobin A1c Breast cancer screening by mammogram - MM 3D SCREENING MAMMOGRAM BILATERAL BREAST; Future  Routine general medical examination at a health care facility (Primary) Colonoscopy: completed 06/08/2016 , by Dr. Dyane, resutls normal. follow up 10  year.  Due 2028 Mammogram: completed:3/24 2025, birads 1.  BC-Gso> ordered today for 2026 Immunizations: tdap UTD 11/2018, Influenza UTD 2025 (encouraged yearly), PNA completed, shingles completed Infectious disease screening: HIV and Hep C completed DEXA: UTD 02/21/2023, ordered by [facility]-results not received-requested records Patient was encouraged to exercise greater than 150 minutes a week. Patient was encouraged to choose a diet filled with fresh fruits and vegetables, and lean meats. AVS provided to patient today for education/recommendation on gender specific health and safety maintenance.  Return in about 25 weeks (around 09/22/2024) for Routine chronic condition follow-up.   Orders Placed This Encounter  Procedures   MM 3D SCREENING MAMMOGRAM BILATERAL BREAST   CBC   Comprehensive metabolic panel with GFR   Hemoglobin A1c   Lipid panel   TSH   Vitamin D  (25 hydroxy)   PTH, Intact and Calcium    Meds ordered this encounter  Medications   meclizine  (ANTIVERT ) 25 MG tablet    Sig: Take 1 tablet (25 mg total) by mouth 3 (three) times daily as needed for dizziness.    Dispense:  60 tablet    Refill:  1   levocetirizine (XYZAL ) 5 MG tablet    Sig: Take 1 tablet (5 mg total) by mouth every evening.    Dispense:  90 tablet    Refill:  3   montelukast  (SINGULAIR ) 10 MG tablet    Sig: Take 1 tablet (10 mg total) by mouth at bedtime.    Dispense:  90 tablet    Refill:  3   WEGOVY  2.4 MG/0.75ML SOAJ SQ injection    Sig: Inject 2.4 mg into the skin once a week.    Dispense:  3 mL    Refill:  5   budesonide -glycopyrrolate -formoterol  (BREZTRI) 160-9-4.8 MCG/ACT AERO inhaler    Sig: Inhale 1 puff into the lungs 2 (two) times daily.    Dispense:  10.7 g    Refill:  11   Referral Orders  No referral(s) requested today     Electronically signed by: Charlies Bellini, DO [facility] [place]

## 2024-03-31 NOTE — Patient Instructions (Signed)
 Return in about 25 weeks (around 09/22/2024) for Routine chronic condition follow-up.        Great to see you today.  I have refilled the medication(s) we provide.   If labs were collected or images ordered, we will inform you of  results once we have received them and reviewed. We will contact you either by echart message, or telephone call.  Please give ample time to the testing facility, and our office to run,  receive and review results. Please do not call inquiring of results, even if you can see them in your chart. We will contact you as soon as we are able. If it has been over 1 week since the test was completed, and you have not yet heard from us , then please call us .    - echart message- for normal results that have been seen by the patient already.   - telephone call: abnormal results or if patient has not viewed results in their echart.  If a referral to a specialist was entered for you, please call us  in 2 weeks if you have not heard from the specialist office to schedule.

## 2024-04-01 LAB — PTH, INTACT AND CALCIUM
Calcium: 9.1 mg/dL (ref 8.6–10.4)
PTH: 19 pg/mL (ref 16–77)

## 2024-04-04 ENCOUNTER — Other Ambulatory Visit: Payer: Self-pay | Admitting: Cardiology

## 2024-04-16 ENCOUNTER — Other Ambulatory Visit: Payer: Self-pay | Admitting: Cardiology

## 2024-05-02 ENCOUNTER — Encounter: Admitting: Family Medicine

## 2024-05-04 ENCOUNTER — Other Ambulatory Visit: Payer: Self-pay | Admitting: Cardiology

## 2024-05-07 NOTE — Telephone Encounter (Signed)
 Lipid Panel completed on 03/31/24

## 2024-05-15 ENCOUNTER — Other Ambulatory Visit: Payer: Self-pay | Admitting: Cardiology

## 2024-05-21 ENCOUNTER — Encounter: Admitting: Family Medicine

## 2024-07-10 ENCOUNTER — Ambulatory Visit

## 2024-09-24 ENCOUNTER — Ambulatory Visit: Admitting: Family Medicine
